# Patient Record
Sex: Female | Born: 1950 | Race: Black or African American | Hispanic: No | Smoking: Former smoker
Health system: Southern US, Community
[De-identification: ages and names within clinical notes are randomized; demographics above are authoritative.]

## PROBLEM LIST (undated history)

## (undated) DIAGNOSIS — M47816 Spondylosis without myelopathy or radiculopathy, lumbar region: Secondary | ICD-10-CM

## (undated) DIAGNOSIS — I4819 Other persistent atrial fibrillation: Secondary | ICD-10-CM

## (undated) DIAGNOSIS — K259 Gastric ulcer, unspecified as acute or chronic, without hemorrhage or perforation: Secondary | ICD-10-CM

## (undated) DIAGNOSIS — K635 Polyp of colon: Secondary | ICD-10-CM

## (undated) DIAGNOSIS — E785 Hyperlipidemia, unspecified: Secondary | ICD-10-CM

## (undated) DIAGNOSIS — K5790 Diverticulosis of intestine, part unspecified, without perforation or abscess without bleeding: Secondary | ICD-10-CM

## (undated) DIAGNOSIS — K529 Noninfective gastroenteritis and colitis, unspecified: Secondary | ICD-10-CM

## (undated) DIAGNOSIS — N281 Cyst of kidney, acquired: Secondary | ICD-10-CM

## (undated) DIAGNOSIS — E669 Obesity, unspecified: Secondary | ICD-10-CM

## (undated) DIAGNOSIS — I2510 Atherosclerotic heart disease of native coronary artery without angina pectoris: Secondary | ICD-10-CM

## (undated) DIAGNOSIS — D509 Iron deficiency anemia, unspecified: Secondary | ICD-10-CM

## (undated) DIAGNOSIS — I10 Essential (primary) hypertension: Secondary | ICD-10-CM

## (undated) DIAGNOSIS — M1990 Unspecified osteoarthritis, unspecified site: Secondary | ICD-10-CM

## (undated) DIAGNOSIS — K439 Ventral hernia without obstruction or gangrene: Secondary | ICD-10-CM

## (undated) DIAGNOSIS — I499 Cardiac arrhythmia, unspecified: Secondary | ICD-10-CM

## (undated) HISTORY — DX: Cyst of kidney, acquired: N28.1

## (undated) HISTORY — DX: Ventral hernia without obstruction or gangrene: K43.9

## (undated) HISTORY — DX: Hyperlipidemia, unspecified: E78.5

## (undated) HISTORY — PX: OPERATIVE HYSTEROSCOPY: SUR664

## (undated) HISTORY — DX: Atherosclerotic heart disease of native coronary artery without angina pectoris: I25.10

## (undated) HISTORY — DX: Gastric ulcer, unspecified as acute or chronic, without hemorrhage or perforation: K25.9

## (undated) HISTORY — DX: Polyp of colon: K63.5

## (undated) HISTORY — DX: Diverticulosis of intestine, part unspecified, without perforation or abscess without bleeding: K57.90

## (undated) HISTORY — DX: Noninfective gastroenteritis and colitis, unspecified: K52.9

## (undated) HISTORY — DX: Essential (primary) hypertension: I10

## (undated) HISTORY — DX: Iron deficiency anemia, unspecified: D50.9

## (undated) HISTORY — DX: Unspecified osteoarthritis, unspecified site: M19.90

## (undated) HISTORY — DX: Spondylosis without myelopathy or radiculopathy, lumbar region: M47.816

## (undated) HISTORY — PX: UTERINE FIBROID SURGERY: SHX826

## (undated) HISTORY — PX: TUBAL LIGATION: SHX77

## (undated) HISTORY — DX: Other persistent atrial fibrillation: I48.19

## (undated) HISTORY — PX: ABDOMINAL HYSTERECTOMY: SHX81

---

## 1998-03-21 ENCOUNTER — Encounter: Admission: RE | Admit: 1998-03-21 | Discharge: 1998-03-21 | Payer: Self-pay | Admitting: Family Medicine

## 1998-04-19 ENCOUNTER — Encounter: Admission: RE | Admit: 1998-04-19 | Discharge: 1998-04-19 | Payer: Self-pay | Admitting: Family Medicine

## 1998-04-26 ENCOUNTER — Encounter: Admission: RE | Admit: 1998-04-26 | Discharge: 1998-04-26 | Payer: Self-pay | Admitting: Family Medicine

## 1998-05-17 ENCOUNTER — Encounter: Admission: RE | Admit: 1998-05-17 | Discharge: 1998-05-17 | Payer: Self-pay | Admitting: Family Medicine

## 1998-06-29 ENCOUNTER — Encounter: Admission: RE | Admit: 1998-06-29 | Discharge: 1998-06-29 | Payer: Self-pay | Admitting: Family Medicine

## 1998-06-29 ENCOUNTER — Other Ambulatory Visit: Admission: RE | Admit: 1998-06-29 | Discharge: 1998-06-29 | Payer: Self-pay | Admitting: *Deleted

## 1998-06-30 ENCOUNTER — Encounter: Admission: RE | Admit: 1998-06-30 | Discharge: 1998-06-30 | Payer: Self-pay | Admitting: Sports Medicine

## 1998-07-03 ENCOUNTER — Encounter: Admission: RE | Admit: 1998-07-03 | Discharge: 1998-07-03 | Payer: Self-pay | Admitting: Family Medicine

## 1998-08-21 ENCOUNTER — Encounter: Admission: RE | Admit: 1998-08-21 | Discharge: 1998-08-21 | Payer: Self-pay | Admitting: Family Medicine

## 1999-03-03 ENCOUNTER — Emergency Department (HOSPITAL_COMMUNITY): Admission: EM | Admit: 1999-03-03 | Discharge: 1999-03-04 | Payer: Self-pay | Admitting: Emergency Medicine

## 1999-03-16 ENCOUNTER — Encounter: Admission: RE | Admit: 1999-03-16 | Discharge: 1999-03-16 | Payer: Self-pay | Admitting: Family Medicine

## 1999-04-06 ENCOUNTER — Encounter: Admission: RE | Admit: 1999-04-06 | Discharge: 1999-04-06 | Payer: Self-pay | Admitting: Family Medicine

## 1999-04-27 ENCOUNTER — Encounter: Admission: RE | Admit: 1999-04-27 | Discharge: 1999-04-27 | Payer: Self-pay | Admitting: Family Medicine

## 1999-05-23 ENCOUNTER — Encounter: Admission: RE | Admit: 1999-05-23 | Discharge: 1999-05-23 | Payer: Self-pay | Admitting: Family Medicine

## 1999-07-18 ENCOUNTER — Encounter: Admission: RE | Admit: 1999-07-18 | Discharge: 1999-07-18 | Payer: Self-pay | Admitting: Family Medicine

## 1999-08-17 ENCOUNTER — Encounter: Admission: RE | Admit: 1999-08-17 | Discharge: 1999-08-17 | Payer: Self-pay | Admitting: Family Medicine

## 1999-09-07 ENCOUNTER — Encounter: Admission: RE | Admit: 1999-09-07 | Discharge: 1999-09-07 | Payer: Self-pay | Admitting: Family Medicine

## 1999-11-16 ENCOUNTER — Encounter: Admission: RE | Admit: 1999-11-16 | Discharge: 1999-11-16 | Payer: Self-pay | Admitting: Family Medicine

## 1999-12-07 ENCOUNTER — Encounter: Admission: RE | Admit: 1999-12-07 | Discharge: 1999-12-07 | Payer: Self-pay | Admitting: Family Medicine

## 1999-12-07 ENCOUNTER — Ambulatory Visit (HOSPITAL_COMMUNITY): Admission: RE | Admit: 1999-12-07 | Discharge: 1999-12-07 | Payer: Self-pay | Admitting: Family Medicine

## 1999-12-21 ENCOUNTER — Encounter: Admission: RE | Admit: 1999-12-21 | Discharge: 1999-12-21 | Payer: Self-pay | Admitting: Family Medicine

## 2000-04-03 ENCOUNTER — Encounter: Admission: RE | Admit: 2000-04-03 | Discharge: 2000-04-03 | Payer: Self-pay | Admitting: Sports Medicine

## 2000-04-03 ENCOUNTER — Encounter: Admission: RE | Admit: 2000-04-03 | Discharge: 2000-04-03 | Payer: Self-pay | Admitting: Family Medicine

## 2000-04-06 ENCOUNTER — Encounter: Payer: Self-pay | Admitting: Family Medicine

## 2000-04-06 ENCOUNTER — Encounter: Payer: Self-pay | Admitting: Emergency Medicine

## 2000-04-06 ENCOUNTER — Inpatient Hospital Stay (HOSPITAL_COMMUNITY): Admission: EM | Admit: 2000-04-06 | Discharge: 2000-04-09 | Payer: Self-pay | Admitting: Emergency Medicine

## 2000-04-08 DIAGNOSIS — K259 Gastric ulcer, unspecified as acute or chronic, without hemorrhage or perforation: Secondary | ICD-10-CM

## 2000-04-08 HISTORY — DX: Gastric ulcer, unspecified as acute or chronic, without hemorrhage or perforation: K25.9

## 2000-04-11 ENCOUNTER — Encounter: Admission: RE | Admit: 2000-04-11 | Discharge: 2000-07-10 | Payer: Self-pay | Admitting: Emergency Medicine

## 2000-04-17 ENCOUNTER — Encounter: Admission: RE | Admit: 2000-04-17 | Discharge: 2000-04-17 | Payer: Self-pay | Admitting: Family Medicine

## 2000-04-18 ENCOUNTER — Encounter: Admission: RE | Admit: 2000-04-18 | Discharge: 2000-04-18 | Payer: Self-pay | Admitting: Family Medicine

## 2000-04-21 ENCOUNTER — Encounter: Admission: RE | Admit: 2000-04-21 | Discharge: 2000-04-21 | Payer: Self-pay | Admitting: Family Medicine

## 2000-04-21 ENCOUNTER — Emergency Department (HOSPITAL_COMMUNITY): Admission: EM | Admit: 2000-04-21 | Discharge: 2000-04-21 | Payer: Self-pay | Admitting: Emergency Medicine

## 2000-04-22 ENCOUNTER — Encounter: Admission: RE | Admit: 2000-04-22 | Discharge: 2000-04-22 | Payer: Self-pay | Admitting: Family Medicine

## 2000-04-23 ENCOUNTER — Encounter: Admission: RE | Admit: 2000-04-23 | Discharge: 2000-04-23 | Payer: Self-pay | Admitting: Family Medicine

## 2000-04-23 ENCOUNTER — Emergency Department (HOSPITAL_COMMUNITY): Admission: EM | Admit: 2000-04-23 | Discharge: 2000-04-23 | Payer: Self-pay | Admitting: Emergency Medicine

## 2000-04-28 ENCOUNTER — Encounter: Payer: Self-pay | Admitting: Emergency Medicine

## 2000-04-28 ENCOUNTER — Inpatient Hospital Stay (HOSPITAL_COMMUNITY): Admission: EM | Admit: 2000-04-28 | Discharge: 2000-04-29 | Payer: Self-pay | Admitting: Emergency Medicine

## 2000-04-28 ENCOUNTER — Encounter (INDEPENDENT_AMBULATORY_CARE_PROVIDER_SITE_OTHER): Payer: Self-pay | Admitting: *Deleted

## 2000-04-29 ENCOUNTER — Encounter: Payer: Self-pay | Admitting: Internal Medicine

## 2000-05-01 ENCOUNTER — Encounter: Admission: RE | Admit: 2000-05-01 | Discharge: 2000-05-01 | Payer: Self-pay | Admitting: Sports Medicine

## 2000-05-01 ENCOUNTER — Encounter: Payer: Self-pay | Admitting: Sports Medicine

## 2000-05-07 ENCOUNTER — Encounter: Admission: RE | Admit: 2000-05-07 | Discharge: 2000-05-07 | Payer: Self-pay | Admitting: Family Medicine

## 2000-05-13 ENCOUNTER — Encounter: Admission: RE | Admit: 2000-05-13 | Discharge: 2000-05-13 | Payer: Self-pay | Admitting: Family Medicine

## 2000-05-20 ENCOUNTER — Encounter: Admission: RE | Admit: 2000-05-20 | Discharge: 2000-05-20 | Payer: Self-pay | Admitting: Family Medicine

## 2000-05-29 ENCOUNTER — Encounter: Admission: RE | Admit: 2000-05-29 | Discharge: 2000-05-29 | Payer: Self-pay | Admitting: Family Medicine

## 2000-06-12 ENCOUNTER — Encounter: Admission: RE | Admit: 2000-06-12 | Discharge: 2000-06-12 | Payer: Self-pay | Admitting: Family Medicine

## 2000-07-09 ENCOUNTER — Encounter (INDEPENDENT_AMBULATORY_CARE_PROVIDER_SITE_OTHER): Payer: Self-pay | Admitting: *Deleted

## 2000-07-15 ENCOUNTER — Encounter: Admission: RE | Admit: 2000-07-15 | Discharge: 2000-07-15 | Payer: Self-pay | Admitting: Family Medicine

## 2000-09-21 ENCOUNTER — Encounter: Payer: Self-pay | Admitting: Emergency Medicine

## 2000-09-21 ENCOUNTER — Inpatient Hospital Stay (HOSPITAL_COMMUNITY): Admission: EM | Admit: 2000-09-21 | Discharge: 2000-09-22 | Payer: Self-pay | Admitting: Emergency Medicine

## 2000-11-19 ENCOUNTER — Encounter: Admission: RE | Admit: 2000-11-19 | Discharge: 2000-11-19 | Payer: Self-pay | Admitting: Family Medicine

## 2001-01-27 ENCOUNTER — Encounter: Admission: RE | Admit: 2001-01-27 | Discharge: 2001-01-27 | Payer: Self-pay | Admitting: Family Medicine

## 2001-04-14 ENCOUNTER — Encounter: Admission: RE | Admit: 2001-04-14 | Discharge: 2001-04-14 | Payer: Self-pay | Admitting: Family Medicine

## 2001-05-13 ENCOUNTER — Encounter: Admission: RE | Admit: 2001-05-13 | Discharge: 2001-05-13 | Payer: Self-pay | Admitting: Family Medicine

## 2001-05-14 ENCOUNTER — Encounter: Payer: Self-pay | Admitting: Sports Medicine

## 2001-05-14 ENCOUNTER — Encounter: Admission: RE | Admit: 2001-05-14 | Discharge: 2001-05-14 | Payer: Self-pay | Admitting: Sports Medicine

## 2001-05-19 ENCOUNTER — Encounter: Admission: RE | Admit: 2001-05-19 | Discharge: 2001-05-19 | Payer: Self-pay | Admitting: Sports Medicine

## 2001-05-19 ENCOUNTER — Encounter: Payer: Self-pay | Admitting: Sports Medicine

## 2001-05-22 ENCOUNTER — Encounter: Admission: RE | Admit: 2001-05-22 | Discharge: 2001-05-22 | Payer: Self-pay | Admitting: Family Medicine

## 2001-05-27 ENCOUNTER — Encounter: Payer: Self-pay | Admitting: Family Medicine

## 2001-05-27 ENCOUNTER — Encounter: Admission: RE | Admit: 2001-05-27 | Discharge: 2001-05-27 | Payer: Self-pay | Admitting: Family Medicine

## 2001-06-16 ENCOUNTER — Encounter: Admission: RE | Admit: 2001-06-16 | Discharge: 2001-06-16 | Payer: Self-pay | Admitting: Sports Medicine

## 2001-07-08 ENCOUNTER — Other Ambulatory Visit: Admission: RE | Admit: 2001-07-08 | Discharge: 2001-07-08 | Payer: Self-pay | Admitting: *Deleted

## 2001-07-19 ENCOUNTER — Encounter (INDEPENDENT_AMBULATORY_CARE_PROVIDER_SITE_OTHER): Payer: Self-pay | Admitting: *Deleted

## 2001-07-19 ENCOUNTER — Emergency Department (HOSPITAL_COMMUNITY): Admission: EM | Admit: 2001-07-19 | Discharge: 2001-07-20 | Payer: Self-pay | Admitting: Emergency Medicine

## 2001-07-31 ENCOUNTER — Encounter: Admission: RE | Admit: 2001-07-31 | Discharge: 2001-07-31 | Payer: Self-pay | Admitting: Family Medicine

## 2001-08-02 ENCOUNTER — Emergency Department (HOSPITAL_COMMUNITY): Admission: EM | Admit: 2001-08-02 | Discharge: 2001-08-02 | Payer: Self-pay | Admitting: Emergency Medicine

## 2001-08-05 ENCOUNTER — Encounter (INDEPENDENT_AMBULATORY_CARE_PROVIDER_SITE_OTHER): Payer: Self-pay | Admitting: *Deleted

## 2001-08-05 ENCOUNTER — Encounter: Admission: RE | Admit: 2001-08-05 | Discharge: 2001-08-05 | Payer: Self-pay | Admitting: Family Medicine

## 2001-08-05 ENCOUNTER — Encounter: Payer: Self-pay | Admitting: Family Medicine

## 2001-09-14 ENCOUNTER — Emergency Department (HOSPITAL_COMMUNITY): Admission: EM | Admit: 2001-09-14 | Discharge: 2001-09-15 | Payer: Self-pay | Admitting: *Deleted

## 2001-09-14 ENCOUNTER — Encounter: Payer: Self-pay | Admitting: Emergency Medicine

## 2001-09-14 ENCOUNTER — Encounter (INDEPENDENT_AMBULATORY_CARE_PROVIDER_SITE_OTHER): Payer: Self-pay | Admitting: *Deleted

## 2001-10-16 ENCOUNTER — Encounter: Admission: RE | Admit: 2001-10-16 | Discharge: 2001-10-16 | Payer: Self-pay | Admitting: Family Medicine

## 2001-11-03 ENCOUNTER — Encounter: Admission: RE | Admit: 2001-11-03 | Discharge: 2001-11-03 | Payer: Self-pay | Admitting: Pediatrics

## 2001-11-10 ENCOUNTER — Encounter: Admission: RE | Admit: 2001-11-10 | Discharge: 2001-11-10 | Payer: Self-pay | Admitting: Family Medicine

## 2001-12-07 ENCOUNTER — Inpatient Hospital Stay (HOSPITAL_COMMUNITY): Admission: AD | Admit: 2001-12-07 | Discharge: 2001-12-07 | Payer: Self-pay | Admitting: Obstetrics & Gynecology

## 2001-12-07 ENCOUNTER — Encounter: Admission: RE | Admit: 2001-12-07 | Discharge: 2001-12-07 | Payer: Self-pay | Admitting: Family Medicine

## 2001-12-10 ENCOUNTER — Encounter: Admission: RE | Admit: 2001-12-10 | Discharge: 2001-12-10 | Payer: Self-pay | Admitting: Family Medicine

## 2001-12-24 ENCOUNTER — Encounter: Admission: RE | Admit: 2001-12-24 | Discharge: 2001-12-24 | Payer: Self-pay | Admitting: Family Medicine

## 2002-01-25 ENCOUNTER — Encounter: Admission: RE | Admit: 2002-01-25 | Discharge: 2002-01-25 | Payer: Self-pay | Admitting: Family Medicine

## 2002-02-25 ENCOUNTER — Encounter: Admission: RE | Admit: 2002-02-25 | Discharge: 2002-02-25 | Payer: Self-pay | Admitting: Family Medicine

## 2002-03-04 ENCOUNTER — Encounter: Admission: RE | Admit: 2002-03-04 | Discharge: 2002-03-04 | Payer: Self-pay | Admitting: Family Medicine

## 2002-05-13 ENCOUNTER — Encounter: Admission: RE | Admit: 2002-05-13 | Discharge: 2002-05-13 | Payer: Self-pay | Admitting: Family Medicine

## 2002-05-28 ENCOUNTER — Encounter: Payer: Self-pay | Admitting: Sports Medicine

## 2002-05-28 ENCOUNTER — Encounter: Admission: RE | Admit: 2002-05-28 | Discharge: 2002-05-28 | Payer: Self-pay | Admitting: Sports Medicine

## 2002-06-16 ENCOUNTER — Encounter (INDEPENDENT_AMBULATORY_CARE_PROVIDER_SITE_OTHER): Payer: Self-pay | Admitting: *Deleted

## 2002-06-16 ENCOUNTER — Inpatient Hospital Stay (HOSPITAL_COMMUNITY): Admission: EM | Admit: 2002-06-16 | Discharge: 2002-06-19 | Payer: Self-pay | Admitting: Emergency Medicine

## 2002-06-16 ENCOUNTER — Encounter: Payer: Self-pay | Admitting: Emergency Medicine

## 2002-06-18 ENCOUNTER — Encounter: Payer: Self-pay | Admitting: Family Medicine

## 2002-07-05 ENCOUNTER — Encounter: Admission: RE | Admit: 2002-07-05 | Discharge: 2002-07-05 | Payer: Self-pay | Admitting: Family Medicine

## 2002-07-09 ENCOUNTER — Encounter: Admission: RE | Admit: 2002-07-09 | Discharge: 2002-07-09 | Payer: Self-pay | Admitting: Sports Medicine

## 2002-07-09 ENCOUNTER — Encounter: Payer: Self-pay | Admitting: Sports Medicine

## 2002-09-28 ENCOUNTER — Encounter: Admission: RE | Admit: 2002-09-28 | Discharge: 2002-09-28 | Payer: Self-pay | Admitting: Sports Medicine

## 2002-10-27 ENCOUNTER — Encounter: Admission: RE | Admit: 2002-10-27 | Discharge: 2002-10-27 | Payer: Self-pay | Admitting: Family Medicine

## 2002-10-29 ENCOUNTER — Emergency Department (HOSPITAL_COMMUNITY): Admission: EM | Admit: 2002-10-29 | Discharge: 2002-10-29 | Payer: Self-pay | Admitting: Emergency Medicine

## 2002-10-31 ENCOUNTER — Emergency Department (HOSPITAL_COMMUNITY): Admission: EM | Admit: 2002-10-31 | Discharge: 2002-10-31 | Payer: Self-pay | Admitting: Emergency Medicine

## 2002-10-31 ENCOUNTER — Encounter: Payer: Self-pay | Admitting: Emergency Medicine

## 2002-11-09 ENCOUNTER — Encounter: Admission: RE | Admit: 2002-11-09 | Discharge: 2002-11-09 | Payer: Self-pay | Admitting: Sports Medicine

## 2002-11-16 ENCOUNTER — Encounter: Admission: RE | Admit: 2002-11-16 | Discharge: 2002-11-16 | Payer: Self-pay | Admitting: Sports Medicine

## 2002-11-23 ENCOUNTER — Encounter: Admission: RE | Admit: 2002-11-23 | Discharge: 2002-11-23 | Payer: Self-pay | Admitting: Family Medicine

## 2002-11-26 ENCOUNTER — Encounter: Payer: Self-pay | Admitting: Emergency Medicine

## 2002-11-26 ENCOUNTER — Emergency Department (HOSPITAL_COMMUNITY): Admission: EM | Admit: 2002-11-26 | Discharge: 2002-11-26 | Payer: Self-pay | Admitting: Emergency Medicine

## 2002-12-10 ENCOUNTER — Encounter: Admission: RE | Admit: 2002-12-10 | Discharge: 2002-12-10 | Payer: Self-pay | Admitting: Sports Medicine

## 2002-12-23 ENCOUNTER — Encounter: Admission: RE | Admit: 2002-12-23 | Discharge: 2002-12-23 | Payer: Self-pay | Admitting: Family Medicine

## 2002-12-30 ENCOUNTER — Encounter: Admission: RE | Admit: 2002-12-30 | Discharge: 2002-12-30 | Payer: Self-pay | Admitting: Family Medicine

## 2003-01-30 ENCOUNTER — Emergency Department (HOSPITAL_COMMUNITY): Admission: EM | Admit: 2003-01-30 | Discharge: 2003-01-30 | Payer: Self-pay | Admitting: Emergency Medicine

## 2003-03-11 ENCOUNTER — Encounter: Admission: RE | Admit: 2003-03-11 | Discharge: 2003-03-11 | Payer: Self-pay | Admitting: Family Medicine

## 2003-04-08 ENCOUNTER — Encounter: Admission: RE | Admit: 2003-04-08 | Discharge: 2003-04-08 | Payer: Self-pay | Admitting: Family Medicine

## 2003-04-22 ENCOUNTER — Encounter: Admission: RE | Admit: 2003-04-22 | Discharge: 2003-04-22 | Payer: Self-pay | Admitting: Family Medicine

## 2003-05-06 ENCOUNTER — Encounter: Admission: RE | Admit: 2003-05-06 | Discharge: 2003-05-06 | Payer: Self-pay | Admitting: Family Medicine

## 2003-05-16 ENCOUNTER — Encounter: Admission: RE | Admit: 2003-05-16 | Discharge: 2003-05-16 | Payer: Self-pay | Admitting: Sports Medicine

## 2003-06-03 ENCOUNTER — Encounter: Payer: Self-pay | Admitting: Sports Medicine

## 2003-06-03 ENCOUNTER — Encounter: Admission: RE | Admit: 2003-06-03 | Discharge: 2003-06-03 | Payer: Self-pay | Admitting: Sports Medicine

## 2003-10-15 ENCOUNTER — Emergency Department (HOSPITAL_COMMUNITY): Admission: EM | Admit: 2003-10-15 | Discharge: 2003-10-15 | Payer: Self-pay | Admitting: Emergency Medicine

## 2003-11-12 ENCOUNTER — Emergency Department (HOSPITAL_COMMUNITY): Admission: AD | Admit: 2003-11-12 | Discharge: 2003-11-12 | Payer: Self-pay | Admitting: Family Medicine

## 2003-11-25 ENCOUNTER — Encounter: Admission: RE | Admit: 2003-11-25 | Discharge: 2003-11-25 | Payer: Self-pay | Admitting: Sports Medicine

## 2003-12-16 ENCOUNTER — Encounter: Admission: RE | Admit: 2003-12-16 | Discharge: 2003-12-16 | Payer: Self-pay | Admitting: Family Medicine

## 2004-03-08 ENCOUNTER — Encounter: Admission: RE | Admit: 2004-03-08 | Discharge: 2004-03-08 | Payer: Self-pay | Admitting: Family Medicine

## 2004-05-12 ENCOUNTER — Emergency Department (HOSPITAL_COMMUNITY): Admission: EM | Admit: 2004-05-12 | Discharge: 2004-05-12 | Payer: Self-pay | Admitting: Emergency Medicine

## 2004-07-16 ENCOUNTER — Encounter: Admission: RE | Admit: 2004-07-16 | Discharge: 2004-07-16 | Payer: Self-pay | Admitting: Family Medicine

## 2004-07-25 ENCOUNTER — Encounter: Admission: RE | Admit: 2004-07-25 | Discharge: 2004-07-25 | Payer: Self-pay | Admitting: Sports Medicine

## 2004-08-08 ENCOUNTER — Encounter: Admission: RE | Admit: 2004-08-08 | Discharge: 2004-08-08 | Payer: Self-pay | Admitting: Family Medicine

## 2004-08-20 ENCOUNTER — Encounter: Admission: RE | Admit: 2004-08-20 | Discharge: 2004-08-20 | Payer: Self-pay | Admitting: Sports Medicine

## 2004-09-05 ENCOUNTER — Ambulatory Visit: Payer: Self-pay | Admitting: Sports Medicine

## 2004-09-12 ENCOUNTER — Inpatient Hospital Stay (HOSPITAL_COMMUNITY): Admission: EM | Admit: 2004-09-12 | Discharge: 2004-09-14 | Payer: Self-pay | Admitting: Emergency Medicine

## 2004-09-26 ENCOUNTER — Emergency Department (HOSPITAL_COMMUNITY): Admission: EM | Admit: 2004-09-26 | Discharge: 2004-09-27 | Payer: Self-pay | Admitting: Emergency Medicine

## 2004-10-04 ENCOUNTER — Ambulatory Visit: Payer: Self-pay | Admitting: Family Medicine

## 2004-10-18 ENCOUNTER — Ambulatory Visit: Payer: Self-pay | Admitting: Family Medicine

## 2004-10-30 ENCOUNTER — Ambulatory Visit: Payer: Self-pay | Admitting: Family Medicine

## 2004-11-16 ENCOUNTER — Ambulatory Visit: Payer: Self-pay | Admitting: Sports Medicine

## 2004-12-20 ENCOUNTER — Ambulatory Visit: Payer: Self-pay | Admitting: Family Medicine

## 2005-02-04 ENCOUNTER — Ambulatory Visit: Payer: Self-pay | Admitting: Sports Medicine

## 2005-03-09 ENCOUNTER — Emergency Department (HOSPITAL_COMMUNITY): Admission: EM | Admit: 2005-03-09 | Discharge: 2005-03-09 | Payer: Self-pay | Admitting: Emergency Medicine

## 2005-04-12 ENCOUNTER — Ambulatory Visit: Payer: Self-pay | Admitting: Family Medicine

## 2005-05-15 ENCOUNTER — Ambulatory Visit: Payer: Self-pay | Admitting: Family Medicine

## 2005-06-25 ENCOUNTER — Ambulatory Visit: Payer: Self-pay | Admitting: Family Medicine

## 2005-07-11 ENCOUNTER — Emergency Department (HOSPITAL_COMMUNITY): Admission: EM | Admit: 2005-07-11 | Discharge: 2005-07-11 | Payer: Self-pay | Admitting: Emergency Medicine

## 2005-10-18 ENCOUNTER — Ambulatory Visit: Payer: Self-pay | Admitting: Family Medicine

## 2005-11-04 ENCOUNTER — Encounter: Admission: RE | Admit: 2005-11-04 | Discharge: 2005-11-04 | Payer: Self-pay | Admitting: Sports Medicine

## 2006-03-10 ENCOUNTER — Ambulatory Visit: Payer: Self-pay | Admitting: Sports Medicine

## 2006-04-11 ENCOUNTER — Ambulatory Visit: Payer: Self-pay | Admitting: Family Medicine

## 2006-04-16 ENCOUNTER — Ambulatory Visit: Payer: Self-pay | Admitting: Sports Medicine

## 2006-05-22 ENCOUNTER — Encounter: Admission: RE | Admit: 2006-05-22 | Discharge: 2006-05-22 | Payer: Self-pay | Admitting: Family Medicine

## 2006-08-24 ENCOUNTER — Emergency Department (HOSPITAL_COMMUNITY): Admission: EM | Admit: 2006-08-24 | Discharge: 2006-08-24 | Payer: Self-pay | Admitting: Emergency Medicine

## 2006-11-07 ENCOUNTER — Encounter: Admission: RE | Admit: 2006-11-07 | Discharge: 2006-11-07 | Payer: Self-pay | Admitting: Sports Medicine

## 2007-01-09 ENCOUNTER — Ambulatory Visit: Payer: Self-pay | Admitting: Family Medicine

## 2007-01-30 ENCOUNTER — Ambulatory Visit: Payer: Self-pay | Admitting: Family Medicine

## 2007-02-05 DIAGNOSIS — K219 Gastro-esophageal reflux disease without esophagitis: Secondary | ICD-10-CM

## 2007-02-05 DIAGNOSIS — I10 Essential (primary) hypertension: Secondary | ICD-10-CM | POA: Insufficient documentation

## 2007-02-05 DIAGNOSIS — E669 Obesity, unspecified: Secondary | ICD-10-CM

## 2007-02-05 DIAGNOSIS — E7800 Pure hypercholesterolemia, unspecified: Secondary | ICD-10-CM

## 2007-02-05 DIAGNOSIS — D509 Iron deficiency anemia, unspecified: Secondary | ICD-10-CM

## 2007-02-05 DIAGNOSIS — M1990 Unspecified osteoarthritis, unspecified site: Secondary | ICD-10-CM

## 2007-02-05 DIAGNOSIS — F339 Major depressive disorder, recurrent, unspecified: Secondary | ICD-10-CM | POA: Insufficient documentation

## 2007-02-05 DIAGNOSIS — E119 Type 2 diabetes mellitus without complications: Secondary | ICD-10-CM | POA: Insufficient documentation

## 2007-02-05 HISTORY — DX: Iron deficiency anemia, unspecified: D50.9

## 2007-02-06 ENCOUNTER — Encounter (INDEPENDENT_AMBULATORY_CARE_PROVIDER_SITE_OTHER): Payer: Self-pay | Admitting: *Deleted

## 2007-03-13 ENCOUNTER — Ambulatory Visit: Payer: Self-pay | Admitting: Family Medicine

## 2007-04-24 ENCOUNTER — Ambulatory Visit: Payer: Self-pay | Admitting: Family Medicine

## 2007-05-21 ENCOUNTER — Encounter: Payer: Self-pay | Admitting: Family Medicine

## 2007-05-23 ENCOUNTER — Emergency Department (HOSPITAL_COMMUNITY): Admission: EM | Admit: 2007-05-23 | Discharge: 2007-05-23 | Payer: Self-pay | Admitting: Emergency Medicine

## 2007-05-25 ENCOUNTER — Encounter: Payer: Self-pay | Admitting: *Deleted

## 2007-09-11 ENCOUNTER — Telehealth: Payer: Self-pay | Admitting: *Deleted

## 2007-12-11 ENCOUNTER — Ambulatory Visit: Payer: Self-pay | Admitting: Family Medicine

## 2007-12-11 ENCOUNTER — Encounter: Payer: Self-pay | Admitting: Family Medicine

## 2007-12-11 LAB — CONVERTED CEMR LAB
ALT: 10 units/L (ref 0–35)
AST: 10 units/L (ref 0–37)
Alkaline Phosphatase: 57 units/L (ref 39–117)
Bilirubin Urine: NEGATIVE
CO2: 28 meq/L (ref 19–32)
Calcium: 9.8 mg/dL (ref 8.4–10.5)
Glucose, Bld: 258 mg/dL — ABNORMAL HIGH (ref 70–99)
HDL: 55 mg/dL (ref >39)
Ketones, urine, test strip: NEGATIVE
Potassium: 3.9 meq/L (ref 3.5–5.3)
Protein, U semiquant: 30
Specific Gravity, Urine: 1.02
Total Bilirubin: 0.5 mg/dL (ref 0.3–1.2)
Total Protein: 7.3 g/dL (ref 6.0–8.3)
Triglycerides: 106 mg/dL (ref <150)
Urinalysis: ABNORMAL

## 2007-12-14 ENCOUNTER — Telehealth: Payer: Self-pay | Admitting: Family Medicine

## 2007-12-22 ENCOUNTER — Telehealth (INDEPENDENT_AMBULATORY_CARE_PROVIDER_SITE_OTHER): Payer: Self-pay | Admitting: *Deleted

## 2007-12-22 ENCOUNTER — Emergency Department (HOSPITAL_COMMUNITY): Admission: EM | Admit: 2007-12-22 | Discharge: 2007-12-23 | Payer: Self-pay | Admitting: Emergency Medicine

## 2007-12-25 ENCOUNTER — Ambulatory Visit: Payer: Self-pay | Admitting: Family Medicine

## 2008-01-01 ENCOUNTER — Ambulatory Visit: Payer: Self-pay | Admitting: Family Medicine

## 2008-01-01 LAB — CONVERTED CEMR LAB

## 2008-01-07 ENCOUNTER — Telehealth: Payer: Self-pay | Admitting: *Deleted

## 2008-01-12 ENCOUNTER — Telehealth: Payer: Self-pay | Admitting: *Deleted

## 2008-01-18 ENCOUNTER — Encounter (INDEPENDENT_AMBULATORY_CARE_PROVIDER_SITE_OTHER): Payer: Self-pay | Admitting: Family Medicine

## 2008-01-18 ENCOUNTER — Ambulatory Visit: Payer: Self-pay | Admitting: Sports Medicine

## 2008-01-28 ENCOUNTER — Emergency Department (HOSPITAL_COMMUNITY): Admission: EM | Admit: 2008-01-28 | Discharge: 2008-01-29 | Payer: Self-pay | Admitting: Emergency Medicine

## 2008-01-28 ENCOUNTER — Encounter (INDEPENDENT_AMBULATORY_CARE_PROVIDER_SITE_OTHER): Payer: Self-pay | Admitting: *Deleted

## 2008-01-29 ENCOUNTER — Encounter (INDEPENDENT_AMBULATORY_CARE_PROVIDER_SITE_OTHER): Payer: Self-pay | Admitting: *Deleted

## 2008-02-02 ENCOUNTER — Ambulatory Visit: Payer: Self-pay | Admitting: Family Medicine

## 2008-02-02 ENCOUNTER — Observation Stay (HOSPITAL_COMMUNITY): Admission: EM | Admit: 2008-02-02 | Discharge: 2008-02-02 | Payer: Self-pay | Admitting: Emergency Medicine

## 2008-02-09 ENCOUNTER — Ambulatory Visit: Payer: Self-pay | Admitting: Family Medicine

## 2008-02-12 ENCOUNTER — Encounter: Payer: Self-pay | Admitting: *Deleted

## 2008-02-15 ENCOUNTER — Telehealth: Payer: Self-pay | Admitting: *Deleted

## 2008-02-18 ENCOUNTER — Telehealth: Payer: Self-pay | Admitting: *Deleted

## 2008-02-22 ENCOUNTER — Encounter: Payer: Self-pay | Admitting: Family Medicine

## 2008-02-24 ENCOUNTER — Telehealth: Payer: Self-pay | Admitting: *Deleted

## 2008-02-26 ENCOUNTER — Ambulatory Visit (HOSPITAL_COMMUNITY): Admission: RE | Admit: 2008-02-26 | Discharge: 2008-02-26 | Payer: Self-pay | Admitting: Family Medicine

## 2008-02-26 ENCOUNTER — Encounter (INDEPENDENT_AMBULATORY_CARE_PROVIDER_SITE_OTHER): Payer: Self-pay | Admitting: *Deleted

## 2008-02-29 ENCOUNTER — Telehealth: Payer: Self-pay | Admitting: *Deleted

## 2008-03-01 ENCOUNTER — Telehealth: Payer: Self-pay | Admitting: *Deleted

## 2008-03-11 ENCOUNTER — Ambulatory Visit: Payer: Self-pay | Admitting: Family Medicine

## 2008-03-11 LAB — CONVERTED CEMR LAB: Blood Glucose, Fingerstick: 222

## 2008-03-25 ENCOUNTER — Encounter: Admission: RE | Admit: 2008-03-25 | Discharge: 2008-03-25 | Payer: Self-pay | Admitting: Family Medicine

## 2008-04-29 ENCOUNTER — Ambulatory Visit: Payer: Self-pay | Admitting: Family Medicine

## 2008-04-29 LAB — CONVERTED CEMR LAB: Hgb A1c MFr Bld: 6.8 %

## 2008-05-18 ENCOUNTER — Telehealth: Payer: Self-pay | Admitting: Family Medicine

## 2008-07-08 ENCOUNTER — Encounter: Payer: Self-pay | Admitting: Family Medicine

## 2008-07-08 ENCOUNTER — Ambulatory Visit: Payer: Self-pay | Admitting: Family Medicine

## 2008-07-25 ENCOUNTER — Telehealth: Payer: Self-pay | Admitting: *Deleted

## 2008-09-18 ENCOUNTER — Emergency Department (HOSPITAL_COMMUNITY): Admission: EM | Admit: 2008-09-18 | Discharge: 2008-09-18 | Payer: Self-pay | Admitting: Emergency Medicine

## 2008-10-09 ENCOUNTER — Emergency Department (HOSPITAL_COMMUNITY): Admission: EM | Admit: 2008-10-09 | Discharge: 2008-10-09 | Payer: Self-pay | Admitting: Emergency Medicine

## 2008-10-09 ENCOUNTER — Encounter (INDEPENDENT_AMBULATORY_CARE_PROVIDER_SITE_OTHER): Payer: Self-pay | Admitting: *Deleted

## 2009-01-25 ENCOUNTER — Ambulatory Visit: Payer: Self-pay | Admitting: Family Medicine

## 2009-01-25 ENCOUNTER — Encounter: Admission: RE | Admit: 2009-01-25 | Discharge: 2009-01-25 | Payer: Self-pay | Admitting: Family Medicine

## 2009-01-25 ENCOUNTER — Encounter: Payer: Self-pay | Admitting: Family Medicine

## 2009-01-25 LAB — CONVERTED CEMR LAB
ALT: 10 units/L (ref 0–35)
Albumin: 4.1 g/dL (ref 3.5–5.2)
BUN: 26 mg/dL — ABNORMAL HIGH (ref 6–23)
CO2: 27 meq/L (ref 19–32)
Chloride: 104 meq/L (ref 96–112)
Creatinine, Ser: 0.65 mg/dL (ref 0.40–1.20)
Glucose, Bld: 117 mg/dL — ABNORMAL HIGH (ref 70–99)
Hgb A1c MFr Bld: 7.3 %
Total Bilirubin: 0.3 mg/dL (ref 0.3–1.2)
Total CHOL/HDL Ratio: 4.1
Total Protein: 7.4 g/dL (ref 6.0–8.3)
Triglycerides: 79 mg/dL (ref <150)

## 2009-02-07 ENCOUNTER — Telehealth: Payer: Self-pay | Admitting: Family Medicine

## 2009-02-24 ENCOUNTER — Encounter: Payer: Self-pay | Admitting: Family Medicine

## 2009-04-25 ENCOUNTER — Ambulatory Visit: Payer: Self-pay | Admitting: Family Medicine

## 2009-04-25 ENCOUNTER — Emergency Department (HOSPITAL_COMMUNITY): Admission: EM | Admit: 2009-04-25 | Discharge: 2009-04-26 | Payer: Self-pay | Admitting: Emergency Medicine

## 2009-04-25 ENCOUNTER — Encounter: Payer: Self-pay | Admitting: Family Medicine

## 2009-04-25 ENCOUNTER — Telehealth (INDEPENDENT_AMBULATORY_CARE_PROVIDER_SITE_OTHER): Payer: Self-pay | Admitting: Family Medicine

## 2009-04-25 LAB — CONVERTED CEMR LAB
CO2: 27 meq/L (ref 19–32)
Chloride: 102 meq/L (ref 96–112)
Direct LDL: 166 mg/dL — ABNORMAL HIGH
Glucose, Bld: 110 mg/dL — ABNORMAL HIGH (ref 70–99)
Hgb A1c MFr Bld: 7.1 %
Potassium: 4 meq/L (ref 3.5–5.3)
Sodium: 141 meq/L (ref 135–145)
Total Bilirubin: 0.4 mg/dL (ref 0.3–1.2)

## 2009-04-27 ENCOUNTER — Emergency Department (HOSPITAL_COMMUNITY): Admission: EM | Admit: 2009-04-27 | Discharge: 2009-04-27 | Payer: Self-pay | Admitting: Emergency Medicine

## 2009-04-27 ENCOUNTER — Encounter (INDEPENDENT_AMBULATORY_CARE_PROVIDER_SITE_OTHER): Payer: Self-pay | Admitting: *Deleted

## 2009-04-27 ENCOUNTER — Encounter: Payer: Self-pay | Admitting: Family Medicine

## 2009-04-28 ENCOUNTER — Telehealth: Payer: Self-pay | Admitting: Family Medicine

## 2009-05-16 ENCOUNTER — Encounter: Admission: RE | Admit: 2009-05-16 | Discharge: 2009-05-16 | Payer: Self-pay | Admitting: Family Medicine

## 2009-06-02 ENCOUNTER — Telehealth: Payer: Self-pay | Admitting: *Deleted

## 2009-07-07 ENCOUNTER — Ambulatory Visit: Payer: Self-pay | Admitting: Family Medicine

## 2009-07-13 ENCOUNTER — Emergency Department (HOSPITAL_COMMUNITY): Admission: EM | Admit: 2009-07-13 | Discharge: 2009-07-13 | Payer: Self-pay | Admitting: Emergency Medicine

## 2009-09-06 ENCOUNTER — Ambulatory Visit: Payer: Self-pay | Admitting: Family Medicine

## 2009-09-07 ENCOUNTER — Telehealth: Payer: Self-pay | Admitting: Family Medicine

## 2009-10-06 ENCOUNTER — Emergency Department (HOSPITAL_COMMUNITY): Admission: EM | Admit: 2009-10-06 | Discharge: 2009-10-06 | Payer: Self-pay | Admitting: Emergency Medicine

## 2009-10-06 ENCOUNTER — Encounter (INDEPENDENT_AMBULATORY_CARE_PROVIDER_SITE_OTHER): Payer: Self-pay | Admitting: *Deleted

## 2009-10-11 ENCOUNTER — Telehealth: Payer: Self-pay | Admitting: Family Medicine

## 2009-12-28 ENCOUNTER — Emergency Department (HOSPITAL_COMMUNITY): Admission: EM | Admit: 2009-12-28 | Discharge: 2009-12-29 | Payer: Self-pay | Admitting: Emergency Medicine

## 2010-02-12 ENCOUNTER — Encounter: Payer: Self-pay | Admitting: Family Medicine

## 2010-02-20 ENCOUNTER — Encounter: Payer: Self-pay | Admitting: Family Medicine

## 2010-02-20 ENCOUNTER — Ambulatory Visit: Payer: Self-pay | Admitting: Family Medicine

## 2010-02-21 LAB — CONVERTED CEMR LAB
CO2: 27 meq/L (ref 19–32)
Cholesterol: 226 mg/dL — ABNORMAL HIGH (ref 0–200)
Creatinine, Ser: 0.71 mg/dL (ref 0.40–1.20)
Glucose, Bld: 119 mg/dL — ABNORMAL HIGH (ref 70–99)
Sodium: 140 meq/L (ref 135–145)
Total Bilirubin: 0.5 mg/dL (ref 0.3–1.2)
Total Protein: 6.8 g/dL (ref 6.0–8.3)
Triglycerides: 81 mg/dL (ref <150)
VLDL: 16 mg/dL (ref 0–40)

## 2010-02-22 ENCOUNTER — Telehealth: Payer: Self-pay | Admitting: *Deleted

## 2010-03-05 ENCOUNTER — Emergency Department (HOSPITAL_COMMUNITY): Admission: EM | Admit: 2010-03-05 | Discharge: 2010-03-05 | Payer: Self-pay | Admitting: Emergency Medicine

## 2010-03-14 ENCOUNTER — Encounter: Payer: Self-pay | Admitting: Family Medicine

## 2010-03-22 ENCOUNTER — Ambulatory Visit: Payer: Self-pay | Admitting: Family Medicine

## 2010-04-03 ENCOUNTER — Encounter: Payer: Self-pay | Admitting: Family Medicine

## 2010-04-12 ENCOUNTER — Emergency Department (HOSPITAL_COMMUNITY): Admission: EM | Admit: 2010-04-12 | Discharge: 2010-04-12 | Payer: Self-pay | Admitting: Emergency Medicine

## 2010-04-12 ENCOUNTER — Encounter (INDEPENDENT_AMBULATORY_CARE_PROVIDER_SITE_OTHER): Payer: Self-pay | Admitting: *Deleted

## 2010-04-12 ENCOUNTER — Encounter: Payer: Self-pay | Admitting: Family Medicine

## 2010-04-12 ENCOUNTER — Telehealth: Payer: Self-pay | Admitting: Internal Medicine

## 2010-04-13 ENCOUNTER — Encounter (INDEPENDENT_AMBULATORY_CARE_PROVIDER_SITE_OTHER): Payer: Self-pay | Admitting: *Deleted

## 2010-04-18 DIAGNOSIS — M51379 Other intervertebral disc degeneration, lumbosacral region without mention of lumbar back pain or lower extremity pain: Secondary | ICD-10-CM | POA: Insufficient documentation

## 2010-04-18 DIAGNOSIS — K828 Other specified diseases of gallbladder: Secondary | ICD-10-CM | POA: Insufficient documentation

## 2010-04-18 DIAGNOSIS — K5289 Other specified noninfective gastroenteritis and colitis: Secondary | ICD-10-CM

## 2010-04-18 DIAGNOSIS — K649 Unspecified hemorrhoids: Secondary | ICD-10-CM | POA: Insufficient documentation

## 2010-04-18 DIAGNOSIS — K439 Ventral hernia without obstruction or gangrene: Secondary | ICD-10-CM | POA: Insufficient documentation

## 2010-04-18 DIAGNOSIS — K5730 Diverticulosis of large intestine without perforation or abscess without bleeding: Secondary | ICD-10-CM | POA: Insufficient documentation

## 2010-04-18 DIAGNOSIS — M5137 Other intervertebral disc degeneration, lumbosacral region: Secondary | ICD-10-CM

## 2010-04-19 ENCOUNTER — Ambulatory Visit: Payer: Self-pay | Admitting: Internal Medicine

## 2010-04-19 DIAGNOSIS — R935 Abnormal findings on diagnostic imaging of other abdominal regions, including retroperitoneum: Secondary | ICD-10-CM | POA: Insufficient documentation

## 2010-04-23 ENCOUNTER — Ambulatory Visit (HOSPITAL_COMMUNITY): Admission: RE | Admit: 2010-04-23 | Discharge: 2010-04-23 | Payer: Self-pay | Admitting: Internal Medicine

## 2010-05-02 ENCOUNTER — Encounter: Payer: Self-pay | Admitting: Family Medicine

## 2010-05-02 ENCOUNTER — Ambulatory Visit: Payer: Self-pay | Admitting: Family Medicine

## 2010-05-10 ENCOUNTER — Encounter (INDEPENDENT_AMBULATORY_CARE_PROVIDER_SITE_OTHER): Payer: Self-pay | Admitting: *Deleted

## 2010-05-10 ENCOUNTER — Telehealth: Payer: Self-pay | Admitting: Family Medicine

## 2010-05-14 ENCOUNTER — Ambulatory Visit: Payer: Self-pay | Admitting: Internal Medicine

## 2010-05-15 ENCOUNTER — Telehealth (INDEPENDENT_AMBULATORY_CARE_PROVIDER_SITE_OTHER): Payer: Self-pay | Admitting: *Deleted

## 2010-05-17 ENCOUNTER — Encounter: Admission: RE | Admit: 2010-05-17 | Discharge: 2010-05-17 | Payer: Self-pay | Admitting: Family Medicine

## 2010-05-17 ENCOUNTER — Telehealth (INDEPENDENT_AMBULATORY_CARE_PROVIDER_SITE_OTHER): Payer: Self-pay | Admitting: Family Medicine

## 2010-05-18 ENCOUNTER — Telehealth: Payer: Self-pay | Admitting: Internal Medicine

## 2010-05-21 ENCOUNTER — Telehealth (INDEPENDENT_AMBULATORY_CARE_PROVIDER_SITE_OTHER): Payer: Self-pay | Admitting: *Deleted

## 2010-05-22 ENCOUNTER — Ambulatory Visit: Payer: Self-pay | Admitting: Internal Medicine

## 2010-05-24 ENCOUNTER — Encounter: Payer: Self-pay | Admitting: Internal Medicine

## 2010-06-05 ENCOUNTER — Ambulatory Visit: Payer: Self-pay | Admitting: Family Medicine

## 2010-06-05 ENCOUNTER — Telehealth: Payer: Self-pay | Admitting: Family Medicine

## 2010-06-05 DIAGNOSIS — M25569 Pain in unspecified knee: Secondary | ICD-10-CM

## 2010-08-13 ENCOUNTER — Emergency Department (HOSPITAL_COMMUNITY): Admission: EM | Admit: 2010-08-13 | Discharge: 2010-08-14 | Payer: Self-pay | Admitting: Emergency Medicine

## 2010-08-21 ENCOUNTER — Telehealth: Payer: Self-pay | Admitting: *Deleted

## 2010-08-28 ENCOUNTER — Telehealth: Payer: Self-pay | Admitting: Family Medicine

## 2010-09-07 ENCOUNTER — Encounter: Payer: Self-pay | Admitting: Family Medicine

## 2010-09-07 DIAGNOSIS — J45909 Unspecified asthma, uncomplicated: Secondary | ICD-10-CM

## 2010-09-07 HISTORY — DX: Unspecified asthma, uncomplicated: J45.909

## 2010-09-13 ENCOUNTER — Encounter: Payer: Self-pay | Admitting: Sports Medicine

## 2010-09-13 ENCOUNTER — Ambulatory Visit: Payer: Self-pay | Admitting: Family Medicine

## 2010-09-13 ENCOUNTER — Encounter: Payer: Self-pay | Admitting: Family Medicine

## 2010-09-13 DIAGNOSIS — B351 Tinea unguium: Secondary | ICD-10-CM

## 2010-09-13 LAB — CONVERTED CEMR LAB
ALT: 12 U/L (ref 0–35)
AST: 14 U/L (ref 0–37)
Albumin: 4.2 g/dL (ref 3.5–5.2)
Alkaline Phosphatase: 50 U/L (ref 39–117)
BUN: 25 mg/dL — ABNORMAL HIGH (ref 6–23)
CO2: 29 meq/L (ref 19–32)
Calcium: 10 mg/dL (ref 8.4–10.5)
Chloride: 103 meq/L (ref 96–112)
Creatinine, Ser: 0.81 mg/dL (ref 0.40–1.20)
Glucose, Bld: 115 mg/dL — ABNORMAL HIGH (ref 70–99)
Hgb A1c MFr Bld: 6.9 %
Potassium: 3.8 meq/L (ref 3.5–5.3)
Sodium: 142 meq/L (ref 135–145)
Total Bilirubin: 0.3 mg/dL (ref 0.3–1.2)
Total Protein: 7.3 g/dL (ref 6.0–8.3)

## 2010-09-28 ENCOUNTER — Encounter (INDEPENDENT_AMBULATORY_CARE_PROVIDER_SITE_OTHER): Payer: Self-pay | Admitting: Pharmacist

## 2010-10-19 ENCOUNTER — Encounter (INDEPENDENT_AMBULATORY_CARE_PROVIDER_SITE_OTHER): Payer: Self-pay | Admitting: *Deleted

## 2010-10-24 ENCOUNTER — Ambulatory Visit: Payer: Self-pay | Admitting: Family Medicine

## 2010-11-15 ENCOUNTER — Emergency Department (HOSPITAL_COMMUNITY): Admission: EM | Admit: 2010-11-15 | Discharge: 2010-10-19 | Payer: Self-pay | Admitting: Emergency Medicine

## 2010-12-11 ENCOUNTER — Ambulatory Visit
Admission: RE | Admit: 2010-12-11 | Discharge: 2010-12-11 | Payer: Self-pay | Source: Home / Self Care | Attending: Internal Medicine | Admitting: Internal Medicine

## 2011-01-08 ENCOUNTER — Emergency Department (HOSPITAL_COMMUNITY)
Admission: EM | Admit: 2011-01-08 | Discharge: 2011-01-08 | Disposition: A | Discharge status: Home / Self Care | Payer: BC Managed Care – PPO | Attending: Emergency Medicine | Admitting: Emergency Medicine

## 2011-01-08 DIAGNOSIS — M25569 Pain in unspecified knee: Secondary | ICD-10-CM | POA: Insufficient documentation

## 2011-01-08 NOTE — Discharge Summary (Signed)
Summary: Chest Pain....=? GI bleed ou Coumadin                    [place]. [facility]  Patient:    Meredith [name], Meredith [name] Visit Number: [id number] MRN: [id number]          Service Type: MED Location: 2000 2006 01 Attending Physician:  McDiarmid, Leighton Roach. Dictated by:   Rodolph Bong, M.D. Admit Date:  06/16/2002 Discharge Date: 06/19/2002   CC:         Michell Heinrich, M.D., Family Practice   Discharge Summary  DISCHARGE DIAGNOSES: 1. Chest pain. 2. Diabetes mellitus. 3. Hyperlipidemia. 4. Hypertension. 5. Obesity. 6. Anemia. 7. Paroxysmal atrial fibrillation. 8. Gastroesophageal reflux disease. 9. Questionable gastrointestinal bleed on Coumadin.  DISCHARGE MEDICATIONS: 1. Aspirin 81 mg p.o. q.d. 2. Lotensin 40 mg p.o. q.d. 3. Diltiazem 60 mg p.o. q.d. 4. Zocor 40 mg p.o. q.d. 5. Nitroglycerin 0.4 mg sublingual tablets one tablet p.o. q.5 minutes    for up to a total of three.  PROCEDURES: 1. A two-day Cardiolite which was negative for induced ischemia, revealed    a small area of decreased inferior apical activity, question of scarring    versus thinning. She had an LVEF of 61%. 2. A chest x-ray with mild cardiomegaly.  CONSULTS: 1. Cardiology, Colleen Can. Deborah Chalk, M.D. 2. Financial assistance with Rudell Cobb.  HISTORY OF PRESENT ILLNESS:  This is a 60 year old [name] with multiple risk factors who presented with one-day of substernal chest tightness.  She denied any radiation but did acknowledge some diaphoresis with the episode yesterday. She denied any nausea or vomiting. She reports some palpitations which were unusual for her.  In addition, while at work. She had some shortness of breath with exertion that was atypical.  HOSPITAL COURSE:  #1 - CHEST TIGHTNESS:  The patient presented with symptoms listed in the history and physical section.  She received nitroglycerin in the emergency department which relieved her chest  tightness.  In addition, the patient received aspirin, Lotensin and Diltiazem while in the hospital. The beta-blocker was not started during her hospital admission secondary to history of asthma per patient and pulse rate in the 60s.  She had an EKG done upon admission which revealed a questionable Q-wave in the aVL.  The EKG was also significant for multiple PVCs and poor R-wave progression versus a prior EKG on May 2001.  There was no obvious acute ST abnormalities.  However, considering her multiple risk factors and subtle EKG changes, she was started on heparin while in the emergency department and remained on heparin for two days of hospitalization.  While in the hospital, she had negative cardiac enzymes x3 and no changes noted by telemetry.  She underwent a two-day Cardiolite by [employer]. Deborah Chalk, M.D., which revealed no evidence for induced ischemia and a small area of decreased inferior apical activity that was consistent with scarring or thinning. She had an LVEF of 61%.  I discussed these findings with Dr. Deborah Chalk and he indicated discharge was appropriate at this time with current medical regimen and follow-up as needed.  #2 - DIABETES MELLITUS:  The patients diabetes is controlled at home on glucophage.  However, due to the possibility of intervention, this was held upon admission. She was maintained in fairly good control on sliding scale insulin while in the hospital.  She should resume her glucophage upon discharge.  #3 - HYPERLIPIDEMIA:  Her lipid panel while in the hospital  revealed total cholesterol 275, triglycerides 153, HDL 62, and LDL 182.  Therefore, she was sent out on Zocor 40 mg p.o. q.d. with a one month sample from the [facility]. She acknowledged that inability to afford the medication had resulted in her not taking it for quite a while.  #4 - HYPERTENSION:  While in the hospital, the patients blood pressure was somewhat labile but fairly well  controlled.  She is currently taking Lotensin 40 mg q.d. and Cardizem CD 360 mg q.d.  She does report history of asthma and maintained a pulse rate in the 60s while in hospitalization.  If necessary, a change in these medications or addition of another medication can be pursued as an outpatient.  #5 - OBESITY:  The patient was maintained on a fat modified ADA diet while in the hospital.  #6 - ANEMIA:  Stable while in hospital.  #7 - PAROXYSMAL ATRIAL FIBRILLATION:  There were no episodes while in hospital.  #8 - GASTROESOPHAGEAL REFLUX DISEASE: Stable while in hospital.  Maintained on Protonix.  DISCHARGE DIET:  American Diabetic Association diet.  SPECIAL INSTRUCTIONS:  The patient was advised to follow up with Rudell Cobb from [employer] regarding assistance with obtaining her medication. Ms. Meredith [name] assisted by providing several resources for the patient to pursue as an outpatient.  She was also advised to take her nitroglycerin tablets if resumption of chest pain.  If no relief after three tablets, call your doctor or return to the emergency department.  FOLLOW-UP: 1. Michell Heinrich, M.D., [facility], on Monday,    June 05, 2002, at 1:30 p.m. 2. Cardiology, Colleen Can. Deborah Chalk, M.D., as needed. Dictated by:   Rodolph Bong, M.D. Attending Physician:  McDiarmid, Tawanna Cooler D. DD:  06/19/02 TD:  06/22/02 Job: [id number]

## 2011-01-08 NOTE — Assessment & Plan Note (Signed)
Summary: Constipation & chronic issues   Vital Signs:  Patient profile:   60 year old female Height:      63.25 inches Weight:      244.1 pounds BMI:     43.05 Temp:     97.9 degrees F oral Pulse rate:   62 / minute BP sitting:   108 / 73  (left arm)  Vitals Entered By: Theresia Lo RN (February 20, 2010 9:22 AM) CC: low abdominal pain, pressure when has BM or voids Is Patient Diabetic? Yes Pain Assessment Patient in pain? yes     Location: low abd  Intensity: 6   Primary Care Provider:  Marisue Ivan  MD  CC:  low abdominal pain and pressure when has BM or voids.  History of Present Illness: 60yo F c/o diffuse abd pain  Diffuse abd pain: States that this is a chronic issue w/ hx of constipation.  She is not currently on any regimen.  Reports occasional hard stools.  No blood in the stool.  No N/V or fevers.    HLD: Has not started the pravastatin.    DM:  Currently on Metformin.  No adverse effects.  No hypoglycemic events. Mild paresthesia or peripheral nerve pain.  CBGs checked 1 times a day and typically run b/w 90-130s.   HTN: No adverse effects from medication.  Not checking it regularly.  Was well controlled at last visit.  No dizziness, HA, CP, palpitations, or swelling.     Habits & Providers  Alcohol-Tobacco-Diet     Tobacco Status: quit  Current Medications (verified): 1)  Metformin Hcl 1000 Mg Tabs (Metformin Hcl) .... Take 1 Tablet By Mouth Two Times A Day 2)  Lisinopril-Hydrochlorothiazide 20-25 Mg  Tabs (Lisinopril-Hydrochlorothiazide) .Marland Kitchen.. 1 Tab By Mouth Daily. 3)  Bayer Childrens Aspirin 81 Mg Chew (Aspirin) .... Take 1 Tablet By Mouth Once A Day 4)  Miralax   Powd (Polyethylene Glycol 3350) .Marland Kitchen.. 1 Packet in A Glass of Water Daily For Constipation 5)  Pravastatin Sodium 40 Mg Tabs (Pravastatin Sodium) .... One Tablet By Mouth Daily 6)  Tylenol Extra Strength 500 Mg Tabs (Acetaminophen) .... 2 Tablets By Mouth Three Times A Day For Arthritis 7)   Calcium-Vitamin D 500-200 Mg-Unit Tabs (Calcium Carbonate-Vitamin D) .... One Tablet By Mouth Two Times A Day  Allergies (verified): 1)  ! * Skelaxin  Social History: Smoking Status:  quit  Review of Systems       No dizziness, HA, CP, palpitations, or swelling. No blood in the stool.  No N/V or fevers.    Physical Exam  General:  VS Reviewed. Obese, well appearing, NAD.  Neck:  supple, full ROM, no goiter or mass  Lungs:  Normal respiratory effort, chest expands symmetrically. Lungs are clear to auscultation, no crackles or wheezes. Heart:  Normal rate and regular rhythm. S1 and S2 normal without gallop, murmur, click, rub or other extra sounds. Abdomen:  obese, soft, NT, ND, no HSM, active BS  Extremities:  no edema Neurologic:  no focal deficits   Impression & Recommendations:  Problem # 1:  CONSTIPATION (ICD-564.00) Assessment Deteriorated I think the abd discomfort is due to her constipation. No signs of acute abdomen. Will place her on a constipation regimen and f/u in 1 month.   Her updated medication list for this problem includes:    Miralax Powd (Polyethylene glycol 3350) .Marland Kitchen... 1 packet in a glass of water daily for constipation  Orders: [facility]- Est  Level 4 (56213)  Problem # 2:  HYPERTENSION, BENIGN SYSTEMIC (ICD-401.1) Assessment: Unchanged At goal (<140/90). No changes to regimen.  Her updated medication list for this problem includes:    Lisinopril-hydrochlorothiazide 20-25 Mg Tabs (Lisinopril-hydrochlorothiazide) .Marland Kitchen... 1 tab by mouth daily.  Orders: [facility]- Est  Level 4 (16109)  Problem # 3:  DIABETES MELLITUS II, UNCOMPLICATED (ICD-250.00) Assessment: Unchanged Close to goal..Marland KitchenA1c 7.2%. No changes to regimen at this time.  I don't think the metformin is contributing to her symptoms.   Discussed healthy diet and wt loss to help with DM.  Her updated medication list for this problem includes:    Metformin Hcl 1000 Mg Tabs (Metformin hcl) .Marland Kitchen...  Take 1 tablet by mouth two times a day    Lisinopril-hydrochlorothiazide 20-25 Mg Tabs (Lisinopril-hydrochlorothiazide) .Marland Kitchen... 1 tab by mouth daily.    Bayer Childrens Aspirin 81 Mg Chew (Aspirin) .Marland Kitchen... Take 1 tablet by mouth once a day  Orders: A1C-[facility] (60454) [facility]- Est  Level 4 (09811)  Problem # 4:  HYPERCHOLESTEROLEMIA (ICD-272.0) Assessment: Unchanged Pt has not started the pravastatin. She is to pick it up and start it today.  Her updated medication list for this problem includes:    Pravastatin Sodium 40 Mg Tabs (Pravastatin sodium) ..... One tablet by mouth daily  Orders: T-Lipid Profile (91478-29562) [facility]- Est  Level 4 (13086)  Problem # 5:  OBESITY, NOS (ICD-278.00) Assessment: Deteriorated Gained 14lbs since last visit. Discussed importance of wt loss and healthy food choices and daily activity.  Complete Medication List: 1)  Metformin Hcl 1000 Mg Tabs (Metformin hcl) .... Take 1 tablet by mouth two times a day 2)  Lisinopril-hydrochlorothiazide 20-25 Mg Tabs (Lisinopril-hydrochlorothiazide) .Marland Kitchen.. 1 tab by mouth daily. 3)  Bayer Childrens Aspirin 81 Mg Chew (Aspirin) .... Take 1 tablet by mouth once a day 4)  Miralax Powd (Polyethylene glycol 3350) .Marland Kitchen.. 1 packet in a glass of water daily for constipation 5)  Pravastatin Sodium 40 Mg Tabs (Pravastatin sodium) .... One tablet by mouth daily 6)  Tylenol Extra Strength 500 Mg Tabs (Acetaminophen) .... 2 tablets by mouth three times a day for arthritis 7)  Calcium-vitamin D 500-200 Mg-unit Tabs (Calcium carbonate-vitamin d) .... One tablet by mouth two times a day  Other Orders: T-Comprehensive Metabolic Panel [phone])  Patient Instructions: 1)  For constipation- Continue to drink plenty of water, increase the amount of fiber (or use Benafiber), use miralax 17g in 8oz of water daily, and Colace (stool softner) 100mg  two times a day. 2)  Please schedule a follow-up appointment in 1 month to reassess your constipation and  your tolerance of pravastatin. Prescriptions: PRAVASTATIN SODIUM 40 MG TABS (PRAVASTATIN SODIUM) one tablet by mouth daily  #30 x 2   Entered and Authorized by:   Marisue Ivan  MD   Signed by:   Marisue Ivan  MD on 02/20/2010   Method used:   Electronically to        [facility]  [address] [phone]* (retail)       [address]       [place], [place]  [address]       Ph: [phone]       Fax: [phone]   RxID:   [id number]    Prevention & Chronic Care Immunizations   Influenza vaccine: declined  (01/01/2008)   Influenza vaccine deferral: Refused  (02/20/2010)   Influenza vaccine due: Refused  (01/25/2009)    Tetanus booster: 12/11/2007: Tdap   Tetanus  booster due: 12/10/2017    Pneumococcal vaccine: declined  (01/01/2008)   Pneumococcal vaccine due: None  Colorectal Screening   Hemoccult: Done.  (08/13/2004)   Hemoccult due: Not Indicated    Colonoscopy: Diverticulosis  (04/29/2000)   Colonoscopy due: 05/2010  Other Screening   Pap smear: discussed - s/p hysterectomy  (01/01/2008)   Pap smear due: Not Indicated    Mammogram: Normal  (05/16/2009)   Mammogram due: 05/2010   Smoking status: quit  (02/20/2010)  Diabetes Mellitus   HgbA1C: 7.2  (02/20/2010)   HgbA1C action/deferral: Ordered  (02/20/2010)   Hemoglobin A1C due: 03/31/2008    Eye exam: Not documented   Diabetic eye exam action/deferral: Deferred  (09/06/2009)    Foot exam: yes  (12/11/2007)   High risk foot: Not documented   Foot care education: Not documented   Foot exam due: 12/10/2008    Urine microalbumin/creatinine ratio: Not documented    Diabetes flowsheet reviewed?: Yes   Progress toward A1C goal: At goal  Lipids   Total Cholesterol: 261  (01/25/2009)   Lipid panel action/deferral: Lipid Panel ordered   LDL: 181  (01/25/2009)   LDL Direct: 166  (04/25/2009)   HDL: 64  (01/25/2009)   Triglycerides: 79  (01/25/2009)    SGOT (AST): 11   (04/25/2009)   BMP action: Ordered   SGPT (ALT): 10  (04/25/2009) CMP ordered    Alkaline phosphatase: 53  (04/25/2009)   Total bilirubin: 0.4  (04/25/2009)    Lipid flowsheet reviewed?: Yes   Progress toward LDL goal: Unchanged  Hypertension   Last Blood Pressure: 108 / 73  (02/20/2010)   Serum creatinine: 0.73  (04/25/2009)   BMP action: Ordered   Serum potassium 4.0  (04/25/2009) CMP ordered     Hypertension flowsheet reviewed?: Yes   Progress toward BP goal: At goal  Self-Management Support :   Personal Goals (by the next clinic visit) :     Personal A1C goal: 7  (09/06/2009)     Personal blood pressure goal: 130/80  (09/06/2009)     Personal LDL goal: 100  (09/06/2009)    Patient will work on the following items until the next clinic visit to reach self-care goals:     Medications and monitoring: take my medicines every day, check my blood sugar, check my blood pressure  (02/20/2010)     Eating: drink diet soda or water instead of juice or soda, eat more vegetables, use fresh or frozen vegetables, eat foods that are low in salt, eat baked foods instead of fried foods, eat fruit for snacks and desserts, limit or avoid alcohol  (02/20/2010)     Activity: take a 30 minute walk every day  (09/06/2009)    Diabetes self-management support: CBG self-monitoring log, Written self-care plan, Education handout  (02/20/2010)   Diabetes care plan printed   Diabetes education handout printed    Hypertension self-management support: CBG self-monitoring log, Written self-care plan  (02/20/2010)   Hypertension self-care plan printed.    Lipid self-management support: Written self-care plan  (02/20/2010)   Lipid self-care plan printed.   Nursing Instructions: HgbA1C today (see order)   Laboratory Results   Blood Tests   Date/Time Received: February 20, 2010 9:56 AM  Date/Time Reported: February 20, 2010 10:20 AM   HGBA1C: 7.2%   (Normal Range: Non-Diabetic - 3-6%   Control  Diabetic - 6-8%)  Comments: ...............test performed by......Marland KitchenBonnie A. [place], MLS (ASCP)cm

## 2011-01-08 NOTE — Assessment & Plan Note (Signed)
Summary: knee pain/[place]/ritch   Vital Signs:  Patient profile:   60 year old female Height:      63.5 inches Weight:      243 pounds BMI:     42.52 Temp:     98.7 degrees F oral Pulse rate:   70 / minute BP sitting:   104 / 71  (right arm) Cuff size:   large  Vitals Entered By: Tessie Fass CMA (June 05, 2010 11:01 AM) CC: right knee pain Is Patient Diabetic? Yes Pain Assessment Patient in pain? yes     Location: right knee Intensity: 7   Primary Care Provider:  Marisue Ivan  MD  CC:  right knee pain.  History of Present Illness: Ms. Kudo comes in today for right knee pain.  Has chronic knee pain that worsened yesterday at work when it "gave out" on her and she feel.  Has chronic arthritis.  Has seen an orthopedist. He told her she would have to have a knee replacement someday but she says she won't even consider it.  They offered her injections but she also turned that down.  She has been using alever and it is helping some.  Hot water "loosened' it up.  Has percocet she takes "hardly ever" for stomach pain.  Rarely takes it because it causes constipation.  Hasn't tried it since her knee began hurting.  Last xrays done in 2007 showed extensive joint disease and possible loose body (reviewed in E-Chart)  Habits & Providers  Alcohol-Tobacco-Diet     Tobacco Status: quit  Allergies: 1)  ! * Skelaxin  Physical Exam  General:  morbidly obese, alert, NAD vitals reviewed Msk:  R Knee: full extension  flexion limited by pain to approx 150 degrees No pain with palpation of joint line McMurray's negative Negative ant drawer/lachman's Stable to varus and valgus stress.      Impression & Recommendations:  Problem # 1:  KNEE PAIN, RIGHT (ICD-719.46) Offered injection again, patient declined.  Offered ortho referral, patient declined.  Told her she could use the oxycodone for a few days to calm it down, she declined (doens't want the constipation).  Will try mobic  and ice.  Will get Xray again to see if it has progressed and help with longterm management/plan.  If no improvement could try PT if patient will agree.  Her updated medication list for this problem includes:    Bayer Childrens Aspirin 81 Mg Chew (Aspirin) .Marland Kitchen... Take 1 tablet by mouth once a day    Oxycodone-acetaminophen 5-325 Mg Tabs (Oxycodone-acetaminophen) .Marland Kitchen... Take one-two by mouth as needed abdominal pain    Meloxicam 15 Mg Tabs (Meloxicam) .Marland Kitchen... 1 tab by mouth daily with food for arthritis  Orders: Radiology other (Radiology Other) [facility]- Est Level  3 (78295)  Complete Medication List: 1)  Metformin Hcl 1000 Mg Tabs (Metformin hcl) .... Take 1 tablet by mouth two times a day 2)  Lisinopril-hydrochlorothiazide 20-25 Mg Tabs (Lisinopril-hydrochlorothiazide) .Marland Kitchen.. 1 tab by mouth daily. 3)  Bayer Childrens Aspirin 81 Mg Chew (Aspirin) .... Take 1 tablet by mouth once a day 4)  Miralax Powd (Polyethylene glycol 3350) .Marland Kitchen.. 1 packet in a glass of water daily for constipation as needed 5)  Pravastatin Sodium 80 Mg Tabs (Pravastatin sodium) .Marland Kitchen.. 1 tab by mouth daily 6)  Calcium-vitamin D 500-200 Mg-unit Tabs (Calcium carbonate-vitamin d) .... One tablet by mouth two times a day 7)  Oxycodone-acetaminophen 5-325 Mg Tabs (Oxycodone-acetaminophen) .... Take one-two by mouth as needed  abdominal pain 8)  Meloxicam 15 Mg Tabs (Meloxicam) .Marland Kitchen.. 1 tab by mouth daily with food for arthritis  Patient Instructions: 1)  Use the meloxicam (mobic) once a day everyday to help your knee pain. 2)  IF you decide you would like a shot in your knee, we do them here. 3)  Please get the xray of your knee at your earliest convenience.  Prescriptions: MELOXICAM 15 MG TABS (MELOXICAM) 1 tab by mouth daily with food for arthritis  #30 x 3   Entered and Authorized by:   Ardeen Garland  MD   Signed by:   Ardeen Garland  MD on 06/05/2010   Method used:   Print then Give to Patient   RxID:   [id number]

## 2011-01-08 NOTE — Assessment & Plan Note (Signed)
Summary: f/u constipation- no change (did not try miralax)   Vital Signs:  Patient profile:   60 year old female Height:      63.25 inches Weight:      247.8 pounds BMI:     43.71 Temp:     97.9 degrees F oral Pulse rate:   65 / minute BP sitting:   142 / 87  (left arm) Cuff size:   large  Vitals Entered By: Gladstone Pih (March 22, 2010 8:58 AM) CC: F/U Is Patient Diabetic? Yes Did you bring your meter with you today? No Pain Assessment Patient in pain? no        Primary Care Provider:  Marisue Ivan  MD  CC:  F/U.  History of Present Illness: 60yo F here for f/u of constipation  Constipation: States that the course is the same but that she has intermittent abd pain.  Also states that she never used the Miralax as prescribed but remembers the Miralax being very effective.  Instead, she has been using benafiber.  States that she has lots of flatulence.  No diarrhea, N/V, or bloody stools or fevers.  Last BM was this morning and soft.  BMs are irregular intervals.  Habits & Providers  Alcohol-Tobacco-Diet     Tobacco Status: never  Current Medications (verified): 1)  Metformin Hcl 1000 Mg Tabs (Metformin Hcl) .... Take 1 Tablet By Mouth Two Times A Day 2)  Lisinopril-Hydrochlorothiazide 20-25 Mg  Tabs (Lisinopril-Hydrochlorothiazide) .Marland Kitchen.. 1 Tab By Mouth Daily. 3)  Bayer Childrens Aspirin 81 Mg Chew (Aspirin) .... Take 1 Tablet By Mouth Once A Day 4)  Miralax   Powd (Polyethylene Glycol 3350) .Marland Kitchen.. 1 Packet in A Glass of Water Daily For Constipation 5)  Pravastatin Sodium 80 Mg Tabs (Pravastatin Sodium) .Marland Kitchen.. 1 Tab By Mouth Daily 6)  Tylenol Extra Strength 500 Mg Tabs (Acetaminophen) .... 2 Tablets By Mouth Three Times A Day For Arthritis 7)  Calcium-Vitamin D 500-200 Mg-Unit Tabs (Calcium Carbonate-Vitamin D) .... One Tablet By Mouth Two Times A Day  Allergies (verified): 1)  ! * Skelaxin  Social History: Smoking Status:  never  Review of Systems      See  HPI  Physical Exam  General:  VS Reviewed. Obese, well appearing, NAD.  Abdomen:  Soft, obese, NT, ND, no HSM, active BS    Impression & Recommendations:  Problem # 1:  CONSTIPATION (ICD-564.00) Assessment Unchanged  Course unchanged b/c she did not follow the instructions to use the miralax. She is going to try the miralax daily and f/u if no improvement.  Her updated medication list for this problem includes:    Miralax Powd (Polyethylene glycol 3350) .Marland Kitchen... 1 packet in a glass of water daily for constipation  Orders: [facility]- Est Level  3 (62952)  Complete Medication List: 1)  Metformin Hcl 1000 Mg Tabs (Metformin hcl) .... Take 1 tablet by mouth two times a day 2)  Lisinopril-hydrochlorothiazide 20-25 Mg Tabs (Lisinopril-hydrochlorothiazide) .Marland Kitchen.. 1 tab by mouth daily. 3)  Bayer Childrens Aspirin 81 Mg Chew (Aspirin) .... Take 1 tablet by mouth once a day 4)  Miralax Powd (Polyethylene glycol 3350) .Marland Kitchen.. 1 packet in a glass of water daily for constipation 5)  Pravastatin Sodium 80 Mg Tabs (Pravastatin sodium) .Marland Kitchen.. 1 tab by mouth daily 6)  Tylenol Extra Strength 500 Mg Tabs (Acetaminophen) .... 2 tablets by mouth three times a day for arthritis 7)  Calcium-vitamin D 500-200 Mg-unit Tabs (Calcium carbonate-vitamin d) .... One tablet by  mouth two times a day  Patient Instructions: 1)  Please schedule a follow-up appointment in 1 month to reassess the constipation.  If you are all better, cancel the appt. 2)  Use the miralax everyday.

## 2011-01-08 NOTE — Assessment & Plan Note (Signed)
Summary: ed f/u,df   Vital Signs:  Patient profile:   60 year old female Weight:      249 pounds Temp:     97.9 degrees F oral Pulse rate:   63 / minute Pulse rhythm:   regular BP sitting:   143 / 89  (left arm) Cuff size:   large  Vitals Entered By: Loralee Pacas CMA (October 24, 2010 8:54 AM) CC: follow-up visit Is Patient Diabetic? Yes   Primary Care Provider:  Majel Homer MD  CC:  follow-up visit.  History of Present Illness: 60 yo female with abd pain.  Seen in ED recently, CBC with mild leukocytosis, CT abd with enteritis suggestive of IBD.  She has no hx IBD however she gets disabling abd pain, diffuse approx 3x a year that req hospitalization.  Symptoms are overall better and this is her ER f/u appt.  She was given percocet for pain which helps a little and augmentin.  She has been seen by Dr. Juanda Chance at [facility] in the recent past.  She has never had serologies for IBD.  She is also not taking any anti-inflammatory medications for pain.  No diarrhea, no N/V/C, no fevers/chills.  Tolerating by mouth diet well.  Knee pain:  Injected R knee in the past and this helped a lot, she is amenable to discuss this at a later visit.  Onychomycosis:  She has not yet started her Lamisil, she is amenable to discuss this at another visit as well.  Habits & Providers  Alcohol-Tobacco-Diet     Tobacco Status: quit     Tobacco Counseling: to remain off tobacco products  Exercise-Depression-Behavior     Have you felt down or hopeless? no     Have you felt little pleasure in things? no     Depression Counseling: not indicated; screening negative for depression     Seat Belt Use: always  Current Medications (verified): 1)  Metformin Hcl 1000 Mg Tabs (Metformin Hcl) .... Take 1 Tablet By Mouth Two Times A Day 2)  Lisinopril-Hydrochlorothiazide 20-25 Mg  Tabs (Lisinopril-Hydrochlorothiazide) .Marland Kitchen.. 1 Tab By Mouth Daily. 3)  Bayer Childrens Aspirin 81 Mg Chew (Aspirin) .... Take 1 Tablet  By Mouth Once A Day 4)  Miralax   Powd (Polyethylene Glycol 3350) .Marland Kitchen.. 1 Packet in A Glass of Water Daily For Constipation As Needed 5)  Pravastatin Sodium 80 Mg Tabs (Pravastatin Sodium) .Marland Kitchen.. 1 Tab By Mouth Daily 6)  Calcium-Vitamin D 500-200 Mg-Unit Tabs (Calcium Carbonate-Vitamin D) .... One Tablet By Mouth Two Times A Day 7)  Oxycodone-Acetaminophen 5-325 Mg Tabs (Oxycodone-Acetaminophen) .... Take One-Two By Mouth As Needed Abdominal Pain 8)  Meloxicam 15 Mg Tabs (Meloxicam) .Marland Kitchen.. 1 Tab By Mouth Daily With Food For Arthritis 9)  Lamisil 250 Mg Tabs (Terbinafine Hcl) .... One Tab By Mouth Daily X 12 Weeks  Allergies (verified): 1)  ! * Skelaxin  Social History: Risk analyst Use:  always  Review of Systems       See HPI  Physical Exam  General:  Well-developed,well-nourished,in no acute distress; alert,appropriate and cooperative throughout examination Lungs:  Normal respiratory effort, chest expands symmetrically. Lungs are clear to auscultation, no crackles or wheezes. Heart:  Normal rate and regular rhythm. S1 and S2 normal without gallop, murmur, click, rub or other extra sounds. Abdomen:  Bowel sounds positive,abdomen soft and non-tender without masses, organomegaly or hernias noted.   Impression & Recommendations:  Problem # 1:  ENTERITIS (ICD-558.9) Assessment Deteriorated With three episodes  thusfar. Will refer back to [facility] for help in diagnosis and managing this. IBD serologies may be indicated. Mobic for anti-inflammatory properties.  Orders: [facility]- Est  Level 4 (43329) Gastroenterology Referral (GI)  Problem # 2:  OSTEOARTHRITIS, MULTI SITES (ICD-715.98) Assessment: Unchanged Will reasses at next visit.  She would likely benefit from another knee cortisone injection as it worked on the right side very well.  Her updated medication list for this problem includes:    Bayer Childrens Aspirin 81 Mg Chew (Aspirin) .Marland Kitchen... Take 1 tablet by mouth once a day     Oxycodone-acetaminophen 5-325 Mg Tabs (Oxycodone-acetaminophen) .Marland Kitchen... Take one-two by mouth as needed abdominal pain    Meloxicam 15 Mg Tabs (Meloxicam) .Marland Kitchen... 1 tab by mouth daily with food for arthritis  Problem # 3:  ONYCHOMYCOSIS, TOENAILS (ICD-110.1) Assessment: Unchanged Pt would like to defer treatment until after her abdominal issues are addressed fully. I agree and we will defer this until the next visit.  Her updated medication list for this problem includes:    Lamisil 250 Mg Tabs (Terbinafine hcl) ..... One tab by mouth daily x 12 weeks  Orders: [facility]- Est  Level 4 (51884)  Complete Medication List: 1)  Metformin Hcl 1000 Mg Tabs (Metformin hcl) .... Take 1 tablet by mouth two times a day 2)  Lisinopril-hydrochlorothiazide 20-25 Mg Tabs (Lisinopril-hydrochlorothiazide) .Marland Kitchen.. 1 tab by mouth daily. 3)  Bayer Childrens Aspirin 81 Mg Chew (Aspirin) .... Take 1 tablet by mouth once a day 4)  Miralax Powd (Polyethylene glycol 3350) .Marland Kitchen.. 1 packet in a glass of water daily for constipation as needed 5)  Pravastatin Sodium 80 Mg Tabs (Pravastatin sodium) .Marland Kitchen.. 1 tab by mouth daily 6)  Calcium-vitamin D 500-200 Mg-unit Tabs (Calcium carbonate-vitamin d) .... One tablet by mouth two times a day 7)  Oxycodone-acetaminophen 5-325 Mg Tabs (Oxycodone-acetaminophen) .... Take one-two by mouth as needed abdominal pain 8)  Meloxicam 15 Mg Tabs (Meloxicam) .Marland Kitchen.. 1 tab by mouth daily with food for arthritis 9)  Lamisil 250 Mg Tabs (Terbinafine hcl) .... One tab by mouth daily x 12 weeks  Patient Instructions: 1)  Refilled your anti-inflammatory, try this for your abd pain. 2)  See Dr. Juanda Chance at [facility]. 3)  Come back to see me to discuss knee pain and toenails. 4)  -Dr. Karie Schwalbe. Prescriptions: MELOXICAM 15 MG TABS (MELOXICAM) 1 tab by mouth daily with food for arthritis  #30 x 3   Entered and Authorized by:   Rodney Langton MD   Signed by:   Rodney Langton MD on 10/24/2010   Method used:    Electronically to        [facility]  [address] [phone]* (retail)       [address]       [place], [place]  [address]       Ph: [phone]       Fax: [phone]   RxID:   [id number]    Orders Added: 1)  [facility]- Est  Level 4 [73710] 2)  Gastroenterology Referral [GI]

## 2011-01-08 NOTE — Op Note (Signed)
Summary: ENDO/COLON                    Eligha Bridegroom. [facility]  Patient:    Meredith [name], Meredith [name]                      MRN: [id number] Proc. Date: 04/29/00 Adm. Date:  [id number] Disc. Date: [id number] Attending:  McDiarmid, Leighton Roach CC:         Huey Bienenstock McDiarmid, M.D.                           Procedure Report  PROCEDURE:  Upper endoscopy and colonoscopy.  ENDOSCOPIST:  Hedwig Morton. Juanda Chance, M.D.  INDICATIONS:  This 60 year old black female diabetic was admitted with severe iron deficiency anemia, despite taking iron supplements on a daily basis for the past four weeks.  She has had dark stools as well as bright red blood per rectum. She was started on Coumadin several weeks ago, due to paroxysmal atrial fibrillation. On discharge from the hospital several weeks ago, her hemoglobin was 9.3, and it has come up only to 10.0 g.  She has been Hemoccult-positive.  She is undergoing a colonoscopy as well as an upper endoscopy for further evaluation of her GI blood loss.  She also has been having heavy periods, lasting for three days of heavy flow.  She has complained of epigastric band-like pain and is being evaluated to rule out cardiac causes.  ENDOSCOPE:  Fujinon single-channel videoscope.  SEDATION:  Versed 5 mg IV, Demerol 50 mg IV.  FINDINGS/DESCRIPTION OF PROCEDURE:  The Fujinon single-channel videoscope was passed under direct vision through the posterior pharynx into the esophagus. The patient was monitored by pulse oximeter.  Her oxygen saturations were normal. he proximal and distal esophageal mucosa was unremarkable.  There was no hiatal hernia, no esophagitis, no esophageal varices.  STOMACH:  The stomach was insufflated with air and showed a small amount of coffee-ground specks in the greater curvature.  There were multiple pinpoint erosions in the gastric antrum which were also coated with coffee-ground material. The gastric outlet was normal.  Retroflexion of the  endoscope revealed a normal  fundus and cardia.  The CLO test was taken from the gastric antrum, to rule out H. pylori.  DUODENUM:  The duodenal bulb was mildly inflamed with some pinpoint erosions in the descending duodenum, but no evidence of bleeding.  The endoscope was then brought back into the stomach.  It was noted that the pylorus was somewhat spastic, but  there was no significant obstruction.  The patient tolerated the procedure well.  IMPRESSION: 1. Antral gastritis, status post CLO test. 2. A small amount of coffee-ground material in the stomach, indicating a    low-grade gastrointestinal blood loss. 3. Mild duodenitis.  COLONOSCOPY ENDOSCOPE:  Fujinon single-channel videoscope.  SEDATION:  Additional Versed 7.5 mg IV, Demerol additional 25 mg IV.  FINDINGS/DESCRIPTION OF PROCEDURE:  The Fujinon single-channel videoscope was passed under direct vision from the rectum to the sigmoid colon.  The patient was again monitored by pulse oximeter.  Oxygen saturation fluctuated between 90%-98% on 4 L of nasal O2.  The anal canal showed a superficial anal fissure.  There were  first-grade hemorrhoids which were confirmed by retroflexing the endoscope in the rectal ampulla.  A bluish discoloration of the hemorrhoids indicated some vascular congestion.  The fissure had a bright red discoloration but did not show any  active bleeding.  There were a few scattered diverticulosis in the sigmoid colon, but essentially the mucosa was normal, and there were no polyps throughout the colon. the colonoscope passed easily through the descending colon, the splenic flexure, the transverse colon, hepatic flexure to the ascending colon, to the cecum. The cecal pouch and ileocecal valve were visualized fully, and showed normal appendiceal opening, and a normal ileocecal valve.  The colonoscope was then retracted and the colon decompressed.  The patient tolerated the procedure  well.  IMPRESSION: 1. Anal fissure and first-grade hemorrhoids. 2. Minimal diverticulosis of the left colon.  PLAN:  The patients GI blood loss may be related to antral gastritis, as well as to the presence of an anal fissure and first-grade hemorrhoids.  Her blood loss  also could be compounded by heavy periods.  The patient has been taken off of the Coumadin and may not return on Coumadin.  We will resume her iron supplements at this time and follow her H&H.  If the iron deficiency anemia continues, despite no evidence of GI blood loss, then a gynecologic evaluation may be justified, for he consideration of a hysterectomy. DD:  04/29/00 TD:  05/03/00 Job: [id number]  This report was created from the original endoscopy report, which was reviewed and signed by the above listed endoscopist.

## 2011-01-08 NOTE — Letter (Signed)
Summary: New Patient letter  [facility]  [address] [place], [place] [address]   Phone: [phone]  Fax: [phone]       04/13/2010 MRN: [id number]  [facility] [address] [place], [place]  [address]  Dear Ms. Carder,  Welcome to the Gastroenterology Division at [facility].    You are scheduled to see Dr. [name] on 04-19-10 at 11:30am, on the 3rd floor at [facility], [address]  We ask that you try to arrive at our office 15 minutes prior to your appointment time to allow for check-in.  We would like you to complete the enclosed self-administered evaluation form prior to your visit and bring it with you on the day of your appointment.  We will review it with you.  Also, please bring a complete list of all your medications or, if you prefer, bring the medication bottles and we will list them.  Please bring your insurance card so that we may make a copy of it.  If your insurance requires a referral to see a specialist, please bring your referral form from your primary care physician.  Co-payments are due at the time of your visit and may be paid by cash, check or credit card.     Your office visit will consist of a consult with your physician (includes a physical exam), any laboratory testing he/she may order, scheduling of any necessary diagnostic testing (e.g. x-ray, ultrasound, CT-scan), and scheduling of a procedure (e.g. Endoscopy, Colonoscopy) if required.  Please allow enough time on your schedule to allow for any/all of these possibilities.    If you cannot keep your appointment, please call [phone] to cancel or reschedule prior to your appointment date.  This allows [place] the opportunity to schedule an appointment for another patient in need of care.  If you do not cancel or reschedule by 5 p.m. the business day prior to your appointment date, you will be charged a $50.00 late cancellation/no-show fee.    Thank you for  choosing [facility] for your medical needs.  We appreciate the opportunity to care for you.  Please visit [place] at our website  to learn more about our practice.                     Sincerely,                                                             The Gastroenterology Division   Appended Document: New Patient letter Letter mailed to patient.

## 2011-01-08 NOTE — Assessment & Plan Note (Signed)
Summary: f/u chronic issues   Vital Signs:  Patient profile:   60 year old female Height:      63.5 inches Weight:      249 pounds BMI:     43.57 Temp:     98.0 degrees F oral Pulse rate:   70 / minute BP sitting:   192 / 96  (right arm)  Vitals Entered By: Tessie Fass CMA (May 02, 2010 8:44 AM)  Serial Vital Signs/Assessments:  Time      Position  BP       Pulse  Resp  Temp     By 8:57 AM             160/98                         Marisue Ivan  MD  Comments: 8:57 AM left arm, sitting, manual By: Marisue Ivan  MD   CC: f/u of chronic issues Is Patient Diabetic? Yes Pain Assessment Patient in pain? yes     Location: right knee Intensity: 8   Primary Care Provider:  Marisue Ivan  MD  CC:  f/u of chronic issues.  History of Present Illness: 60yo F here for f/u chronic issues  HTN: No adverse effects from medication.  Not checking it regularly.  Was well controlled at last visit.  No dizziness, HA, CP, palpitations, or swelling.  Thinks it may be elevated today b/c of recent stressors.  HLD: Tolerating medication.  Started the pravastatin on 02/20/2010.   No adverse effects.  No muscle pain or abd pain.  DM:  Currently on Metformin.  No adverse effects.  No hypoglycemic events.  No paresthesia or peripheral nerve pain.  CBGs checked 1 time a day and typically run b/w 90s-150s.  Hearing deficit on right ear: x several weeks.  No recent illness.  States that sounds are muffled and irritated in the ear.    GI issues: Followed by Dr. Juanda Chance s/p emergent visit for abd pain.  Recent imaging conveyed no indication of SBO.    Preventative: She is scheduled for colonoscopy on 05/22/10 per Dr. Juanda Chance.   Habits & Providers  Alcohol-Tobacco-Diet     Tobacco Status: quit  Current Medications (verified): 1)  Metformin Hcl 1000 Mg Tabs (Metformin Hcl) .... Take 1 Tablet By Mouth Two Times A Day 2)  Lisinopril-Hydrochlorothiazide 20-25 Mg  Tabs  (Lisinopril-Hydrochlorothiazide) .Marland Kitchen.. 1 Tab By Mouth Daily. 3)  Bayer Childrens Aspirin 81 Mg Chew (Aspirin) .... Take 1 Tablet By Mouth Once A Day 4)  Miralax   Powd (Polyethylene Glycol 3350) .Marland Kitchen.. 1 Packet in A Glass of Water Daily For Constipation As Needed 5)  Pravastatin Sodium 80 Mg Tabs (Pravastatin Sodium) .Marland Kitchen.. 1 Tab By Mouth Daily 6)  Calcium-Vitamin D 500-200 Mg-Unit Tabs (Calcium Carbonate-Vitamin D) .... One Tablet By Mouth Two Times A Day 7)  Oxycodone-Acetaminophen 5-325 Mg Tabs (Oxycodone-Acetaminophen) .... Take One-Two By Mouth As Needed Abdominal Pain  Allergies (verified): 1)  ! * Skelaxin  Past History:  Past Medical History: h/o isolated PAF GI bleed on Coumadin h/o Pica HTN DM T2 Depression hld  Past Surgical History: Total Abdominal Hysterectomy Tubal Ligation  Social History: Smoking Status:  quit  Review of Systems      See HPI  Physical Exam  General:  VS Reviewed and Rechecked. Well appearing, NAD.  Ears:  Right ear w/ cerumen and foreign debris Neck:  supple, full ROM, no goiter  or mass  Lungs:  Normal respiratory effort, chest expands symmetrically. Lungs are clear to auscultation, no crackles or wheezes. Heart:  Normal rate and regular rhythm. S1 and S2 normal without gallop, murmur, click, rub or other extra sounds. Abdomen:  Soft, NT, ND, no HSM, active BS  Extremities:  no edema Neurologic:  no focal deficits   Impression & Recommendations:  Problem # 1:  HYPERTENSION, BENIGN SYSTEMIC (ICD-401.1) Assessment Deteriorated  Worsened.  Not at goal. Possible that it could be due to situational stressors.  Will have her track her BP for the next week before making any further changes.  Her updated medication list for this problem includes:    Lisinopril-hydrochlorothiazide 20-25 Mg Tabs (Lisinopril-hydrochlorothiazide) .Marland Kitchen... 1 tab by mouth daily.  Orders: [facility]- Est  Level 4 (99214)  Problem # 2:  HYPERCHOLESTEROLEMIA  (ICD-272.0) Assessment: Unchanged Will check LDL-direct today now that she has been on the pravastatin.  Her updated medication list for this problem includes:    Pravastatin Sodium 80 Mg Tabs (Pravastatin sodium) .Marland Kitchen... 1 tab by mouth daily  Orders: T-Lipoprotein (LDL cholesterol)  (29562-13086) [facility]- Est  Level 4 (57846)  Problem # 3:  DIABETES MELLITUS II, UNCOMPLICATED (ICD-250.00) Assessment: Unchanged  Last A1c 7.2% (02/2010). No changes to regimen at this time.  Her updated medication list for this problem includes:    Metformin Hcl 1000 Mg Tabs (Metformin hcl) .Marland Kitchen... Take 1 tablet by mouth two times a day    Lisinopril-hydrochlorothiazide 20-25 Mg Tabs (Lisinopril-hydrochlorothiazide) .Marland Kitchen... 1 tab by mouth daily.    Bayer Childrens Aspirin 81 Mg Chew (Aspirin) .Marland Kitchen... Take 1 tablet by mouth once a day  Orders: [facility]- Est  Level 4 (96295)  Problem # 4:  Preventive Health Care (ICD-V70.0) Assessment: Comment Only Colonoscopy set up for 05/22/10  Complete Medication List: 1)  Metformin Hcl 1000 Mg Tabs (Metformin hcl) .... Take 1 tablet by mouth two times a day 2)  Lisinopril-hydrochlorothiazide 20-25 Mg Tabs (Lisinopril-hydrochlorothiazide) .Marland Kitchen.. 1 tab by mouth daily. 3)  Bayer Childrens Aspirin 81 Mg Chew (Aspirin) .... Take 1 tablet by mouth once a day 4)  Miralax Powd (Polyethylene glycol 3350) .Marland Kitchen.. 1 packet in a glass of water daily for constipation as needed 5)  Pravastatin Sodium 80 Mg Tabs (Pravastatin sodium) .Marland Kitchen.. 1 tab by mouth daily 6)  Calcium-vitamin D 500-200 Mg-unit Tabs (Calcium carbonate-vitamin d) .... One tablet by mouth two times a day 7)  Oxycodone-acetaminophen 5-325 Mg Tabs (Oxycodone-acetaminophen) .... Take one-two by mouth as needed abdominal pain  Patient Instructions: 1)  Call me in 1 week to discuss your blood pressure.  If it is still higher than 140/90, we may have to make changes to your medication. 2)  We will check your cholesterol today after you  have started the pravastatin. 3)  Keep your appt for the colonoscopy.  Pre-scheduled appt 6/611 at 9am and procedure date is 05/22/10 at 9am   Prevention & Chronic Care Immunizations   Influenza vaccine: declined  (01/01/2008)   Influenza vaccine deferral: Refused  (02/20/2010)   Influenza vaccine due: Refused  (01/25/2009)    Tetanus booster: 12/11/2007: Tdap   Tetanus booster due: 12/10/2017    Pneumococcal vaccine: declined  (01/01/2008)   Pneumococcal vaccine due: None    H. zoster vaccine: Not documented  Colorectal Screening   Hemoccult: Done.  (08/13/2004)   Hemoccult due: Not Indicated    Colonoscopy: Diverticulosis  (04/29/2000)   Colonoscopy due: 05/2010  Other Screening   Pap smear: discussed -  s/p hysterectomy  (01/01/2008)   Pap smear due: Not Indicated    Mammogram: Normal  (05/16/2009)   Mammogram due: 05/2010    DXA bone density scan: Not documented   Smoking status: quit  (05/02/2010)  Diabetes Mellitus   HgbA1C: 7.2  (02/20/2010)   HgbA1C action/deferral: Ordered  (02/20/2010)   Hemoglobin A1C due: 03/31/2008    Eye exam: Not documented   Diabetic eye exam action/deferral: Deferred  (09/06/2009)    Foot exam: yes  (12/11/2007)   High risk foot: Not documented   Foot care education: Not documented   Foot exam due: 12/10/2008    Urine microalbumin/creatinine ratio: Not documented    Diabetes flowsheet reviewed?: Yes   Progress toward A1C goal: Unchanged  Lipids   Total Cholesterol: 226  (02/20/2010)   Lipid panel action/deferral: LDL Direct Ordered   LDL: 045  (02/20/2010)   LDL Direct: 166  (04/25/2009)   HDL: 54  (02/20/2010)   Triglycerides: 81  (02/20/2010)    SGOT (AST): 14  (02/20/2010)   BMP action: Ordered   SGPT (ALT): 12  (02/20/2010)   Alkaline phosphatase: 46  (02/20/2010)   Total bilirubin: 0.5  (02/20/2010)    Lipid flowsheet reviewed?: Yes   Progress toward LDL goal: Unchanged  Hypertension   Last Blood Pressure:  192 / 96  (05/02/2010)   Serum creatinine: 0.71  (02/20/2010)   BMP action: Ordered   Serum potassium 3.8  (02/20/2010)    Hypertension flowsheet reviewed?: Yes   Progress toward BP goal: Deteriorated  Self-Management Support :   Personal Goals (by the next clinic visit) :     Personal A1C goal: 7  (09/06/2009)     Personal blood pressure goal: 130/80  (09/06/2009)     Personal LDL goal: 100  (09/06/2009)    Patient will work on the following items until the next clinic visit to reach self-care goals:     Medications and monitoring: take my medicines every day, check my blood sugar, check my blood pressure, bring all of my medications to every visit  (05/02/2010)     Eating: drink diet soda or water instead of juice or soda, eat more vegetables, use fresh or frozen vegetables, eat foods that are low in salt, eat baked foods instead of fried foods, eat fruit for snacks and desserts, limit or avoid alcohol  (05/02/2010)     Activity: take a 30 minute walk every day  (09/06/2009)    Diabetes self-management support: CBG self-monitoring log, Written self-care plan, Education handout  (05/02/2010)   Diabetes care plan printed   Diabetes education handout printed    Hypertension self-management support: Written self-care plan  (05/02/2010)   Hypertension self-care plan printed.    Lipid self-management support: Written self-care plan  (05/02/2010)   Lipid self-care plan printed.  Appended Document: f/u chronic issues Hearing deficit improved s/p cerumen disimpaction.

## 2011-01-08 NOTE — Miscellaneous (Signed)
Summary: lower abd pain  Clinical Lists Changes states she has terrible abd pain & she is vomiting & blurry vision, sweating. she is a diabetic. 141 this am & has not been able to eat or drink anything. she just throws it back up. told her to call 911 now. she agreed to do this & go to hospital. .[place] RN  Apr 12, 2010 2:54 PM

## 2011-01-08 NOTE — Progress Notes (Signed)
Summary: prep ?'s  Phone Note Call from Patient Call back at [phone]   Caller: Patient Call For: Dr. Juanda Chance Reason for Call: Talk to Nurse Summary of Call: prep ?'s Initial call taken by: Vallarie Mare,  May 18, 2010 8:24 AM  Follow-up for Phone Call        Reviewed clear liquid diet and food elimination diet with pt. Follow-up by: Wyona Almas RN,  May 18, 2010 8:38 AM

## 2011-01-08 NOTE — Progress Notes (Signed)
Summary: triage  Phone Note Call from Patient Call back at Home Phone [phone]   Caller: Patient Summary of Call: pt is still in pain in her stomach - wants to know what she can do Initial call taken by: De Nurse,  May 10, 2010 4:33 PM  Follow-up for Phone Call        states the pain pills are not really helping & she is constipated. advised otc stool softner. states she cannot afford the miralax wants something else for the pain. has appt with GI monday Follow-up by: Golden Circle RN,  May 10, 2010 4:54 PM  Additional Follow-up for Phone Call Additional follow up Details #1::        will f/u as needed but recommend f/u with GI as scheduled Additional Follow-up by: Marisue Ivan  MD,  May 12, 2010 10:39 PM

## 2011-01-08 NOTE — Procedures (Signed)
Summary: Colonoscopy  Patient: [name] Note: All result statuses are Final unless otherwise noted.  Tests: (1) Colonoscopy (COL)   COL Colonoscopy           DONE     [facility]     [address]     [place], [place]  [address]           COLONOSCOPY PROCEDURE REPORT           PATIENT:  Meredith [name], Meredith [name]  MR#:  [id number]     BIRTHDATE:  [birth year], 59 yrs. old  GENDER:  female     ENDOSCOPIST:  Hedwig Morton. Juanda Chance, MD     REF. BY:     PROCEDURE DATE:  05/22/2010     PROCEDURE:  Colonoscopy 30865     ASA CLASS:  Class I     INDICATIONS:  abdominal pain, abnormal CT of abdomen ER eval of     intermittent abd. pain, segment of small bowl abnormal on CT scan,     she has a ventral hernia     colon 2001 normal, hx of pelvic surgery     MEDICATIONS:   Versed 9 mg, Fentanyl 75 mcg           DESCRIPTION OF PROCEDURE:   After the risks benefits and     alternatives of the procedure were thoroughly explained, informed     consent was obtained.  Digital rectal exam was performed and     revealed no rectal masses.   The LB CF-[id number] endoscope     was introduced through the anus and advanced to the cecum, which     was identified by both the appendix and ileocecal valve, without     limitations.  The quality of the prep was good, using MiraLax.     The instrument was then slowly withdrawn as the colon was fully     examined.     <<PROCEDUREIMAGES>>           FINDINGS:  Four polyps were found throughout the colon. 4     diminutiv epolyps, two at 20 cm, 2 arounf 70 cm, each 2-3 mm The     polyps were removed using cold biopsy forceps (see image2,     image10, image11, and image9).  Mild diverticulosis was found (see     image8, image3, and image4).  This was otherwise a normal     examination of the colon (see image6, image7, and image12).     Retroflexed views in the rectum revealed no abnormalities.    The     scope was then withdrawn from the patient and the procedure     completed.     COMPLICATIONS:  None     ENDOSCOPIC IMPRESSION:     1) Four polyps throughout the colon     2) Mild diverticulosis     3) Otherwise normal examination     nothin to account for abdominal pain     RECOMMENDATIONS:     1) Await pathology results     2) High fiber diet.     REPEAT EXAM:  In 7 - 10 year(s) for.           ______________________________     Hedwig Morton. Juanda Chance, MD           CC:           n.     eSIGNED:   Hedwig Morton. Sherion Dooly at 05/22/2010 09:35  AM           [place], Heal, [id number]  Note: An exclamation mark (!) indicates a result that was not dispersed into the flowsheet. Document Creation Date: 05/22/2010 9:36 AM _______________________________________________________________________  (1) Order result status: Final Collection or observation date-time: 05/22/2010 09:25 Requested date-time:  Receipt date-time:  Reported date-time:  Referring Physician:   Ordering Physician: [name] [id number]) Specimen Source:  Source: Meredith [name] Order Number: [id number] Lab site:   Appended Document: Colonoscopy     Procedures Next Due Date:    Colonoscopy: 05/2020

## 2011-01-08 NOTE — Assessment & Plan Note (Signed)
Summary: toe & toenail turning dark/diabetic,tcb   Vital Signs:  Patient profile:   60 year old [name] Weight:      253.3 pounds Temp:     98.5 degrees F oral Pulse rate:   64 / minute Pulse rhythm:   regular BP sitting:   164 / 98  (right arm) Cuff size:   large  Vitals Entered By: Loralee Pacas CMA (September 13, 2010 10:34 AM) Is Patient Diabetic? Yes   Primary Care Provider:  Majel Homer MD   History of Present Illness: Meredith [name] here with c/o toe discoloration and knee pain.  Toes:  L middle toenail dark, yellowish, thickened.  Non-tender.  No drainage, not red, no breaks in skin.  Other toenails have similar problems.  Has never been treated for onychomycosis.  R knee pain:  XR showed severe DJD, has considered TKA in the past.  Has never had joint injection as she has been somewhat hesitant to do this.  Amenable to trying injection today.  Habits & Providers  Alcohol-Tobacco-Diet     Tobacco Status: quit     Tobacco Counseling: to remain off tobacco products  Current Medications (verified): 1)  Metformin Hcl 1000 Mg Tabs (Metformin Hcl) .... Take 1 Tablet By Mouth Two Times A Day 2)  Lisinopril-Hydrochlorothiazide 20-25 Mg  Tabs (Lisinopril-Hydrochlorothiazide) .Marland Kitchen.. 1 Tab By Mouth Daily. 3)  Bayer Childrens Aspirin 81 Mg Chew (Aspirin) .... Take 1 Tablet By Mouth Once A Day 4)  Miralax   Powd (Polyethylene Glycol 3350) .Marland Kitchen.. 1 Packet in A Glass of Water Daily For Constipation As Needed 5)  Pravastatin Sodium 80 Mg Tabs (Pravastatin Sodium) .Marland Kitchen.. 1 Tab By Mouth Daily 6)  Calcium-Vitamin D 500-200 Mg-Unit Tabs (Calcium Carbonate-Vitamin D) .... One Tablet By Mouth Two Times A Day 7)  Oxycodone-Acetaminophen 5-325 Mg Tabs (Oxycodone-Acetaminophen) .... Take One-Two By Mouth As Needed Abdominal Pain 8)  Meloxicam 15 Mg Tabs (Meloxicam) .Marland Kitchen.. 1 Tab By Mouth Daily With Food For Arthritis 9)  Lamisil 250 Mg Tabs (Terbinafine Hcl) .... One Tab By Mouth Daily X 12  Weeks  Allergies (verified): 1)  ! * Skelaxin  Review of Systems       See HPI  Physical Exam  General:  Well-developed,well-nourished,in no acute distress; alert,appropriate and cooperative throughout examination Lungs:  Normal respiratory effort, chest expands symmetrically. Lungs are clear to auscultation, no crackles or wheezes. Heart:  Normal rate and regular rhythm. S1 and S2 normal without gallop, murmur, click, rub or other extra sounds. Msk:  R Knee: Knees enlargedl to inspection with no erythema or effusion or obvious bony abnormalities. Palpation normal with no warmth or joint line tenderness or patellar tenderness or condyle tenderness. ROM normal in flexion and extension and lower leg rotation. Ligaments with solid consistent endpoints including ACL, PCL, LCL, MCL. Negative Mcmurray's and provocative meniscal tests. Non painful patellar compression but she does have patellar grind. Patellar and quadriceps tendons unremarkable. Hamstring and quadriceps strength is normal.   Extremities:  Toenails thickened, yellowish, non-tender.  No drainage, no erythema.  No skin ulcers. Additional Exam:  Consent obtained and verified. Sterile betadine prep. Furthur cleansed with alcohol. Topical analgesic spray: Ethyl chloride. Joint:R knee Approached in typical fashion with: 25g needle Completed without difficulty Meds:1cc kenalog 40, 3cc marcaine, 4cc lidocaine 1% Needle:25g Aftercare instructions and Red flags advised.     Impression & Recommendations:  Problem # 1:  ONYCHOMYCOSIS, TOENAILS (ICD-110.1) Assessment New With highly dystrophic nails without visible onycholysis. Checking  LFTs, lamisil daily x 12 weeks. RTC to recheck in 3 months.  Her updated medication list for this problem includes:    Lamisil 250 Mg Tabs (Terbinafine hcl) ..... One tab by mouth daily x 12 weeks  Orders: [facility]- Est  Level 4 (99214) Comp Met-[facility] (04540-98119)  Problem # 2:  KNEE PAIN,  RIGHT (ICD-719.46) Assessment: Improved Injected knee, pt with immediate improvement in pain, ambulatory without pain. Can re-inject q3 months prn.  Her updated medication list for this problem includes:    Bayer Childrens Aspirin 81 Mg Chew (Aspirin) .Marland Kitchen... Take 1 tablet by mouth once a day    Oxycodone-acetaminophen 5-325 Mg Tabs (Oxycodone-acetaminophen) .Marland Kitchen... Take one-two by mouth as needed abdominal pain    Meloxicam 15 Mg Tabs (Meloxicam) .Marland Kitchen... 1 tab by mouth daily with food for arthritis  Orders: Injection, large joint- [facility] (14782)  Problem # 3:  OSTEOARTHRITIS, MULTI SITES (ICD-715.98) Assessment: Improved See #2.  Her updated medication list for this problem includes:    Bayer Childrens Aspirin 81 Mg Chew (Aspirin) .Marland Kitchen... Take 1 tablet by mouth once a day    Oxycodone-acetaminophen 5-325 Mg Tabs (Oxycodone-acetaminophen) .Marland Kitchen... Take one-two by mouth as needed abdominal pain    Meloxicam 15 Mg Tabs (Meloxicam) .Marland Kitchen... 1 tab by mouth daily with food for arthritis  Orders: [facility]- Est  Level 4 (95621) Injection, large joint- [facility] (30865)  Complete Medication List: 1)  Metformin Hcl 1000 Mg Tabs (Metformin hcl) .... Take 1 tablet by mouth two times a day 2)  Lisinopril-hydrochlorothiazide 20-25 Mg Tabs (Lisinopril-hydrochlorothiazide) .Marland Kitchen.. 1 tab by mouth daily. 3)  Bayer Childrens Aspirin 81 Mg Chew (Aspirin) .... Take 1 tablet by mouth once a day 4)  Miralax Powd (Polyethylene glycol 3350) .Marland Kitchen.. 1 packet in a glass of water daily for constipation as needed 5)  Pravastatin Sodium 80 Mg Tabs (Pravastatin sodium) .Marland Kitchen.. 1 tab by mouth daily 6)  Calcium-vitamin D 500-200 Mg-unit Tabs (Calcium carbonate-vitamin d) .... One tablet by mouth two times a day 7)  Oxycodone-acetaminophen 5-325 Mg Tabs (Oxycodone-acetaminophen) .... Take one-two by mouth as needed abdominal pain 8)  Meloxicam 15 Mg Tabs (Meloxicam) .Marland Kitchen.. 1 tab by mouth daily with food for arthritis 9)  Lamisil 250 Mg Tabs  (Terbinafine hcl) .... One tab by mouth daily x 12 weeks  Other Orders: A1C-[facility] (78469)  Patient Instructions: 1)  12 weeks of terbinafine for your toenails. 2)  Checking liver tests. 3)  Come back to see me in 3 months to see how you are doing. 4)  Injected your knee. 5)  -Dr. Karie Schwalbe. Prescriptions: LAMISIL 250 MG TABS (TERBINAFINE HCL) One tab by mouth daily x 12 weeks  #84 x 0   Entered and Authorized by:   Rodney Langton MD   Signed by:   Rodney Langton MD on 09/13/2010   Method used:   Print then Give to Patient   RxID:   [id number]   Laboratory Results   Blood Tests   Date/Time Received: September 13, 2010 10:16 AM  Date/Time Reported: September 13, 2010 10:29 AM   HGBA1C: 6.9%   (Normal Range: Non-Diabetic - 3-6%   Control Diabetic - 6-8%)  Comments: ...............test performed by......Marland KitchenBonnie A. Swaz[place], MLS (ASCP)cm

## 2011-01-08 NOTE — Assessment & Plan Note (Signed)
Summary: POST ER FOLLOW-UP               Meredith [name]   History of Present Illness Visit Type: Initial Visit Primary GI MD: [name] MD Primary Provider: Marisue Ivan  MD Chief Complaint: Patient was in the ER for abdominal pain that sarted on the left side she states that the pain has resolved now. She  complains of chronic constipation and she takes Miralax as needed.  History of Present Illness:   This is a 60 year old [name] who was seen in the emergency room recently with severe crampy abdominal pain. A CT Scan showed a segment of the mid small bowel to be abnormal in appearance, consistent with segmental enteritis. It raised the question of Crohn's disease versus infection. Patient is doing well and remains asymptomatic. She has been having similar episodes every 3 months. An upper endoscopy and colonoscopy in 2001 showed gastritis and diverticulosis. Her pain is usually precipitated by eating over a count at work where she works standing. She has a history of a large pelvic mass and is status post exploratory laparoscopy and removal of a benign pelvic tumor in 2001. She has a known ventral hernia.   GI Review of Systems      Denies abdominal pain, acid reflux, belching, bloating, chest pain, dysphagia with liquids, dysphagia with solids, heartburn, loss of appetite, nausea, vomiting, vomiting blood, weight loss, and  weight gain.      Reports constipation.     Denies anal fissure, black tarry stools, change in bowel habit, diarrhea, diverticulosis, fecal incontinence, heme positive stool, hemorrhoids, irritable bowel syndrome, jaundice, light color stool, liver problems, rectal bleeding, and  rectal pain. Preventive Screening-Counseling & Management      Drug Use:  no.      Current Medications (verified): 1)  Metformin Hcl 1000 Mg Tabs (Metformin Hcl) .... Take 1 Tablet By Mouth Two Times A Day 2)  Lisinopril-Hydrochlorothiazide 20-25 Mg  Tabs  (Lisinopril-Hydrochlorothiazide) .Marland Kitchen.. 1 Tab By Mouth Daily. 3)  Bayer Childrens Aspirin 81 Mg Chew (Aspirin) .... Take 1 Tablet By Mouth Once A Day 4)  Miralax   Powd (Polyethylene Glycol 3350) .Marland Kitchen.. 1 Packet in A Glass of Water Daily For Constipation As Needed 5)  Pravastatin Sodium 80 Mg Tabs (Pravastatin Sodium) .Marland Kitchen.. 1 Tab By Mouth Daily 6)  Tylenol Extra Strength 500 Mg Tabs (Acetaminophen) .... 2 Tablets By Mouth As Needed 7)  Calcium-Vitamin D 500-200 Mg-Unit Tabs (Calcium Carbonate-Vitamin D) .... One Tablet By Mouth Two Times A Day 8)  Oxycodone-Acetaminophen 5-325 Mg Tabs (Oxycodone-Acetaminophen) .... Take One-Two By Mouth As Needed Abdominal Pain  Allergies (verified): 1)  ! * Skelaxin  Past History:  Past Medical History: h/o isolated PAF GI bleed on Coumadin h/o Pica L knee pain 719.46-DJD(medial) on plain films 6.07 HTN DM T2 Depression  Past Surgical History: Reviewed history from 04/18/2010 and no changes required. cardiolite - Neg, EF 61% - 06/24/2002, CT Abd - cystic pelvic mass - 07/09/2001, HIV negative - 12/09/1994, RUQ U/S - neg - 07/09/2001, TAH, BSO (fibroids) - 01/09/2002 Gastric emptying study negative 02/2008  Total Abdominal Hysterectomy Tubal Ligation  Family History: DM, ESRD- mom lung ca-father Family History of Stomach Cancer: Sister No FH of Colon Cancer: Family History of Clotting disorder: neice  Social History: works at [employer];  Denies tobacco/etoh/drug use;  Lives alone, has grown children; 1 son dead, 1 son in jail. Daily Caffeine Use 2 per day Illicit Drug Use - no  Drug Use:  no  Review of Systems       The patient complains of allergy/sinus, arthritis/joint pain, back pain, change in vision, confusion, depression-new, fatigue, headaches-new, hearing problems, itching, muscle pains/cramps, night sweats, shortness of breath, swelling of feet/legs, and thirst - excessive.         Pertinent positive and negative review of systems were  noted in the above HPI. All other ROS was otherwise negative.   Vital Signs:  Patient profile:   60 year old female Height:      63.5 inches Weight:      246.4 pounds BMI:     43.12 Pulse rate:   64 / minute Pulse rhythm:   regular BP sitting:   142 / 88  (right arm) Cuff size:   regular  Vitals Entered By: Harlow Mares CMA Duncan Dull) (Apr 19, 2010 10:20 AM)  Physical Exam  General:  alert, oriented and in no distress. Obese. Eyes:  PERRLA, no icterus. Mouth:  No deformity or lesions, dentition normal. Neck:  Supple; no masses or thyromegaly. Lungs:  Clear throughout to auscultation. Heart:  Regular rate and rhythm; no murmurs, rubs,  or bruits. Abdomen:  obese abdomen with well healed surgical scar and  small ventral hernia in the upper part of the vertical incision. Bowel sounds are hyperactive in upper abdomen. There is mild tenderness along the hernia. Rectal:  soft Hemoccult negative stool. Extremities:  No clubbing, cyanosis, edema or deformities noted. Skin:  Intact without significant lesions or rashes. Psych:  Alert and cooperative. Normal mood and affect.   Impression & Recommendations:  Problem # 1:  HERNIA, VENTRAL (ICD-553.20) Patient has been having episodes of crampy abdominal pain requiring emergent evaluation. We need to rule out an intermittent small bowel obstruction due to adhesions. Patient has a history of extensive pelvic surgery. She is thought to have incarceration of the ventral hernia which appears to be rather small on my exam. We will proceed with a small bowel follow-through to assess the jejunal segment of which was abnormal on a CT scan. Crohn's disease is very unlikely.  Problem # 2:  Hx of HEMORRHOIDS (ICD-455.6) Patient is status post remote hemorrhoidectomy.  Problem # 3:  ENTERITIS (ICD-558.9) Patient has symptomatically resolved. I suspect Crohn's disease  or inflammatory process in the jejunum. We need to proceed with a small bowel  follow-through.  Other Orders: Small Bowel Follow Through (Sm Bowel FT)  Patient Instructions: 1)  small bowel follow-through to look for a partial small bowel obstruction. 2)  Continue MiraLax. 3)  Consider a colonoscopy, last exam 2001. 4)  Copy sent to : Dr Elpidio Eric 5)  The medication list was reviewed and reconciled.  All changed / newly prescribed medications were explained.  A complete medication list was provided to the patient / caregiver.

## 2011-01-08 NOTE — Miscellaneous (Signed)
Summary: Orders Update  Clinical Lists Changes  Problems: Added new problem of ENCOUNTER FOR LONG-TERM USE OF OTHER MEDICATIONS (ICD-V58.69) Orders: Added new Test order of CBC-[facility] (09811) - Signed Added new Test order of B12-[facility] [phone]) - Signed Added new Test order of CBC-[facility] (13086) - Signed Added new Test order of B12-[facility] ([id number]) - Signed  Ok per Dr. Louanne Belton

## 2011-01-08 NOTE — Progress Notes (Signed)
Summary: Post ER Follow-up  Phone Note From Other Clinic   Caller: ER MD Summary of Call: She is at [facility] ER.  CT scan showed segment of SB thickening, c/w enteritis.  ER MD said she wants to go home (normal CBC, normal BMEt) and he feels that is safe.  She told him she's been seen by [facility] but she isn't sure who it was.    she needs rov with whomever she's seen before, in next 2-3 weeks or with PA Initial call taken by: Rachael Fee MD,  Apr 12, 2010 8:36 PM  Follow-up for Phone Call        [facility] checked [facility] and DR.Sonjia Wilcoxson did EGD/COLON on pt. at hosp.on 04/29/2000.Was never seen in office. Follow-up by: Teryl Lucy RN,  Apr 13, 2010 9:50 AM  Additional Follow-up for Phone Call Additional follow up Details #1::        Pt. is scheduled to see Dr.Cesily Cuoco on 04-19-10 at 11:30am, pt. agrees. Pt. instructed to call back as needed.  Additional Follow-up by: Laureen Ochs LPN,  Apr 13, 5408 10:08 AM

## 2011-01-08 NOTE — Discharge Summary (Signed)
Summary: Bleeding Gastric Ulcer                    [place]  Patient:    Meredith [name], Meredith [name]                      MRN: [id number] Adm. Date:  [id number] Disc. Date: [id number] Attending:  McDiarmid, Leighton Roach. Dictator:   Creed Copper. Cornelius Moras CC:         Huey Bienenstock McDiarmid, M.D.                           Discharge Summary  DISCHARGE DIAGNOSES:  1. Bleeding gastric ulcer.  2. Chest pain.  3. Hypercholesterolemia.  4. Diabetes type 2.  5. Obesity.  6. Iron deficiency anemia.  7. Hypertension.  8. Paroxysmal supraventricular tachycardia.  9. Atrial fibrillation. 10. Chronic osteoarthritis. 11. Rotator cuff strain.  DISCHARGE MEDICATIONS:  1. Baby aspirin one p.o. q.d.  2. Prevacid 30 mg b.i.d.  3. Albuterol two puffs p.r.n. shortness of breath.  4. Colace 100 mg b.i.d.  5. Iron 325 mg t.i.d.  6. Glucophage 500 mg q.d.  7. Lotensin 10 mg q.d.  8. Tiazac 120 mg q.d.  9. Zocor 20 mg q.d. 10. Anusol suppository b.i.d. p.r.n.  SPECIAL INSTRUCTIONS:  The patient is instructed to stop taking her Coumadin and Pepcid.  FOLLOWUP:  Follow up with Dr. Lorenz Coaster on Monday, June 4, at 2:25 p.m.  Follow up with [facility] for Pers[facility] Cardiolite on May 30, at 1:15 p.m.  HISTORY OF PRESENT ILLNESS:  The patient is a 60 year old African-American female recently discharged with a new diagnosis of atrial fibrillation with a prescription for Coumadin.  PT was checked and was high at the outpatient clinic visit the week prior to admission and the dose was decreased.  On Friday, the family noted bruising and called the doctor on call who instructed to take Coumadin on Saturday or Sunday.  On Saturday in the evening, the patient noted tarry stool and at the end of the evening noted bright red blood per rectum.  The patient also complains of tingling in chest and on and off with radiation of tingling to bilateral arms with issues that she has occasional  nausea, diaphoresis and shortness of breath.  The patient states that she feels her chest tingling with heartburn.  REVIEW OF SYSTEMS:  Positive for mild nausea and shortness of breath. Positive tingling in her chest.  Occasional shortness of breath.  Positive melena, bright red blood per rectum, over use of Milk of Magnesia and heartburn.  Skin is positive for bruising in the left deltoid area and her right pannus.  Blood sugars have been running around 160.  Positive for use of Coumadin.  PAST MEDICAL HISTORY:  See above.  The patient has been diagnosed with PICA.  SOCIAL HISTORY:  The patient works at the [employer].  She has moderate exercise.  Denies alcohol, tobacco or drug use.  She lives alone and has four grown children.  FAMILY HISTORY:  She has a father with lung cancer.  Sister died of cancer of the stomach.  Mother had diabetes mellitus and ESRD.  ALLERGIES:  SKELAXIN.  ACE INHIBITOR gives her a cough.  PHYSICAL EXAMINATION:  VITAL SIGNS:  Temperature afebrile, blood pressure 113/60, heart rate 72, respiratory rate 20, saturations 96% on room air.  Physical exam was completely within  normal limits except for a 10 cm irregular bruise on the left arm.  Rectal positive per ER MD and there were no masses noted.  LABORATORY DATA AND X-RAY FINDINGS:  WBC 8.0, hemoglobin 10.0, hematocrit 31.6, platelets 270.  PT 30.1, INR 5.0, PTT 58.  Sodium 136, potassium 3.9, chloride 105, bicarb 28, BUN 12, creatinine 0.6, glucose 169, calcium 9.1. KUB showed no acute disease, question early ileus.  Chest x-ray showed no apparent disease and no cardiomegaly.  EKG showed no acute changes.  HOSPITAL COURSE:  #1 - GI BLEED:  The patient was admitted with serial CBCs which remained stable throughout hospitalization.  She was examined by Dr. Juanda Chance from GI who performed an EGD.  This showed a normal esophagus with multiple erosions in the stomach and some erosions in the duodenum.   CLOtest was pending at discharge.  Dr. Juanda Chance also performed a colonoscopy on this 59 year old female and found first-degree hemorrhoids, anal fissure and minimal diverticulitis with no masses or polyps.  The patient was prescribed Prevacid and Anusol for hemorrhoids.  She is to follow up with her primary MD for further evaluation of this.  #2 - CARDIAC:  The patient had this complaint of tingling which is probable attributable to her GI issues.  Although, in this patient with multiple other medical problems, risk strategy is probably indicated.  Dr. Elsie Lincoln saw the patient and agreed to schedule a Cardiolite as an outpatient.  The patient is discharged with instructions for this.  #3 - DIABETES MELLITUS:  The patient has poor control currently although the Glucophage is a new edition to her medication list.  This should be titrated up as an outpatient as indicated.  #4 - HYPERTENSION:  The patient was stable throughout hospitalization.  #5 - ANEMIA:  This was felt to be secondary to GI bleed.  CBC remained stable throughout hospitalization.  She was discharged with iron. DD:  05/20/00 TD:  05/22/00 Job: [id number]

## 2011-01-08 NOTE — Miscellaneous (Signed)
Summary: needs appt  Clinical Lists Changes LM for her to call. we need to get her an appt with pcp for DM supply company. the forms are in pcp chart box.Golden Circle RN  April 03, 2010 3:30 PM  Appended Document: needs appt pt is not interested in changing DM supply companies- she had told them that - pls discard any info this company is sending.

## 2011-01-08 NOTE — Progress Notes (Signed)
Summary: triage  Phone Note Call from Patient Call back at Home Phone [phone]   Caller: Patient Summary of Call: pain in stomach/pain in knee (fell on job) Initial call taken by: De Nurse,  June 05, 2010 8:50 AM  Follow-up for Phone Call        fell yesterday at work. Knee went out. has happened twice now stomach has nausea & pain at times. states this started after colonoscopy. has pain pills she takes. offered workin but told her we can only address one issue today as this is a workin. she is to decide what she wants to talk about. then states she has no money until Monday & wants to wait until then. told her she really needed to be seen. states she does not even have $5 & owes for a previous visit. told her we can give up to $10 towards her copay she will see Dr. Georgiana Shore at 11 Follow-up by: Golden Circle RN,  June 05, 2010 8:53 AM  Additional Follow-up for Phone Call Additional follow up Details #1::        THank you. Additional Follow-up by: Lamar Laundry, MD June 28th, 2011

## 2011-01-08 NOTE — Miscellaneous (Signed)
Summary: Procedure Consent  Procedure Consent   Imported By: Clydell Hakim 09/19/2010 11:22:02  _____________________________________________________________________  External Attachment:    Type:   Image     Comment:   External Document

## 2011-01-08 NOTE — Progress Notes (Signed)
Summary: Rx Ques  Phone Note Call from Patient Call back at [phone]   Caller: Patient Summary of Call: Needs to ask questions about taking 2 different kinds of pain meds. Initial call taken by: Clydell Hakim,  August 21, 2010 10:41 AM  Follow-up for Phone Call        patient states she  went to ER yesterday because of knee pain and was put in an immobilizer and was given vicodin. she wants to know if she can take the Meloxicam that she is taking for arthritis  and vicodin. will ask Dr. Jennette Kettle, preceptor today. Follow-up by: Theresia Lo RN,  August 21, 2010 11:30 AM  Additional Follow-up for Phone Call Additional follow up Details #1::        yes she should be able to take those together  Denny Levy MD  August 21, 2010 11:53 AM     Additional Follow-up for Phone Call Additional follow up Details #2::    told her ok to take both via her answering machine Follow-up by: Golden Circle RN,  August 21, 2010 12:37 PM

## 2011-01-08 NOTE — Letter (Signed)
Summary: Handout Printed  Printed Handout:  - Knee Injection

## 2011-01-08 NOTE — Consult Note (Signed)
Summary: Meredith [name]  [facility]   Imported By: De Nurse 02/14/2010 09:04:45  _____________________________________________________________________  External Attachment:    Type:   Image     Comment:   External Document  Appended Document: Meredith [name] Care Reviewed.  No diabetic retinopathy.

## 2011-01-08 NOTE — Progress Notes (Signed)
Summary: Question re: meds  Phone Note Call from Patient Call back at Home Phone [phone]   Reason for Call: Talk to Nurse Complaint: Urinary/GYN Problems Summary of Call: wants to speak with RN re: lipitor Initial call taken by: Knox Royalty,  February 22, 2010 9:23 AM     said that she cannot take lipitor makes her legs swell. pt stated that you perscribed prevastatin and wanted to know if thats what you want her to take since you said it was stronger than the lipitor. I told her to take that until she is told otherwise by dr.[email or web address]  Appended Document: Switch back to pravastatin 80mg  daily Discussed the situation with the patient.  Plan to switch back to pravastatin and max out on the dose to 80mg  daily.  will reassess LDL in 8 weeks.   Clinical Lists Changes  Medications: Changed medication from LIPITOR 20 MG TABS (ATORVASTATIN CALCIUM) 1 tab by mouth at bedtime to PRAVASTATIN SODIUM 40 MG TABS (PRAVASTATIN SODIUM) 2 tab by mouth daily

## 2011-01-08 NOTE — Progress Notes (Signed)
  Phone Note Call from Patient   Caller: Patient Call For: [phone] Summary of Call: Patient went to ED recently from fall she had at work.  Is concerned about why she's having problems with recent falls.  Had Xrays at hospital and would like to know the results.  Pt can't come this afternoon.  Would like to be worked in this am. Initial call taken by: Britta Mccreedy mcgregor  Follow-up for Phone Call        LM asking her to call back Follow-up by: Golden Circle RN,  August 28, 2010 9:46 AM  Additional Follow-up for Phone Call Additional follow up Details #1::        I checked in E chart & told her arthritis. she has a brace which helps but she does not want to wear it. very concerned & wants to know why the leg will suddenly "give out" told her it was unclear but to wear the brace as it prevents this. discussed meds & weight loss. told her I will send this to pcp & call her with md response.  she did not want an appt Additional Follow-up by: Golden Circle RN,  August 28, 2010 10:05 AM    Additional Follow-up for Phone Call Additional follow up Details #2::    I have reviewed Ms. Styron's chart and have noted the following.  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  An orthopedist has recommended a joint replacement, which she has declined, and we have offered her knee injections, which she has declined.  Her knee giving out is likely related to this condition but if she would like she can come in to the office for further evaluation.  It is likely, however, that we will not be able to add anything to this assessment.  We might consider getting her to work with PT, which might help this.  Otherwise she should wear her brace to avoid falls. Follow-up by: Majel Homer MD,  August 29, 2010 7:25 AM

## 2011-01-08 NOTE — Progress Notes (Signed)
Summary: phn msg  Phone Note Call from Patient Call back at [facility] Phone [phone]   Caller: Patient Summary of Call: Pt having colonscopy on Tues and needs to know how she should prep for this. Initial call taken by: Clydell Hakim,  May 17, 2010 4:57 PM  Follow-up for Phone Call        Explained to pt to call GI doctors office to get them to repeat her instructions.  Voiced understanding Follow-up by: Gladstone Pih,  May 17, 2010 5:34 PM

## 2011-01-08 NOTE — Miscellaneous (Signed)
   Clinical Lists Changes  Problems: Changed problem from ASTHMA, UNSPECIFIED (ICD-493.90) to ASTHMA, INTERMITTENT (ICD-493.90) 

## 2011-01-08 NOTE — Letter (Signed)
Summary: [facility] Instructions  [facility]  [address] [place], [place] [address]   Phone: [phone]  Fax: [phone]       JUSTYNA TIMONEY    61-Jan-1952    MRN: [id number]       Procedure Day Dorna Bloom:  Jake Shark  05/22/10     Arrival Time:  8:00AM     Procedure Time:  9:00AM     Location of Procedure:                    Juliann Pares  [facility] (4th Floor)    PREPARATION FOR COLONOSCOPY WITH MIRALAX  Starting 5 days prior to your procedure 05/17/10 do not eat nuts, seeds, popcorn, corn, beans, peas,  salads, or any raw vegetables.  Do not take any fiber supplements (e.g. Metamucil, Citrucel, and Benefiber). ____________________________________________________________________________________________________   THE DAY BEFORE YOUR PROCEDURE         DATE: 05/21/10  DAY: MONDAY  1   Drink clear liquids the entire day-NO SOLID FOOD  2   Do not drink anything colored red or purple.  Avoid juices with pulp.  No orange juice.  3   Drink at least 64 oz. (8 glasses) of fluid/clear liquids during the day to prevent dehydration and help the prep work efficiently.  CLEAR LIQUIDS INCLUDE: Water Jello Ice Popsicles Tea (sugar ok, no milk/cream) Powdered fruit flavored drinks Coffee (sugar ok, no milk/cream) Gatorade Juice: apple, white grape, white cranberry  Lemonade Clear bullion, consomm, broth Carbonated beverages (any kind) Strained chicken noodle soup Hard Candy  4   Mix the entire bottle of Miralax with 64 oz. of Gatorade/Powerade in the morning and put in the refrigerator to chill.  5   At 3:00 pm take 2 Dulcolax/Bisacodyl tablets.  6   At 4:30 pm take one Reglan/Metoclopramide tablet.  7  Starting at 5:00 pm drink one 8 oz glass of the Miralax mixture every 15-20 minutes until you have finished drinking the entire 64 oz.  You should finish drinking prep around 7:30 or 8:00 pm.  8   If you are nauseated, you may take the 2nd Reglan/Metoclopramide  tablet at 6:30 pm.        9    At 8:00 pm take 2 more DULCOLAX/Bisacodyl tablets.     THE DAY OF YOUR PROCEDURE      DATE:  05/22/10  DAY: Jake Shark  You may drink clear liquids until 7:00AM  (2 HOURS BEFORE PROCEDURE).   MEDICATION INSTRUCTIONS  Unless otherwise instructed, you should take regular prescription medications with a small sip of water as early as possible the morning of your procedure.  Diabetic patients - see separate instructions.   Additional medication instructions: _  Hold your lisinopril am of procedure_         OTHER INSTRUCTIONS  You will need a responsible adult at least 61 years of age to accompany you and drive you home.   This person must remain in the waiting room during your procedure.  Wear loose fitting clothing that is easily removed.  Leave jewelry and other valuables at home.  However, you may wish to bring a book to read or an iPod/MP3 player to listen to music as you wait for your procedure to start.  Remove all body piercing jewelry and leave at home.  Total time from sign-in until discharge is approximately 2-3 hours.  You should go home directly after your procedure and rest.  You can  resume normal activities the day after your procedure.  The day of your procedure you should not:   Drive   Make legal decisions   Operate machinery   Drink alcohol   Return to work  You will receive specific instructions about eating, activities and medications before you leave.   The above instructions have been reviewed and explained to me by   Clide Cliff, RN_______________________    I fully understand and can verbalize these instructions _____________________________ Date _______

## 2011-01-08 NOTE — Progress Notes (Signed)
Summary: prep ?'s again  Phone Note Call from Patient Call back at [phone]   Caller: Patient Call For: Dr. Juanda Chance Reason for Call: Talk to Nurse Summary of Call: prep ?'s again... procedure tomorrow Initial call taken by: Vallarie Mare,  May 21, 2010 9:09 AM  Follow-up for Phone Call        Answered pt's questions about prep. Follow-up by: Ezra Sites RN,  May 21, 2010 9:37 AM

## 2011-01-08 NOTE — Progress Notes (Signed)
Summary: prep ?'s/meds  Phone Note Call from Patient Call back at Home Phone [phone] Call back at [phone]   Caller: Patient Call For: Dr. Juanda Chance Reason for Call: Talk to Nurse Summary of Call: prep ?'s/meds Initial call taken by: Vallarie Mare,  May 15, 2010 10:04 AM  Follow-up for Phone Call        pt's questions were answered about times to take prep and meds day of procedure. Follow-up by: Ezra Sites RN,  May 15, 2010 10:37 AM

## 2011-01-08 NOTE — Miscellaneous (Signed)
Summary: colon [facility] 05/22/10 9:00/ v76.51/sheri  Clinical Lists Changes  Medications: Added new medication of MIRALAX   POWD (POLYETHYLENE GLYCOL 3350) As directed - Signed Added new medication of REGLAN 10 MG  TABS (METOCLOPRAMIDE HCL) As directed - Signed Added new medication of DULCOLAX 5 MG  TBEC (BISACODYL) As directed - Signed Rx of MIRALAX   POWD (POLYETHYLENE GLYCOL 3350) As directed;  #255 gms x 0;  Signed;  Entered by: Clide Cliff RN;  Authorized by: Hart Carwin MD;  Method used: [id number] [address] [place], [place], [place]  [address], Ph: [phone], Fax: [phone] Rx of REGLAN 10 MG  TABS (METOCLOPRAMIDE HCL) As directed;  #2 x 0;  Signed;  Entered by: Clide Cliff RN;  Authorized by: Hart Carwin MD;  Method used: [id number] [address], [place], [place], [place]  [address], Ph: [phone], Fax: [phone] Rx of DULCOLAX 5 MG  TBEC (BISACODYL) As directed;  #4 x 0;  Signed;  Entered by: Clide Cliff RN;  Authorized by: Hart Carwin MD;  Method used: [facility] [phone]*, [address], [place], [place], [place]  [address], Ph: [phone], Fax: [phone]    Prescriptions: DULCOLAX 5 MG  TBEC (BISACODYL) As directed  #4 x 0   Entered by:   Clide Cliff RN   Authorized by:   Hart Carwin MD   Signed by:   Clide Cliff RN on 05/14/2010   Method used:   [facility]        [facility] [address] [phone]* (retail)       [address]       [place], [place]  [address]       Ph: [phone]       Fax: [phone]   RxID:   [id number] REGLAN 10 MG  TABS (METOCLOPRAMIDE HCL) As directed  #2 x 0   Entered by:   Clide Cliff RN   Authorized by:   Hart Carwin MD   Signed by:   Clide Cliff RN on 05/14/2010   Method used:   [facility]        [facility] [address] [phone]* (retail)       [address]       [place], [place]  [address]       Ph: [phone]       Fax: [phone]   RxID:   [id number] MIRALAX   POWD (POLYETHYLENE GLYCOL 3350) As directed  #255 gms x 0   Entered by:   Clide Cliff RN   Authorized by:   Hart Carwin MD   Signed by:   Clide Cliff RN on 05/14/2010   Method used:   [facility]        [facility] [address] [phone]* (retail)       [address]       [place], [place]  [address]       Ph: [phone]       Fax: [phone]   RxID:   [id number]

## 2011-01-08 NOTE — Miscellaneous (Signed)
Summary: Pravastatin 80mg  qd #90 x1  Clinical Lists Changes  Medications: Changed medication from PRAVASTATIN SODIUM 40 MG TABS (PRAVASTATIN SODIUM) 2 tab by mouth daily to PRAVASTATIN SODIUM 80 MG TABS (PRAVASTATIN SODIUM) 1 tab by mouth daily - Signed Rx of PRAVASTATIN SODIUM 80 MG TABS (PRAVASTATIN SODIUM) 1 tab by mouth daily;  #90 x 1;  Signed;  Entered by: Marisue Ivan  MD;  Authorized by: Marisue Ivan  MD;  Method used: [facility] [phone]*, [address] [place], [place], [place]  [address], Ph: [phone], Fax: [phone]    Prescriptions: PRAVASTATIN SODIUM 80 MG TABS (PRAVASTATIN SODIUM) 1 tab by mouth daily  #90 x 1   Entered and Authorized by:   Marisue Ivan  MD   Signed by:   Marisue Ivan  MD on 03/14/2010   Method used:   [facility]ron[facility] to        [facility] [address] [phone]* (retail)       [address]       [place], [place]  [address]       Ph: [phone]       Fax: [phone]   RxID:   [id number]

## 2011-01-08 NOTE — Letter (Signed)
Summary: Patient Notice- Polyp Results  [facility]  [address] [place], [place] [address]   Phone: [phone]  Fax: [phone]        May 24, 2010 MRN: [id number]    [facility] [address] [place], [place]  [address]    Dear Ms. Yorks,  I am pleased to inform you that the colon polyp(s) removed during your recent colonoscopy was (were) found to be benign (no cancer detected) upon pathologic examination.The polyps consisted  of a normal  tissue ( no precancerous tissue)  I recommend you have a repeat colonoscopy examination in 10_ years to look for recurrent polyps, as having colon polyps increases your risk for having recurrent polyps or even colon cancer in the future.  Should you develop new or worsening symptoms of abdominal pain, bowel habit changes or bleeding from the rectum or bowels, please schedule an evaluation with either your primary care physician or with me.  Additional information/recommendations:  _x_ No further action with gastroenterology is needed at this time. Please      follow-up with your primary care physician for your other healthcare      needs.  __ Please call [phone] to schedule a return visit to review your      situation.  __ Please keep your follow-up visit as already scheduled.  __ Continue treatment plan as outlined the day of your exam.  Please call us if you are having persistent problems or have questions about your condition that have not been fully answered at this time.  Sincerely,  Hart Carwin MD  This letter has been electronically signed by your physician.  Appended Document: Patient Notice- Polyp Results letter mailed.

## 2011-01-08 NOTE — Miscellaneous (Signed)
  Clinical Lists Changes  Medications: Rx of PRAVASTATIN SODIUM 80 MG TABS (PRAVASTATIN SODIUM) 1 tab by mouth daily;  #90 x 1;  Signed;  Entered by: Majel Homer MD;  Authorized by: Majel Homer MD;  Method used: [facility] [phone] [address] [place], [place], [place]  [address], Ph: [phone], Fax: [phone]    Prescriptions: PRAVASTATIN SODIUM 80 MG TABS (PRAVASTATIN SODIUM) 1 tab by mouth daily  #90 x 1   Entered and Authorized by:   Majel Homer MD   Signed by:   Majel Homer MD on 09/13/2010   Method used:   [facility]        [facility] [address] [phone][facility] (retail)       [address]       [place], [place]  [address]       Ph: [phone]       Fax: [phone]   RxID:   [id number]

## 2011-01-10 NOTE — Assessment & Plan Note (Signed)
Summary: enteritis--ch.   History of Present Illness Visit Type: Follow-up Visit Primary GI MD: [name] MD Primary Provider: Rodney Langton, MD Requesting Provider: Rodney Langton, MD Chief Complaint: Enteritis, patient not having any pain now; has IBD in the past, small amount of blood on tissue, hemorrhoids in past History of Present Illness:   This is a 60 year old [name] who is here for a followup after an acute enteritis which was evaluated in the emergency room in November 2011. A CT scan of the abdomen in May 2011 showed enteritis and a fat-containing ventral hernia. A repeat CT scan of the abdomen in November 2011 showed loops of the thickened ileum and fat suggestive of inflammatory bowel disease. She is currently asymptomatic. She denies abdominal pain, diarrhea or food intolerance. There is a history of a bleeding gastric ulcer in May 2001. She was on Coumadin at that time. Her last colonoscopy in June 2011 showed 4 diminutive polyps, all of which showed polypoid tissue.   GI Review of Systems    Reports abdominal pain.     Location of  Abdominal pain: generalized.    Denies acid reflux, belching, bloating, chest pain, dysphagia with liquids, dysphagia with solids, heartburn, loss of appetite, nausea, vomiting, vomiting blood, weight loss, and  weight gain.        Denies anal fissure, black tarry stools, change in bowel habit, constipation, diarrhea, diverticulosis, fecal incontinence, heme positive stool, hemorrhoids, irritable bowel syndrome, jaundice, light color stool, liver problems, rectal bleeding, and  rectal pain.    Current Medications (verified): 1)  Metformin Hcl 1000 Mg Tabs (Metformin Hcl) .... Take 1 Tablet By Mouth Two Times A Day 2)  Lisinopril-Hydrochlorothiazide 20-25 Mg  Tabs (Lisinopril-Hydrochlorothiazide) .Marland Kitchen.. 1 Tab By Mouth Daily. 3)  Bayer Childrens Aspirin 81 Mg Chew (Aspirin) .... Take 1 Tablet By Mouth Once A Day 4)   Miralax   Powd (Polyethylene Glycol 3350) .Marland Kitchen.. 1 Packet in A Glass of Water Daily For Constipation As Needed 5)  Oxycodone-Acetaminophen 5-325 Mg Tabs (Oxycodone-Acetaminophen) .... Take One-Two By Mouth As Needed Abdominal Pain 6)  Meloxicam 15 Mg Tabs (Meloxicam) .Marland Kitchen.. 1 Tab By Mouth Daily With Food For Arthritis  Allergies (verified): 1)  ! * Skelaxin  Family History: Reviewed history from 04/19/2010 and no changes required. DM, ESRD- mom lung ca-father Family History of Stomach Cancer: Sister No FH of Colon Cancer: Family History of Clotting disorder: neice  Social History: Reviewed history from 04/19/2010 and no changes required. works at [employer];  Denies tobacco/etoh/drug use;  Lives alone, has grown children; 1 son dead, 1 son in jail. Daily Caffeine Use 2 per day Illicit Drug Use - no  Review of Systems       The patient complains of arthritis/joint pain and urination - excessive.  The patient denies allergy/sinus, anemia, anxiety-new, back pain, blood in urine, breast changes/lumps, change in vision, confusion, cough, coughing up blood, depression-new, fainting, fatigue, fever, headaches-new, hearing problems, heart murmur, heart rhythm changes, itching, menstrual pain, muscle pains/cramps, night sweats, nosebleeds, pregnancy symptoms, shortness of breath, skin rash, sleeping problems, sore throat, swelling of feet/legs, swollen lymph glands, thirst - excessive , urination - excessive , urination changes/pain, urine leakage, vision changes, and voice change.         Pertinent positive and negative review of systems were noted in the above HPI. All other ROS was otherwise negative.   Vital Signs:  Patient profile:   60 year old female Height:  63.5 inches Weight:      256.13 pounds BMI:     44.82 Pulse rate:   68 / minute Pulse rhythm:   regular BP sitting:   148 / 80  (left arm) Cuff size:   large  Vitals Entered By: June McMurray CMA Duncan Dull) (December 11, 2010  8:37 AM)  Physical Exam  General:  on oriented overweight Eyes:  nonicteric. Neck:  Supple; no masses or thyromegaly. Lungs:  Clear throughout to auscultation. Heart:  Regular rate and rhythm; no murmurs, rubs,  or bruits. Abdomen:  obese soft abdomen with normal active bowel sounds. No focal tenderness. No distention. Liver edge at costal margin. Extremities:  no edema. Skin:  Intact without significant lesions or rashes. Psych:  Alert and cooperative. Normal mood and affect.   Impression & Recommendations:  Problem # 1:  NONSPEC ABN FINDNG RAD & OTH EXAM ABDOMINAL AREA (ICD-793.6) Patient has had complete resolution of her acute enteritis. Patient remains asymptomatic. There is no reason to continue GI evaluation unless she develops specific symptoms. Inflammatory bowel disease is very unlikely in the absence of symptoms.  Problem # 2:  DIVERTICULOSIS, COLON (ICD-562.10) Patient is status post colonoscopy in June 2011. She had small diminutive polyps which showed polypoid mucosa only. A recall will be due in 10 years.  Problem # 3:  Hx of DUODENITIS (ICD-535.60) Patient has history of a bleeding gastric ulcer in 2001. She is currently asymptomatic.   Patient Instructions: 1)  no further GI evaluation is needed at this time. Patient will return on p.r.n. basis 2)  Copy sent to : Dr Benjamin Stain 3)  The medication list was reviewed and reconciled.  All changed / newly prescribed medications were explained.  A complete medication list was provided to the patient / caregiver.

## 2011-02-05 ENCOUNTER — Emergency Department (HOSPITAL_COMMUNITY)
Admission: EM | Admit: 2011-02-05 | Discharge: 2011-02-06 | Disposition: A | Discharge status: Home / Self Care | Payer: BC Managed Care – PPO | Attending: Emergency Medicine | Admitting: Emergency Medicine

## 2011-02-05 DIAGNOSIS — E119 Type 2 diabetes mellitus without complications: Secondary | ICD-10-CM | POA: Insufficient documentation

## 2011-02-05 DIAGNOSIS — J45909 Unspecified asthma, uncomplicated: Secondary | ICD-10-CM | POA: Insufficient documentation

## 2011-02-05 DIAGNOSIS — K219 Gastro-esophageal reflux disease without esophagitis: Secondary | ICD-10-CM | POA: Insufficient documentation

## 2011-02-05 DIAGNOSIS — Z79899 Other long term (current) drug therapy: Secondary | ICD-10-CM | POA: Insufficient documentation

## 2011-02-05 DIAGNOSIS — M129 Arthropathy, unspecified: Secondary | ICD-10-CM | POA: Insufficient documentation

## 2011-02-05 DIAGNOSIS — M25569 Pain in unspecified knee: Secondary | ICD-10-CM | POA: Insufficient documentation

## 2011-02-05 DIAGNOSIS — I10 Essential (primary) hypertension: Secondary | ICD-10-CM | POA: Insufficient documentation

## 2011-02-07 ENCOUNTER — Encounter: Payer: Self-pay | Admitting: Family Medicine

## 2011-02-07 ENCOUNTER — Ambulatory Visit (INDEPENDENT_AMBULATORY_CARE_PROVIDER_SITE_OTHER): Payer: BC Managed Care – PPO | Admitting: Family Medicine

## 2011-02-07 VITALS — BP 143/94 | HR 67 | Temp 98.3°F | Ht 63.0 in | Wt 250.8 lb

## 2011-02-07 DIAGNOSIS — M069 Rheumatoid arthritis, unspecified: Secondary | ICD-10-CM

## 2011-02-07 DIAGNOSIS — E669 Obesity, unspecified: Secondary | ICD-10-CM

## 2011-02-07 NOTE — Patient Instructions (Addendum)
Continue taking meloxicam for pain.  Use the vicodin you were given in the ER for severe pain.   It is very important for you to follow-up with your reheumatologist- Dr. Dareen Piano to prevent the worsening of your rheuamtoid arthritis Weight loss will help with your knee pain.   You decided that stopping sodas and juices would help with weight loss and I think only water is a great way to start! Make follow-up appt with Dr. Louanne Belton to discuss diabetes.

## 2011-02-07 NOTE — Progress Notes (Signed)
  Subjective:    Patient ID: Meredith [name], female    DOB: 02-20-1951, 60 y.o.   MRN: [id number]  HPI  Knee pain:  Known inflammatory arthritis (likely RA) lost to rheumatology follow-up here for worsening knee pain.  Hand pain is currently not an issue but continues to have stiffness.   Went to ER last night for worsening left knee pain.  Has known severe degenerative arthritis in right knee.  Was given prescription for vicodin for which she has not filled yet.  Taking meloxicam with some relief.  Described pain as severe, sometimes with giving way.  Stiffness.  Pain located at knee joint, points to entire knee as area of pain and states she rubs it to feel better.   No trauma, fever, chills  Obesity: Patient states she has lost a little weight but does no exercise and has not been watching her diet.  She is able to set goals for herself of which she talks about limiting sodas. Wt Readings from Last 3 Encounters:  02/07/11 250 lb 12.8 oz (113.762 kg)  12/11/10 256 lb 2.1 oz (116.18 kg)  10/24/10 249 lb (112.946 kg)     Review of Systems See HPI    Objective:   Physical Exam  Constitutional:       Obese, NAD  Musculoskeletal: She exhibits no edema.       Right knee: She exhibits decreased range of motion. She exhibits no swelling, no effusion and no bony tenderness.       Left knee: She exhibits decreased range of motion. She exhibits no swelling, no effusion and no bony tenderness.       Cervical back: She exhibits no tenderness.       Thoracic back: She exhibits no tenderness.       Lumbar back: She exhibits no tenderness.       Negative straight leg raise  Neurological: She displays normal reflexes.          Assessment & Plan:

## 2011-02-07 NOTE — Assessment & Plan Note (Signed)
Discussed with patient that inflammatory arthritis is different from "wear and tear" arthritis.  Will refer to her previous rheumatologist for continued discussion about DMARD.  Advised may continue to take meloxicam and vicodin as needed for pain.  Knee pain appears to be due to arthritis, not referred from hip or back. Has not had xrays of left knee, right knee with significant arthritis.

## 2011-02-07 NOTE — Assessment & Plan Note (Signed)
Discussed this in relation to her arthritis.  She plans on limiting beverages to only water, as one step toward weight loss.

## 2011-02-18 ENCOUNTER — Ambulatory Visit (INDEPENDENT_AMBULATORY_CARE_PROVIDER_SITE_OTHER): Payer: BC Managed Care – PPO | Admitting: Family Medicine

## 2011-02-18 ENCOUNTER — Encounter: Payer: Self-pay | Admitting: Family Medicine

## 2011-02-18 DIAGNOSIS — E119 Type 2 diabetes mellitus without complications: Secondary | ICD-10-CM

## 2011-02-18 DIAGNOSIS — E7800 Pure hypercholesterolemia, unspecified: Secondary | ICD-10-CM

## 2011-02-18 DIAGNOSIS — I10 Essential (primary) hypertension: Secondary | ICD-10-CM

## 2011-02-18 LAB — CONVERTED CEMR LAB
BUN: 17 mg/dL (ref 6–23)
CO2: 27 meq/L (ref 19–32)
Calcium: 9.7 mg/dL (ref 8.4–10.5)
Cholesterol: 223 mg/dL — ABNORMAL HIGH (ref 0–200)
Creatinine, Ser: 0.66 mg/dL (ref 0.40–1.20)
Glucose, Bld: 129 mg/dL — ABNORMAL HIGH (ref 70–99)
HDL: 55 mg/dL (ref >39)
Total Bilirubin: 0.3 mg/dL (ref 0.3–1.2)
Total CHOL/HDL Ratio: 4.1
Triglycerides: 79 mg/dL (ref <150)
VLDL: 16 mg/dL (ref 0–40)

## 2011-02-18 LAB — LIPID PANEL
Cholesterol: 223 mg/dL — ABNORMAL HIGH (ref 0–200)
LDL Cholesterol: 152 mg/dL — ABNORMAL HIGH (ref 0–99)
VLDL: 16 mg/dL (ref 0–40)

## 2011-02-18 LAB — COMPREHENSIVE METABOLIC PANEL
ALT: 12 U/L (ref 0–35)
BUN: 17 mg/dL (ref 6–23)
CO2: 27 mEq/L (ref 19–32)
Creat: 0.66 mg/dL (ref 0.40–1.20)
Total Bilirubin: 0.3 mg/dL (ref 0.3–1.2)

## 2011-02-18 MED ORDER — LISINOPRIL-HYDROCHLOROTHIAZIDE 20-25 MG PO TABS: 1.0000 | ORAL_TABLET | Freq: Every day | ORAL | Status: DC

## 2011-02-18 NOTE — Patient Instructions (Addendum)
It was great to see you today! I am sending in a refill on your blood pressure medication.  Please do your best to take this daily. I want you to record your blood pressure three times per week and record these.  Bring this in to your next visit. I am filling out your prescription for a blood sugar monitor.  Check your sugar 2-3 days per week in the morning. Come back some time next week for fasting blood work.

## 2011-02-20 LAB — COMPREHENSIVE METABOLIC PANEL
ALT: 14 U/L (ref 0–35)
AST: 19 U/L (ref 0–37)
Alkaline Phosphatase: 58 U/L (ref 39–117)
CO2: 29 mEq/L (ref 19–32)
Chloride: 102 mEq/L (ref 96–112)
GFR calc Af Amer: >60 mL/min (ref >60)
GFR calc non Af Amer: >60 mL/min (ref >60)
Potassium: 3.2 mEq/L — ABNORMAL LOW (ref 3.5–5.1)
Sodium: 138 mEq/L (ref 135–145)
Total Bilirubin: 0.3 mg/dL (ref 0.3–1.2)

## 2011-02-20 LAB — URINALYSIS, ROUTINE W REFLEX MICROSCOPIC
Bilirubin Urine: NEGATIVE
Hgb urine dipstick: NEGATIVE
Ketones, ur: NEGATIVE mg/dL
Nitrite: NEGATIVE
Urobilinogen, UA: 0.2 mg/dL (ref 0.0–1.0)
pH: 5.5 (ref 5.0–8.0)

## 2011-02-20 LAB — CBC
Hemoglobin: 14.3 g/dL (ref 12.0–15.0)
MCH: 26.4 pg (ref 26.0–34.0)
RBC: 5.41 MIL/uL — ABNORMAL HIGH (ref 3.87–5.11)
WBC: 12.7 10*3/uL — ABNORMAL HIGH (ref 4.0–10.5)

## 2011-02-20 LAB — DIFFERENTIAL
Basophils Absolute: 0 10*3/uL (ref 0.0–0.1)
Eosinophils Absolute: 0.2 10*3/uL (ref 0.0–0.7)
Eosinophils Relative: 1 % (ref 0–5)
Monocytes Absolute: 0.6 10*3/uL (ref 0.1–1.0)

## 2011-02-20 LAB — LIPASE, BLOOD: Lipase: 39 U/L (ref 11–59)

## 2011-02-20 NOTE — Assessment & Plan Note (Signed)
Pt has history of HLD but is not currently on any medications.  Will obtain FLP and treat as appropriate.

## 2011-02-20 NOTE — Assessment & Plan Note (Signed)
Has been previously moderately well controlled, goal would be 130/70.  Admits to poor compliance.  Discussed the importance of taking medication for long-term health and strategies for remembering to take medication.  Will recheck bp at next visit once she has been taking her medications and decide on therapy that is necessary.

## 2011-02-20 NOTE — Assessment & Plan Note (Signed)
Will provide with meter and test strips.  Check blood sugar 2-3 times per week.  Last A1c well controlled.  No evidence of hypoglycemia.  Will obtain a1c at next visit.

## 2011-02-20 NOTE — Progress Notes (Signed)
Subjective: Pt has no complaints today.  Requests a new blood sugar monitor as she is not currently able to check her blood sugar.  Has recently been having problems with increase in pain in her knees.  Is seeing her rheumatologist later this week for further help with this problem.  HYPERTENSION  Disease Monitoring: Blood pressure range-Not checking Chest pain- No      Dyspnea- No  Medications: Compliance- Poor Lightheadedness- No   Edema- No   DIABETES  Disease Monitoring: Blood Sugar ranges-Not monitoring Polyuria- No New Visual problems- No  Medications: Compliance- Yes Hypoglycemic symptoms- No    HYPERLIPIDEMIA  Disease Monitoring: See symptoms for Hypertension  Medications: Compliance- Not on anything currently RUQ pain- No  Muscle aches- No    ROS See HPI above   PMH Smoking Status noted        Objective:  Filed Vitals:   02/18/11 0844  BP: 190/97  Pulse: 63  Temp: 98.3 F (36.8 C)   Gen: NAD, alert, cooperative with exam Eyes: No evidence of papiledema, no a/v nicking, no hemorrhages CV: RRR, no MRG Resp: CTABL, no wheezes noted Ext: No edema noted, full ROM.  Slight pain on palpation of knees but no erythema/warmth or obvious effusion. Neuro: Alert and oriented, CN II-XII grossly intact

## 2011-02-21 ENCOUNTER — Telehealth: Payer: Self-pay | Admitting: Family Medicine

## 2011-02-22 MED ORDER — PRAVASTATIN SODIUM 20 MG PO TABS: 20.0000 mg | ORAL_TABLET | Freq: Every evening | ORAL | Status: DC

## 2011-02-22 NOTE — Telephone Encounter (Signed)
Talked with patient about risks/benefits of statin therapy.  Has been on pravastatin in the past with no problems, but was not able afford it in the past.  Is now able to afford it so will send in prescription for this.  Reports having been on either 40 or 80 daily.  Will start at 20mg  daily and, at next visit, will likely go to 40mg .

## 2011-02-25 LAB — COMPREHENSIVE METABOLIC PANEL
ALT: 15 U/L (ref 0–35)
AST: 19 U/L (ref 0–37)
Albumin: 3.8 g/dL (ref 3.5–5.2)
CO2: 29 mEq/L (ref 19–32)
Calcium: 9.7 mg/dL (ref 8.4–10.5)
Chloride: 99 mEq/L (ref 96–112)
Creatinine, Ser: 0.64 mg/dL (ref 0.4–1.2)
GFR calc Af Amer: >60 mL/min (ref >60)
GFR calc non Af Amer: >60 mL/min (ref >60)
Sodium: 136 mEq/L (ref 135–145)
Total Bilirubin: 0.6 mg/dL (ref 0.3–1.2)

## 2011-02-25 LAB — CBC
Platelets: 192 10*3/uL (ref 150–400)
RBC: 4.92 MIL/uL (ref 3.87–5.11)
WBC: 9.1 10*3/uL (ref 4.0–10.5)

## 2011-02-25 LAB — URINALYSIS, ROUTINE W REFLEX MICROSCOPIC
Bilirubin Urine: NEGATIVE
Glucose, UA: NEGATIVE mg/dL
Hgb urine dipstick: NEGATIVE
Specific Gravity, Urine: 1.021 (ref 1.005–1.030)
pH: 7 (ref 5.0–8.0)

## 2011-02-25 LAB — DIFFERENTIAL
Basophils Absolute: 0 10*3/uL (ref 0.0–0.1)
Basophils Relative: 0 % (ref 0–1)
Eosinophils Absolute: 0.1 10*3/uL (ref 0.0–0.7)
Eosinophils Relative: 1 % (ref 0–5)
Monocytes Absolute: 0.3 10*3/uL (ref 0.1–1.0)
Neutro Abs: 7.6 10*3/uL (ref 1.7–7.7)

## 2011-02-25 LAB — URINE MICROSCOPIC-ADD ON

## 2011-02-25 LAB — GLUCOSE, CAPILLARY: Glucose-Capillary: 161 mg/dL — ABNORMAL HIGH (ref 70–99)

## 2011-02-26 ENCOUNTER — Telehealth: Payer: Self-pay | Admitting: Family Medicine

## 2011-02-26 LAB — COMPREHENSIVE METABOLIC PANEL
AST: 17 U/L (ref 0–37)
BUN: 14 mg/dL (ref 6–23)
CO2: 31 mEq/L (ref 19–32)
Calcium: 9.8 mg/dL (ref 8.4–10.5)
Chloride: 106 mEq/L (ref 96–112)
Creatinine, Ser: 0.72 mg/dL (ref 0.4–1.2)
GFR calc Af Amer: >60 mL/min (ref >60)
GFR calc non Af Amer: >60 mL/min (ref >60)
Total Bilirubin: 0.6 mg/dL (ref 0.3–1.2)

## 2011-02-26 LAB — CBC
HCT: 40 % (ref 36.0–46.0)
MCHC: 34.6 g/dL (ref 30.0–36.0)
MCV: 80.1 fL (ref 78.0–100.0)
Platelets: 191 10*3/uL (ref 150–400)

## 2011-02-26 LAB — URINALYSIS, ROUTINE W REFLEX MICROSCOPIC
Glucose, UA: NEGATIVE mg/dL
Hgb urine dipstick: NEGATIVE
Protein, ur: NEGATIVE mg/dL
Urobilinogen, UA: 0.2 mg/dL (ref 0.0–1.0)

## 2011-02-26 LAB — LIPASE, BLOOD: Lipase: 27 U/L (ref 11–59)

## 2011-02-26 LAB — DIFFERENTIAL
Basophils Absolute: 0 10*3/uL (ref 0.0–0.1)
Eosinophils Relative: 1 % (ref 0–5)
Lymphocytes Relative: 14 % (ref 12–46)
Lymphs Abs: 1.3 10*3/uL (ref 0.7–4.0)
Neutro Abs: 7.7 10*3/uL (ref 1.7–7.7)

## 2011-02-26 NOTE — Telephone Encounter (Signed)
Spoke with patient.

## 2011-02-26 NOTE — Telephone Encounter (Signed)
Dr. Isidoro Donning office phone number is [phone]. Appointment is 5/29 @ 10:10am. Sports medicine. [address]

## 2011-02-26 NOTE — Telephone Encounter (Signed)
Pt was referred to another MD, needs # to where we referred her to.

## 2011-03-09 ENCOUNTER — Other Ambulatory Visit: Payer: Self-pay | Admitting: Sports Medicine

## 2011-03-09 DIAGNOSIS — M1990 Unspecified osteoarthritis, unspecified site: Secondary | ICD-10-CM

## 2011-03-14 LAB — COMPREHENSIVE METABOLIC PANEL
ALT: 16 U/L (ref 0–35)
AST: 22 U/L (ref 0–37)
Alkaline Phosphatase: 53 U/L (ref 39–117)
CO2: 28 mEq/L (ref 19–32)
Calcium: 10 mg/dL (ref 8.4–10.5)
Chloride: 104 mEq/L (ref 96–112)
GFR calc Af Amer: >60 mL/min (ref >60)
GFR calc non Af Amer: >60 mL/min (ref >60)
Glucose, Bld: 201 mg/dL — ABNORMAL HIGH (ref 70–99)
Potassium: 3.4 mEq/L — ABNORMAL LOW (ref 3.5–5.1)
Sodium: 139 mEq/L (ref 135–145)
Total Bilirubin: 0.5 mg/dL (ref 0.3–1.2)

## 2011-03-14 LAB — DIFFERENTIAL
Basophils Relative: 0 % (ref 0–1)
Eosinophils Absolute: 0.1 10*3/uL (ref 0.0–0.7)
Eosinophils Relative: 1 % (ref 0–5)
Lymphs Abs: 1.6 10*3/uL (ref 0.7–4.0)
Neutrophils Relative %: 82 % — ABNORMAL HIGH (ref 43–77)

## 2011-03-14 LAB — URINALYSIS, ROUTINE W REFLEX MICROSCOPIC
Ketones, ur: NEGATIVE mg/dL
Nitrite: NEGATIVE
Protein, ur: NEGATIVE mg/dL
pH: 5.5 (ref 5.0–8.0)

## 2011-03-14 LAB — CBC
Hemoglobin: 14 g/dL (ref 12.0–15.0)
MCHC: 34.1 g/dL (ref 30.0–36.0)
RBC: 5.11 MIL/uL (ref 3.87–5.11)
WBC: 11.2 10*3/uL — ABNORMAL HIGH (ref 4.0–10.5)

## 2011-03-14 LAB — GLUCOSE, CAPILLARY: Glucose-Capillary: 138 mg/dL — ABNORMAL HIGH (ref 70–99)

## 2011-03-14 LAB — LIPASE, BLOOD: Lipase: 34 U/L (ref 11–59)

## 2011-03-17 LAB — URINALYSIS, ROUTINE W REFLEX MICROSCOPIC
Glucose, UA: NEGATIVE mg/dL
Protein, ur: NEGATIVE mg/dL
Specific Gravity, Urine: 1.025 (ref 1.005–1.030)
pH: 6.5 (ref 5.0–8.0)

## 2011-03-17 LAB — COMPREHENSIVE METABOLIC PANEL
Albumin: 3.9 g/dL (ref 3.5–5.2)
BUN: 20 mg/dL (ref 6–23)
Calcium: 9.9 mg/dL (ref 8.4–10.5)
Glucose, Bld: 191 mg/dL — ABNORMAL HIGH (ref 70–99)
Sodium: 137 mEq/L (ref 135–145)
Total Protein: 7.6 g/dL (ref 6.0–8.3)

## 2011-03-17 LAB — CBC
HCT: 42.9 % (ref 36.0–46.0)
Hemoglobin: 14.5 g/dL (ref 12.0–15.0)
MCHC: 33.8 g/dL (ref 30.0–36.0)
Platelets: 202 10*3/uL (ref 150–400)
RDW: 13.4 % (ref 11.5–15.5)

## 2011-03-17 LAB — DIFFERENTIAL
Lymphocytes Relative: 22 % (ref 12–46)
Lymphs Abs: 2.3 10*3/uL (ref 0.7–4.0)
Monocytes Relative: 4 % (ref 3–12)
Neutro Abs: 7.4 10*3/uL (ref 1.7–7.7)
Neutrophils Relative %: 73 % (ref 43–77)

## 2011-03-19 LAB — COMPREHENSIVE METABOLIC PANEL
AST: 17 U/L (ref 0–37)
Albumin: 3.7 g/dL (ref 3.5–5.2)
Chloride: 103 mEq/L (ref 96–112)
Creatinine, Ser: 0.74 mg/dL (ref 0.4–1.2)
GFR calc Af Amer: >60 mL/min (ref >60)
Total Bilirubin: 0.5 mg/dL (ref 0.3–1.2)

## 2011-03-19 LAB — URINALYSIS, ROUTINE W REFLEX MICROSCOPIC
Hgb urine dipstick: NEGATIVE
Ketones, ur: NEGATIVE mg/dL
Nitrite: NEGATIVE
Protein, ur: NEGATIVE mg/dL
Specific Gravity, Urine: 1.028 (ref 1.005–1.030)
Urobilinogen, UA: 1 mg/dL (ref 0.0–1.0)
Urobilinogen, UA: 1 mg/dL (ref 0.0–1.0)
pH: 5.5 (ref 5.0–8.0)
pH: 5.5 (ref 5.0–8.0)

## 2011-03-19 LAB — DIFFERENTIAL
Basophils Absolute: 0 10*3/uL (ref 0.0–0.1)
Eosinophils Relative: 2 % (ref 0–5)
Lymphocytes Relative: 26 % (ref 12–46)
Lymphs Abs: 2.1 10*3/uL (ref 0.7–4.0)
Monocytes Absolute: 0.4 10*3/uL (ref 0.1–1.0)

## 2011-03-19 LAB — URINE MICROSCOPIC-ADD ON

## 2011-03-19 LAB — CBC
MCV: 79.8 fL (ref 78.0–100.0)
Platelets: 206 10*3/uL (ref 150–400)
WBC: 8.3 10*3/uL (ref 4.0–10.5)

## 2011-03-19 LAB — GLUCOSE, CAPILLARY: Glucose-Capillary: 159 mg/dL — ABNORMAL HIGH (ref 70–99)

## 2011-03-19 LAB — POCT I-STAT, CHEM 8
Creatinine, Ser: 0.9 mg/dL (ref 0.4–1.2)
Glucose, Bld: 135 mg/dL — ABNORMAL HIGH (ref 70–99)
Hemoglobin: 14.6 g/dL (ref 12.0–15.0)
Sodium: 142 mEq/L (ref 135–145)
TCO2: 28 mmol/L (ref 0–100)

## 2011-04-08 ENCOUNTER — Other Ambulatory Visit: Payer: Self-pay | Admitting: Family Medicine

## 2011-04-08 DIAGNOSIS — Z1231 Encounter for screening mammogram for malignant neoplasm of breast: Secondary | ICD-10-CM

## 2011-04-23 NOTE — Discharge Summary (Signed)
NAMEREYONNA, HAACK NO.:  [id number]   MEDICAL RECORD NO.:  [id number]          PATIENT TYPE:  OBV   LOCATION:  5120                         FACILITY:  [facility]   PHYSICIAN:  Leighton Roach McDiarmid, M.D.DATE OF BIRTH:  14-Apr-1951   DATE OF ADMISSION:  02/01/2008  DATE OF DISCHARGE:  02/02/2008                               DISCHARGE SUMMARY   PRIMARY CARE PHYSICIAN:  Dr. Norton Blizzard at [facility]   DISCHARGE DIAGNOSIS:  1. Hypoglycemia episode.  2. Diabetes type 2.  3. Hypertension.  4. Coronary artery disease.   DISCHARGE MEDICATIONS:  1. Metformin 1000 mg p.o. b.i.d.  2. Lisinopril/hydrochlorothiazide 20/25 mg p.o. daily.  3. Aspirin 81 mg p.o. daily.  4. Norvasc 5 mg p.o. daily.   CONSULTS:  None.   PROCEDURES:  None.   LABS:  On day of admission and day of discharge, the patient's white  blood cell count was 9.1, hemoglobin 11.  Platelets 192.  Sodium 138,  potassium 3.1,  creatinine 0.91.  Urinalysis was negative except for  small leukocytes and discharge glucose level was 132.   BRIEF HOSPITAL COURSE:  This is a 60 year old [name]  that was admitted for hypoglycemic event.   PROBLEMS:  1. Diabetes type 2.  When the patient arrived, it was told that she      was admitted for low blood sugar of 26 per EMS.  The EMS stated      that they gave her an amp of D-50 and it rose to 67.  When she      arrived in the ED.  Glucose was 122.  On 12 hours later the patient      had a glucose of 137.  The patient was also found to be      hyperkalemic on exam with a potassium of 3.1.  She was given fluids      and potassium equivalent supplementation in her fluids.  Potassium      ended up being 3.3.  While she stayed in the hospital her metformin      and Glipizide were held.  There was a count of her pills in her      bottle.  She had an appropriate number of pills.  It was suspected      that her hypoglycemic even was likely  secondary to medication      overdose.  It was noted in the previous [facility] chart that she      has been noncompliant with medications in the past and it could be      reasonable that she took more medication that needed this time      after not taking the medications on a regular basis.  It is      something that would be left to Dr. Andree Coss to elicit, but the      patient denies any noncompliance.  Plan for her is to continue her      on the metformin upon discharge, but hold the Glipizide until she      is seen  by Dr. Andree Coss.  2. Hypertension.  It was stable throughout hospital course.   DISPOSITION:  The patient is going to be discharged in stable condition.   DISCHARGE INSTRUCTIONS:  She needs to be on carb modified diet and no  restrictions to her activity and please hold the Glipizide until seen by  Dr. Andree Coss.   FOLLOWUP:  Follow up appointments with Dr. Andree Coss in [facility] on February 09, 2008 at 3 p.m.  The patient is discharged in stable  condition.      Marisue Ivan, MD  Electronically Signed      Leighton Roach McDiarmid, M.D.  Electronically Signed    KL/MEDQ  D:  02/02/2008  T:  02/02/2008  Job:  [id number]   cc:   Norton Blizzard, M.D.

## 2011-04-23 NOTE — H&P (Signed)
Meredith [name], RENDER NO.:  [id number]   MEDICAL RECORD NO.:  [id number]          PATIENT TYPE:  INP   LOCATION:  5120                         FACILITY:  [facility]   PHYSICIAN:  Pearlean Brownie, M.D.DATE OF BIRTH:  14-May-1951   DATE OF ADMISSION:  02/01/2008  DATE OF DISCHARGE:                              HISTORY & PHYSICAL   PRIMARY CARE PHYSICIAN:  Norton Blizzard, MD, [facility]   CHIEF COMPLAINT:  Low blood sugar.   HISTORY OF PRESENT ILLNESS:  Meredith [name] is a 60 year old female with  type 2 diabetes who presents to the emergency department with a  complaint of low blood sugar.  She states her medicines were recently  changed, and she has not been the same since.  Her glipizide was  changed to the extended-release formula at the end of January, 2009.  She states that for the last week, she has had several low sugars at  different times during the day to the 30s and 40s.  Today after she got  home from work, she was not feeling well.  She checked her sugar, and it  was low, so she called EMS, ate some icing and drank 2 glasses of orange  juice.  When EMS arrived, her CBG was 26.  They gave her an amp of D50,  and it rose to 67.  She states she has been getting shaky, sweaty with  blurry vision, difficulty speaking with her hypoglycemic events.   REVIEW OF SYSTEMS:  She has had a normal appetite recently, no decrease  in p.o. intake, no recent illness, positive for chronic knee pain.   FAMILY HISTORY:  Noncontributory.   SOCIAL HISTORY:  She works at [employer].  She lives alone.  She is  accompanied by her son.  No tobacco, alcohol or drugs.   PAST MEDICAL HISTORY:  1. Diabetes.  2. Hypertension.  3. Coronary artery disease.   MEDICATIONS:  1. Metformin 1000 mg b.i.d.  2. Glipizide XL 10 mg daily.  3. Lisinopril/HCTZ 20/25 daily.  4. Aspirin 81 mg daily.  5. Norvasc 5 mg daily.  6. Arthrotec 50/200 b.i.d.   PHYSICAL  EXAMINATION:  VITAL SIGNS:  Temperature 98.4, pulse 70,  respiratory rate 18, blood pressure 153/86, saturating 98% on room air.  GENERAL:  She is awake, alert, in no acute distress.  HEENT:  Pupils are equal, round and reactive to light.  Extraocular  movements are intact.  Normocephalic, atraumatic.  Mucous membranes are  moist, oropharynx clear.  NECK:  Showed full range of motion and no lymphadenopathy.  CV:  Regular rate and rhythm without murmur, rub, or gallop.  LUNGS:  Clear to auscultation bilaterally.  ABDOMEN:  Soft, nontender, nondistended, positive bowel sounds.  EXTREMITIES:  She was tender to palpation over her left knee, and there  was no asymmetry, no edema, 2+ radial and DP pulses.   LABORATORIES:  Showed white count of 9.1, hemoglobin 11, platelets 192.  She was hypokalemic with a potassium of 3.1, creatinine 0.9, glucose  122.  A UA was negative except for small  LE.  She did have a hemoglobin  A1c checked in the office on January 01, 2008, and it was 10.5%.   ASSESSMENT/PLAN:  This is a 60 year old female with type 2 diabetes  poorly controlled here with hypoglycemia.  1. Hypoglycemia:  We will hold her metformin and her glipizide.  We      will give diabetic diet.  We will check CBGs every 2 hours x3 and      then every 4 hours.  The patient does not like the extended-release      formula of the glipizide.  We recommend changing back to the      standard glipizide at discharge if she is continued on glipizide at      all.  She states she takes it in the morning.  She has an      appropriate number of pills left in the bottle.  However, we are      unsure if she has been taking both the regular and extended-release      formulas.  We are not sure why her glucose has dropped so low other      than she is taking too many medications.  2. Hypertension:  Per office note, she has been on clonidine in the      past, but she is no longer taking it.  We will continue her       Norvasc, lisinopril and HCTZ.   DISPOSITION:  We will monitor her CBGs through the day off the  medicines.  We will make sure that she can maintain her blood glucose  with her p.o. intake, and patient will likely go home later this  afternoon.      Ardeen Garland, MD  Electronically Signed      Pearlean Brownie, M.D.  Electronically Signed    LM/MEDQ  D:  02/02/2008  T:  02/02/2008  Job:  [id number]

## 2011-04-26 NOTE — Discharge Summary (Signed)
[place]. [facility]  Patient:    Meredith [name], Meredith [name]                      MRN: [id number] Adm. Date:  [id number] Disc. Date: [id number] Attending:  McDiarmid, Leighton Roach. Dictator:   Creed Copper. Cornelius Moras CC:         Huey Bienenstock McDiarmid, M.D.                           Discharge Summary  DISCHARGE DIAGNOSES:  1. Bleeding gastric ulcer.  2. Chest pain.  3. Hypercholesterolemia.  4. Diabetes type 2.  5. Obesity.  6. Iron deficiency anemia.  7. Hypertension.  8. Paroxysmal supraventricular tachycardia.  9. Atrial fibrillation. 10. Chronic osteoarthritis. 11. Rotator cuff strain.  DISCHARGE MEDICATIONS:  1. Baby aspirin one p.o. q.d.  2. Prevacid 30 mg b.i.d.  3. Albuterol two puffs p.r.n. shortness of breath.  4. Colace 100 mg b.i.d.  5. Iron 325 mg t.i.d.  6. Glucophage 500 mg q.d.  7. Lotensin 10 mg q.d.  8. Tiazac 120 mg q.d.  9. Zocor 20 mg q.d. 10. Anusol suppository b.i.d. p.r.n.  SPECIAL INSTRUCTIONS:  The patient is instructed to stop taking her Coumadin and Pepcid.  FOLLOWUP:  Follow up with Dr. Lorenz Coaster on Monday, June 4, at 2:25 p.m.  Follow up with [facility] for Persantine Cardiolite on May 30, at 1:15 p.m.  HISTORY OF PRESENT ILLNESS:  The patient is a 60 year old African-American female recently discharged with a new diagnosis of atrial fibrillation with a prescription for Coumadin.  PT was checked and was high at the outpatient clinic visit the week prior to admission and the dose was decreased.  On Friday, the family noted bruising and called the doctor on call who instructed to take Coumadin on Saturday or Sunday.  On Saturday in the evening, the patient noted tarry stool and at the end of the evening noted bright red blood per rectum.  The patient also complains of tingling in chest and on and off with radiation of tingling to bilateral arms with issues that she has occasional nausea, diaphoresis and shortness of  breath.  The patient states that she feels her chest tingling with heartburn.  REVIEW OF SYSTEMS:  Positive for mild nausea and shortness of breath. Positive tingling in her chest.  Occasional shortness of breath.  Positive melena, bright red blood per rectum, over use of Milk of Magnesia and heartburn.  Skin is positive for bruising in the left deltoid area and her right pannus.  Blood sugars have been running around 160.  Positive for use of Coumadin.  PAST MEDICAL HISTORY:  See above.  The patient has been diagnosed with PICA.  SOCIAL HISTORY:  The patient works at the [employer].  She has moderate exercise.  Denies alcohol, tobacco or drug use.  She lives alone and has four grown children.  FAMILY HISTORY:  She has a father with lung cancer.  Sister died of cancer of the stomach.  Mother had diabetes mellitus and ESRD.  ALLERGIES:  SKELAXIN.  ACE INHIBITOR gives her a cough.  PHYSICAL EXAMINATION:  VITAL SIGNS:  Temperature afebrile, blood pressure 113/60, heart rate 72, respiratory rate 20, saturations 96% on room air.  Physical exam was completely within normal limits except for a 10 cm irregular bruise on the left arm.  Rectal positive per ER MD and there were no  masses noted.  LABORATORY DATA AND X-RAY FINDINGS:  WBC 8.0, hemoglobin 10.0, hematocrit 31.6, platelets 270.  PT 30.1, INR 5.0, PTT 58.  Sodium 136, potassium 3.9, chloride 105, bicarb 28, BUN 12, creatinine 0.6, glucose 169, calcium 9.1. KUB showed no acute disease, question early ileus.  Chest x-ray showed no apparent disease and no cardiomegaly.  EKG showed no acute changes.  HOSPITAL COURSE:  #1 - GI BLEED:  The patient was admitted with serial CBCs which remained stable throughout hospitalization.  She was examined by Dr. Juanda Chance from GI who performed an EGD.  This showed a normal esophagus with multiple erosions in the stomach and some erosions in the duodenum.  CLOtest was pending at discharge.  Dr.  Juanda Chance also performed a colonoscopy on this 60 year old female and found first-degree hemorrhoids, anal fissure and minimal diverticulitis with no masses or polyps.  The patient was prescribed Prevacid and Anusol for hemorrhoids.  She is to follow up with her primary MD for further evaluation of this.  #2 - CARDIAC:  The patient had this complaint of tingling which is probable attributable to her GI issues.  Although, in this patient with multiple other medical problems, risk strategy is probably indicated.  Dr. Elsie Lincoln saw the patient and agreed to schedule a Cardiolite as an outpatient.  The patient is discharged with instructions for this.  #3 - DIABETES MELLITUS:  The patient has poor control currently although the Glucophage is a new edition to her medication list.  This should be titrated up as an outpatient as indicated.  #4 - HYPERTENSION:  The patient was stable throughout hospitalization.  #5 - ANEMIA:  This was felt to be secondary to GI bleed.  CBC remained stable throughout hospitalization.  She was discharged with iron. DD:  05/20/00 TD:  05/22/00 Job: [id number]

## 2011-04-26 NOTE — H&P (Signed)
[place]. [facility]  Patient:    Meredith [name], Meredith [name]                        MRN: [id number] Adm. Date:  04/06/00 Dictator:   Wilfrid Lund, M.D.                         History and Physical  PRIMARY CARE PHYSICIAN: Matthew Folks, M.D. [facility]  PROBLEM LIST:  1. Atrial fibrillation.  2. Episode of chest tightness with associated shortness of breath,     diaphoresis, and nausea; rule out myocardial infarction.  3. Hypercholesterolemia.  4. Obesity.  5. Hypertension.  HISTORY OF PRESENT ILLNESS: This 60 year old black female had an episode of dizziness earlier on April 05, 2000 which resolved on its own prior to going to bed.  The patient then awoke at 10 p.m. when chest palpitations, short of breath, and "jittery", feeling that she would pass out.  She had a burning chest pain located in the substernal and epigastric region.  She felt nauseous but did not vomit.  She was diaphoretic with this episode.  The patient was brought by EMS to [facility] Emergency [facility], where she was found to be in atrial fibrillation with rapid ventricular response.  REVIEW OF SYSTEMS: The patient has been craving ice.  She complains of nausea and chronic constipation, and depression.  PAST MEDICAL HISTORY: Please see problem list.  MEDICATIONS:  1. Adalat 60 mg q.d.  2. Aspirin 325 mg q.d.  ALLERGIES:  1. SKELAXIN.  2. ACE INHIBITOR.  SOCIAL HISTORY: Denies tobacco, alcohol, or drug use.  Lives alone.  Has four grown children.  FAMILY HISTORY: Father had colon cancer.  Sister had stomach cancer and diabetes.  Her mother had end-stage renal disease secondary to diabetes.  PHYSICAL EXAMINATION:  VITAL SIGNS: Temperature 97.3 degrees, blood pressure ranges from 80-110/40-60, pulse 100-140, respiratory rate 20.  Oxygen saturation 96%.  GENERAL: In general she is in no apparent distress and no respiratory distress.  She is alert and  oriented x 3.  HEENT: Head normocephalic, atraumatic.  TMs within normal limits.  EOMI. PERRL.  Oropharynx moist.  Poor dentition.  No erythema or exudate.  NECK: Supple.  No lymphadenopathy, no JVD.  LUNGS: Clear to auscultation bilaterally.  HEART: Tachycardic with irregularly irregular rhythm.  No murmur appreciated.  ABDOMEN: Obese, nontender, nondistended.  No hepatosplenomegaly appreciated.  GU: Deferred.  RECTAL: Normal tone, guaiac negative, control appropriate.  EXTREMITIES: No edema, good distal pulses.  LABORATORY DATA: Chest x-ray shows no acute disease process.  EKG shows atrial fibrillation with RVR, with a possible subendocardial injury.  An i-STAT shows a sodium of 136, potassium 4.0, chloride 101, BUN 12, creatinine 0.8, glucose 368.  CMP, CBC, and troponin I pending.  ASSESSMENT/PLAN:  1. Atrial fibrillation.  The patient has a history of paroxysmal atrial     fibrillation and with slightly lower blood pressures concerned about     cardiovascular stability.  Will attempt to use a very low dose of     intravenous diltiazem to control rate.  My feeling is with a more     controlled rate the patient will have a more efficient cardiac output     and her blood pressure should paradoxically respond.  Will admit to     telemetry and consider cardiac consultation in the morning.  Will start     anticoagulation  process with heparin and Coumadin per pharmacy protocol.  2. Episode of chest pain.  Will rule out myocardial infarction with CK-MBs     x 3 every eight hours and recheck an electrocardiogram in the morning.     The patient is already anticoagulated and given aspirin.  Will start     nasal cannula oxygen.  Currently beta-blocker will not be used as she     is already hypotensive and we are starting a calcium channel blocker     to attempt rate control.  Should the calcium channel blocker be     ineffective consider use of beta-blocker for rate control.  3.  Hyperglycemia.  The patient is classic type for diabetes type 2 but     this has not been diagnosed in her in the past.  Will carefully monitor     her blood sugars and consider work-up for diabetes as an outpatient.DD: 04/06/00 TD:  04/06/00 Job: [id number]

## 2011-04-26 NOTE — H&P (Signed)
[name]. [facility]  Patient:    Meredith [name], Meredith [name]                      MRN: [id number] Adm. Date:  [id number] Attending:  McDiarmid, Leighton Roach. Dictator:   Creed Copper. Cornelius Moras                         History and Physical  CHIEF COMPLAINT:  GI bleed.  HISTORY OF PRESENT ILLNESS:  The patient was recently discharged with a prescription for Coumadin.  PT was checked and dose was decreased last week. On Friday, family noticed bruising and called M.D. on call.  They were told to hold Coumadin on Saturday and Sunday and return to the clinic on Monday morning.  On Saturday p.m. they noted a tarry stool and this p.m. noted bright red blood per rectum.  The patient also complains of tingling in her chest on and off, with radiation of tingling to her arms.  With this she has occasional nausea, diaphoresis, and shortness of breath, although she also states she has these symptoms occasionally without the chest tingling.  REVIEW OF SYSTEMS:  Mild nausea and shortness of breath; tingling in chest; melena; bright red blood; over use of milk of magnesia for constipation and heartburn.  Skin:  The patient complains of bruising on her left arm and her right side.  The patient states her blood sugars run around 160s.  PAST MEDICAL HISTORY: Significant for diabetes mellitus, hypercholesterolemia, obesity, iron-deficiency anemia, hypertension.  Paroxysmal supraventricular tachycardia, atrial fibrillation, uterine fibroids.  MEDICATIONS:  1. Albuterol 2 puffs p.r.n.  2. Aspirin EC 325 mg p.o. q.d.  3. Colace 100 mg with breakfast and dinner.  4. Coumadin currently holding this medication.  5. Iron 325 mg t.i.d.  6. Glucophage 500 mg q.d.  7. Lotensin 10 mg in a.m.  8. Pepcid complete b.i.d.  9. Tiazac 100 mg in a.m. 10. Zocor 20 mg q.h.s.  FAMILY HISTORY:  Significant for lung cancer in her father, stomach cancer in a sister.  Diabetes and ESRD in mother.  SOCIAL HISTORY:   She works at [employer].  Get moderate exercise.  Denies alcohol, tobacco, or drugs.  Lives alone.  Has four grandchildren.  ALLERGIES:  SKELAXIN and ACE INHIBITORS.  PAST SURGICAL HISTORY:  Significant for BTL.  PHYSICAL EXAMINATION:  VITAL SIGNS:  Temperature 98.5, blood pressure 113/60, heart rate 72, respirations 20.  Saturating 98% on room air.  GENERAL:  The patient is alert and oriented in no apparent distress.  HEENT:  Eyes:  Conjunctivae are without injection.  Nose:  No scleral icterus. Pupils are equally round and reactive.  Extraocular motions are intact.  ENT exam not done.  NECK:  Supple.  No lymphadenopathy.  No JVD.  LUNGS:  Respirations clear to auscultation bilaterally.  The patient does have a mildly productive cough which is leading to a little bit of gagging.  Cough is productive of thick yellowish mucous.  CARDIOVASCULAR:  Regular rate and rhythm.  No murmurs, gallops, or rubs.  No pedal edema.  GASTROINTESTINAL:  Positive bowel sounds.  Nontender, nondistended.  No masses.  No organomegaly apparent although exam was limited secondary to obesity.  SKIN:  There is a 10 cm irregularly shaped bruise on the arm over the deltoid anteriorly on the left.  There is a mild bruise on the pannus on the right side.  NEUROLOGIC:  Cranial nerves are  grossly intact.  RECTAL:  Heme positive per ER M.D.  Also report no masses.  LABORATORY:  WBC 8, hemoglobin 10, hematocrit 30.6, platelets 270.  Sodium 136, potassium 3.9, chloride 105, bicarb 28, BUN 12, creatinine 0.6, glucose 169, calcium 9.1.  PT 30, INR 5.0, PTT 58.  KUB no apparent disease questionable.  A few air-gas levels.  No significant sign of SBO.  Chest x-ray:  No apparent disease.  No cardiomegaly.  A 12-lead EKG:  Normal sinus rhythm.  No ST segment elevations or depressions.  No T wave inversion. Normal EKG.  ASSESSMENT AND PLAN:  1. Gastrointestinal bleed.  The patient is over coagulated at  this time.  We     will hold Coumadin.  Will check chart for indication and reeducate family     about this as there is some concern whether this was needed.  Once acute     bleed is over, the patient may benefit from colonoscopy,     esophagogastroduodenoscopy given gastrointestinal bleed and questionable     history of gastroesophageal reflux disease.  We will check serial CBCs and     will give Pepcid IV.  2. Cardiac:  Given complaint of tingling and other signs and symptoms, we     will admit to telemetry bed and rule out with serial electrocardiograms     and enzymes.  3. Diabetes mellitus.  Poor control as outpatient.  We will check chart.  It     may be time to recheck hemoglobin A1C and increase the Glucophage dose at     discharge.  4. Hypertension is stable on her current medications.  5. Anemia.  We will check old chart for baseline and follow serial CBCs.     Will restart all her medications once the patient is no longer NPO. DD:  04/28/00 TD:  04/28/00 Job: [id number]

## 2011-04-26 NOTE — Discharge Summary (Signed)
[place]. [facility]  Patient:    Meredith [name], Meredith [name]                      MRN: [id number] Adm. Date:  [id number] Attending:  Tobin Chad CC:         Marinus Maw, M.D.   Discharge Summary  ADDENDUM:  DATE OF BIRTH:  1951/10/30.  LABORATORY DATA:  Labs pending at discharge are fasting lipid panel.  Please send the results of this lipid panel to Dr. Janit Pagan office along with dictated discharge summary. DD:  09/22/00 TD:  09/22/00 Job: [id number]

## 2011-04-26 NOTE — Discharge Summary (Signed)
[place]. [facility]  Patient:    Meredith [name], Meredith [name]                      MRN: [id number] Adm. Date:  [id number] Disc. Date: 04/09/00 Attending:  Doug Sou Dictator:   Raynelle Jan, M.D. CC:         Matthew Folks, M.D.             Estill Batten. Deirdre Priest, M.D.             Raynelle Jan, M.D.                           Discharge Summary  DISCHARGE DIAGNOSES: 1. Paroxysmal atrial fibrillation. 2. Diabetes mellitus type 2. 3. Hypercholesterolemia. 4. Hypertension. 5. Anemia. 6. Osteoarthritis.  DISCHARGE MEDICATIONS: 1. Coumadin 7.5 mg p.o. q.d. 2. Glucophage 1000 mg in the a.m. and 500 mg p.o. in the p.m. 3. Zocor 20 mg p.o. q.d. 4. Cardizem 120 mg p.o. q.d. 5. Ferrous sulfate 325 mg p.o. t.i.d. 6. Enteric coated aspirin 325 mg p.o. q.d. 7. Colace 100 mg p.o. b.i.d. 8. Lotensin 10 mg p.o. q.d.  DIET:  She is to remain on a diabetic, low fat diet as instructed.  SPECIAL INSTRUCTIONS:  Check blood sugars before breakfast and supper and record the readings for her appointment to see Dr. Lorenz Coaster in the family practice center.  FOLLOWUP:  She is to follow up with Dr. Lorenz Coaster on Wednesday, Apr 16, 2000, at 8:40 a.m.  She is to follow up with her diabetes education counselor and classes, which were scheduled prior to discharge.  The patients primary physician will need to follow up on the results of her echocardiogram as well as her hemoglobin electrophoresis results.  HISTORY OF PRESENT ILLNESS:  This is a 60 year old lady who presents to the ED with palpitations, shortness of breath and a presyncopal episode that occurred at approximately 10 p.m. on the night of admission.  This was associated with some chest pain that was located in the substernal and epigastric region as well as nausea, but no vomiting.  She activated the EMS and was brought to the ED where she was found to be in atrial fibrillation with a rapid ventricular response.  REVIEW  OF SYSTEMS:  Significant for a ice PICA, constipation, nausea, depression and blurred vision.  Otherwise, her review of systems was normal.  PAST MEDICAL HISTORY:  Significant for hypercholesterolemia, hypertension, anemia, obesity.  She had a history of atrial fibrillation and osteoarthritis. She had stopped most of her medications and was only taking aspirin and Adalat.  Please see the rest of the dictated H&P for further history.  PHYSICAL EXAMINATION:  VITAL SIGNS:  Afebrile with a blood pressure of 80s-110s systolic/40s-60s diastolic, pulse was anywhere from the 100s-140s, respiratory rate regular and saturating 96%.  HEENT:  Poor dentition.  CARDIAC:  Irregularly irregular tachycardic heart rate with no murmur.  She had no JVD or pedal edema.  She was heme negative.  LABORATORY DATA AND X-RAY FINDINGS:  Blood glucose of 368 which was significant as the patient had no prior history of diabetes.  Her chest x-ray was within normal limits.  Her EKG showed atrial fibrillation with a rapid ventricular response.  HOSPITAL COURSE:  She was admitted to [facility] for atrial fibrillation with a rapid ventricular response as well as new-onset diabetes.  #1 - ATRIAL FIBRILLATION:  She was started  on heparin as well as Coumadin with an initial plan of discharging her once her Coumadin became therapeutic.  An echocardiogram was performed, which at the time of this dictation, results are unknown as it was inadvertently not entered into the chart.  She was rate controlled on Cardizem CD and at the time of discharge, her pulse remained in the 70s-80s consistently.  During her hospitalization, she converted back into sinus rhythm.  However, on the day of discharge, it was discovered that the patient had not been receiving her Coumadin which had been ordered by the physician staff.  As she was felt to be a low risk for stroke given her echocardiogram results and her history of  paroxysmal atrial fibrillation, it was felt that the patient could be discharged home with a subtherapeutic INR with close monitoring by home health who would be calling in the results to her primary physician.  Both her primary physician and the patient was comfortable with this plan.  #2 - DIABETES:  As this was a new diagnosis, the patient received extensive diabetic education while in the hospital.  She was placed on Glucophage and this was titrated during her hospitalization and will need to be further titrated as an outpatient with the addition of other hypoglycemic agents as needed.  #3 - HYPERCHOLESTEROLEMIA:  A fasting lipid panel was obtained during her hospitalization showing her total cholesterol to be 245, triglycerides of 98, HDL 48 and LDL of 177.  She was started back on her Zocor and will need to be followed as an outpatient.  #4 - ANEMIA.  Iron studies were performed during the hospital which showed her serum iron to be 72, TIBC 310 and her ferritin low at 7.  However, the patient persistently had an MCV in the 59 range.  A hemoglobin electrophoresis was obtained to search for a possible thalassemia and this will need to be followed up on as an outpatient.  Therefore, her anemia was thought to be secondary to a combination of iron deficiency as well as uterine fibroids and thalassemia. DD:  04/18/00 TD:  04/21/00 Job: [id number]

## 2011-04-26 NOTE — Discharge Summary (Signed)
[place]. [facility]  Patient:    Meredith [name], Meredith [name]                      MRN: [id number] Adm. Date:  [id number] Disc. Date: 09/22/00 Attending:  Tobin Chad Dictator:   Maryelizabeth Rowan, M.D. CC:         Michell Heinrich, M.D., [facility]   Discharge Summary  DATE OF BIRTH:  1951-07-23  HISTORY OF PRESENT ILLNESS:  This 60 year old African-American female with past medical history of obesity with newly diagnosed diabetes mellitus, hypercholesterolemia, and GERD, presented complaining of chest pressure.  The chest pressure apparently awoke her from sleep.  The patient had eaten approximately 45 minutes prior to lying down to sleep.  The patient does state that she has this pressure on and off, and it is usually alleviated with an upright position.  The patient presented to the ER with this pressure where she received 3 nitroglycerin that did not relieve the pain; however, a GI cocktail gave her significant relief.  CK and CK-MB were slightly increased in the ER at 200 and 6.0; therefore, the patient was admitted for 23-hour observation.  EKGs obtained showed no significant change from previously x 2, and the patient has a recent history of a Cardiolite stress test May 07, 2000.  The patient also had a recent admission in May 2001 for GI bleed while on Coumadin.  During this hospitalization, Dr. Juanda Chance did an EGD as well as colonoscopy which were unremarkable.  HOSPITAL COURSE:  Course was uneventful.  The patient had no recurrence of her abdominal pain, and she had decrease in her CK and MB as well as no change in her EKGs.  The patient had no episode of chest pain while in hospital, and the patient did remain upright after meals received here.  CONSULTATIONS:  None.  PROCEDURES:  None.  DISCHARGE INSTRUCTIONS:  Followup appointment with Dr. Milinda Cave on October 02, 2000, at 2:45 p.m.  MEDICATIONS: 1. Albuterol MDI 2 puffs  p.r.n. 2. Aspirin 81 mg q.d. 3. Colace q.d. 4. Glucophage 50 mg b.i.d. 5. Lotensin 10 mg q.a.m. 6. Niferex 150 mg q.d. 7. Prevacid 30 mg b.i.d. until finished prescription, then use Protonix    40 mg q.d. as this medication is somewhat cheaper. 8. Zocor 20 mg before breakfast. 9. Tiazac 120 mg once a day.  ACTIVITY:  Instructions include to walk daily for 20 to 30 minutes.  DIET:  Diabetic diet and follow up with diabetic nutrition classes.  SPECIAL INSTRUCTIONS:  Remain upright after eating for at least one hour. DD:  09/22/00 TD:  09/22/00 Job: [id number]

## 2011-04-26 NOTE — Procedures (Signed)
[place]. [facility]  Patient:    Meredith [name], Meredith [name]                      MRN: [id number] Proc. Date: 04/29/00 Adm. Date:  [id number] Disc. Date: [id number] Attending:  McDiarmid, Leighton Roach CC:         Huey Bienenstock McDiarmid, M.D.                           Procedure Report  PROCEDURE:  Upper endoscopy and colonoscopy.  ENDOSCOPIST:  Hedwig Morton. Juanda Chance, M.D.  INDICATIONS:  This 60 year old black female diabetic was admitted with severe iron deficiency anemia, despite taking iron supplements on a daily basis for the past four weeks.  She has had dark stools as well as bright red blood per rectum. She was started on Coumadin several weeks ago, due to paroxysmal atrial fibrillation. On discharge from the hospital several weeks ago, her hemoglobin was 9.3, and it has come up only to 10.0 g.  She has been Hemoccult-positive.  She is undergoing a colonoscopy as well as an upper endoscopy for further evaluation of her GI blood loss.  She also has been having heavy periods, lasting for three days of heavy flow.  She has complained of epigastric band-like pain and is being evaluated to rule out cardiac causes.  ENDOSCOPE:  Fujinon single-channel videoscope.  SEDATION:  Versed 5 mg IV, Demerol 50 mg IV.  FINDINGS/DESCRIPTION OF PROCEDURE:  The Fujinon single-channel videoscope was passed under direct vision through the posterior pharynx into the esophagus. The patient was monitored by pulse oximeter.  Her oxygen saturations were normal. he proximal and distal esophageal mucosa was unremarkable.  There was no hiatal hernia, no esophagitis, no esophageal varices.  STOMACH:  The stomach was insufflated with air and showed a small amount of coffee-ground specks in the greater curvature.  There were multiple pinpoint erosions in the gastric antrum which were also coated with coffee-ground material. The gastric outlet was normal.  Retroflexion of the endoscope revealed a  normal  fundus and cardia.  The CLO test was taken from the gastric antrum, to rule out H. pylori.  DUODENUM:  The duodenal bulb was mildly inflamed with some pinpoint erosions in the descending duodenum, but no evidence of bleeding.  The endoscope was then brought back into the stomach.  It was noted that the pylorus was somewhat spastic, but  there was no significant obstruction.  The patient tolerated the procedure well.  IMPRESSION: 1. Antral gastritis, status post CLO test. 2. A small amount of coffee-ground material in the stomach, indicating a    low-grade gastrointestinal blood loss. 3. Mild duodenitis.  COLONOSCOPY ENDOSCOPE:  Fujinon single-channel videoscope.  SEDATION:  Additional Versed 7.5 mg IV, Demerol additional 25 mg IV.  FINDINGS/DESCRIPTION OF PROCEDURE:  The Fujinon single-channel videoscope was passed under direct vision from the rectum to the sigmoid colon.  The patient was again monitored by pulse oximeter.  Oxygen saturation fluctuated between 90%-98% on 4 L of nasal O2.  The anal canal showed a superficial anal fissure.  There were  first-grade hemorrhoids which were confirmed by retroflexing the endoscope in the rectal ampulla.  A bluish discoloration of the hemorrhoids indicated some vascular congestion.  The fissure had a bright red discoloration but did not show any active bleeding.  There were a few scattered diverticulosis in the sigmoid colon, but essentially the mucosa was normal, and there  were no polyps throughout the colon. the colonoscope passed easily through the descending colon, the splenic flexure, the transverse colon, hepatic flexure to the ascending colon, to the cecum. The cecal pouch and ileocecal valve were visualized fully, and showed normal appendiceal opening, and a normal ileocecal valve.  The colonoscope was then retracted and the colon decompressed.  The patient tolerated the procedure well.  IMPRESSION: 1. Anal  fissure and first-grade hemorrhoids. 2. Minimal diverticulosis of the left colon.  PLAN:  The patients GI blood loss may be related to antral gastritis, as well as to the presence of an anal fissure and first-grade hemorrhoids.  Her blood loss  also could be compounded by heavy periods.  The patient has been taken off of the Coumadin and may not return on Coumadin.  We will resume her iron supplements at this time and follow her H&H.  If the iron deficiency anemia continues, despite no evidence of GI blood loss, then a gynecologic evaluation may be justified, for he consideration of a hysterectomy. DD:  04/29/00 TD:  05/03/00 Job: [id number]

## 2011-04-26 NOTE — H&P (Signed)
. [facility]  Patient:    Meredith [name], Meredith [name] Visit Number: [id number] MRN: [id number]          Service Type: MED Location: 2000 2006 01 Attending Physician:  McDiarmid, Leighton Roach. Dictated by:   Vear Clock, M.D. Admit Date:  06/16/2002   CC:         Michell Heinrich, M.D.   History and Physical  PRIMARY PHYSICIAN: Michell Heinrich, M.D.  CHIEF COMPLAINT: Chest tightness.  HISTORY OF PRESENT ILLNESS: The patient is a 60 year old [name], who complains of a substernal chest tightness that started yesterday. This discomfort has not changed in quality during the time it has been present. She denies radiation of the pain, denies nausea and vomiting, positive for diaphoresis for a short period of time yesterday and today, positive shortness of breath with exertion "got a little tired and that is unusual for me." The patient also complained of being unable to relax. The patient checked her pulse today as Dr. Milinda Cave has taught her how to do and noticed that her heart "was no beating right." She felt that she had palpitations both yesterday and today, and felt that it was probably appropriate for her to come in. Of note, she was able to work today. The patient took a baby aspirin at home and three here in the ER with subsequent relief of chest pain. At its worst, chest pain was 7/10 and is now approximately 4/10.  PAST MEDICAL HISTORY: Significant for diabetes mellitus type 2, hypercholesterolemia, obesity, iron deficiency anemia, hypertension, GERD, medical noncompliance, paroxysmal atrial fibrillation, colonic diverticulosis, a GI bleed on Coumadin.  PAST SURGICAL HISTORY: BTL. TAH/BSO in March 2003. Cardiolite in May 2001 showing no ischemia with an EF of 55%.  MEDICATIONS: 1. Aspirin 81 mg p.o. q.d. 2. Glucophage 1000 mg p.o. q.a.m., 500 mg p.o. q.p.m. 3. Lotensin 40 mg p.o. q.a.m. 4. Protonix 40 mg p.o. q.d.  which patient has not been taking. 5. Diltiazem 360 mg p.o. q.d. 6. Zocor 20 mg p.o. q.h.s. which patient has not been taking.  REVIEW OF SYSTEMS: Positive for fatigue, palpitations, chest tightness, shortness of breath with exertion, chronic constipation, and hot flashes. Negative for cough, nausea, vomiting, diarrhea, skin rashes, or lesions, numbness, weakness, tingling, depression, vision changes, dysuria, or bloody stools.  ALLERGIES: SKELAXIN which causes hives and questionable cough with ACE INHIBITOR.  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 Remote history of a 5 to 10-year tobacco use. Remote history of social alcohol. Remote history of marijuana use. No sex "in God knows when." The patient lives alone and has three grandchildren. One son was murdered in [id number] at 27 years of age.  FAMILY HISTORY: Father deceased at age 31 secondary to COPD and lung cancer. Sister deceased at age 86 secondary to stomach cancer. Mother deceased in her 32s in 55 secondary to diabetes and end-stage renal disease. No family history of early MIs or CVAs.  PHYSICAL EXAMINATION:  VITAL SIGNS: Temperature 97.5, blood pressure 147/91, pulse 74, respirations 22, sating 99% on 2 L of O2.  GENERAL: Obese, fairly distraught patient secondary to her presumed diagnosis, but in no acute distress.  HEENT: PERRLA. EOMI. Oropharynx is clear.  NECK: Supple. No lymphadenopathy.  CARDIOVASCULAR: Regular rate and rhythm. No murmur appreciated.  RESPIRATORY: Lungs clear to auscultation bilaterally with no increased work of breathing.  ABDOMEN: Morbidly obese with no obvious masses or tenderness,  or hepatosplenomegaly or splenomegaly.  NEUROLOGICAL: Cranial nerves grossly intact. Good strength, tone and range of motion. No sensory deficits.  RECTAL: Heme-negative. Positive stool in vault, but soft and brown.  LABORATORY AND TESTS: WBC  7.6, hemoglobin 13.2, platelets 200. CK 173, MB 7.4, index 4.3, troponin 0.01. Sodium 139, potassium 3.7, chloride 107, CO2 25, BUN 21, creatinine 0.7, glucose 140. I-STAT ABG: pH 7.38, pCO2 41.3, bicarb 25. The patients last cholesterol based on the clinic record was in March of this year. His total cholesterol 237, LDL 165, and HDL 54.  Chest x-ray today shows mild cardiomegaly with no acute disease. ECG shows questionable Q wave in aVL. No obvious P waves in V1 secondary to atrial fibrillation. Positive PVCs. Poor R wave progression versus 501 and no obvious acute ST abnormality.  ASSESSMENT AND PLAN: A 60 year old [name] with chest pain or chest tightness admitted for rule out myocardial infarction. 1. Cardiovascular: At minimum, the patient is a huge setup for an myocardial    infarction secondary to diabetes, obesity, age, postmenopausal state,    hypertension, and hypercholesterolemia. Although her chest pain is atypical    in that it is not relieved with rest or brought on by exertion, the    patient very likely has current and a history of ischemia. Did discuss    this patient with Dr. Deborah Chalk from cardiology and we agree that the patient    does not need catheterization at this time. He will plan to see her in the    morning for consideration of catheterization at that time, which would    likely be very difficult secondary to her body habitus of 278.1 pounds.    In the meantime we will check cardiac enzymes x3, repeat and ECG in the    morning, check fasting lipids in the morning, check a hemoglobin A1c, a    TSH and review her CBC and BMET in the morning. 2. Constipation: Will start patient on Senokot-S 2 tabs p.o. q.h.s. p.r.n.    constipation. The patient likely needs a bowel regimen at home as well. 3. Noncompliance: Hopefully, this event will be sufficient to convince patient    of the vital importance of complying with medical recommendations as she  is     very likely to have ongoing disease secondary to her comorbidities. Dictated by:   Vear Clock, M.D. Attending Physician:  McDiarmid, Tawanna Cooler D. DD:  06/17/02 TD:  06/20/02 Job: [id number]

## 2011-04-26 NOTE — Discharge Summary (Signed)
[place]  Patient:    Meredith [name], Meredith [name] Visit Number: [id number] MRN: [id number]          Service Type: MED Location: 2000 2006 01 Attending Physician:  McDiarmid, Leighton Roach. Dictated by:   Rodolph Bong, M.D. Admit Date:  06/16/2002 Discharge Date: 06/19/2002   CC:         Michell Heinrich, M.D., Family Practice   Discharge Summary  DISCHARGE DIAGNOSES: 1. Chest pain. 2. Diabetes mellitus. 3. Hyperlipidemia. 4. Hypertension. 5. Obesity. 6. Anemia. 7. Paroxysmal atrial fibrillation. 8. Gastroesophageal reflux disease. 9. Questionable gastrointestinal bleed on Coumadin.  DISCHARGE MEDICATIONS: 1. Aspirin 81 mg p.o. q.d. 2. Lotensin 40 mg p.o. q.d. 3. Diltiazem 60 mg p.o. q.d. 4. Zocor 40 mg p.o. q.d. 5. Nitroglycerin 0.4 mg sublingual tablets one tablet p.o. q.5 minutes    for up to a total of three.  PROCEDURES: 1. A two-day Cardiolite which was negative for induced ischemia, revealed    a small area of decreased inferior apical activity, question of scarring    versus thinning. She had an LVEF of 61%. 2. A chest x-ray with mild cardiomegaly.  CONSULTS: 1. Cardiology, Colleen Can. Deborah Chalk, M.D. 2. Financial assistance with Rudell Cobb.  HISTORY OF PRESENT ILLNESS:  This is a 60 year old [name] with multiple risk factors who presented with one-day of substernal chest tightness.  She denied any radiation but did acknowledge some diaphoresis with the episode yesterday. She denied any nausea or vomiting. She reports some palpitations which were unusual for her.  In addition, while at work. She had some shortness of breath with exertion that was atypical.  HOSPITAL COURSE:  #1 - CHEST TIGHTNESS:  The patient presented with symptoms listed in the history and physical section.  She received nitroglycerin in the emergency department which relieved her chest tightness.  In addition, the patient received aspirin,  Lotensin and Diltiazem while in the hospital. The beta-blocker was not started during her hospital admission secondary to history of asthma per patient and pulse rate in the 60s.  She had an EKG done upon admission which revealed a questionable Q-wave in the aVL.  The EKG was also significant for multiple PVCs and poor R-wave progression versus a prior EKG on May 2001.  There was no obvious acute ST abnormalities.  However, considering her multiple risk factors and subtle EKG changes, she was started on heparin while in the emergency department and remained on heparin for two days of hospitalization.  While in the hospital, she had negative cardiac enzymes x3 and no changes noted by telemetry.  She underwent a two-day Cardiolite by [employer]. Deborah Chalk, M.D., which revealed no evidence for induced ischemia and a small area of decreased inferior apical activity that was consistent with scarring or thinning. She had an LVEF of 61%.  I discussed these findings with Dr. Deborah Chalk and he indicated discharge was appropriate at this time with current medical regimen and follow-up as needed.  #2 - DIABETES MELLITUS:  The patients diabetes is controlled at home on glucophage.  However, due to the possibility of intervention, this was held upon admission. She was maintained in fairly good control on sliding scale insulin while in the hospital.  She should resume her glucophage upon discharge.  #3 - HYPERLIPIDEMIA:  Her lipid panel while in the hospital revealed total cholesterol 275, triglycerides 153, HDL 62, and LDL 182.  Therefore, she was sent out on Zocor 40 mg p.o. q.d. with a one  month sample from the [facility]. She acknowledged that inability to afford the medication had resulted in her not taking it for quite a while.  #4 - HYPERTENSION:  While in the hospital, the patients blood pressure was somewhat labile but fairly well controlled.  She is currently taking Lotensin 40 mg  q.d. and Cardizem CD 360 mg q.d.  She does report history of asthma and maintained a pulse rate in the 60s while in hospitalization.  If necessary, a change in these medications or addition of another medication can be pursued as an outpatient.  #5 - OBESITY:  The patient was maintained on a fat modified ADA diet while in the hospital.  #6 - ANEMIA:  Stable while in hospital.  #7 - PAROXYSMAL ATRIAL FIBRILLATION:  There were no episodes while in hospital.  #8 - GASTROESOPHAGEAL REFLUX DISEASE: Stable while in hospital.  Maintained on Protonix.  DISCHARGE DIET:  American Diabetic Association diet.  SPECIAL INSTRUCTIONS:  The patient was advised to follow up with Rudell Cobb from [employer] regarding assistance with obtaining her medication. [name] assisted by providing several resources for the patient to pursue as an outpatient.  She was also advised to take her nitroglycerin tablets if resumption of chest pain.  If no relief after three tablets, call your doctor or return to the emergency department.  FOLLOW-UP: 1. Michell Heinrich, M.D., [facility], on Monday,    June 05, 2002, at 1:30 p.m. 2. Cardiology, Colleen Can. Deborah Chalk, M.D., as needed. Dictated by:   Rodolph Bong, M.D. Attending Physician:  McDiarmid, Tawanna Cooler D. DD:  06/19/02 TD:  06/22/02 Job: [id number]

## 2011-04-26 NOTE — H&P (Signed)
Meredith [name], IANNELLO NO.:  [id number]   MEDICAL RECORD NO.:  [id number]          PATIENT TYPE:  EMS   LOCATION:  MAJO                         FACILITY:  [facility]   PHYSICIAN:  Cassell Clement, M.D. DATE OF BIRTH:  [birth year]   DATE OF ADMISSION:  09/12/2004  DATE OF DISCHARGE:                                HISTORY & PHYSICAL   CHIEF COMPLAINT:  Chest pain.   HISTORY:  This is a 60 year old divorced African-American female admitted  with chest pain and an abnormal electrocardiogram and slightly positive  enzymes.  No prior history of any known coronary artery disease.  Old  hospital chart is not presently available.  Apparently she was hospitalized  here several years ago for chest pain but she is not aware of the details of  that workup.  She did not have cardiac cath.  She does have a history of  exertional dyspnea felt to be secondary to her exogenous obesity.  She has  never had any prior history of angina pectoris.  This morning at work she  developed substernal chest pressure without radiation and without  diaphoresis, nausea or increased dyspnea.  She went home and then called the  nurse at the [facility] who advised that she come to be  checked at the emergency room because of her symptoms of chest pressure.  By  that time she was feeling comfortable but did come to the ER where her  electrocardiogram showed a change from that taken two years ago and now has  a QS pattern in V1 through V3.  Her troponin is normal but CK MB is elevated  at 8.4.  She is not presently having any chest discomfort.   PAST MEDICAL HISTORY:  Reveals that she has had hypertension and diabetes,  both for about 7 years.  She has exogenous obesity and she estimates her  weight at 302 pounds.   PRESENT MEDICATIONS:  1.  Clonidine 0.1 mg daily.  2.  Hydrochlorothiazide 25 mg daily.  3.  Glipizide 10 mg daily.  4.  Diltiazem 120 mg t.i.d.  5.  Metformin 1000 mg  b.i.d.   She is not presently on an ACE inhibitor or an ARB and she is not aware as  to whether she has ever been on them or not.   ALLERGIES:  SHE IS ALLERGIC TO NO MEDICINE EXCEPT FOR A MUSCLE RELAXANT OF  UNKNOWN NAME.   PREVIOUS SURGERY:  Includes:  1.  Hemorrhoidectomy.  2.  Hysterectomy.   REVIEW OF SYSTEMS:  No history of any peptic ulcer disease or GI bleeding.  Bowel movements have been normal.  She denies any dysuria.  She has a remote  history of asthma but no recent symptoms.  Remainder of review of systems is  negative in detail.   FAMILY HISTORY:  Revealed that her father died of cancer of the lung in his  19s, mother died of chronic renal failure and diabetes in her 77s.  She has  a sister who died of breast cancer.  SOCIAL HISTORY:  Reveals that she is divorced.  She has four children, three  in good health and one son was killed.  She has been a nonsmoker for 20  years.  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  She does not use alcohol  or tobacco.   PHYSICAL EXAMINATION:  Blood pressure is 126/74, pulse is 96 and regular,  respirations are normal.  GENERAL APPEARANCE:  Reveals an obese  black female in no acute distress.  PUPILS:  Are equal.  CONJUNCTIVA:  Clear.  MOUTH AND PHARYNX:  Normal.  JUGULAR VENOUS PRESSURE:  Normal.  CAROTIDS:  Normal.  LUNGS:  Clear to percussion and auscultation.  HEART:  Reveals no murmur, gallop, rub or click.  ABDOMEN:  Reveals no tenderness or organomegaly.  EXTREMITIES:  Show no phlebitis.  She has good peripheral pulses and no  edema.   Her electrocardiogram shows a QS pattern in V1-V3 that is new since June 17, 2002, and there is slight ST elevation in V1-V3.  Cannot rule out recent  anteroseptal MI.  Her chest x-ray shows clear lungs and borderline  cardiomegaly but no overt CHF.  Her troponin is less than 0.05 but her CK MB  is elevated at 8.4, sodium 139, potassium 3.6, BUN is 27, creatinine 0.9,  blood sugar  150.   IMPRESSION:  1.  Chest pain with abnormal electrocardiogram and borderline elevation of      enzymes, rule out myocardial infarction.  2.  Hypertensive cardiovascular disease.  3.  Diabetes.  4.  Exogenous obesity.   DISPOSITION:  1.  She is being admitted to telemetry.  2.  Will get serial EKGs and enzymes.  3.  Will treat her with IV heparin, IV nitroglycerin, beta-blockers and      aspirin.  4.  Will replete her potassium which is borderline low at 3.6.  5.  We will hold her Glucophage.  6.  Anticipate probable cardiac catheterization in a.m. by Dr. Lady Deutscher      or associate in [facility].       TB/MEDQ  D:  09/12/2004  T:  09/12/2004  Job:  [id number]   cc:   Sibyl Parr. Darrick Penna, M.D.  Fax: [phone]   Elmore Guise., M.D.  [address]  [place], [place] [address]  Fax: [phone]

## 2011-04-26 NOTE — Cardiovascular Report (Signed)
Meredith [name], Meredith [name] NO.:  [id number]   MEDICAL RECORD NO.:  [id number]          PATIENT TYPE:  INP   LOCATION:  2005                         FACILITY:  [facility]   PHYSICIAN:  Elmore Guise., M.D.DATE OF BIRTH:  01-09-1951   DATE OF PROCEDURE:  09/13/2004  DATE OF DISCHARGE:                              CARDIAC CATHETERIZATION   PROCEDURES PERFORMED:  1.  Left heart catheterization.  2.  Coronary angiogram.  3.  Left ventricular angiogram.  4.  Limited right femoral angiogram with successful Angio-Seal deployment.   CARDIOLOGIST:  Rosine Abe, M.D.   DESCRIPTION OF PROCEDURE:  The patient was brought into the cardiac cath lab  with appropriate informed consent.  She was prepped and draped in a sterile  fashion.  Approximately 10 mL of lidocaine was used for local anesthesia. A  6 French sheath was placed in the right femoral artery without difficulty.  Routine coronary angiography, LV angiogram and right femoral angiogram were  then performed without difficulty.   The patient tolerated the procedure well.   FINDINGS:  Left Main:  Left main is normal.   LAD:  Twenty percent proximal stenosis.  Mid and distal mild luminal  irregularities.   Left Circumflex:  Nondominant, large vessel, normal.   RCA:  Large vessel, dominant.  Normal.   LV:  EF 55-60% and no wall motion abnormalities.  LVEDP was 18 mmHg.   Right common femoral artery was normal with appropriate arteriotomy site or  Angio-Seal deployment.  Angio-Seal was deployed successfully.   IMPRESSION:  1.  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  2.  Preserved left ventricular systolic function, ejection fraction 55-60%,      no wall motion abnormalities.   PLAN:  Risk factor modification.       TWK/MEDQ  D:  09/13/2004  T:  09/14/2004  Job:  [id number]

## 2011-05-04 ENCOUNTER — Emergency Department (HOSPITAL_COMMUNITY)
Admission: EM | Admit: 2011-05-04 | Discharge: 2011-05-04 | Disposition: A | Discharge status: Home / Self Care | Payer: BC Managed Care – PPO | Attending: Emergency Medicine | Admitting: Emergency Medicine

## 2011-05-04 ENCOUNTER — Emergency Department (HOSPITAL_COMMUNITY): Payer: BC Managed Care – PPO

## 2011-05-04 DIAGNOSIS — I10 Essential (primary) hypertension: Secondary | ICD-10-CM | POA: Insufficient documentation

## 2011-05-04 DIAGNOSIS — Z79899 Other long term (current) drug therapy: Secondary | ICD-10-CM | POA: Insufficient documentation

## 2011-05-04 DIAGNOSIS — J45909 Unspecified asthma, uncomplicated: Secondary | ICD-10-CM | POA: Insufficient documentation

## 2011-05-04 DIAGNOSIS — E119 Type 2 diabetes mellitus without complications: Secondary | ICD-10-CM | POA: Insufficient documentation

## 2011-05-04 DIAGNOSIS — E669 Obesity, unspecified: Secondary | ICD-10-CM | POA: Insufficient documentation

## 2011-05-04 DIAGNOSIS — M5430 Sciatica, unspecified side: Secondary | ICD-10-CM | POA: Insufficient documentation

## 2011-05-04 DIAGNOSIS — M79609 Pain in unspecified limb: Secondary | ICD-10-CM | POA: Insufficient documentation

## 2011-05-04 DIAGNOSIS — K219 Gastro-esophageal reflux disease without esophagitis: Secondary | ICD-10-CM | POA: Insufficient documentation

## 2011-05-04 DIAGNOSIS — M25559 Pain in unspecified hip: Secondary | ICD-10-CM | POA: Insufficient documentation

## 2011-05-04 DIAGNOSIS — Z7982 Long term (current) use of aspirin: Secondary | ICD-10-CM | POA: Insufficient documentation

## 2011-05-14 ENCOUNTER — Other Ambulatory Visit: Payer: Self-pay | Admitting: Family Medicine

## 2011-05-14 MED ORDER — METFORMIN HCL ER (MOD) 1000 MG PO TB24: 1000.0000 mg | ORAL_TABLET | Freq: Two times a day (BID) | ORAL | Status: DC

## 2011-05-20 ENCOUNTER — Ambulatory Visit: Payer: BC Managed Care – PPO

## 2011-05-21 ENCOUNTER — Telehealth: Payer: Self-pay | Admitting: Family Medicine

## 2011-05-21 ENCOUNTER — Other Ambulatory Visit (HOSPITAL_COMMUNITY): Payer: Self-pay | Admitting: Rheumatology

## 2011-05-21 DIAGNOSIS — R52 Pain, unspecified: Secondary | ICD-10-CM

## 2011-05-21 NOTE — Telephone Encounter (Signed)
States that her Bone doctor is telling her that Mobic is too strong and bad for her and she gave rx for a cream to put on her knee.  She will call back to let us know what she is using.

## 2011-05-23 ENCOUNTER — Ambulatory Visit: Payer: BC Managed Care – PPO

## 2011-05-23 ENCOUNTER — Ambulatory Visit
Admission: RE | Admit: 2011-05-23 | Discharge: 2011-05-23 | Disposition: A | Discharge status: Home / Self Care | Payer: BC Managed Care – PPO | Source: Ambulatory Visit | Attending: *Deleted | Admitting: *Deleted

## 2011-05-23 DIAGNOSIS — Z1231 Encounter for screening mammogram for malignant neoplasm of breast: Secondary | ICD-10-CM

## 2011-05-28 ENCOUNTER — Ambulatory Visit (HOSPITAL_COMMUNITY)
Admission: RE | Admit: 2011-05-28 | Discharge: 2011-05-28 | Disposition: A | Discharge status: Home / Self Care | Payer: BC Managed Care – PPO | Source: Ambulatory Visit | Attending: Rheumatology | Admitting: Rheumatology

## 2011-05-28 DIAGNOSIS — M25549 Pain in joints of unspecified hand: Secondary | ICD-10-CM | POA: Insufficient documentation

## 2011-05-28 DIAGNOSIS — R609 Edema, unspecified: Secondary | ICD-10-CM | POA: Insufficient documentation

## 2011-05-28 DIAGNOSIS — M19049 Primary osteoarthritis, unspecified hand: Secondary | ICD-10-CM | POA: Insufficient documentation

## 2011-05-28 DIAGNOSIS — R52 Pain, unspecified: Secondary | ICD-10-CM

## 2011-06-13 ENCOUNTER — Ambulatory Visit (INDEPENDENT_AMBULATORY_CARE_PROVIDER_SITE_OTHER): Payer: BC Managed Care – PPO | Admitting: Family Medicine

## 2011-06-13 ENCOUNTER — Encounter: Payer: Self-pay | Admitting: Family Medicine

## 2011-06-13 VITALS — BP 161/95 | HR 78 | Temp 98.1°F | Ht 63.0 in | Wt 258.6 lb

## 2011-06-13 DIAGNOSIS — E119 Type 2 diabetes mellitus without complications: Secondary | ICD-10-CM

## 2011-06-13 DIAGNOSIS — E7800 Pure hypercholesterolemia, unspecified: Secondary | ICD-10-CM

## 2011-06-13 DIAGNOSIS — M25569 Pain in unspecified knee: Secondary | ICD-10-CM

## 2011-06-13 DIAGNOSIS — M129 Arthropathy, unspecified: Secondary | ICD-10-CM

## 2011-06-13 DIAGNOSIS — M1990 Unspecified osteoarthritis, unspecified site: Secondary | ICD-10-CM

## 2011-06-13 MED ORDER — PRAVASTATIN SODIUM 40 MG PO TABS: 40.0000 mg | ORAL_TABLET | Freq: Every evening | ORAL | Status: DC

## 2011-06-13 MED ORDER — MELOXICAM 15 MG PO TABS: 15.0000 mg | ORAL_TABLET | Freq: Every day | ORAL | Status: DC

## 2011-06-13 NOTE — Assessment & Plan Note (Signed)
Patient is tolerating her [place] well at this point in time. If the patient's LDL is still above goal will increase her to 40 mg of Pravachol daily and recheck fasting lipid panel.

## 2011-06-13 NOTE — Patient Instructions (Signed)
It was good to see you again today. I am refilling both your meloxicam and your pravachol.  We are increasing the dose of your pravachol. I want you to call the doctors you have been seeing and get them to send copies of their records to Dr. Louanne Belton at [facility]. Come back to see me in two weeks.

## 2011-06-13 NOTE — Assessment & Plan Note (Signed)
Since seeing the patient this morning, I have obtained a copy of the [facility]. It would appear that they are uncertain whether the patient has rheumatoid arthritis, osteoarthritis, or crystal arthropathy. Per the patient's report, and ultrasound of her hand was normal. I do not have a report of this from her rheumatologist. I have requested that the patient have this sent for my review. It would appear the patient has been offered physical therapy and has lined. The only significant abnormalities on the patient's lab results are a mildly elevated uric acid a mildly elevated BUN was slightly low hemoglobin.  It would also appear that the physicians at the sports medicine and orthopedic Center were concerned that long-term use of meloxicam would increase her risk for either heart attack or stroke. The patient was quite upset from that discussion and she felt that she was being prescribed a medicine that would cause the aforementioned. Discussed with the patient that, while this medicine may increase her risk somewhat, is likely to be more helpful than opiate analgesics. Also discussed with the patient that knee replacement surgery is likely going to be the only true fix for her problem.  I have requested the patient obtain copies of her rheumatologists notes for my review and we'll see the patient back in two weeks to further discuss treatment options.

## 2011-06-13 NOTE — Progress Notes (Signed)
Subjective: the patient presents today to discuss results of her lab work as well as her knee pain.  Lab work: review the patient's lab work shows that her LDL is above goal. It would appear that since her last labs were drawn, that no changes have been made to her medication. The patient reports that she is tolerating her Pravachol well with no problems with muscle aches, visual changes, or problems with urination. The patient's A1c is also noted to be 7.6, which is slightly higher than previously.  Knee pain: the patient is somewhat confused by what several of her doctors have been telling her. She feels that the individual she is seeing at the [facility] have only been concerned with her hands, not her knees and hips. She reports continued severe pain in both of her knees and in her left hip. She also reports having been to the emergency department where she was diagnosed with a pinched nerve in her left hip. The patient is also somewhat distressed by the fact she was told she had an irregular heart beat at the [facility].  Objective: vital signs reviewed. Blood pressure is mildly elevated with the systolic of 161.  General: no acute distress, obese. Cardiovascular: after careful auscultation I detect no irregularity in the patient's heart rate, and no murmurs. Respiratory: lungs are cleared auscultation bilaterally with good air movement abdomen: abdomen this option and nontender with no organomegaly detected. Musculoskeletal: patient has mildly enlarged DIP and PIP joints bilaterally. She has significant pain with movement of both knees. Exam is essentially unchanged from her previous appointment. Skin: no rashes, or other suspicious lesions identified.

## 2011-06-13 NOTE — Assessment & Plan Note (Signed)
The patient's A1 C has climbed slightly today. At this point in time I do not intend to start any additional medication, although I may consider doing so at a future visit. Will refer the patient for medical nutritional therapy but for her diabetes and for her hyperlipidemia.

## 2011-06-20 ENCOUNTER — Telehealth: Payer: Self-pay | Admitting: Family Medicine

## 2011-06-20 NOTE — Telephone Encounter (Signed)
Is experiencing a lot of pain in her hip and needs medication for it.  She is scheduled to come back next Friday and would like something to last her until then.

## 2011-06-21 MED ORDER — TRAMADOL HCL 50 MG PO TABS: 50.0000 mg | ORAL_TABLET | Freq: Four times a day (QID) | ORAL | Status: DC | PRN

## 2011-06-21 NOTE — Telephone Encounter (Signed)
Yes, she should take it in addition.

## 2011-06-21 NOTE — Telephone Encounter (Signed)
Spoke with patient and informed of below 

## 2011-06-21 NOTE — Telephone Encounter (Signed)
Will send in 30 tablets of tramadol.  If she requires more than this, she will require an appointment.  Will not refill this medication without an appointment to discuss pain management.

## 2011-06-21 NOTE — Telephone Encounter (Signed)
Meredith [name], I spoke with patient and she is wanting to know if she is to take this along with other medications, espically  Mobic. ---[place] Dec

## 2011-06-25 ENCOUNTER — Telehealth: Payer: Self-pay | Admitting: Family Medicine

## 2011-06-25 NOTE — Telephone Encounter (Signed)
Called pt to schedule for medical nutr therapy (MNT), but she wants to wait to schedule until her hip is less painful.  Will call to schedule.

## 2011-06-27 ENCOUNTER — Other Ambulatory Visit: Payer: Self-pay | Admitting: Family Medicine

## 2011-06-27 NOTE — Telephone Encounter (Signed)
Refill request

## 2011-06-28 ENCOUNTER — Ambulatory Visit (INDEPENDENT_AMBULATORY_CARE_PROVIDER_SITE_OTHER): Payer: BC Managed Care – PPO | Admitting: Family Medicine

## 2011-06-28 ENCOUNTER — Encounter: Payer: Self-pay | Admitting: Family Medicine

## 2011-06-28 DIAGNOSIS — M069 Rheumatoid arthritis, unspecified: Secondary | ICD-10-CM

## 2011-06-28 DIAGNOSIS — M25559 Pain in unspecified hip: Secondary | ICD-10-CM

## 2011-06-28 DIAGNOSIS — M25552 Pain in left hip: Secondary | ICD-10-CM

## 2011-06-28 NOTE — Assessment & Plan Note (Signed)
The patient's pain is relatively unchanged from her previous visit. She is very resistant to the idea of taking any medications for her pain. She has had x-rays of her hip that show no acute process, and has had no problems with loss of sensation in her leg, no loss of bowel or bladder function. Discussed with the patient that this is most likely related to her osteoarthritis accompanied by her obesity. Treatment options discussed included chronic pain medication, physical therapy, orthopedic referral, and visiting a different sports medicine clinic. The patient decided that she would visit the [place] sports medicine clinic and would return to see me after having been seen by them. Has the patient's pain has been causing significant problems with her sleep, I am willing to continue prescribing tramadol, on a for her to have to pills daily. The patient was instructed to take one before bed, and one when she awakened several hours later with increased pain.

## 2011-06-28 NOTE — Progress Notes (Signed)
Subjective: the patient reports continued problems with pain in her left hip. The pain appears to originate slightly below her left buttock and radiates down the back of her leg. She does not describe it as numbness, and does not describe any shooting pains. Her pain is relatively sharp, and is made significantly worse by movement. She continues to take Mobic and has been taking one tramadol nightly. She is having problems with awakening approximately 5 hours after taking this with renewed pain.  Objective: vitals reviewed. General: alert and oriented, slightly tearful. Extremities: there is no point tenderness anywhere in the left hip. There is significant pain with both flexion and extension.

## 2011-06-28 NOTE — Patient Instructions (Signed)
It was great to see you today! Continue to take your Mobic and tramadol. Take one of the tramadol prior to going to bed, and a second when you wake up sometime later with pain. Make an appointment to be seen in the sports medicine clinic and come back to see me after you have been seen by them.

## 2011-07-12 ENCOUNTER — Ambulatory Visit (INDEPENDENT_AMBULATORY_CARE_PROVIDER_SITE_OTHER): Payer: BC Managed Care – PPO | Admitting: Family Medicine

## 2011-07-12 VITALS — BP 166/99

## 2011-07-12 DIAGNOSIS — M79609 Pain in unspecified limb: Secondary | ICD-10-CM

## 2011-07-12 DIAGNOSIS — M79605 Pain in left leg: Secondary | ICD-10-CM

## 2011-07-12 NOTE — Patient Instructions (Signed)
Take by tablet by mouth Day 1-3:   take one at night  Day 4-6:  Take 2 at night Day 7-10:  take 3 at night Day 11-14:  3 at night, 1 in am Day 15-18:   3 at night, 2 in am Day 19-21: 3 at night, 3 in am Day 22-25: 3 at night, 3 in am, 2 at lunch Day 26 forward : 3 at night, 3 in am, 3 at lunch   

## 2011-07-14 NOTE — Progress Notes (Signed)
  Subjective:    Patient ID: Meredith [name], female    DOB: 1951/11/23, 60 y.o.   MRN: [id number]  HPI  Left hip pain for 4 months. No specific injury--gradual onset until now it is 7/10 most of the time. Limits her ability to lie in bed or roll over comfortable. Not as painful when walking. Feels crampy pain in mid upper posterior left thigh up to hip. No groin pain. No incontinence. No back pain. No weakness or giving way of leg. This is the issue she wants addressed most.  2. Right knee arthritis--long term. Somewhat improved on mobic. No locking or giving way, feels stiff but no catching.   PERTINENT  PMH / PSH: hS Hs x ray of hips and knee Review of Systems    Pertinent review of systems: negative for fever or unusual weight change.  Objective:   Physical Exam   GEN WD WN obese NAD when seated or walking.  HIPS: full IR/ER that is painless. Pain with left lower leg extension. In posterior thigh but neg SLR for radicular pain. LEFT UPPER LEG: ttp lateral hamstring area. No defect. No mass. Strength is full but resisted hamstring testing reproduces pain with significant muscle cramping. Normal sensation and vascular status B lower extremity.  BACK nontender to palpation and percussion. Limited hyperextension at hips secondary to habitus. Flexion at hips is full within range of her habitus--some increase inposterior leg / thigh pain with this maneuver.    Assessment & Plan:  1. posterior left thigh pain--very unclear what this is--seems most consistent with hamstring strain, etiology unknown. Reviewed her hip and knee films--mild OA only in hip. Mild mod in knee. lomg discussion--agreed to try neurontin taper up and rtc 4 w

## 2011-07-15 ENCOUNTER — Telehealth: Payer: Self-pay | Admitting: Family Medicine

## 2011-07-15 MED ORDER — GABAPENTIN 100 MG PO CAPS: ORAL_CAPSULE | ORAL | Status: AC

## 2011-07-15 NOTE — Telephone Encounter (Signed)
Amy Ok there now  Let her know Denny Levy

## 2011-07-15 NOTE — Telephone Encounter (Signed)
Pt notified rx is at pharmacy.

## 2011-07-23 ENCOUNTER — Telehealth: Payer: Self-pay | Admitting: Family Medicine

## 2011-07-23 NOTE — Telephone Encounter (Signed)
Pt states that meds are not working and still having pain in legs.  SM told her to call here to see what we can do.

## 2011-07-28 NOTE — Telephone Encounter (Signed)
Pt needs to continue the taper of gabapentin as prescribed by the sports med clinic.  It may take some time to take full effect.  Any stronger medicine that what she already has requires an appointment.

## 2011-07-29 NOTE — Telephone Encounter (Signed)
Pt stated that the pain is really bad at night and its hard for to sleep. I told her to continue taking the Gabapentin and she can take tylenol in between the doses of gabapentin. Should she feel that this is still not working for her to follow up with [place]. She agreed and will keep the appt that she already has.Meredith [name] [place]

## 2011-08-23 ENCOUNTER — Ambulatory Visit (INDEPENDENT_AMBULATORY_CARE_PROVIDER_SITE_OTHER): Payer: BC Managed Care – PPO | Admitting: Family Medicine

## 2011-08-23 DIAGNOSIS — M25559 Pain in unspecified hip: Secondary | ICD-10-CM

## 2011-08-23 DIAGNOSIS — M25552 Pain in left hip: Secondary | ICD-10-CM

## 2011-08-23 NOTE — Patient Instructions (Signed)
MRI APPT IS ON MON SEPT 24TH AT 12 NOON ARRIVE AT 11:45 FOR REGISTRATION AT [facility] ADMITTING. [phone].

## 2011-08-23 NOTE — Progress Notes (Signed)
  Subjective:    Patient ID: Meredith [name], female    DOB: November 18, 1951, 60 y.o.   MRN: [id number]  HPI  Continued pain in the left posterior thigh. We had tried her on gabapentin and it made absolutely no difference. She's had some occasional weakness of that leg and has fallen twice. When she puts the leg she has some pain in the leg just will not hold her up. She's not having any numbness or tingling in the leg. She's having no incontinence. Pain is worse with certain motions such as rolling over in bed or getting up from a chair. Pain is centered in the left proximal thigh and goes down to about mid thigh. There is some radiation up into the left buttock but not up into the back.  Review of Systems Denies unusual weight change. No fever, no sweats, no chills. Has had no rash.    Objective:   Physical Exam  GENERAL: Well-developed overweight female. Mild distress when she tries to get up on the table or off the table. Also pain with walking. HIP left. Internal and external rotation are painless and full. She is tender to palpation at the proximal portion of the hamstring in this area reproduces her pain when palpated. The SI joint is nontender to palpation. FABER  painful. Straight leg raising seated and supine position is negative. Her lower extremity strength at the hip flexors is 5 out of 5. Strength testing of the hamstring seems intact but does cause instantaneous cramping in the hamstring.      Assessment & Plan:  Posterior thigh pain. Unclear exactly whether this is a hamstring injury where she had some type of entrapment of the sciatic nerve. We will do MRI of the hip, and I'll call for results. Since the gabapentin was not helpful we will stop that.

## 2011-08-27 ENCOUNTER — Other Ambulatory Visit: Payer: Self-pay | Admitting: Family Medicine

## 2011-08-27 NOTE — Telephone Encounter (Signed)
Refill request

## 2011-08-29 LAB — CBC
MCHC: 33.3
MCV: 79.4
Platelets: 204
WBC: 9

## 2011-08-29 LAB — I-STAT 8, (EC8 V) (CONVERTED LAB)
Bicarbonate: 30.2 — ABNORMAL HIGH
HCT: 39
Hemoglobin: 13.3
Operator id: 196461
Sodium: 137
TCO2: 32

## 2011-08-29 LAB — POCT CARDIAC MARKERS
CKMB, poc: 3.9
Operator id: 196461
Operator id: 196461
Troponin i, poc: <0.05
Troponin i, poc: <0.05

## 2011-08-29 LAB — DIFFERENTIAL
Basophils Absolute: 0
Basophils Relative: 0
Eosinophils Absolute: 0.1
Neutrophils Relative %: 68

## 2011-09-02 ENCOUNTER — Ambulatory Visit (HOSPITAL_COMMUNITY)
Admission: RE | Admit: 2011-09-02 | Discharge: 2011-09-02 | Disposition: A | Discharge status: Home / Self Care | Payer: BC Managed Care – PPO | Source: Ambulatory Visit | Attending: Family Medicine | Admitting: Family Medicine

## 2011-09-02 DIAGNOSIS — M169 Osteoarthritis of hip, unspecified: Secondary | ICD-10-CM | POA: Insufficient documentation

## 2011-09-02 DIAGNOSIS — M79609 Pain in unspecified limb: Secondary | ICD-10-CM | POA: Insufficient documentation

## 2011-09-02 DIAGNOSIS — M1610 Unilateral primary osteoarthritis, unspecified hip: Secondary | ICD-10-CM | POA: Insufficient documentation

## 2011-09-02 DIAGNOSIS — M47817 Spondylosis without myelopathy or radiculopathy, lumbosacral region: Secondary | ICD-10-CM | POA: Insufficient documentation

## 2011-09-02 DIAGNOSIS — M25552 Pain in left hip: Secondary | ICD-10-CM

## 2011-09-02 DIAGNOSIS — M25559 Pain in unspecified hip: Secondary | ICD-10-CM | POA: Insufficient documentation

## 2011-09-02 LAB — URINALYSIS, ROUTINE W REFLEX MICROSCOPIC
Glucose, UA: NEGATIVE
Glucose, UA: 100 — AB
Hgb urine dipstick: NEGATIVE
Ketones, ur: NEGATIVE
Nitrite: NEGATIVE
Protein, ur: NEGATIVE
Specific Gravity, Urine: 1.033 — ABNORMAL HIGH
pH: 5

## 2011-09-02 LAB — URINE MICROSCOPIC-ADD ON

## 2011-09-02 LAB — BASIC METABOLIC PANEL
BUN: 8
BUN: 9
CO2: 30
Calcium: 9.9
Chloride: 100
Creatinine, Ser: 0.86
Glucose, Bld: 147 — ABNORMAL HIGH
Potassium: 3.1 — ABNORMAL LOW

## 2011-09-02 LAB — CBC
HCT: 34.3 — ABNORMAL LOW
HCT: 32.8 — ABNORMAL LOW
Hemoglobin: 11 — ABNORMAL LOW
MCV: 80
Platelets: 197
Platelets: 192
RBC: 4.11
RDW: 14.2
WBC: 9.1

## 2011-09-02 LAB — DIFFERENTIAL
Basophils Relative: 0
Eosinophils Absolute: 0.9 — ABNORMAL HIGH
Eosinophils Relative: 10 — ABNORMAL HIGH
Lymphocytes Relative: 2 — ABNORMAL LOW
Lymphs Abs: 0.1 — ABNORMAL LOW
Lymphs Abs: 2.4
Monocytes Absolute: 0.1
Monocytes Absolute: 0.7
Monocytes Relative: 2 — ABNORMAL LOW
Monocytes Relative: 8
Neutro Abs: 7.6

## 2011-09-02 LAB — COMPREHENSIVE METABOLIC PANEL
Albumin: 3.9
Alkaline Phosphatase: 40
BUN: 25 — ABNORMAL HIGH
Calcium: 9.9
Potassium: 3.2 — ABNORMAL LOW
Total Protein: 7.7

## 2011-09-03 ENCOUNTER — Encounter: Payer: Self-pay | Admitting: Family Medicine

## 2011-09-03 ENCOUNTER — Telehealth: Payer: Self-pay | Admitting: Family Medicine

## 2011-09-03 DIAGNOSIS — M1610 Unilateral primary osteoarthritis, unspecified hip: Secondary | ICD-10-CM | POA: Insufficient documentation

## 2011-09-03 NOTE — Telephone Encounter (Signed)
  Spoke w her re her MRI hip--acetabular roof arthritis--she agrees to see orthopedist for options.I will set her up

## 2011-09-04 NOTE — Telephone Encounter (Signed)
Pt scheduled for appt with Dr. Luiz Blare 09/12/11 @ 8:30 am.  Pt notified of appt via phone, referral info faxed to Dr. Luiz Blare' s office.

## 2011-09-11 LAB — COMPREHENSIVE METABOLIC PANEL
AST: 16
Alkaline Phosphatase: 52
CO2: 31
Chloride: 104
Creatinine, Ser: 0.68
GFR calc Af Amer: >60
GFR calc non Af Amer: >60
Potassium: 3.4 — ABNORMAL LOW
Total Bilirubin: 0.7

## 2011-09-11 LAB — URINALYSIS, ROUTINE W REFLEX MICROSCOPIC
Glucose, UA: NEGATIVE
Hgb urine dipstick: NEGATIVE
Specific Gravity, Urine: 1.021
Urobilinogen, UA: 1

## 2011-09-11 LAB — DIFFERENTIAL
Basophils Absolute: 0
Basophils Relative: 0
Eosinophils Absolute: 0.1
Eosinophils Relative: 1
Lymphocytes Relative: 15
Monocytes Absolute: 0.3

## 2011-09-11 LAB — CBC
HCT: 41.3
MCV: 81.1
RBC: 5.09
WBC: 10.2

## 2011-09-11 LAB — LIPASE, BLOOD: Lipase: 27

## 2011-09-27 ENCOUNTER — Encounter: Payer: Self-pay | Admitting: Family Medicine

## 2011-09-27 ENCOUNTER — Ambulatory Visit (INDEPENDENT_AMBULATORY_CARE_PROVIDER_SITE_OTHER): Payer: BC Managed Care – PPO | Admitting: Family Medicine

## 2011-09-27 VITALS — BP 165/86 | HR 67 | Temp 98.0°F | Ht 63.0 in | Wt 262.0 lb

## 2011-09-27 DIAGNOSIS — E119 Type 2 diabetes mellitus without complications: Secondary | ICD-10-CM

## 2011-09-27 DIAGNOSIS — I10 Essential (primary) hypertension: Secondary | ICD-10-CM

## 2011-09-27 DIAGNOSIS — M25569 Pain in unspecified knee: Secondary | ICD-10-CM

## 2011-09-27 DIAGNOSIS — M5137 Other intervertebral disc degeneration, lumbosacral region: Secondary | ICD-10-CM

## 2011-09-27 NOTE — Patient Instructions (Signed)
It was good to see you today! We will check some blood work today and decide on your blood pressure at your next appointment. Make sure to get your MRI and see your orthopedist about what should be done about your leg pain. If you start having problems with fevers/chills or right knee swelling let me know.

## 2011-09-28 ENCOUNTER — Encounter: Payer: Self-pay | Admitting: Family Medicine

## 2011-09-28 LAB — CBC
MCH: 26 pg (ref 26.0–34.0)
MCHC: 33.7 g/dL (ref 30.0–36.0)
MCV: 77.1 fL — ABNORMAL LOW (ref 78.0–100.0)
Platelets: 199 10*3/uL (ref 150–400)
RBC: 4.89 MIL/uL (ref 3.87–5.11)
RDW: 14.4 % (ref 11.5–15.5)

## 2011-09-28 LAB — BASIC METABOLIC PANEL
Calcium: 10.4 mg/dL (ref 8.4–10.5)
Creat: 0.7 mg/dL (ref 0.50–1.10)
Sodium: 141 mEq/L (ref 135–145)

## 2011-09-29 ENCOUNTER — Other Ambulatory Visit: Payer: Self-pay | Admitting: Family Medicine

## 2011-09-30 NOTE — Telephone Encounter (Signed)
Refill request

## 2011-10-06 NOTE — Progress Notes (Signed)
Subjective: The patient presents today with several complaints. 1) left leg and hip pain: the patient is been seen by her orthopedist who is recommended an MRI of the lower back. This has not yet been scheduled, and the patient has yet to make contact the person in the orthopedist office who would schedule this. Her pain is staple, with no saddle anesthesia, and no problems with bowel or bladder incontinence. 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 She has had steroid injections in the past which she says that helps somewhat. Her last injection was greater than six months ago. She requested one be done today. She has no swelling, erythema, or other acute changes. 3) the patient's A1 C is due to be checks today. She continues to take only metformin for her diabetes.  Objective:  Filed Vitals:   09/27/11 1029  BP: 165/86  Pulse: 67  Temp: 98 F (36.7 C)   Gen: No acute distress, morbidly obese CV: Regular rate and rhythm Resp: Clear to auscultation bilaterally Extremities: strength is preserved in both lower extremities. The right knee demonstrate significant crepitus with movement. There is no swelling, erythema, or evidence of infection.  A steroid injection was performed at right knee, using a lateral approach, with 1% plain Lidocaine and 10 mg of Kenalog. This was well tolerated.  Dr. Deirdre Priest was present for injection  Assessment/Plan: Steroid injection in the right knee today. The patient's blood pressure is mildly elevated today. I will not make any changes until we have a more recent creatinine level. The patient is urged to follow her orthopedist treatment plan for her left leg and hip pain.  Please also see individual problems in problem list for problem-specific plans.

## 2011-10-06 NOTE — Assessment & Plan Note (Signed)
Steroid Injection today.

## 2011-10-06 NOTE — Assessment & Plan Note (Signed)
The patient's blood pressure is mildly elevated today. I will not make any changes until we have a more recent creatinine level.

## 2011-10-06 NOTE — Assessment & Plan Note (Signed)
Per ortho.  Urged to get MRI.

## 2011-10-08 ENCOUNTER — Other Ambulatory Visit: Payer: Self-pay | Admitting: Family Medicine

## 2011-10-08 NOTE — Telephone Encounter (Signed)
Refill request

## 2011-10-11 ENCOUNTER — Ambulatory Visit (INDEPENDENT_AMBULATORY_CARE_PROVIDER_SITE_OTHER): Payer: BC Managed Care – PPO | Admitting: Family Medicine

## 2011-10-11 ENCOUNTER — Encounter: Payer: Self-pay | Admitting: Family Medicine

## 2011-10-11 VITALS — BP 109/77 | HR 90 | Temp 98.8°F | Ht 63.0 in | Wt 255.0 lb

## 2011-10-11 DIAGNOSIS — J040 Acute laryngitis: Secondary | ICD-10-CM | POA: Insufficient documentation

## 2011-10-11 MED ORDER — GUAIFENESIN ER 600 MG PO TB12: 600.0000 mg | ORAL_TABLET | Freq: Two times a day (BID) | ORAL | Status: DC

## 2011-10-11 MED ORDER — FLUTICASONE PROPIONATE 50 MCG/ACT NA SUSP: 1.0000 | Freq: Every day | NASAL | Status: DC

## 2011-10-11 MED ORDER — CETIRIZINE HCL 10 MG PO CAPS: 10.0000 mg | ORAL_CAPSULE | Freq: Every day | ORAL | Status: DC

## 2011-10-11 NOTE — Progress Notes (Signed)
  Subjective:    Patient ID: Meredith [name], female    DOB: 11-07-1951, 60 y.o.   MRN: [id number]  HPI The patient comes in for acute visit to discuss nasal congestion and hoarseness. She describes 5 days of hoarseness as well as stuffy nose, and she has frontal headaches with cough. She reports that she does not feel ill. She has hay fever symptoms every spring. She has been using Vicks inhalers as well as Halls cough drops to help restore her voice. These have not helped.  She denies any sick contacts.  She lives alone. She used to be a smoker more than 40 years ago.    Review of Systems See hpi    Objective:   Physical Exam  Well-appearing in good spirits in no acute distress.  HEENT: Tympanic membranes are clear. Conjunctivae are mildly injected. Pupils are equally round and reactive. Oropharynx with copious watery discharge. She has no frontal or maxillary facial tenderness. Neck is some supple withoutsome mild cobblestoning. Nasal mucosae are bluish in hue and with copious watery discharge. Cervical adenopathy is absent.  Lungs: Clear to auscultation bilaterally without wheezes or rales.  Heart: Regular S1-S2 without extra sounds.      Assessment & Plan:

## 2011-10-11 NOTE — Patient Instructions (Addendum)
It was a pleasure to meet you today.  I believe your hoarseness is due to laryngitis, possibly allergic.   I recommend a three-part approach:   1. Nasal spray.  Start using an over the counter saline nasal spray one or two times daily, to rinse secretions from your nasal passages.  Then, in the morning, use Flonase nasal spray 2 sprays/nostril, one time daily.  Rinse mouth and spit after using the FLonase.  2. Anti-allergy medicine. Zyrtec 10mg  one time daily.  3. Mucus-loosening medicine.  Mucinex 600mg , one tablet twice daily.

## 2011-10-11 NOTE — Assessment & Plan Note (Signed)
She describes seasonal allergies each fall, accompanied by laryngitis and hoarseness. She does not feel sick. She is not an active smoker and has not been for decades. She declines a flu shot after careful counseling today.  For her laryngitis, I'm prescribing her in antihistamine as well as nasal steroid spray. I discussed at length the use of a steroid spray as well as pretreatment with nasal saline. Also mucolytic treatment with guaifenesin. See instruction sheet for further details. Followup as needed.

## 2011-10-13 ENCOUNTER — Emergency Department (HOSPITAL_COMMUNITY)
Admission: EM | Admit: 2011-10-13 | Discharge: 2011-10-13 | Disposition: A | Discharge status: Home / Self Care | Payer: BC Managed Care – PPO | Attending: Emergency Medicine | Admitting: Emergency Medicine

## 2011-10-13 ENCOUNTER — Emergency Department (HOSPITAL_COMMUNITY): Payer: BC Managed Care – PPO

## 2011-10-13 DIAGNOSIS — K219 Gastro-esophageal reflux disease without esophagitis: Secondary | ICD-10-CM | POA: Insufficient documentation

## 2011-10-13 DIAGNOSIS — E119 Type 2 diabetes mellitus without complications: Secondary | ICD-10-CM | POA: Insufficient documentation

## 2011-10-13 DIAGNOSIS — K5900 Constipation, unspecified: Secondary | ICD-10-CM | POA: Insufficient documentation

## 2011-10-13 DIAGNOSIS — I10 Essential (primary) hypertension: Secondary | ICD-10-CM | POA: Insufficient documentation

## 2011-10-13 DIAGNOSIS — R112 Nausea with vomiting, unspecified: Secondary | ICD-10-CM | POA: Insufficient documentation

## 2011-10-13 DIAGNOSIS — J45909 Unspecified asthma, uncomplicated: Secondary | ICD-10-CM | POA: Insufficient documentation

## 2011-10-13 DIAGNOSIS — R109 Unspecified abdominal pain: Secondary | ICD-10-CM | POA: Insufficient documentation

## 2011-10-13 DIAGNOSIS — M129 Arthropathy, unspecified: Secondary | ICD-10-CM | POA: Insufficient documentation

## 2011-10-13 DIAGNOSIS — E669 Obesity, unspecified: Secondary | ICD-10-CM | POA: Insufficient documentation

## 2011-10-13 DIAGNOSIS — Z79899 Other long term (current) drug therapy: Secondary | ICD-10-CM | POA: Insufficient documentation

## 2011-10-13 LAB — DIFFERENTIAL
Basophils Absolute: 0 10*3/uL (ref 0.0–0.1)
Eosinophils Relative: 2 % (ref 0–5)
Lymphocytes Relative: 22 % (ref 12–46)
Lymphs Abs: 2.2 10*3/uL (ref 0.7–4.0)
Neutro Abs: 6.8 10*3/uL (ref 1.7–7.7)
Neutrophils Relative %: 71 % (ref 43–77)

## 2011-10-13 LAB — COMPREHENSIVE METABOLIC PANEL
AST: 9 U/L (ref 0–37)
Albumin: 3.4 g/dL — ABNORMAL LOW (ref 3.5–5.2)
Alkaline Phosphatase: 66 U/L (ref 39–117)
BUN: 25 mg/dL — ABNORMAL HIGH (ref 6–23)
CO2: 31 mEq/L (ref 19–32)
Chloride: 100 mEq/L (ref 96–112)
Creatinine, Ser: 0.66 mg/dL (ref 0.50–1.10)
GFR calc non Af Amer: >90 mL/min (ref >90)
Potassium: 3.3 mEq/L — ABNORMAL LOW (ref 3.5–5.1)
Total Bilirubin: 0.3 mg/dL (ref 0.3–1.2)

## 2011-10-13 LAB — URINALYSIS, ROUTINE W REFLEX MICROSCOPIC
Glucose, UA: NEGATIVE mg/dL
Hgb urine dipstick: NEGATIVE
Ketones, ur: NEGATIVE mg/dL
Leukocytes, UA: NEGATIVE
Protein, ur: NEGATIVE mg/dL
pH: 5 (ref 5.0–8.0)

## 2011-10-13 LAB — CBC
HCT: 36.2 % (ref 36.0–46.0)
MCV: 75.3 fL — ABNORMAL LOW (ref 78.0–100.0)
RBC: 4.81 MIL/uL (ref 3.87–5.11)
RDW: 13.6 % (ref 11.5–15.5)
WBC: 9.6 10*3/uL (ref 4.0–10.5)

## 2011-10-13 MED ORDER — GI COCKTAIL ~~LOC~~
30.0000 mL | Freq: Once | ORAL | Status: AC
  Administered 2011-10-13: 30 mL via ORAL

## 2011-10-13 MED ORDER — DICYCLOMINE HCL 20 MG PO TABS
20.0000 mg | ORAL_TABLET | Freq: Once | ORAL | Status: AC
  Administered 2011-10-13: 20 mg via ORAL
  Filled 2011-10-13: qty 1

## 2011-10-31 MED FILL — Morphine Sulfate Inj 4 MG/ML: INTRAMUSCULAR | Qty: 1 | Status: AC

## 2011-11-05 ENCOUNTER — Other Ambulatory Visit: Payer: Self-pay | Admitting: Family Medicine

## 2011-11-05 NOTE — Telephone Encounter (Signed)
Refill request

## 2011-12-08 ENCOUNTER — Emergency Department (HOSPITAL_COMMUNITY)
Admission: EM | Admit: 2011-12-08 | Discharge: 2011-12-09 | Disposition: A | Discharge status: Home / Self Care | Payer: BC Managed Care – PPO | Attending: Emergency Medicine | Admitting: Emergency Medicine

## 2011-12-08 ENCOUNTER — Encounter (HOSPITAL_COMMUNITY): Payer: Self-pay | Admitting: Emergency Medicine

## 2011-12-08 DIAGNOSIS — I4891 Unspecified atrial fibrillation: Secondary | ICD-10-CM

## 2011-12-08 DIAGNOSIS — I10 Essential (primary) hypertension: Secondary | ICD-10-CM | POA: Insufficient documentation

## 2011-12-08 DIAGNOSIS — R5381 Other malaise: Secondary | ICD-10-CM | POA: Insufficient documentation

## 2011-12-08 DIAGNOSIS — R5383 Other fatigue: Secondary | ICD-10-CM | POA: Insufficient documentation

## 2011-12-08 DIAGNOSIS — E785 Hyperlipidemia, unspecified: Secondary | ICD-10-CM | POA: Insufficient documentation

## 2011-12-08 DIAGNOSIS — Z79899 Other long term (current) drug therapy: Secondary | ICD-10-CM | POA: Insufficient documentation

## 2011-12-08 DIAGNOSIS — R0602 Shortness of breath: Secondary | ICD-10-CM | POA: Insufficient documentation

## 2011-12-08 DIAGNOSIS — R000 Tachycardia, unspecified: Secondary | ICD-10-CM | POA: Insufficient documentation

## 2011-12-08 DIAGNOSIS — E119 Type 2 diabetes mellitus without complications: Secondary | ICD-10-CM | POA: Insufficient documentation

## 2011-12-08 DIAGNOSIS — M1990 Unspecified osteoarthritis, unspecified site: Secondary | ICD-10-CM | POA: Insufficient documentation

## 2011-12-09 ENCOUNTER — Emergency Department (HOSPITAL_COMMUNITY): Payer: BC Managed Care – PPO

## 2011-12-09 ENCOUNTER — Other Ambulatory Visit: Payer: Self-pay

## 2011-12-09 LAB — CBC
HCT: 35.1 % — ABNORMAL LOW (ref 36.0–46.0)
Hemoglobin: 12.3 g/dL (ref 12.0–15.0)
MCHC: 35 g/dL (ref 30.0–36.0)
MCV: 76.1 fL — ABNORMAL LOW (ref 78.0–100.0)
RDW: 13.9 % (ref 11.5–15.5)
WBC: 8 10*3/uL (ref 4.0–10.5)

## 2011-12-09 LAB — TSH: TSH: 3.438 u[IU]/mL (ref 0.350–4.500)

## 2011-12-09 LAB — POCT I-STAT, CHEM 8
BUN: 19 mg/dL (ref 6–23)
Chloride: 102 mEq/L (ref 96–112)
HCT: 38 % (ref 36.0–46.0)
Potassium: 3.7 mEq/L (ref 3.5–5.1)

## 2011-12-09 LAB — PROTIME-INR: INR: 1.02 (ref 0.00–1.49)

## 2011-12-09 LAB — POCT I-STAT TROPONIN I: Troponin i, poc: 0 ng/mL (ref 0.00–0.08)

## 2011-12-09 LAB — DIFFERENTIAL
Basophils Absolute: 0 10*3/uL (ref 0.0–0.1)
Eosinophils Relative: 3 % (ref 0–5)
Lymphocytes Relative: 31 % (ref 12–46)
Monocytes Absolute: 0.3 10*3/uL (ref 0.1–1.0)
Monocytes Relative: 4 % (ref 3–12)
Neutro Abs: 5 10*3/uL (ref 1.7–7.7)

## 2011-12-09 MED ORDER — DILTIAZEM HCL 100 MG IV SOLR
5.0000 mg/h | Freq: Once | INTRAVENOUS | Status: DC
  Filled 2011-12-09: qty 100

## 2011-12-09 MED ORDER — SODIUM BICARBONATE 8.4 % IV SOLN
INTRAVENOUS | Status: AC
  Filled 2011-12-09: qty 50

## 2011-12-09 MED ORDER — ASPIRIN 81 MG PO CHEW
324.0000 mg | CHEWABLE_TABLET | Freq: Once | ORAL | Status: AC
  Administered 2011-12-09: 324 mg via ORAL
  Filled 2011-12-09: qty 4

## 2011-12-09 MED ORDER — DILTIAZEM HCL 25 MG/5ML IV SOLN: 10.0000 mg | Freq: Once | INTRAVENOUS | Status: DC

## 2011-12-09 MED ORDER — SODIUM CHLORIDE 0.9 % IV SOLN
Freq: Once | INTRAVENOUS | Status: AC
  Administered 2011-12-09: 02:00:00 via INTRAVENOUS

## 2011-12-09 NOTE — ED Provider Notes (Addendum)
History     CSN: [id number]  Arrival date & time 12/08/11  2337   First MD Initiated Contact with Patient 12/08/11 2338      Chief Complaint  Patient presents with  . Tachycardia    pt called ems for rapid HR.     (Consider location/radiation/quality/duration/timing/severity/associated sxs/prior treatment) HPI Comments: Pt with hx of DM and Htn, states that she got home from work tonight and after eating dinner, developed a "weird feeling" which she describes as palpitatiosn in her chest - asscoiated with fatigue, onset, moderate, nothing makes better or worse. She denies associated headache, chest pain, sore throat, fevers chills cough or abdominal pain. She denies starting any new medications but does not using Mucinex intermittently over the last month. It has been several weeks since her last use. She denies weight loss, weight gain and has had no dysuria, diarrhea or rashes. She states that she may have had similar symptoms many years ago when she was younger but is unsure what it was or how it was treated and it did go away.  The history is provided by the patient and medical records.    Past Medical History  Diagnosis Date  . Diabetes mellitus   . Hypertension   . Hyperlipidemia   . Osteoarthritis     History reviewed. No pertinent past surgical history.  History reviewed. No pertinent family history.  History  Substance Use Topics  . Smoking status: Former Games developer  . Smokeless tobacco: Not on file  . Alcohol Use: No    OB History    Grav Para Term Preterm Abortions TAB SAB Ect Mult Living                  Review of Systems  All other systems reviewed and are negative.    Allergies  Flexeril; Lipitor; and Metaxalone  Home Medications   Current Outpatient Rx  Name Route Sig Dispense Refill  . CETIRIZINE HCL 10 MG PO CAPS Oral Take 10 mg by mouth daily as needed. For allergies     . GUAIFENESIN ER 600 MG PO TB12 Oral Take 600 mg by mouth 2 (two) times  daily. For congestion     . HYDROCODONE-ACETAMINOPHEN 5-325 MG PO TABS Oral Take 1 tablet by mouth every 6 (six) hours as needed. For pain     . LISINOPRIL-HYDROCHLOROTHIAZIDE 20-25 MG PO TABS  TAKE 1 TABLET BY MOUTH DAILY. 30 tablet 3  . METFORMIN HCL ER (OSM) 1000 MG PO TB24  TAKE 1 TABLET BY MOUTH TWICE A DAY 60 tablet 2  . NAPROXEN SODIUM 220 MG PO TABS Oral Take 220 mg by mouth 2 (two) times daily with a meal. For knee pain     . POLYETHYLENE GLYCOL 3350 PO POWD Oral Take 17 g by mouth as needed. Add 1 packet in a glass of water daily for constipation     . PRAVASTATIN SODIUM 40 MG PO TABS Oral Take 1 tablet (40 mg total) by mouth every evening. 30 tablet 2    BP 133/72  Pulse 78  Temp(Src) 97.7 F (36.5 C) (Oral)  Resp 17  SpO2 98%  Physical Exam  Nursing note and vitals reviewed. Constitutional: She appears well-developed and well-nourished. No distress.  HENT:  Head: Normocephalic and atraumatic.  Mouth/Throat: Oropharynx is clear and moist. No oropharyngeal exudate.  Eyes: Conjunctivae and EOM are normal. Pupils are equal, round, and reactive to light. Right eye exhibits no discharge. Left eye exhibits no discharge. No  scleral icterus.  Neck: Normal range of motion. Neck supple. No JVD present. No thyromegaly present.  Cardiovascular: Normal heart sounds and intact distal pulses.  Exam reveals no gallop and no friction rub.   No murmur heard.      Normal capillary refill less than 2 seconds, irregularly irregular, tachycardic rhythm.  Pulmonary/Chest: Effort normal and breath sounds normal. No respiratory distress. She has no wheezes. She has no rales.  Abdominal: Soft. Bowel sounds are normal. She exhibits no distension and no mass. There is no tenderness.  Musculoskeletal: Normal range of motion. She exhibits no edema and no tenderness.  Lymphadenopathy:    She has no cervical adenopathy.  Neurological: She is alert. Coordination normal.  Skin: Skin is warm and dry. No  rash noted. No erythema.  Psychiatric: She has a normal mood and affect. Her behavior is normal.    ED Course  Procedures (including critical care time)  Labs Reviewed  CBC - Abnormal; Notable for the following:    HCT 35.1 (*)    MCV 76.1 (*)    All other components within normal limits  POCT I-STAT, CHEM 8 - Abnormal; Notable for the following:    Glucose, Bld 234 (*)    All other components within normal limits  DIFFERENTIAL  APTT  PROTIME-INR  POCT I-STAT TROPONIN I  I-STAT TROPONIN I  I-STAT, CHEM 8  TSH   Dg Chest Port 1 View  12/09/2011  *RADIOLOGY REPORT*  Clinical Data: Shortness of breath.  Atrial fibrillation.  PORTABLE CHEST - 1 VIEW  Comparison: 02/02/2008  Findings: Shallow inspiration.  The heart size and pulmonary vascularity appears to be prominent with hazy perihilar opacities suggesting early edema.  No focal airspace consolidation.  No blunting of costophrenic angles.  No pneumothorax.  Tortuous aorta.  IMPRESSION: Findings suggestive of congestive failure with mild perihilar edema.  Original Report Authenticated By: Marlon Pel, M.D.     1. Atrial fibrillation       MDM  Patient appears well, has tachycardia but normal blood pressure. Rhythm strip and EKG confirm atrial fibrillation. There is associated poor R-wave progression but no overt ischemia. Will rule out other sources, rate control including Cardizem with drip.  ED ECG REPORT   Date: 12/09/2011   Rate: 137  Rhythm: atrial fibrillation with rvr  QRS Axis: normal  Intervals: afib  ST/T Wave abnormalities: nonspecific T wave changes  Conduction Disutrbances:none  Narrative Interpretation:   Old EKG Reviewed: Since last tracing on 10/19/2010, poor R wave progression persists, atrial fibrillation with rapid ventricular response has developed in place at normal sinus rhythm the   Patient converted to normal sinus rhythm prior to getting medications  ED ECG REPORT   Date: 12/09/2011  0128 AM  Rate: 78  Rhythm: normal sinus rhythm  QRS Axis: normal  Intervals: normal  ST/T Wave abnormalities: normal  Conduction Disutrbances:none  Narrative Interpretation: Q waves anteriorly  Old EKG Reviewed: changes noted and Atrial fibrillation converted to normal sinus rhythm  Discussed care with family practice residents, chest x-ray shows mild congestive heart failure, to be admitted for further evaluation   Vida Roller, MD 12/09/11 4098  Vida Roller, MD 12/09/11 [phone]

## 2011-12-09 NOTE — ED Notes (Signed)
Iv removed and instructions given pt getting dressed in no pain and vss. Cath tip intact and site l wrist clear.

## 2011-12-09 NOTE — Consult Note (Signed)
Family Medicine Teaching Service Consult Note  Patient name: Meredith [name] Medical record number: [id number] Date of birth: Jun 28, 1951 Age: 60 y.o. Gender: female  Primary Care Provider: Majel Homer, MD, MD  Chief Complaint: Feeling wierd  History of Present Illness: Meredith [name] is a 60 y.o. year old female presenting with ~2hr episode of "feeling funny/weird." Pt reports coming home from work around 21:30 and getting dinner. At approximately 22:30 pt reported "feeling funny". She could feel her heart beating quickly in her chest. Denies CP, SOB, HA, Syncope, lightheadedness, N/V/D w/ episode. Pt reports one other similar episode approximately 30 years ago. Pt called EMS and was transferred to [facility]. Pt found to be in A.fib upon arrival. Pt spontaneously converted back to normal sinus rhythm shortly after arriving in the ED. Pt denies any recent illnesses or change in medication, drug use, or h/o thyroid disease.  Review Of Systems: Per HPI  Patient Active Problem List  Diagnoses  . ONYCHOMYCOSIS, TOENAILS  . DIABETES MELLITUS II, UNCOMPLICATED  . HYPERCHOLESTEROLEMIA  . OBESITY, NOS  . ANEMIA, IRON DEFICIENCY, UNSPEC.  Marland Kitchen DEPRESSION, MAJOR, RECURRENT  . HYPERTENSION, BENIGN SYSTEMIC  . HEMORRHOIDS  . ASTHMA, INTERMITTENT  . GASTROESOPHAGEAL REFLUX, NO ESOPHAGITIS  . HERNIA, VENTRAL  . DIVERTICULOSIS, COLON  . POLYP, GALLBLADDER  . OSTEOARTHRITIS, MULTI SITES  . KNEE PAIN, RIGHT  . DEGENERATIVE DISC DISEASE, LUMBAR SPINE  . Hip arthritis  . Laryngitis acute   Past Medical History: Past Medical History  Diagnosis Date  . Diabetes mellitus   . Hypertension   . Hyperlipidemia   . Osteoarthritis     Past Surgical History: Hemorrhoidectomy Unknown benign tumor removal in 2003  Social History: History   Social History  . Marital Status: Divorced    Spouse Name: N/A    Number of Children: N/A  . Years of Education: N/A   Social History Main Topics  . Smoking  status: Former Games developer  . Smokeless tobacco: None  . Alcohol Use: No  . Drug Use: No  . Sexually Active: Not Currently     Family History: Father died of lung cancer and had a heart condition Mother w/ DM and had bilat LE amputations.   Allergies: Allergies  Allergen Reactions  . Flexeril (Cyclobenzaprine Hcl)     unknown  . Lipitor (Atorvastatin Calcium)     Myalgias  . Metaxalone     Current Facility-Administered Medications  Medication Dose Route Frequency Provider Last Rate Last Dose  . 0.9 %  sodium chloride infusion   Intravenous Once Vida Roller, MD 75 mL/hr at 12/09/11 0130    . aspirin chewable tablet 324 mg  324 mg Oral Once Vida Roller, MD   324 mg at 12/09/11 0135  . diltiazem (CARDIZEM) 100 mg in dextrose 5 % 100 mL infusion  5 mg/hr Intravenous Once Vida Roller, MD      . diltiazem (CARDIZEM) injection 10 mg  10 mg Intravenous Once Vida Roller, MD       Current Outpatient Prescriptions  Medication Sig Dispense Refill  . Cetirizine HCl 10 MG CAPS Take 10 mg by mouth daily as needed. For allergies       . guaiFENesin (MUCINEX) 600 MG 12 hr tablet Take 600 mg by mouth 2 (two) times daily. For congestion       . HYDROcodone-acetaminophen (NORCO) 5-325 MG per tablet Take 1 tablet by mouth every 6 (six) hours as needed. For pain       .  lisinopril-hydrochlorothiazide (PRINZIDE,ZESTORETIC) 20-25 MG per tablet TAKE 1 TABLET BY MOUTH DAILY.  30 tablet  3  . metformin (FORTAMET) 1000 MG (OSM) 24 hr tablet TAKE 1 TABLET BY MOUTH TWICE A DAY  60 tablet  2  . naproxen sodium (ANAPROX) 220 MG tablet Take 220 mg by mouth 2 (two) times daily with a meal. For knee pain       . polyethylene glycol (MIRALAX) powder Take 17 g by mouth as needed. Add 1 packet in a glass of water daily for constipation       . pravastatin (PRAVACHOL) 40 MG tablet Take 1 tablet (40 mg total) by mouth every evening.  30 tablet  2     Physical Exam: Filed Vitals:   12/09/11 0245  BP:  137/76  Pulse: 65  Temp: 97.7  Resp: 16   General: alert, cooperative and appears stated age HEENT: PERRLA, extra ocular movement intact, sclera clear, anicteric, oropharynx clear, no lesions, neck supple with midline trachea, thyroid without masses, trachea midline and Dentures Heart: S1, S2 normal, no murmur, rub or gallop, regular rate and rhythm Lungs: clear to auscultation, no wheezes or rales and unlabored breathing Abdomen: abdomen is soft without significant tenderness, masses, organomegaly or guarding Extremities: extremities normal, atraumatic, no cyanosis or edema Musculoskeletal: no joint tenderness, deformity or swelling Skin:no rashes, no ecchymoses, no petechiae, no nodules Neurology: normal without focal findings, mental status, speech normal, alert and oriented x3 and PERLA  Labs and Imaging: Lab Results  Component Value Date/Time   NA 143 12/09/2011 12:36 AM   K 3.7 12/09/2011 12:36 AM   CL 102 12/09/2011 12:36 AM   CO2 31 10/13/2011  1:25 AM   BUN 19 12/09/2011 12:36 AM   CREATININE 0.80 12/09/2011 12:36 AM   CREATININE 0.70 09/27/2011 11:57 AM   GLUCOSE 234* 12/09/2011 12:36 AM   Lab Results  Component Value Date   WBC 8.0 12/09/2011   HGB 12.9 12/09/2011   HCT 38.0 12/09/2011   MCV 76.1* 12/09/2011   PLT 187 12/09/2011   CXR: Findings suggestive of congestive failure with mild perihilar edema.  AXR: WNL  POC Troponi: Neg  INR: 1.02  EKG: A.Fib w/ RVR Repeat EKG: NSR. No ST changes    Assessment and Plan: Meredith [name] is a 60 y.o. year old female presenting with new onset and spontaneous resolution of A.Fib  1. A.Fib.: New onset. No immediate discernable source. BP and rate stable now. Asymptomatic. PT to follow-up w/ outpt PCP this week. Recommend repeat EKG if warranted at time of f/u appt.   2. Constipation: Pt endorsed h/o severe constipation requiring self disinpaction. On Miralax, but recommended pt discuss w/ PCP.   Signed: Shelly Flatten, M.D. Family Medicine Resident PGY-1 12/09/2011 4:05 AM

## 2011-12-13 ENCOUNTER — Other Ambulatory Visit: Payer: Self-pay

## 2011-12-13 ENCOUNTER — Ambulatory Visit (HOSPITAL_COMMUNITY)
Admission: RE | Admit: 2011-12-13 | Discharge: 2011-12-13 | Disposition: A | Discharge status: Home / Self Care | Payer: BC Managed Care – PPO | Source: Ambulatory Visit | Attending: Cardiovascular Disease | Admitting: Cardiovascular Disease

## 2011-12-13 ENCOUNTER — Ambulatory Visit
Admission: RE | Admit: 2011-12-13 | Discharge: 2011-12-13 | Disposition: A | Discharge status: Home / Self Care | Payer: BC Managed Care – PPO | Source: Ambulatory Visit | Attending: Family Medicine | Admitting: Family Medicine

## 2011-12-13 ENCOUNTER — Encounter: Payer: Self-pay | Admitting: Family Medicine

## 2011-12-13 ENCOUNTER — Ambulatory Visit (INDEPENDENT_AMBULATORY_CARE_PROVIDER_SITE_OTHER): Payer: BC Managed Care – PPO | Admitting: Family Medicine

## 2011-12-13 VITALS — BP 141/87 | HR 72 | Temp 98.6°F | Ht 63.0 in | Wt 257.1 lb

## 2011-12-13 DIAGNOSIS — E119 Type 2 diabetes mellitus without complications: Secondary | ICD-10-CM

## 2011-12-13 DIAGNOSIS — IMO0002 Reserved for concepts with insufficient information to code with codable children: Secondary | ICD-10-CM

## 2011-12-13 DIAGNOSIS — I4891 Unspecified atrial fibrillation: Secondary | ICD-10-CM | POA: Insufficient documentation

## 2011-12-13 DIAGNOSIS — M1990 Unspecified osteoarthritis, unspecified site: Secondary | ICD-10-CM

## 2011-12-13 LAB — POCT GLYCOSYLATED HEMOGLOBIN (HGB A1C): Hemoglobin A1C: 8.5

## 2011-12-13 MED ORDER — CLOPIDOGREL BISULFATE 75 MG PO TABS: 75.0000 mg | ORAL_TABLET | Freq: Every day | ORAL | Status: DC

## 2011-12-13 MED ORDER — METOPROLOL SUCCINATE ER 25 MG PO TB24: 25.0000 mg | ORAL_TABLET | Freq: Every day | ORAL | Status: DC

## 2011-12-13 MED ORDER — ASCRIPTIN 325 MG PO TABS: 325.0000 mg | ORAL_TABLET | Freq: Every day | ORAL | Status: DC

## 2011-12-13 NOTE — Assessment & Plan Note (Addendum)
EKG performed today demonstrates the patient is in sinus rhythm. I have a long discussion with the patient about risk of stroke and anticoagulation. She is very resolute and not being interested in Coumadin. She is open to trying the combination of aspirin plus Plavix. I do not have a good reason why she went into atrial fibrillation. I will obtain a TSH and free T4 as these were not obtained in the emergency department. I will also check a chest x-ray to assure that the infiltrates noted on chest x-ray in the emergency department were directly related to the atrial fibrillation. Finally, I will obtain an echo to check for any structural heart disease. At this point in time I do not feel that a referral to cardiology is required, although depending on what her workup reveals this may end up occurring.  I will also start patient on low dose metoprolol to help with rate control if she goes into afib abain.

## 2011-12-13 NOTE — Patient Instructions (Signed)
It was good to see you today! We need to get some blood work today to check out your thyroid. I want you to get a chest x-ray over at [facility]. We need to set up an echo of your heart.  Come back to see me 4-5 days after this is done. I want you to start a low dose of the medicine Metoprolol to try to prevent the rapid heart beat if you go back into the fibrillation. I would recommend trying some glucosamine and chondroitin for your knees.  These are things you can get from the drug store.

## 2011-12-13 NOTE — Progress Notes (Signed)
Subjective: Patient presents today for hospital followup. She was recently seen in the emergency department for new onset atrial fibrillation. The patient converted spontaneously while in the ER, and was not interested in admission at the time. She has not had any additional episodes of palpitations. She denies any chest pain, shortness of breath, visual changes, headaches, or presyncopal episodes. She reports that, at some point in the distant past, she had another episode of this and was placed on aspirin for it. She was initially tried on Coumadin but had significant bruising and would never consider going on this medication again.  The patient also continues to report bilateral knee pain. She has been seen by sports medicine for this. They have tried her on some hydrocodone which she reports only made her drowsy and make her pain any better. She's been treated in the past with me in injections, last one in November of 2012. She has been offered knee replacement in the past but is not interested in this.  Objective:  Filed Vitals:   12/13/11 1026  BP: 141/87  Pulse: 72  Temp: 98.6 F (37 C)   Gen: No acute distress CV: Regular rate and rhythm Resp: Clear to auscultation bilaterally Extremities: 2+ pulses, no edema  Assessment/Plan:  Please also see individual problems in problem list for problem-specific plans.  Greater than spent in visit with patient.  >50% spent in coordination of care and counciling.

## 2011-12-13 NOTE — Assessment & Plan Note (Signed)
Addressed, again, the options with the patient has. I have suggested trying glucosamine to see if this would help any with the patient's pain. The patient is not interested in joint replacement. I am not interested in prescribing narcotic pain medications for her at this time. She has been tried on these prescribed by another physician, and she has not seen much improvement.

## 2011-12-14 LAB — T4, FREE: Free T4: 1.17 ng/dL (ref 0.80–1.80)

## 2011-12-14 LAB — TSH: TSH: 1.762 u[IU]/mL (ref 0.350–4.500)

## 2011-12-16 ENCOUNTER — Encounter: Payer: Self-pay | Admitting: Family Medicine

## 2011-12-18 ENCOUNTER — Ambulatory Visit (HOSPITAL_COMMUNITY): Payer: BC Managed Care – PPO | Admitting: Radiology

## 2011-12-19 ENCOUNTER — Other Ambulatory Visit (HOSPITAL_COMMUNITY): Payer: Self-pay | Admitting: Family Medicine

## 2011-12-19 ENCOUNTER — Ambulatory Visit (HOSPITAL_COMMUNITY): Payer: BC Managed Care – PPO | Attending: Cardiology

## 2011-12-19 DIAGNOSIS — E119 Type 2 diabetes mellitus without complications: Secondary | ICD-10-CM | POA: Insufficient documentation

## 2011-12-19 DIAGNOSIS — IMO0002 Reserved for concepts with insufficient information to code with codable children: Secondary | ICD-10-CM

## 2011-12-19 DIAGNOSIS — I4891 Unspecified atrial fibrillation: Secondary | ICD-10-CM | POA: Insufficient documentation

## 2011-12-20 ENCOUNTER — Encounter: Payer: Self-pay | Admitting: Family Medicine

## 2012-01-06 ENCOUNTER — Encounter: Payer: Self-pay | Admitting: Family Medicine

## 2012-01-06 ENCOUNTER — Ambulatory Visit (INDEPENDENT_AMBULATORY_CARE_PROVIDER_SITE_OTHER): Payer: BC Managed Care – PPO | Admitting: Family Medicine

## 2012-01-06 DIAGNOSIS — I4891 Unspecified atrial fibrillation: Secondary | ICD-10-CM

## 2012-01-06 DIAGNOSIS — R109 Unspecified abdominal pain: Secondary | ICD-10-CM

## 2012-01-06 LAB — CBC WITH DIFFERENTIAL/PLATELET
Eosinophils Absolute: 0.1 10*3/uL (ref 0.0–0.7)
Eosinophils Relative: 2 % (ref 0–5)
HCT: 37 % (ref 36.0–46.0)
Lymphocytes Relative: 22 % (ref 12–46)
Lymphs Abs: 1.7 10*3/uL (ref 0.7–4.0)
MCH: 26.3 pg (ref 26.0–34.0)
MCV: 77.7 fL — ABNORMAL LOW (ref 78.0–100.0)
Monocytes Absolute: 0.5 10*3/uL (ref 0.1–1.0)
RBC: 4.76 MIL/uL (ref 3.87–5.11)
WBC: 7.7 10*3/uL (ref 4.0–10.5)

## 2012-01-06 LAB — LIPASE: Lipase: 33 U/L (ref 0–75)

## 2012-01-06 LAB — COMPREHENSIVE METABOLIC PANEL
BUN: 19 mg/dL (ref 6–23)
CO2: 30 mEq/L (ref 19–32)
Calcium: 9.9 mg/dL (ref 8.4–10.5)
Chloride: 103 mEq/L (ref 96–112)
Creat: 0.63 mg/dL (ref 0.50–1.10)
Glucose, Bld: 127 mg/dL — ABNORMAL HIGH (ref 70–99)

## 2012-01-06 MED ORDER — HYDROCODONE-ACETAMINOPHEN 5-325 MG PO TABS: 1.0000 | ORAL_TABLET | Freq: Four times a day (QID) | ORAL | Status: DC | PRN

## 2012-01-06 MED ORDER — ONDANSETRON HCL 8 MG PO TABS: 8.0000 mg | ORAL_TABLET | Freq: Three times a day (TID) | ORAL | Status: AC | PRN

## 2012-01-06 NOTE — Progress Notes (Signed)
Addended by: Macy Mis on: 01/06/2012 10:11 AM   Modules accepted: Level of Service

## 2012-01-06 NOTE — Patient Instructions (Signed)
Will get some labwork today Will use hydrocodone and ondansetron for nausea Seek medical care if you notice worsening pain, fever, blood in emesis or stools.

## 2012-01-06 NOTE — Assessment & Plan Note (Addendum)
Most likely self limited gastroenteritis, mid abdominal pain due to emesis.  Less likely, peptic ulcer due to using ASA 325 mg 2-3 times daily.  She has self- discontinued plavix.  Advised to decrease ASA to once daily.  Not likely related to ventral hernia or diverticulitis at this time.  Will draw CBC, CMET, Lipase.  Will rx vicodin and zofran.  Given red flags to return if worsens, other symptoms, or does not self resolve.

## 2012-01-06 NOTE — Assessment & Plan Note (Signed)
Patient not on coumadin or plavix due to self preference.  Has discussed this with PCP.  Advised she not take more than 325 mg of asa daily and have further discussion with PCP

## 2012-01-06 NOTE — Progress Notes (Addendum)
  Subjective:    Patient ID: Meredith [name], female    DOB: 1951/03/26, 61 y.o.   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  Intermittent, 8-10/10.  Described as throbbing in the entire lower section.  Emesis multiple times but states "nothign came up" until the 5th time.  No diarrhea or fever.   Feels fatigued.  Not sure if food makes a difference.  Has not had anything to drink.  Normal BM this morning.    Last year had similar pain, went to ED, she feels the cause was a hernia.  Self resolved in one day per patient.  I have reviewed patient's  PMH, FH, and Social history and Medications as related to this visit. No history of cholecystectomy.  Had hysterectomy.  Records review:  Had a colonoscopy last 05/2010- mild diverticula, few polyps.  CT abd 05-2010: enteritis, small ventral hernia with only fat 04/2010: neg small bowel follow through Review of Systems     Objective:   Physical Exam GEN: Alert & Oriented, No acute distress CV:  Regular Rate & Rhythm, no murmur Respiratory:  Normal work of breathing, CTAB Abd:  + BS, soft, mild to moderate tenderness in middle upper abdomen below epigastric area.  No rebound or guarding.        Assessment & Plan:

## 2012-02-03 ENCOUNTER — Other Ambulatory Visit: Payer: Self-pay | Admitting: Family Medicine

## 2012-02-03 NOTE — Telephone Encounter (Signed)
Refill request

## 2012-02-04 ENCOUNTER — Telehealth: Payer: Self-pay | Admitting: *Deleted

## 2012-02-04 NOTE — Telephone Encounter (Signed)
Consulted with Dr. Deirdre Priest He advises not to take hydrocodone for the pain . If pain gets that bad, needs to go to ED. Patient has not taken diabetic meds today. MD advised .

## 2012-02-04 NOTE — Telephone Encounter (Signed)
Patient calls stating last night she had abdominal pain  generalized over entire abdomen. She took a hydrocodone . Ate beef tips last night and later vomited  Vomited several times last night. Last time vomiting was 9:30 this AM. She has been able to tolerate water and has eaten broth and chicken noodle soup. Had a good BM today at 12:00 . She is at work now,  nausea is improved. Her main complaint now is bloating . Passing gas. She has taken a Zantac and that has helped some. Advised to push clear sugar free liquids. Appointment schedule tomorrow AM.

## 2012-02-05 ENCOUNTER — Encounter: Payer: Self-pay | Admitting: Family Medicine

## 2012-02-05 ENCOUNTER — Ambulatory Visit (INDEPENDENT_AMBULATORY_CARE_PROVIDER_SITE_OTHER): Payer: BC Managed Care – PPO | Admitting: Family Medicine

## 2012-02-05 DIAGNOSIS — R109 Unspecified abdominal pain: Secondary | ICD-10-CM

## 2012-02-05 NOTE — Assessment & Plan Note (Signed)
Abdominal pain x2 days if not resolved. She feels like she can eat today. She has been drinking well and is hydrated. Advised her to restart her medications and followup in a relatively short period with her PCP to make sure things are better. Patient did discuss a feeling of fullness but this has just been 2 or 3 days. If this continues, could consider further evaluation with GI.

## 2012-02-05 NOTE — Progress Notes (Signed)
  Subjective:    Patient ID: Meredith [name], female    DOB: 07/16/1951, 61 y.o.   MRN: [id number]  HPI  Patient here for evaluation of abdominal pain. 2 days ago she had generalized abdominal pain and vomited several times. The next day she had a burning sensation that was completely relieved by Zantac. Now she is having some bloating sensation as well as gas. She has absolutely no pain and is feeling much better. She had nausea for last 2 days but feels she is ready to eat today. She stopped all her medications while she was sick. She feels like she could take them again today. she denies diarrhea. She denies blood in her stool. She denies blood in her vomit. She felt well enough to go to her work yesterday. She has been drinking plenty of fluid. She has no changes in her urine or bowel movements.  Review of Systems See above    Objective:   Physical Exam  Vital signs reviewed General appearance - alert, well appearing, and in no distress and oriented to person, place, and time Abdomen - soft, nontender, nondistended, no masses or organomegaly Extremities - peripheral pulses normal, no pedal edema, no clubbing or cyanosis       Assessment & Plan:

## 2012-03-10 ENCOUNTER — Other Ambulatory Visit: Payer: Self-pay | Admitting: Family Medicine

## 2012-03-10 MED ORDER — LISINOPRIL-HYDROCHLOROTHIAZIDE 20-25 MG PO TABS: 1.0000 | ORAL_TABLET | Freq: Every day | ORAL | Status: DC

## 2012-03-17 ENCOUNTER — Other Ambulatory Visit: Payer: Self-pay | Admitting: Family Medicine

## 2012-03-17 MED ORDER — LISINOPRIL-HYDROCHLOROTHIAZIDE 20-25 MG PO TABS: 1.0000 | ORAL_TABLET | Freq: Every day | ORAL | Status: DC

## 2012-03-20 ENCOUNTER — Other Ambulatory Visit: Payer: Self-pay | Admitting: Family Medicine

## 2012-03-20 MED ORDER — PRAVASTATIN SODIUM 40 MG PO TABS: 40.0000 mg | ORAL_TABLET | Freq: Every evening | ORAL | Status: DC

## 2012-05-11 ENCOUNTER — Other Ambulatory Visit: Payer: Self-pay | Admitting: *Deleted

## 2012-05-11 MED ORDER — METFORMIN HCL ER (OSM) 1000 MG PO TB24: 2000.0000 mg | ORAL_TABLET | Freq: Every day | ORAL | Status: DC

## 2012-05-13 ENCOUNTER — Encounter: Payer: Self-pay | Admitting: Family Medicine

## 2012-05-13 ENCOUNTER — Ambulatory Visit (INDEPENDENT_AMBULATORY_CARE_PROVIDER_SITE_OTHER): Payer: BC Managed Care – PPO | Admitting: Family Medicine

## 2012-05-13 VITALS — BP 138/92 | HR 78 | Temp 97.7°F | Ht 63.0 in | Wt 248.0 lb

## 2012-05-13 DIAGNOSIS — M1990 Unspecified osteoarthritis, unspecified site: Secondary | ICD-10-CM

## 2012-05-13 DIAGNOSIS — I10 Essential (primary) hypertension: Secondary | ICD-10-CM

## 2012-05-13 DIAGNOSIS — E7800 Pure hypercholesterolemia, unspecified: Secondary | ICD-10-CM

## 2012-05-13 DIAGNOSIS — M25569 Pain in unspecified knee: Secondary | ICD-10-CM

## 2012-05-13 DIAGNOSIS — E119 Type 2 diabetes mellitus without complications: Secondary | ICD-10-CM

## 2012-05-13 LAB — LDL CHOLESTEROL, DIRECT: Direct LDL: 130 mg/dL — ABNORMAL HIGH

## 2012-05-13 LAB — BASIC METABOLIC PANEL
CO2: 30 mEq/L (ref 19–32)
Calcium: 10 mg/dL (ref 8.4–10.5)
Potassium: 3.7 mEq/L (ref 3.5–5.3)
Sodium: 143 mEq/L (ref 135–145)

## 2012-05-13 MED ORDER — CLOPIDOGREL BISULFATE 75 MG PO TABS: 75.0000 mg | ORAL_TABLET | Freq: Every day | ORAL | Status: DC

## 2012-05-13 MED ORDER — HYDROCODONE-ACETAMINOPHEN 5-325 MG PO TABS: 1.0000 | ORAL_TABLET | Freq: Four times a day (QID) | ORAL | Status: AC | PRN

## 2012-05-13 MED ORDER — METOPROLOL TARTRATE 25 MG PO TABS: 25.0000 mg | ORAL_TABLET | Freq: Two times a day (BID) | ORAL | Status: DC

## 2012-05-13 MED ORDER — LISINOPRIL-HYDROCHLOROTHIAZIDE 20-25 MG PO TABS: 1.0000 | ORAL_TABLET | Freq: Every day | ORAL | Status: DC

## 2012-05-13 NOTE — Assessment & Plan Note (Signed)
Patient has been using the same norco prescription since January.  Is now out.  Discussed the role of narcotics in pain for OA.  Agreed to provide refill for use when patient is particularly active and has a lot of problems with pain.  Patient does have intermittent knee injections which are helpful.  Not interested in any today.

## 2012-05-13 NOTE — Progress Notes (Signed)
Patient ID: Meredith [name], female   DOB: May 11, 1951, 61 y.o.   MRN: [id number] Subjective: The patient is a 61 y.o. year old female who presents today for followup.  1. Diabetes: She has been working very hard on dietary modification and is pleased to see that her A1c has decreased. She continues to take her metformin but no other medications. Her weight has not changed significantly. She has not had any problems with hypoglycemia.  2. Hypertension: Patient is not currently taking any beta blocker despite being prescribed. She reports it was too expensive. Denies headaches, chest pain, shortness of breath, lower tremor any edema, or visual changes.  3. Hyperlipidemia: Patient continues to take pravastatin and is tolerating it okay. Last cholesterol check was over a year ago. She has had problems with multiple statin medications. Denies myalgias or weakness.  Patient's past medical, social, and family history were reviewed and updated as appropriate. History  Substance Use Topics  . Smoking status: Former Games developer  . Smokeless tobacco: Not on file  . Alcohol Use: No   Objective:  Filed Vitals:   05/13/12 0846  BP: 138/92  Pulse: 78  Temp: 97.7 F (36.5 C)   Gen: No acute distress, morbidly obese CV: Regular rate and rhythm Resp: Clear bilaterally Ext: Diabetic foot exam performed. No ulcers, good sensation  Assessment/Plan:  Please also see individual problems in problem list for problem-specific plans.

## 2012-05-13 NOTE — Assessment & Plan Note (Signed)
The patient's A1c is significantly improved today through dietary modification. I would ideally like to start her on some glipizide, however she is resistant to the idea of starting new medications and I feel that her blood pressure control is probably more important than the slightly lower blood sugar.

## 2012-05-13 NOTE — Assessment & Plan Note (Signed)
Patient not taking metoprolol.  Will restart and recheck bp in 2-3 weeks.

## 2012-05-13 NOTE — Assessment & Plan Note (Signed)
Has had a lot of problems with statin intolerance so we may not have a lot of great options.  Has not been checked in a year, however, so will recheck today.

## 2012-05-13 NOTE — Patient Instructions (Signed)
It was great to see you today! I want you to start taking metoprolol for your blood pressure.  You will be taking one pill in the morning and one pill at night with your lisinopril. We are checking your cholesterol.  Depending on the results I may be calling you to change medication. Come back in 3 weeks for [place] to check your blood pressure

## 2012-05-17 ENCOUNTER — Emergency Department (HOSPITAL_COMMUNITY): Payer: BC Managed Care – PPO

## 2012-05-17 ENCOUNTER — Emergency Department (HOSPITAL_COMMUNITY)
Admission: EM | Admit: 2012-05-17 | Discharge: 2012-05-17 | Disposition: A | Discharge status: Home / Self Care | Payer: BC Managed Care – PPO | Attending: Emergency Medicine | Admitting: Emergency Medicine

## 2012-05-17 ENCOUNTER — Encounter (HOSPITAL_COMMUNITY): Payer: Self-pay | Admitting: *Deleted

## 2012-05-17 DIAGNOSIS — R112 Nausea with vomiting, unspecified: Secondary | ICD-10-CM

## 2012-05-17 DIAGNOSIS — Z7982 Long term (current) use of aspirin: Secondary | ICD-10-CM | POA: Insufficient documentation

## 2012-05-17 DIAGNOSIS — K5790 Diverticulosis of intestine, part unspecified, without perforation or abscess without bleeding: Secondary | ICD-10-CM

## 2012-05-17 DIAGNOSIS — E119 Type 2 diabetes mellitus without complications: Secondary | ICD-10-CM | POA: Insufficient documentation

## 2012-05-17 DIAGNOSIS — K529 Noninfective gastroenteritis and colitis, unspecified: Secondary | ICD-10-CM

## 2012-05-17 DIAGNOSIS — E669 Obesity, unspecified: Secondary | ICD-10-CM | POA: Insufficient documentation

## 2012-05-17 DIAGNOSIS — N281 Cyst of kidney, acquired: Secondary | ICD-10-CM

## 2012-05-17 DIAGNOSIS — I10 Essential (primary) hypertension: Secondary | ICD-10-CM | POA: Insufficient documentation

## 2012-05-17 DIAGNOSIS — E785 Hyperlipidemia, unspecified: Secondary | ICD-10-CM | POA: Insufficient documentation

## 2012-05-17 DIAGNOSIS — R109 Unspecified abdominal pain: Secondary | ICD-10-CM

## 2012-05-17 DIAGNOSIS — K5289 Other specified noninfective gastroenteritis and colitis: Secondary | ICD-10-CM | POA: Insufficient documentation

## 2012-05-17 HISTORY — DX: Diverticulosis of intestine, part unspecified, without perforation or abscess without bleeding: K57.90

## 2012-05-17 HISTORY — DX: Cyst of kidney, acquired: N28.1

## 2012-05-17 LAB — DIFFERENTIAL
Eosinophils Relative: 1 % (ref 0–5)
Lymphocytes Relative: 15 % (ref 12–46)
Lymphs Abs: 1.4 10*3/uL (ref 0.7–4.0)
Monocytes Absolute: 0.4 10*3/uL (ref 0.1–1.0)
Monocytes Relative: 4 % (ref 3–12)

## 2012-05-17 LAB — COMPREHENSIVE METABOLIC PANEL
ALT: 11 U/L (ref 0–35)
BUN: 15 mg/dL (ref 6–23)
CO2: 30 mEq/L (ref 19–32)
Calcium: 10.1 mg/dL (ref 8.4–10.5)
Creatinine, Ser: 0.61 mg/dL (ref 0.50–1.10)
GFR calc Af Amer: >90 mL/min (ref >90)
GFR calc non Af Amer: >90 mL/min (ref >90)
Glucose, Bld: 186 mg/dL — ABNORMAL HIGH (ref 70–99)

## 2012-05-17 LAB — CBC
HCT: 38.6 % (ref 36.0–46.0)
MCH: 26.2 pg (ref 26.0–34.0)
MCV: 75.5 fL — ABNORMAL LOW (ref 78.0–100.0)
RBC: 5.11 MIL/uL (ref 3.87–5.11)
WBC: 9.2 10*3/uL (ref 4.0–10.5)

## 2012-05-17 LAB — URINALYSIS, ROUTINE W REFLEX MICROSCOPIC
Hgb urine dipstick: NEGATIVE
Protein, ur: NEGATIVE mg/dL
Urobilinogen, UA: 1 mg/dL (ref 0.0–1.0)

## 2012-05-17 LAB — URINE MICROSCOPIC-ADD ON

## 2012-05-17 LAB — LIPASE, BLOOD: Lipase: 29 U/L (ref 11–59)

## 2012-05-17 MED ORDER — OXYCODONE-ACETAMINOPHEN 5-325 MG PO TABS: 2.0000 | ORAL_TABLET | ORAL | Status: AC | PRN

## 2012-05-17 MED ORDER — METRONIDAZOLE 500 MG PO TABS: 500.0000 mg | ORAL_TABLET | Freq: Two times a day (BID) | ORAL | Status: AC

## 2012-05-17 MED ORDER — METOCLOPRAMIDE HCL 10 MG PO TABS: 10.0000 mg | ORAL_TABLET | Freq: Four times a day (QID) | ORAL | Status: DC | PRN

## 2012-05-17 MED ORDER — CIPROFLOXACIN HCL 500 MG PO TABS: 500.0000 mg | ORAL_TABLET | Freq: Two times a day (BID) | ORAL | Status: AC

## 2012-05-17 MED ORDER — SODIUM CHLORIDE 0.9 % IV BOLUS (SEPSIS)
1000.0000 mL | Freq: Once | INTRAVENOUS | Status: AC
  Administered 2012-05-17: 1000 mL via INTRAVENOUS

## 2012-05-17 MED ORDER — IOHEXOL 300 MG/ML  SOLN
100.0000 mL | Freq: Once | INTRAMUSCULAR | Status: AC | PRN
  Administered 2012-05-17: 100 mL via INTRAVENOUS

## 2012-05-17 MED ORDER — ONDANSETRON 8 MG PO TBDP: ORAL_TABLET | ORAL | Status: AC

## 2012-05-17 MED ORDER — IOHEXOL 300 MG/ML  SOLN
20.0000 mL | INTRAMUSCULAR | Status: AC
  Administered 2012-05-17: 20 mL via ORAL

## 2012-05-17 MED ORDER — ONDANSETRON HCL 4 MG/2ML IJ SOLN
4.0000 mg | Freq: Once | INTRAMUSCULAR | Status: AC
  Administered 2012-05-17: 4 mg via INTRAVENOUS
  Filled 2012-05-17: qty 2

## 2012-05-17 MED ORDER — MORPHINE SULFATE 4 MG/ML IJ SOLN: 4.0000 mg | Freq: Once | INTRAMUSCULAR | Status: DC

## 2012-05-17 MED ORDER — MORPHINE SULFATE 4 MG/ML IJ SOLN
4.0000 mg | Freq: Once | INTRAMUSCULAR | Status: AC
  Administered 2012-05-17: 4 mg via INTRAVENOUS
  Filled 2012-05-17: qty 1

## 2012-05-17 MED ORDER — SODIUM CHLORIDE 0.9 % IV SOLN
1000.0000 mL | Freq: Once | INTRAVENOUS | Status: AC
  Administered 2012-05-17: 1000 mL via INTRAVENOUS

## 2012-05-17 NOTE — Discharge Instructions (Signed)

## 2012-05-17 NOTE — ED Notes (Signed)
Asked pt for urine sample. Pt states she tried to urinate but can't at this present moment.

## 2012-05-17 NOTE — ED Notes (Signed)
Pt states that she ate 2 hotdogs a day ago and started having nausea and vomiting. Pt states that her abdomen is hurting. Pt states that she has been unable to eat or drink for the past 24 hours.

## 2012-05-17 NOTE — ED Notes (Signed)
Pt reports "feeling better" at this time.

## 2012-05-17 NOTE — ED Provider Notes (Signed)
CT scan results today reviewed with patient and similar to prior November 2011 CT, also reviewed clinic results from both the patient's primary care as well as GI Dr. Patient had a CT scan today with results and episode of discomfort similar to November 2011. She has nonspecific enteritis and now is minimal pain with no tenderness on repeat examination suddenly she is stable for discharge home with outpatient followup the patient understands and agrees with this assessment and plan as well.[id number]  Hurman Horn, MD 05/17/12 [phone]

## 2012-05-17 NOTE — ED Provider Notes (Signed)
History     CSN: [id number]  Arrival date & time 05/17/12  0127   First MD Initiated Contact with Patient 05/17/12 0400      Chief Complaint  Patient presents with  . Nausea  . Emesis    (Consider location/radiation/quality/duration/timing/severity/associated sxs/prior treatment) HPI 61 year old female presents to emergency room complaining of diffuse abdominal pain. Patient reports she had been feeling unwell for the last 2 days, then had some hot dogs one day ago and began to have nausea and vomiting. Patient has been able to eat anything and keep it down. She's been having regular bowel movements. She denies any urinary symptoms. She has had no fevers. No prior history of similar symptoms. No sick contacts. Patient did not think the hot dogs were spoiled. Patient with history of total hysterectomy, no prior history of bowel obstruction. Pain is sharp constant occasionally crampy. She's had no blood in her stool or emesis.  Past Medical History  Diagnosis Date  . Diabetes mellitus   . Hypertension   . Hyperlipidemia   . Osteoarthritis   . ANEMIA, IRON DEFICIENCY, UNSPEC. 02/05/2007    Qualifier: Diagnosis of  By: Para March MD, Cheree Ditto      Past Surgical History  Procedure Date  . Tubal ligation   . Uterine fibroid surgery   . Operative hysteroscopy     History reviewed. No pertinent family history.  History  Substance Use Topics  . Smoking status: Former Games developer  . Smokeless tobacco: Not on file  . Alcohol Use: No    OB History    Grav Para Term Preterm Abortions TAB SAB Ect Mult Living                  Review of Systems  All other systems reviewed and are negative.    Allergies  Flexeril; Lipitor; and Metaxalone  Home Medications   Current Outpatient Rx  Name Route Sig Dispense Refill  . ASPIRIN 81 MG PO CHEW Oral Chew 81 mg by mouth daily.    Marland Kitchen CLOPIDOGREL BISULFATE 75 MG PO TABS Oral Take 75 mg by mouth daily.    Marland Kitchen HYDROCODONE-ACETAMINOPHEN 5-325 MG PO  TABS Oral Take 1 tablet by mouth every 6 (six) hours as needed for pain. 30 tablet 0  . LISINOPRIL-HYDROCHLOROTHIAZIDE 20-25 MG PO TABS Oral Take 1 tablet by mouth daily. 30 tablet 3  . METFORMIN HCL ER (OSM) 1000 MG PO TB24 Oral Take 1,000 mg by mouth 2 (two) times daily with a meal.    . POLYETHYLENE GLYCOL 3350 PO POWD Oral Take 17 g by mouth as needed. Add 1 packet in a glass of water daily for constipation     . PRAVASTATIN SODIUM 40 MG PO TABS Oral Take 40 mg by mouth every evening.    Marland Kitchen METOPROLOL TARTRATE 25 MG PO TABS Oral Take 1 tablet (25 mg total) by mouth 2 (two) times daily. 180 tablet 3    BP 130/63  Pulse 74  Temp(Src) 98.2 F (36.8 C) (Oral)  Resp 18  SpO2 98%  Physical Exam  Nursing note and vitals reviewed. Constitutional: She appears well-developed and well-nourished. No distress.       Obese female no acute distress  HENT:  Head: Normocephalic and atraumatic.  Eyes: Conjunctivae and EOM are normal. Pupils are equal, round, and reactive to light.  Neck: Normal range of motion. Neck supple. No JVD present. No tracheal deviation present. No thyromegaly present.  Cardiovascular: Normal rate, regular rhythm, normal heart sounds  and intact distal pulses.  Exam reveals no gallop and no friction rub.   No murmur heard. Pulmonary/Chest: Effort normal and breath sounds normal. No stridor. No respiratory distress. She has no wheezes. She has no rales. She exhibits no tenderness.  Abdominal: Soft. She exhibits no distension and no mass. There is tenderness. There is no rebound and no guarding.       Decreased overall bowel sounds. Patient with diffuse tenderness slightly worse in lower quadrants without rebound or guarding  Musculoskeletal: Normal range of motion. She exhibits edema. She exhibits no tenderness.  Lymphadenopathy:    She has no cervical adenopathy.  Skin: Skin is warm and dry. No rash noted. No erythema. No pallor.    ED Course  Procedures (including  critical care time)  Labs Reviewed  CBC - Abnormal; Notable for the following:    MCV 75.5 (*)    All other components within normal limits  DIFFERENTIAL - Abnormal; Notable for the following:    Neutrophils Relative 80 (*)    All other components within normal limits  COMPREHENSIVE METABOLIC PANEL - Abnormal; Notable for the following:    Potassium 3.2 (*)    Glucose, Bld 186 (*)    All other components within normal limits  URINALYSIS, ROUTINE W REFLEX MICROSCOPIC - Abnormal; Notable for the following:    Leukocytes, UA TRACE (*)    All other components within normal limits  LIPASE, BLOOD  URINE MICROSCOPIC-ADD ON   No results found.   1. Abdominal pain   2. Nausea and vomiting       MDM  61 year old female with abdominal pain nausea and vomiting. Patient with diabetes hypertension and hyperlipidemia. Her cup shows normal UA aside from trace leukocyte esterase. Patient with some improvement after pain and nausea medicine but still with some lower abdominal tenderness. To have CT abdomen pelvis performed for further evaluation. Patient removed over to the CDU while finishing her contrast.        Olivia Mackie, MD 05/17/12 [phone]

## 2012-05-17 NOTE — ED Notes (Signed)
Pt told EDP reviewing CT, will notify pt of next step.

## 2012-05-17 NOTE — ED Notes (Signed)
PT ambulated to restroom with no difficulty.

## 2012-05-18 ENCOUNTER — Telehealth: Payer: Self-pay | Admitting: Family Medicine

## 2012-05-18 NOTE — Telephone Encounter (Signed)
Went to ED yesterday and was told to stop taking metformin for 48 hours and get in touch with her PCP.  She is not sure what to do now.

## 2012-05-18 NOTE — Telephone Encounter (Signed)
Spoke with patient and informed her of below 

## 2012-05-18 NOTE — Telephone Encounter (Signed)
Patient is to stop metformin for 2 full days after ER visit.  After that, she can resume the medication.  She should get in to see [place] in the next several days if she is not feeling better.

## 2012-05-20 ENCOUNTER — Other Ambulatory Visit: Payer: Self-pay | Admitting: Family Medicine

## 2012-05-20 DIAGNOSIS — Z1231 Encounter for screening mammogram for malignant neoplasm of breast: Secondary | ICD-10-CM

## 2012-05-21 ENCOUNTER — Ambulatory Visit
Admission: RE | Admit: 2012-05-21 | Discharge: 2012-05-21 | Disposition: A | Discharge status: Home / Self Care | Payer: BC Managed Care – PPO | Source: Ambulatory Visit | Attending: Family Medicine | Admitting: Family Medicine

## 2012-05-21 ENCOUNTER — Telehealth: Payer: Self-pay | Admitting: Family Medicine

## 2012-05-21 DIAGNOSIS — Z1231 Encounter for screening mammogram for malignant neoplasm of breast: Secondary | ICD-10-CM

## 2012-05-21 MED ORDER — ROSUVASTATIN CALCIUM 40 MG PO TABS: 40.0000 mg | ORAL_TABLET | Freq: Every day | ORAL | Status: DC

## 2012-05-21 NOTE — Telephone Encounter (Signed)
Called patient and discussed changing from pravastatin to crestor due to continued elevation of LDL and history of intolerance of lipitor.

## 2012-06-02 ENCOUNTER — Encounter (INDEPENDENT_AMBULATORY_CARE_PROVIDER_SITE_OTHER): Payer: BC Managed Care – PPO | Admitting: Family Medicine

## 2012-06-02 ENCOUNTER — Telehealth: Payer: Self-pay | Admitting: *Deleted

## 2012-06-02 ENCOUNTER — Encounter: Payer: Self-pay | Admitting: Family Medicine

## 2012-06-02 NOTE — Telephone Encounter (Signed)
Patient in today for appointment but rescheduled due to MD being sick. She does however have a question about whether or not she is supposed to be taking plavix. She say its was refilled at her appointment on 6/5 but in that same office note it says this med was discontinued. Will forward to PCP for clarification.

## 2012-06-03 NOTE — Progress Notes (Signed)
This encounter was created in error - please disregard.

## 2012-06-03 NOTE — Telephone Encounter (Signed)
I would prefer for her to take it.  At our last office visit she told me she was not taking it due to concerns about expense.  I can't remember exactly how it was that I ended up refilling it bu tit would be best for her to take it.

## 2012-06-05 NOTE — Telephone Encounter (Signed)
Left message on patient voicemail to call back regarding meds.

## 2012-06-08 NOTE — Telephone Encounter (Signed)
Spoke with patient, she states that new blood pressure med that was added makes her sick and she will no longer take it. Will discuss meds with PCP at visit next week.

## 2012-06-17 ENCOUNTER — Ambulatory Visit: Payer: BC Managed Care – PPO | Admitting: Family Medicine

## 2012-07-13 ENCOUNTER — Other Ambulatory Visit: Payer: Self-pay | Admitting: Family Medicine

## 2012-07-21 ENCOUNTER — Other Ambulatory Visit: Payer: Self-pay | Admitting: *Deleted

## 2012-07-22 MED ORDER — METFORMIN HCL ER (OSM) 1000 MG PO TB24: 1000.0000 mg | ORAL_TABLET | Freq: Two times a day (BID) | ORAL | Status: DC

## 2012-09-11 ENCOUNTER — Ambulatory Visit (INDEPENDENT_AMBULATORY_CARE_PROVIDER_SITE_OTHER): Payer: BC Managed Care – PPO | Admitting: Family Medicine

## 2012-09-11 ENCOUNTER — Encounter: Payer: Self-pay | Admitting: Family Medicine

## 2012-09-11 VITALS — BP 140/80 | HR 70 | Temp 98.8°F | Wt 252.0 lb

## 2012-09-11 DIAGNOSIS — E7800 Pure hypercholesterolemia, unspecified: Secondary | ICD-10-CM

## 2012-09-11 DIAGNOSIS — I4891 Unspecified atrial fibrillation: Secondary | ICD-10-CM

## 2012-09-11 DIAGNOSIS — E119 Type 2 diabetes mellitus without complications: Secondary | ICD-10-CM

## 2012-09-11 DIAGNOSIS — I10 Essential (primary) hypertension: Secondary | ICD-10-CM

## 2012-09-11 DIAGNOSIS — M79676 Pain in unspecified toe(s): Secondary | ICD-10-CM

## 2012-09-11 DIAGNOSIS — M79609 Pain in unspecified limb: Secondary | ICD-10-CM

## 2012-09-11 MED ORDER — PRAVASTATIN SODIUM 80 MG PO TABS: 80.0000 mg | ORAL_TABLET | Freq: Every day | ORAL | Status: DC

## 2012-09-11 MED ORDER — LISINOPRIL-HYDROCHLOROTHIAZIDE 20-12.5 MG PO TABS: 2.0000 | ORAL_TABLET | Freq: Every day | ORAL | Status: DC

## 2012-09-11 MED ORDER — METFORMIN HCL ER (OSM) 1000 MG PO TB24: 1000.0000 mg | ORAL_TABLET | Freq: Two times a day (BID) | ORAL | Status: DC

## 2012-09-11 MED ORDER — TRAMADOL HCL 50 MG PO TABS: 50.0000 mg | ORAL_TABLET | Freq: Three times a day (TID) | ORAL | Status: DC | PRN

## 2012-09-11 MED ORDER — METFORMIN HCL ER (OSM) 1000 MG PO TB24: 2000.0000 mg | ORAL_TABLET | Freq: Every day | ORAL | Status: DC

## 2012-09-11 NOTE — Patient Instructions (Addendum)
1) Take the rest of your crestor.  Once you are done with that we will switch back to pravastatin since you can't afford refills on the crestor. 2) I am sending in a new prescription for your lisinopril/hydroclorothiazide.  It will be 20-12.5mg .  Please take 2 of these every day once you get done with your current pills. 3) I would recommend doing a lot of soaking your foot.  At least once per day use some dental floss to lift up the side of your nail.  If it starts getting really swollen or draining please come back in to see [place]. 4) i want you to sign a release of information paper for [place] to get in touch with the cardiologist you have seen. 5) Plan on coming back to see me in 4-6 weeks.

## 2012-09-11 NOTE — Assessment & Plan Note (Signed)
Patient says she cannot afford crestor.  Has not tolerated lipitor in the past.  Will switch back to pravastatin after discussion that this would not lower her LDL enough to reach goal.

## 2012-09-11 NOTE — Assessment & Plan Note (Signed)
Will try to obtain records from cardiologist so I can reference them in further discussions.  Recommended that patient stay on plavix and metoprolol but she refuses at this time because she says her heart if "fine."

## 2012-09-11 NOTE — Assessment & Plan Note (Signed)
Not at goal.  Will increase to lisinopril 40 with hctz 25 and recheck in several weeks.

## 2012-09-11 NOTE — Progress Notes (Signed)
Patient ID: Meredith [name], female   DOB: Sep 09, 1951, 61 y.o.   MRN: [id number] Subjective: The patient is a 61 y.o. year old female who presents today for f/u.  1. DM:  Does not routinely check blood sugar.  No diaphoresis, no lows.  No diarrhea/nausea.  2. HTN: BP has been elevated at home (up to 200/100 on home bp monitor).  No palpitations, no sob, no DOE, no cp.  3. Toe pain: Trying to cut right great toe nail about 1 month ago.  Had large chunk chip out.  Pain since then.  No fevers/chills, no drainage.  4. A-fib: Previous diagnosis.  Patient not taking metoprolol or plavix and has previously refused coumadin.  She says she was seen by cardiology and that her heart was "fine."  No palpitations.  Patient's past medical, social, and family history were reviewed and updated as appropriate. History  Substance Use Topics  . Smoking status: Former Games developer  . Smokeless tobacco: Not on file  . Alcohol Use: No   Objective:  Filed Vitals:   09/11/12 0844  BP: 140/80  Pulse: 70  Temp: 98.8 F (37.1 C)   Gen: Morbidly obese, no distress CV: RRR, no murmurs Resp: CTABL Ext: Right great toe has area of missing nail on the lateral side.  There is some tendency to be ingrown although the full nail is visible with palpation.  No swelling.  No drainage.  There is pain on palpation.  Assessment/Plan: Recommended soaking toe nail and lifting with dental floss on a daily basis to avoid fully ingrown toenail.  No indication for nail removal at this time (no evidence of infection, nail is not truly ingrown at this time) Please also see individual problems in problem list for problem-specific plans.

## 2012-09-29 ENCOUNTER — Other Ambulatory Visit: Payer: Self-pay | Admitting: Family Medicine

## 2012-09-29 ENCOUNTER — Telehealth: Payer: Self-pay | Admitting: Family Medicine

## 2012-09-29 DIAGNOSIS — E119 Type 2 diabetes mellitus without complications: Secondary | ICD-10-CM

## 2012-09-29 DIAGNOSIS — B351 Tinea unguium: Secondary | ICD-10-CM

## 2012-09-29 NOTE — Telephone Encounter (Signed)
She has tramadol at home.  She can try taking two of the tablets at a time instead of one.  If she thinks her toe is now infected, she can come in for an appointment so we can decide if she needs antibiotics before seeing the podiatrist.  She would require an appointment anyway before we could consider any stronger pain medications.

## 2012-09-29 NOTE — Telephone Encounter (Signed)
LVM for patient to call back to inform her of Podiatry appointment set up. [facility] [address] to see dr Joeseph Amor @ 1:30pm Phone number there is [phone]. She is to contact them if needing to change or cancel her appointment. She needs to bring insurance card and list of medication with her to this visit. Needs to be there 15 min before her appointment.

## 2012-09-29 NOTE — Telephone Encounter (Signed)
Patient is requesting pain medication.

## 2012-09-29 NOTE — Telephone Encounter (Signed)
Patient is calling because she tried to remove an ingrown toenail herself, since her appt on 10/4, and now is in pain from her self care.  She said that she asked the doctor at that time to cut her nails and she also asked for a referral to a foot specialist.  During the phone call however, she was very confused and said things that contradicted other things that she said.  All in all, she wants pain medication and a referral/recommendation for a Foot Specialist.

## 2012-09-30 ENCOUNTER — Other Ambulatory Visit: Payer: Self-pay | Admitting: *Deleted

## 2012-09-30 NOTE — Telephone Encounter (Signed)
No. Controlled substance refills are only processed in office visits.

## 2012-09-30 NOTE — Telephone Encounter (Signed)
[facility] calling to check on the status of refill request for Norco 5/325mg  # 30---last refilled on 05/13/12 by Dr. Louanne Belton.  Will route request to Dr. Louanne Belton and call pharmacy back.  Gaylene Brooks, RN

## 2012-10-01 NOTE — Telephone Encounter (Signed)
Called to inform pt that Dr. Louanne Belton states that she call take 2 tramadol at once for her pain. Pt stated that she has told Dr. Louanne Belton that the tramadol does not work and that it also raises her BP. I asked her was the pain coming from her toe or her knees? Pt stated that her knees are hurting her more and the fact that she does a lot of walking while she is at work it causes her toe to ache. I also asked her if her toe is swelling or believes that it may be infected she stated no. Pt stated that Dr. Louanne Belton has given her Norco in the past and it worked well with keeping her pain at bay.  I looked back in her chart to see when was the last time that this was given to her it was 6.5.2013 (see OV note). I told pt that I would forward this message to Dr. Louanne Belton for follow up. I suggested to her that she could try extra strength tylenol or tylenol arthritis to help with her pain. She stated that they would not sell her this in the store. I Could not understand why she was unable to obtain this.    OSTEOARTHRITIS, MULTI SITES - Meredith Homer, MD 05/13/2012 11:48 AM Signed  Patient has been using the same norco prescription since January. Is now out. Discussed the role of narcotics in pain for OA. Agreed to provide refill for use when patient is particularly active and has a lot of problems with pain. Patient does have intermittent knee injections which are helpful. Not interested in any today.

## 2012-10-02 NOTE — Telephone Encounter (Signed)
Spoke with Hospital [facility] at [facility].  informed refill request denied and patient will need office visit per Dr. Louanne Belton.  Senaida Ores, Maryjean Ka, RN

## 2012-10-02 NOTE — Telephone Encounter (Signed)
As I have been explaining to all my patients on narcotics for the last year, refills will only be provided in office visits.  If the norco is for toe pain not responsive to tramadol then we need to get her somewhere to have the nail removed (if podiatrist won't do it, we can do it in office here) and if the pain is getting progressively worse she needs to come in to be seen.  If the pain is for her knees, then she needs an office visit for a norco refill as per our office's general policy.  Bottom line, if she wants a refill on norco, it needs to be done in an office visit.

## 2012-10-09 ENCOUNTER — Other Ambulatory Visit: Payer: Self-pay | Admitting: *Deleted

## 2012-10-09 MED ORDER — HYDROCODONE-ACETAMINOPHEN 5-325 MG PO TABS: 1.0000 | ORAL_TABLET | Freq: Four times a day (QID) | ORAL | Status: DC | PRN

## 2012-11-20 ENCOUNTER — Ambulatory Visit (INDEPENDENT_AMBULATORY_CARE_PROVIDER_SITE_OTHER): Payer: BC Managed Care – PPO | Admitting: Family Medicine

## 2012-11-20 ENCOUNTER — Encounter: Payer: Self-pay | Admitting: Family Medicine

## 2012-11-20 VITALS — BP 199/104 | HR 74 | Ht 63.0 in | Wt 257.0 lb

## 2012-11-20 DIAGNOSIS — M1990 Unspecified osteoarthritis, unspecified site: Secondary | ICD-10-CM

## 2012-11-20 DIAGNOSIS — I10 Essential (primary) hypertension: Secondary | ICD-10-CM

## 2012-11-20 MED ORDER — TRAMADOL HCL 50 MG PO TABS: 100.0000 mg | ORAL_TABLET | Freq: Three times a day (TID) | ORAL | Status: DC | PRN

## 2012-11-20 NOTE — Patient Instructions (Addendum)
I am sorry your knees are hurting.  Your blood pressure today was BP: 199/104 mmHg.  Remember your goal blood pressure is about 130/80.  Please be sure to take your medication every day.    For your knees, please start taking tylenol (acetominophen) two extra strength tablets three times a day.  You can take 1-2 tramadol pills up to three times a day as needed for the pain.  I also recommend icing your knees for 20 minutes, especially after you are on your feet for a while.    The pain medicines that are not safe to take because of your blood pressure include: meloxicam, aleve, ibuprofen, advil, motrin, goody's powder.

## 2012-11-20 NOTE — Progress Notes (Signed)
  Subjective:    Patient ID: Meredith [name], female    DOB: 23-May-1951, 61 y.o.   MRN: [id number]  HPI  Meredith [name] comes in complaining of bilateral knee pain.  She has arthritis in her knees, and has seen Orthopedics in the past.  She has had injections in her knees but they do not help more than a week or so.  She says her knees ache at night time.  She complains of pain with going up stairs.  No falls or new injuries.  She rates the pain as 7/10 to 10/10 at times.    She has high blood pressure, and her BP was running high when she went to see Orthopedics for her knee pain, so they told her to stop taking meloxicam.  She was not sure if she was allowed to take tramadol, so she is not using it.  However, she has been taking bote aleve and ibuprofen, she did not realize that they were similar medications to meloxicam.   The patient denies chest pain, dizzyiness, LE edema, palpitations.  She normally takes her lisinopril/hctz, but did not take it this morning because she never takes her medications before going to the doctor.  She has metoprolol on her list but has never taken it.   I have reviewed the patient's medical history in detail and updated the computerized patient record.  Review of Systems Pertinent items in HPI    Objective:   Physical Exam BP 199/104  Pulse 74  Ht 5\' 3"  (1.6 m)  Wt 257 lb (116.574 kg)  BMI 45.53 kg/m2 General appearance: alert, cooperative and no distress Neck: no carotid bruit, no JVD and supple, symmetrical, trachea midline Lungs: clear to auscultation bilaterally Heart: regular rate and rhythm, S1, S2 normal, no murmur, click, rub or gallop Knees: Exam limited by obesity, patient does not appear to have effusion in either knee.  She has bilateral crepitus, some limited ROM in flexion (to 70 degrees), normal extension.  Ligamentous structures in tact, no instability.        Assessment & Plan:

## 2012-11-20 NOTE — Assessment & Plan Note (Signed)
Advised that patient needs another BP medication, could try something different if she does not like metoprolol.  However, patient refuses medication, refuses lab draw to check creatinine. She agrees to come back next week for a nurse visit for BP check.

## 2012-11-20 NOTE — Assessment & Plan Note (Signed)
Causing knee pain, pt declines cortisone injections.  Reviewed not to take any NSAIDS due to BP, advised to schedule tylenol and use tramadol PRN.

## 2012-11-28 ENCOUNTER — Emergency Department (HOSPITAL_COMMUNITY): Payer: BC Managed Care – PPO

## 2012-11-28 ENCOUNTER — Encounter (HOSPITAL_COMMUNITY): Payer: Self-pay | Admitting: Emergency Medicine

## 2012-11-28 ENCOUNTER — Emergency Department (HOSPITAL_COMMUNITY)
Admission: EM | Admit: 2012-11-28 | Discharge: 2012-11-28 | Disposition: A | Discharge status: Home / Self Care | Payer: BC Managed Care – PPO | Attending: Emergency Medicine | Admitting: Emergency Medicine

## 2012-11-28 DIAGNOSIS — R109 Unspecified abdominal pain: Secondary | ICD-10-CM

## 2012-11-28 DIAGNOSIS — Z8739 Personal history of other diseases of the musculoskeletal system and connective tissue: Secondary | ICD-10-CM | POA: Insufficient documentation

## 2012-11-28 DIAGNOSIS — I10 Essential (primary) hypertension: Secondary | ICD-10-CM | POA: Insufficient documentation

## 2012-11-28 DIAGNOSIS — Z90710 Acquired absence of both cervix and uterus: Secondary | ICD-10-CM | POA: Insufficient documentation

## 2012-11-28 DIAGNOSIS — Z7982 Long term (current) use of aspirin: Secondary | ICD-10-CM | POA: Insufficient documentation

## 2012-11-28 DIAGNOSIS — K5730 Diverticulosis of large intestine without perforation or abscess without bleeding: Secondary | ICD-10-CM | POA: Insufficient documentation

## 2012-11-28 DIAGNOSIS — E785 Hyperlipidemia, unspecified: Secondary | ICD-10-CM | POA: Insufficient documentation

## 2012-11-28 DIAGNOSIS — R935 Abnormal findings on diagnostic imaging of other abdominal regions, including retroperitoneum: Secondary | ICD-10-CM

## 2012-11-28 DIAGNOSIS — Z79899 Other long term (current) drug therapy: Secondary | ICD-10-CM | POA: Insufficient documentation

## 2012-11-28 DIAGNOSIS — E119 Type 2 diabetes mellitus without complications: Secondary | ICD-10-CM

## 2012-11-28 DIAGNOSIS — Z87891 Personal history of nicotine dependence: Secondary | ICD-10-CM | POA: Insufficient documentation

## 2012-11-28 DIAGNOSIS — Z9851 Tubal ligation status: Secondary | ICD-10-CM | POA: Insufficient documentation

## 2012-11-28 DIAGNOSIS — E669 Obesity, unspecified: Secondary | ICD-10-CM

## 2012-11-28 DIAGNOSIS — K219 Gastro-esophageal reflux disease without esophagitis: Secondary | ICD-10-CM | POA: Insufficient documentation

## 2012-11-28 LAB — COMPREHENSIVE METABOLIC PANEL
Albumin: 3.8 g/dL (ref 3.5–5.2)
BUN: 16 mg/dL (ref 6–23)
Calcium: 10.1 mg/dL (ref 8.4–10.5)
Creatinine, Ser: 0.75 mg/dL (ref 0.50–1.10)
GFR calc Af Amer: >90 mL/min (ref >90)
Glucose, Bld: 204 mg/dL — ABNORMAL HIGH (ref 70–99)
Total Protein: 7.5 g/dL (ref 6.0–8.3)

## 2012-11-28 LAB — CBC WITH DIFFERENTIAL/PLATELET
Basophils Relative: 0 % (ref 0–1)
Eosinophils Absolute: 0.1 10*3/uL (ref 0.0–0.7)
Eosinophils Relative: 1 % (ref 0–5)
HCT: 38.4 % (ref 36.0–46.0)
Hemoglobin: 12.8 g/dL (ref 12.0–15.0)
Lymphs Abs: 1.2 10*3/uL (ref 0.7–4.0)
MCH: 25.4 pg — ABNORMAL LOW (ref 26.0–34.0)
MCHC: 33.3 g/dL (ref 30.0–36.0)
MCV: 76.3 fL — ABNORMAL LOW (ref 78.0–100.0)
Monocytes Absolute: 0.3 10*3/uL (ref 0.1–1.0)
Monocytes Relative: 4 % (ref 3–12)
Neutrophils Relative %: 82 % — ABNORMAL HIGH (ref 43–77)

## 2012-11-28 LAB — URINALYSIS, ROUTINE W REFLEX MICROSCOPIC
Glucose, UA: NEGATIVE mg/dL
Ketones, ur: NEGATIVE mg/dL
Leukocytes, UA: NEGATIVE
pH: 8.5 — ABNORMAL HIGH (ref 5.0–8.0)

## 2012-11-28 LAB — GLUCOSE, CAPILLARY: Glucose-Capillary: 198 mg/dL — ABNORMAL HIGH (ref 70–99)

## 2012-11-28 LAB — LIPASE, BLOOD: Lipase: 41 U/L (ref 11–59)

## 2012-11-28 MED ORDER — IOHEXOL 300 MG/ML  SOLN: 20.0000 mL | Freq: Once | INTRAMUSCULAR | Status: DC | PRN

## 2012-11-28 MED ORDER — PROMETHAZINE HCL 25 MG PO TABS: 25.0000 mg | ORAL_TABLET | Freq: Four times a day (QID) | ORAL | Status: DC | PRN

## 2012-11-28 MED ORDER — HYDROMORPHONE HCL PF 1 MG/ML IJ SOLN
1.0000 mg | Freq: Once | INTRAMUSCULAR | Status: AC
  Administered 2012-11-28: 1 mg via INTRAVENOUS
  Filled 2012-11-28: qty 1

## 2012-11-28 MED ORDER — SODIUM CHLORIDE 0.9 % IV BOLUS (SEPSIS)
750.0000 mL | Freq: Once | INTRAVENOUS | Status: AC
  Administered 2012-11-28: 750 mL via INTRAVENOUS

## 2012-11-28 MED ORDER — ONDANSETRON HCL 4 MG/2ML IJ SOLN
4.0000 mg | Freq: Once | INTRAMUSCULAR | Status: AC
  Administered 2012-11-28: 4 mg via INTRAVENOUS
  Filled 2012-11-28: qty 2

## 2012-11-28 MED ORDER — IOHEXOL 300 MG/ML  SOLN
100.0000 mL | Freq: Once | INTRAMUSCULAR | Status: AC | PRN
  Administered 2012-11-28: 100 mL via INTRAVENOUS

## 2012-11-28 MED ORDER — POTASSIUM CHLORIDE CRYS ER 20 MEQ PO TBCR
40.0000 meq | EXTENDED_RELEASE_TABLET | Freq: Once | ORAL | Status: AC
  Administered 2012-11-28: 40 meq via ORAL
  Filled 2012-11-28: qty 2

## 2012-11-28 MED ORDER — MORPHINE SULFATE 4 MG/ML IJ SOLN
4.0000 mg | Freq: Once | INTRAMUSCULAR | Status: AC
  Administered 2012-11-28: 4 mg via INTRAVENOUS
  Filled 2012-11-28: qty 1

## 2012-11-28 MED ORDER — HYDROCODONE-ACETAMINOPHEN 5-325 MG PO TABS: 2.0000 | ORAL_TABLET | ORAL | Status: DC | PRN

## 2012-11-28 NOTE — ED Notes (Addendum)
Pt reports having generalized abd pain with nausea and vomiting since this evening; describes pain as intermittent and sharp; pt denies CP

## 2012-11-28 NOTE — ED Notes (Signed)
Pt states pain increasing.  Dr Lorenso Courier notified.

## 2012-11-28 NOTE — ED Notes (Signed)
Pt transported to CT scanner, then to be moved to CDU.

## 2012-11-28 NOTE — ED Notes (Signed)
Pt with desats to 78% after 1 mg of dilaudid given.  Placed on 2 L O2 and improved to 98%.

## 2012-11-28 NOTE — ED Provider Notes (Signed)
Patient placed in CDU by Dana Allan, MD to await CT scan .  Patient is here for N/V and abd pain and has received IVF, meds and labs.   Plan per previous provider is to await CT scan and d/c home if negative.  Patient re-evaluated and is resting comfortably, VSS, with no new complaints or concerns at this time.  On exam: hemodynamically stable, NAD, heart w/ RRR, lungs CTAB, abd mildly tender throughout, no peripheral edema or calf tenderness.  BP 109/69  Pulse 58  Temp 98.1 F (36.7 C) (Oral)  Resp 18  SpO2 100%  Discussed with patient current lab and imaging results as well as their care plan, patient questions answered.  Patient is amenable to the plan.  11:29 AM CT with Short to moderate length thickening of the mid/distal ileum not resulting in enteric obstruction and similar to prior abdominal CTs performed on 05/17/2012 and 10/19/2010. While nonspecific, findings again remain concerning for an inflammatory etiology (Crohn disease), less likely regional infectious enteritis or ischemia.  On reevaluation patient without a surgical abdomen, nontender to palpation.   12:19 PM Patient alert and oriented, NAD, nontoxic, nonseptic appearing. CT scan results today reviewed with patient and similar to prior November 2011 and June 2013.  CT without evidence of infection.   Patient tolerating by mouth's without difficulty. Will discharge home and have her followup with gastroenterology.  1. Medications: norco, phenergan, usual home medications 2. Treatment: rest, drink plenty of fluids,  3. Follow Up: Please followup with your primary doctor for discussion of your diagnoses and further evaluation after today's visit; followup with your gastroenterologist about today's visit.   Dahlia Client Jolene Guyett, PA-C 11/28/12 1228

## 2012-11-28 NOTE — ED Provider Notes (Signed)
Medical screening examination/treatment/procedure(s) were performed by non-physician practitioner and as supervising physician I was immediately available for consultation/collaboration.   Gwyneth Sprout, MD 11/28/12 1550

## 2012-11-28 NOTE — ED Provider Notes (Signed)
History     CSN: [id number]  Arrival date & time 11/28/12  0235   First MD Initiated Contact with Patient 11/28/12 0241      Chief Complaint  Patient presents with  . Abdominal Pain    (Consider location/radiation/quality/duration/timing/severity/associated sxs/prior treatment) HPI Comments: Meredith [name] reports having severe abdominal pain.  She thinks she may have eaten spoiled food, ate some oodles of noodles and chicken that was prepared last night and only partially consumed but not refrigerated.  No one else consumed the same meal.  She denies fever, respiratory issues, CP, diarrhea, constipation, dysuria, flank/back pain, bleeding, or abnormal discharges.  Patient is a 61 y.o. female presenting with abdominal pain. The history is provided by the patient. No language interpreter was used.  Abdominal Pain The primary symptoms of the illness include abdominal pain, nausea and vomiting. The primary symptoms of the illness do not include fever, fatigue, shortness of breath, diarrhea, hematemesis, hematochezia, dysuria, vaginal discharge or vaginal bleeding. The current episode started 3 to 5 hours ago. The onset of the illness was gradual. The problem has been gradually worsening.  The illness is associated with eating. The patient states that she believes she is currently not pregnant. The patient has not had a change in bowel habit. Risk factors for an acute abdominal problem include a history of abdominal surgery. Additional symptoms associated with the illness include anorexia. Symptoms associated with the illness do not include chills, diaphoresis, heartburn, constipation, urgency, hematuria, frequency or back pain. Significant associated medical issues include diabetes.    Past Medical History  Diagnosis Date  . Diabetes mellitus   . Hypertension   . Hyperlipidemia   . Osteoarthritis   . ANEMIA, IRON DEFICIENCY, UNSPEC. 02/05/2007    Qualifier: Diagnosis of  By: Para March MD, Cheree Ditto       Past Surgical History  Procedure Date  . Tubal ligation   . Uterine fibroid surgery   . Operative hysteroscopy   . Abdominal hysterectomy     No family history on file.  History  Substance Use Topics  . Smoking status: Former Games developer  . Smokeless tobacco: Not on file  . Alcohol Use: No    OB History    Grav Para Term Preterm Abortions TAB SAB Ect Mult Living                  Review of Systems  Constitutional: Negative for fever, chills, diaphoresis and fatigue.  Respiratory: Negative for shortness of breath.   Gastrointestinal: Positive for nausea, vomiting, abdominal pain and anorexia. Negative for heartburn, diarrhea, constipation, hematochezia and hematemesis.  Genitourinary: Negative for dysuria, urgency, frequency, hematuria, vaginal bleeding and vaginal discharge.  Musculoskeletal: Negative for back pain.  All other systems reviewed and are negative.    Allergies  Flexeril; Lipitor; and Metaxalone  Home Medications   Current Outpatient Rx  Name  Route  Sig  Dispense  Refill  . ASPIRIN 81 MG PO CHEW   Oral   Chew 81 mg by mouth daily.         Marland Kitchen CLOPIDOGREL BISULFATE 75 MG PO TABS   Oral   Take 75 mg by mouth daily.         Marland Kitchen LISINOPRIL-HYDROCHLOROTHIAZIDE 20-12.5 MG PO TABS   Oral   Take 2 tablets by mouth daily.   180 tablet   3   . METFORMIN HCL ER (OSM) 1000 MG PO TB24   Oral   Take 2 tablets (2,000 mg total) by mouth  daily with breakfast.   60 tablet   11   . METOCLOPRAMIDE HCL 10 MG PO TABS   Oral   Take 1 tablet (10 mg total) by mouth every 6 (six) hours as needed (nausea/headache).   6 tablet   0   . METOPROLOL TARTRATE 25 MG PO TABS   Oral   Take 1 tablet (25 mg total) by mouth 2 (two) times daily.   180 tablet   3   . POLYETHYLENE GLYCOL 3350 PO POWD   Oral   Take 17 g by mouth as needed. Add 1 packet in a glass of water daily for constipation          . PRAVASTATIN SODIUM 80 MG PO TABS   Oral   Take 1 tablet (80  mg total) by mouth daily.   90 tablet   3   . TRAMADOL HCL 50 MG PO TABS   Oral   Take 2 tablets (100 mg total) by mouth every 8 (eight) hours as needed for pain.   60 tablet   2     BP 142/97  Pulse 95  Temp 98.5 F (36.9 C) (Oral)  Resp 16  SpO2 97%  Physical Exam  Nursing note and vitals reviewed. Constitutional: She is oriented to person, place, and time. She appears well-developed and well-nourished. No distress.       Pt is obese.  HENT:  Head: Normocephalic and atraumatic.  Right Ear: External ear normal.  Left Ear: External ear normal.  Nose: Nose normal.  Mouth/Throat: Oropharynx is clear and moist.  Eyes: Conjunctivae normal are normal. Pupils are equal, round, and reactive to light. Right eye exhibits no discharge. Left eye exhibits no discharge. No scleral icterus.  Neck: Normal range of motion. Neck supple. No JVD present. No tracheal deviation present.  Cardiovascular: Normal rate, regular rhythm, normal heart sounds and intact distal pulses.  Exam reveals no gallop and no friction rub.   No murmur heard. Pulmonary/Chest: Effort normal and breath sounds normal. No stridor. No respiratory distress. She has no wheezes. She has no rales. She exhibits no tenderness.  Abdominal: Normal appearance and bowel sounds are normal. She exhibits no shifting dullness, no distension, no pulsatile liver, no abdominal bruit, no ascites, no pulsatile midline mass and no mass. There is generalized tenderness (more lower than upper). There is guarding. There is no rigidity, no rebound, no CVA tenderness, no tenderness at McBurney's point and negative Murphy's sign. No hernia.  Musculoskeletal: Normal range of motion. She exhibits no edema and no tenderness.  Lymphadenopathy:    She has no cervical adenopathy.  Neurological: She is alert and oriented to person, place, and time. No cranial nerve deficit.  Skin: Skin is warm and dry. No rash noted. She is not diaphoretic. No erythema.  No pallor.  Psychiatric: She has a normal mood and affect. Her behavior is normal.    ED Course  Procedures (including critical care time)  Labs Reviewed  GLUCOSE, CAPILLARY - Abnormal; Notable for the following:    Glucose-Capillary 198 (*)     All other components within normal limits  URINALYSIS, ROUTINE W REFLEX MICROSCOPIC  CBC WITH DIFFERENTIAL  COMPREHENSIVE METABOLIC PANEL  LIPASE, BLOOD  LACTIC ACID, PLASMA   No results found.   No diagnosis found.    MDM  Pt presents for evaluation of abdominal pain and vomiting.  She appears uncomfortable, note elevated BP, NAD.  Will obtain basic belly labs and lactic acid.  Will  treat symptoms with zofran and morphine.  Will reassess as results become available.  0500.  Pt stable, NAD.  Pain is improved.  She reports having previous similar episodes of abdominal pain.  Will reassess in 1 hr.  Note mild hypokalemia and elevated blood sugar.  Remainder of belly labs are essential normal.  She has no evidence of peritonitis currently on exam.  0750.  Pt again is complaining of pain.  Ct scan has been ordered.  Previous studies demonstrated nonspecific inflammatory changes.  Will move to the CDU for continued symptomatic control pending CT scan.    Tobin Chad, MD 11/28/12 [phone]

## 2012-11-30 ENCOUNTER — Telehealth: Payer: Self-pay | Admitting: Family Medicine

## 2012-11-30 DIAGNOSIS — E876 Hypokalemia: Secondary | ICD-10-CM

## 2012-11-30 NOTE — Telephone Encounter (Signed)
Would rec nurse visit with BMET in 1-2 weeks.  Order placed.

## 2012-11-30 NOTE — Telephone Encounter (Signed)
Patient was seen in ED on 11/28/12.  Potassium was 3.2 and patient received 40 mEq po x 1.  Will route note to Dr. Louanne Belton for advice and call patient back.  Gaylene Brooks, RN

## 2012-11-30 NOTE — Telephone Encounter (Signed)
Returned called to patient and informed to have labs checked in 1-2 weeks.  Patient will call back for lab appt.  Gaylene Brooks, RN

## 2012-11-30 NOTE — Telephone Encounter (Signed)
Patient is calling because she was in the ER over the weekend for low potassium and was told she needs to contact her MD.  She called hoping that her MD would call something in for her, but she hasn't seen him since October on a completely unrelated issue.  I attempted to schedule her to be seen, but she said she doesn't have the money for a copay.  She would like to speak to the nurse about suggestions of things that she can eat that will increase her potassium.

## 2012-12-25 ENCOUNTER — Encounter: Payer: Self-pay | Admitting: Family Medicine

## 2012-12-25 ENCOUNTER — Ambulatory Visit (INDEPENDENT_AMBULATORY_CARE_PROVIDER_SITE_OTHER): Payer: BC Managed Care – PPO | Admitting: Family Medicine

## 2012-12-25 VITALS — BP 173/77 | HR 76 | Temp 98.1°F | Ht 63.0 in | Wt 251.2 lb

## 2012-12-25 DIAGNOSIS — R109 Unspecified abdominal pain: Secondary | ICD-10-CM

## 2012-12-25 DIAGNOSIS — E876 Hypokalemia: Secondary | ICD-10-CM

## 2012-12-25 DIAGNOSIS — E119 Type 2 diabetes mellitus without complications: Secondary | ICD-10-CM

## 2012-12-25 DIAGNOSIS — I10 Essential (primary) hypertension: Secondary | ICD-10-CM

## 2012-12-25 DIAGNOSIS — E669 Obesity, unspecified: Secondary | ICD-10-CM

## 2012-12-25 LAB — BASIC METABOLIC PANEL
CO2: 29 mEq/L (ref 19–32)
Calcium: 10.1 mg/dL (ref 8.4–10.5)
Creat: 0.66 mg/dL (ref 0.50–1.10)
Glucose, Bld: 131 mg/dL — ABNORMAL HIGH (ref 70–99)
Sodium: 141 mEq/L (ref 135–145)

## 2012-12-25 LAB — POCT GLYCOSYLATED HEMOGLOBIN (HGB A1C): Hemoglobin A1C: 7.1

## 2012-12-25 LAB — POCT SEDIMENTATION RATE: POCT SED RATE: 30 mm/hr — AB (ref 0–22)

## 2012-12-25 NOTE — Assessment & Plan Note (Signed)
Patient will continue on her metformin every day. I don't feel strongly about starting additional medications since her A1c has been hovering around 7 for the last year.

## 2012-12-25 NOTE — Patient Instructions (Signed)
It was good to see you today! I would like you to start back to taking 2 of your blood pressure pills at night.  Come back for a nurse visit in about 2 weeks so they can check your blood pressure and we can see if we need to make more changes. Good job on your weight!  Keep up the good work! We will recheck your potassium and some blood tests for inflammation. You can try one of the soft rubber knee braces for your pain.  You can get it either at a medical supply store or at the drug store.  Do not get one with bracing material on the sides.

## 2012-12-25 NOTE — Assessment & Plan Note (Signed)
Congratulated the patient on her slight weight loss. Patient has a stated goal of losing 50 pounds. I've encouraged her to continue working on this.

## 2012-12-25 NOTE — Progress Notes (Signed)
Patient ID: Meredith [name], female   DOB: 1951/11/19, 62 y.o.   MRN: [id number] Subjective: The patient is a 62 y.o. year old female who presents today for ED f/u.  1. Hypokalemia: This was found and the patient was in the emergency department for abdominal pain. She denies any muscle cramps or significant weakness. She is eating a normal diet.  2. Abdominal pain: Unclear as to the exact etiology. Thickening of the terminal ileum was noted again on CT scan. The patient is currently pain free and is not having any problems with nausea or vomiting. It is not reporting any blood in her stools.  3. Diabetes: She denies any episodes of hypoglycemia. She continues taking metformin. Please see A1c today. She is not interested in any further medication for this.  4. Blood pressure: High the emergency department. Patient reports being instructed by ED provider to only take one of her blood pressure medications. She denies any headaches, visual changes, chest pain, shortness of breath, or syncope. She does not report any new leg swelling.  Patient's past medical, social, and family history were reviewed and updated as appropriate. History  Substance Use Topics  . Smoking status: Former Games developer  . Smokeless tobacco: Not on file  . Alcohol Use: No   Objective:  Filed Vitals:   12/25/12 0850  BP: 173/77  Pulse: 76  Temp: 98.1 F (36.7 C)   Gen: No acute distress, morbidly obese Abd: Soft, nontender, nondistended.  No evidence of significant hernia on exam.  No guarding or rebound.  Exam somewhat limited by body habitus.  Assessment/Plan: Abd pain, recurrent: Unclear etiology.  Will obtain ESR/CRP.  If high, with thickening of TI on CT, will likely obtain stool occult blood.  Will need to also obtain colonoscopy report from 2011 (I cannot seem to find it in EMR)  Hypokalemia: Likely resolved.  Recheck today.  Please also see individual problems in problem list for problem-specific plans.

## 2012-12-25 NOTE — Assessment & Plan Note (Signed)
Patient was instructed to restart taking 2 of her lisinopril/HCTZ 20/12.5 every day. This was referred to a total of 40 mg of lisinopril and 25 mg HCTZ daily. She will return for a blood pressure check in a week and a half to 2 weeks time.

## 2013-01-01 ENCOUNTER — Encounter: Payer: Self-pay | Admitting: Family Medicine

## 2013-01-08 ENCOUNTER — Ambulatory Visit (INDEPENDENT_AMBULATORY_CARE_PROVIDER_SITE_OTHER): Payer: BC Managed Care – PPO | Admitting: Family Medicine

## 2013-01-08 VITALS — BP 142/75 | HR 76 | Temp 98.0°F | Ht 63.0 in | Wt 238.9 lb

## 2013-01-08 DIAGNOSIS — R1030 Lower abdominal pain, unspecified: Secondary | ICD-10-CM

## 2013-01-08 DIAGNOSIS — R109 Unspecified abdominal pain: Secondary | ICD-10-CM

## 2013-01-08 LAB — POCT URINALYSIS DIPSTICK
Ketones, UA: NEGATIVE
Protein, UA: 30
Spec Grav, UA: >=1.03
Urobilinogen, UA: 1
pH, UA: 5.5

## 2013-01-08 LAB — POCT UA - MICROSCOPIC ONLY

## 2013-01-08 NOTE — Assessment & Plan Note (Signed)
Recurrent and chronic, possibly related to nonspecific colon thickening seen on recent CT scan. No red flags, systemic signs or history to suggest bowel obstruction (had recent BM and no n/v), bowel infection/infarction, hernia, ovarian pathology. Could be functional bowel pain, constipation or early colitis. Since this is usually self-limited in the past and she looks well today, will stick with conservative therapy. Recommend restart miralax at home, she can continue her analgesics prescribed by ER (vicodin or can try motrin/tylenol). Stick with fluids. Will check UA to rule out silent UTI and stool cards to assess for signs of colitis. Discussed with patient she might benefit from f/u with Dr. Juanda Chance given the chronicity of problem to assess bowel endoscopically in the future. Provided red flags to seek emergency care, otherwise f/u in 1-2 weeks with PCP.

## 2013-01-08 NOTE — Patient Instructions (Signed)
Your cough is getting better, most likely its a virus that will clear in next days-weeks. No sign of bacterial infection. Maybe your stomach cramping is from constipation. You may try your miralax/fiber. If you have vomiting, fever then go to the ER. Bring back your stool cards.   Abdominal Pain (Nonspecific) Your exam might not show the exact reason you have abdominal pain. Since there are many different causes of abdominal pain, another checkup and more tests may be needed. It is very important to follow up for lasting (persistent) or worsening symptoms. A possible cause of abdominal pain in any person who still has his or her appendix is acute appendicitis. Appendicitis is often hard to diagnose. Normal blood tests, urine tests, ultrasound, and CT scans do not completely rule out early appendicitis or other causes of abdominal pain. Sometimes, only the changes that happen over time will allow appendicitis and other causes of abdominal pain to be determined. Other potential problems that may require surgery may also take time to become more apparent. Because of this, it is important that you follow all of the instructions below. HOME CARE INSTRUCTIONS   Rest as much as possible.  Do not eat solid food until your pain is gone.  While adults or children have pain: A diet of water, weak decaffeinated tea, broth or bouillon, gelatin, oral rehydration solutions (ORS), frozen ice pops, or ice chips may be helpful.  When pain is gone in adults or children: Start a light diet (dry toast, crackers, applesauce, or white rice). Increase the diet slowly as long as it does not bother you. Eat no dairy products (including cheese and eggs) and no spicy, fatty, fried, or high-fiber foods.  Use no alcohol, caffeine, or cigarettes.  Take your regular medicines unless your caregiver told you not to.  Take any prescribed medicine as directed.  Only take over-the-counter or prescription medicines for pain,  discomfort, or fever as directed by your caregiver. Do not give aspirin to children. If your caregiver has given you a follow-up appointment, it is very important to keep that appointment. Not keeping the appointment could result in a permanent injury and/or lasting (chronic) pain and/or disability. If there is any problem keeping the appointment, you must call to reschedule.  SEEK IMMEDIATE MEDICAL CARE IF:   Your pain is not gone in 24 hours.  Your pain becomes worse, changes location, or feels different.  You or your child has an oral temperature above 102 F (38.9 C), not controlled by medicine.  Your baby is older than 3 months with a rectal temperature of 102 F (38.9 C) or higher.  Your baby is 89 months old or younger with a rectal temperature of 100.4 F (38 C) or higher.  You have shaking chills.  You keep throwing up (vomiting) or cannot drink liquids.  There is blood in your vomit or you see blood in your bowel movements.  Your bowel movements become dark or black.  You have frequent bowel movements.  Your bowel movements stop (become blocked) or you cannot pass gas.  You have bloody, frequent, or painful urination.  You have yellow discoloration in the skin or whites of the eyes.  Your stomach becomes bloated or bigger.  You have dizziness or fainting.  You have chest or back pain. MAKE SURE YOU:   Understand these instructions.  Will watch your condition.  Will get help right away if you are not doing well or get worse. Document Released: 11/25/2005 Document Revised:  02/17/2012 Document Reviewed: 10/23/2009 Exit[employer] Patient Information 2013 [place], [place].

## 2013-01-08 NOTE — Progress Notes (Signed)
  Subjective:    Patient ID: Meredith [name], female    DOB: August 05, 1951, 62 y.o.   MRN: [id number]  HPI  1. Cough. Started one week ago and now improving, nonproductive with nasal congestion. She thinks it may have triggered her recurrent abd pain. No wheezing, headache, purulent rhinorrhea, facial pain, fever, chills, dyspnea, chest pain.   2. Recurrent lower abdominal pain. Feels crampy in bilateral lower abdomen, states it moves to different places. Present intermittently for past one year. Current started yesterday evening. She had eaten some soup and crackers afterwards. Took a vicodin and slept well. Had a normal BM yesterday. She does endorse some constipation sometimes, has miralax at home but not using this.  Had a CT scan showing nonspecific bowel thickening in Dec 2013. Normal labs studies, inflammatory markers in clinic last week. Had a colonoscopy with 4 polyps in 2011 performed by Dr. Juanda Chance.  Hx of hysterectomy for fibroids, BTL.   Had dysuria today, no frequency or hesitancy. Denies fever, chills, diarrhea, blood in stool, emesis, nausea, vaginal bleeding.   Review of Systems See HPI otherwise negative.  reports that she has quit smoking. She does not have any smokeless tobacco history on file.     Objective:   Physical Exam  Vitals reviewed. Constitutional: She is oriented to person, place, and time. She appears well-developed and well-nourished. No distress.       Does not appear in pain.  HENT:  Head: Normocephalic and atraumatic.  Nose: Nose normal.  Mouth/Throat: Oropharynx is clear and moist. No oropharyngeal exudate.  Eyes: EOM are normal. Pupils are equal, round, and reactive to light.  Neck: Neck supple.  Cardiovascular: Normal rate, regular rhythm and normal heart sounds.   No murmur heard. Pulmonary/Chest: Effort normal and breath sounds normal. No respiratory distress. She has no wheezes. She has no rales.  Abdominal: Soft. Bowel sounds are normal. She  exhibits no distension. There is tenderness. There is no rebound and no guarding.       Mild TTP in peri-umbilical area.  No rebound or guarding.  Lymphadenopathy:    She has no cervical adenopathy.  Neurological: She is alert and oriented to person, place, and time.  Skin: She is not diaphoretic.  Psychiatric: She has a normal mood and affect.          Assessment & Plan:

## 2013-01-08 NOTE — Addendum Note (Signed)
Addended by: [place], Naveen Clardy on: 01/08/2013 09:58 AM   Modules accepted: Orders

## 2013-01-22 ENCOUNTER — Ambulatory Visit: Payer: BC Managed Care – PPO | Admitting: Family Medicine

## 2013-01-25 ENCOUNTER — Telehealth: Payer: Self-pay | Admitting: Family Medicine

## 2013-01-25 NOTE — Telephone Encounter (Signed)
Patient is calling because her pharmacy is telling her that her Metformin is going to cost her more that $100 and she doesn't understand why with her having insurance through [place].

## 2013-01-25 NOTE — Telephone Encounter (Signed)
After talking with Dr Louanne Belton ok to change rx to 500 mg ER,that would cost patient 11:25 co-pay.[facility] was called and pt informed of change. Meredith [name], Virgel Bouquet

## 2013-03-17 ENCOUNTER — Ambulatory Visit (INDEPENDENT_AMBULATORY_CARE_PROVIDER_SITE_OTHER): Payer: BC Managed Care – PPO | Admitting: Family Medicine

## 2013-03-17 VITALS — BP 153/88 | HR 78 | Temp 98.8°F | Wt 248.0 lb

## 2013-03-17 DIAGNOSIS — R300 Dysuria: Secondary | ICD-10-CM

## 2013-03-17 DIAGNOSIS — R109 Unspecified abdominal pain: Secondary | ICD-10-CM

## 2013-03-17 LAB — POCT UA - MICROSCOPIC ONLY

## 2013-03-17 LAB — POCT URINALYSIS DIPSTICK
Blood, UA: NEGATIVE
Glucose, UA: NEGATIVE
Leukocytes, UA: NEGATIVE
Nitrite, UA: POSITIVE
Urobilinogen, UA: 0.2

## 2013-03-17 MED ORDER — FLUCONAZOLE 150 MG PO TABS: 150.0000 mg | ORAL_TABLET | Freq: Once | ORAL | Status: DC

## 2013-03-17 NOTE — Assessment & Plan Note (Addendum)
-   Patient is here again for chronic intermittent lower abdominal pain. She has had a CT, in December and it showed no acute process, but possible inflammatory process. This probelm has self resolved on prior events. Will again recommend trying conservative therapy. No red flags or fever or signs of acute abdomen. This may be signs of early gastroenteritis, constipation or problem with her diverticulosis.  No concern for obstruction with recent BM and no vomiting.  - UA today: Will r/o UTI, many yeast and moderate WBC. Will order diflucan x1 and follow cultures.  - encouraged patient to contact her GI doctor to get a check up since this has been a chronic issue for her. She may have Irritable bowel syndrome/disease or more polyps - Encourage PO fluid and she can continue to take her pain medications she has with her, but recommend daily miralax to avoid constipation with narcotics. Provided red flags to return to the ED. - Patient understands plan - Recommended following up with her PCP in 1-2 weeks to attempt prevention of future attacks.

## 2013-03-17 NOTE — Addendum Note (Signed)
Addended by: [place], Xavior Niazi on: 03/17/2013 04:17 PM   Modules accepted: Orders

## 2013-03-17 NOTE — Progress Notes (Signed)
Subjective:     Patient ID: Meredith [name], female   DOB: 01-Dec-1951, 62 y.o.   MRN: [id number]  HPI Abdominal pain: Pt presents today with a one day history of abdominal pain that started this morning. She denies fever, vomit, or constipation. She has decreased appetite. She had x4 BM this morning that were non-bloody and loose. Her last solid BM was 4 days ago. She has been seen by GI in the past for colonoscopy were they removed 4 polyps (per patient) two years ago. She complains pain with urination, but no frequency or incomplete empty. She had a CT in December that was concerning for possible chron's disease or inflammatory etiology. She denies weight loss, but reports she feels bloated.   Review of Systems See above HPI    Objective:   Physical Exam BP 153/88  Pulse 78  Temp(Src) 98.8 F (37.1 C) (Oral)  Wt 248 lb (112.492 kg)  BMI 43.94 kg/m2 General: slightly sedated, cooperative, NAD. Falling asleep in chair. HEENT: PERRLA, extra ocular movement intact and sclera clear, anicteric  Heart: RRR. 1/6 murmur Lungs:  clear to auscultation, no wheezes or rales. Abdomen: Obese. Soft. TTP diffusely, however no peritoneal signs. Heel jolt caused no pain to her. No rebound. No guarding.  Skin:no rashes  Neurology: normal without focal findings, mental status, speech normal, alert and oriented x3 and PERLA      Ct Abdomen Pelvis W Contrast 1.  Short to moderate length thickening of the mid/distal ileum not resulting in enteric obstruction and similar to prior abdominal CTs performed on 05/17/2012 and 10/19/2010.  While nonspecific, findings again remain concerning for an inflammatory etiology (Crohn disease), less likely regional infectious enteritis or ischemia.  2.  Small amount of free fluid in the abdomen and pelvis, also similar to prior abdominal CTs.  No drainable fluid collection.  3.  Borderline cardiomegaly.  Coronary artery calcifications. 4.  Multilevel lumbar spine degenerative  change.   Original Report Authenticated By: Tacey Ruiz, MD

## 2013-03-17 NOTE — Patient Instructions (Signed)
Your urine today showed yeast, I have called in a pill called diflucan for you to take for this. I will call you in a few days when your final urine culture returns if you have a urinary tract infection.   Please drink plenty of water and keep your bowels moving daily. You should contact you GI doctor to explain your chronic symptoms, they may want to see you sooner.   Follow up with your PCP in 1-2 weeks  Abdominal Pain Abdominal pain can be caused by many things. Your caregiver decides the seriousness of your pain by an examination and possibly blood tests and X-rays. Many cases can be observed and treated at home. Most abdominal pain is not caused by a disease and will probably improve without treatment. However, in many cases, more time must pass before a clear cause of the pain can be found. Before that point, it may not be known if you need more testing, or if hospitalization or surgery is needed. HOME CARE INSTRUCTIONS   Do not take laxatives unless directed by your caregiver.  Take pain medicine only as directed by your caregiver.  Only take over-the-counter or prescription medicines for pain, discomfort, or fever as directed by your caregiver.  Try a clear liquid diet (broth, tea, or water) for as long as directed by your caregiver. Slowly move to a bland diet as tolerated. SEEK IMMEDIATE MEDICAL CARE IF:   The pain does not go away.  You have a fever.  You keep throwing up (vomiting).  The pain is felt only in portions of the abdomen. Pain in the right side could possibly be appendicitis. In an adult, pain in the left lower portion of the abdomen could be colitis or diverticulitis.  You pass bloody or black tarry stools. MAKE SURE YOU:   Understand these instructions.  Will watch your condition.  Will get help right away if you are not doing well or get worse. Document Released: 09/04/2005 Document Revised: 02/17/2012 Document Reviewed: 07/13/2008 [facility] Patient  Information 2013 [place], [place].

## 2013-04-08 ENCOUNTER — Telehealth: Payer: Self-pay | Admitting: Family Medicine

## 2013-04-08 NOTE — Telephone Encounter (Signed)
Today I received a result in my mailbox I had not seen prior, I think the micro just came back (not certain). Please call patient and ask if her symptoms have improved from her possible UTI. If not and she is having symptoms please message me back and I will treat her with Keflex.

## 2013-04-09 ENCOUNTER — Encounter: Payer: Self-pay | Admitting: Family Medicine

## 2013-04-09 ENCOUNTER — Telehealth: Payer: Self-pay | Admitting: *Deleted

## 2013-04-09 DIAGNOSIS — N390 Urinary tract infection, site not specified: Secondary | ICD-10-CM

## 2013-04-09 MED ORDER — CEPHALEXIN 500 MG PO CAPS: 500.0000 mg | ORAL_CAPSULE | Freq: Two times a day (BID) | ORAL | Status: AC

## 2013-04-09 NOTE — Telephone Encounter (Signed)
Patient would like antibiotic/keflex called in she's still having some symptoms.thank you

## 2013-04-09 NOTE — Telephone Encounter (Signed)
Left message on voicemail and requested a return call. Denee Boeder, Virgel Bouquet

## 2013-04-16 ENCOUNTER — Telehealth: Payer: Self-pay | Admitting: *Deleted

## 2013-04-16 NOTE — Telephone Encounter (Signed)
Pt came in with questions regarding cream that was sent to pt home. Dr. Louanne Belton explained cream was for leg pain related to neuropathy. Cream had been suggested by pharmacy to Dr. Louanne Belton. Pt verbalized understanding and had no further questions. Wyatt Haste, RN-BSN

## 2013-05-23 ENCOUNTER — Other Ambulatory Visit: Payer: Self-pay | Admitting: Family Medicine

## 2013-05-27 ENCOUNTER — Other Ambulatory Visit: Payer: Self-pay

## 2013-05-27 DIAGNOSIS — Z1231 Encounter for screening mammogram for malignant neoplasm of breast: Secondary | ICD-10-CM

## 2013-05-31 ENCOUNTER — Encounter (HOSPITAL_COMMUNITY): Payer: Self-pay | Admitting: Emergency Medicine

## 2013-05-31 ENCOUNTER — Emergency Department (HOSPITAL_COMMUNITY)
Admission: EM | Admit: 2013-05-31 | Discharge: 2013-05-31 | Disposition: A | Discharge status: Home / Self Care | Payer: BC Managed Care – PPO | Attending: Emergency Medicine | Admitting: Emergency Medicine

## 2013-05-31 ENCOUNTER — Emergency Department (HOSPITAL_COMMUNITY): Payer: BC Managed Care – PPO

## 2013-05-31 ENCOUNTER — Other Ambulatory Visit: Payer: Self-pay

## 2013-05-31 DIAGNOSIS — R110 Nausea: Secondary | ICD-10-CM | POA: Insufficient documentation

## 2013-05-31 DIAGNOSIS — R109 Unspecified abdominal pain: Secondary | ICD-10-CM | POA: Insufficient documentation

## 2013-05-31 DIAGNOSIS — Z79899 Other long term (current) drug therapy: Secondary | ICD-10-CM | POA: Insufficient documentation

## 2013-05-31 DIAGNOSIS — E119 Type 2 diabetes mellitus without complications: Secondary | ICD-10-CM | POA: Insufficient documentation

## 2013-05-31 DIAGNOSIS — M1990 Unspecified osteoarthritis, unspecified site: Secondary | ICD-10-CM | POA: Insufficient documentation

## 2013-05-31 DIAGNOSIS — Z888 Allergy status to other drugs, medicaments and biological substances status: Secondary | ICD-10-CM | POA: Insufficient documentation

## 2013-05-31 DIAGNOSIS — I10 Essential (primary) hypertension: Secondary | ICD-10-CM | POA: Insufficient documentation

## 2013-05-31 DIAGNOSIS — Z87891 Personal history of nicotine dependence: Secondary | ICD-10-CM | POA: Insufficient documentation

## 2013-05-31 DIAGNOSIS — E785 Hyperlipidemia, unspecified: Secondary | ICD-10-CM

## 2013-05-31 LAB — CBC WITH DIFFERENTIAL/PLATELET
Basophils Relative: 0 % (ref 0–1)
Eosinophils Absolute: 0.1 10*3/uL (ref 0.0–0.7)
Eosinophils Relative: 1 % (ref 0–5)
HCT: 38.5 % (ref 36.0–46.0)
Hemoglobin: 13.6 g/dL (ref 12.0–15.0)
Lymphs Abs: 1.8 10*3/uL (ref 0.7–4.0)
MCH: 26.6 pg (ref 26.0–34.0)
MCHC: 35.3 g/dL (ref 30.0–36.0)
MCV: 75.2 fL — ABNORMAL LOW (ref 78.0–100.0)
Monocytes Absolute: 0.5 10*3/uL (ref 0.1–1.0)
Monocytes Relative: 5 % (ref 3–12)
Neutrophils Relative %: 75 % (ref 43–77)
RBC: 5.12 MIL/uL — ABNORMAL HIGH (ref 3.87–5.11)

## 2013-05-31 LAB — COMPREHENSIVE METABOLIC PANEL
Alkaline Phosphatase: 51 U/L (ref 39–117)
BUN: 15 mg/dL (ref 6–23)
Creatinine, Ser: 0.71 mg/dL (ref 0.50–1.10)
GFR calc Af Amer: >90 mL/min (ref >90)
Glucose, Bld: 129 mg/dL — ABNORMAL HIGH (ref 70–99)
Potassium: 3.5 mEq/L (ref 3.5–5.1)
Total Protein: 7.7 g/dL (ref 6.0–8.3)

## 2013-05-31 LAB — GLUCOSE, CAPILLARY: Glucose-Capillary: 83 mg/dL (ref 70–99)

## 2013-05-31 LAB — URINALYSIS, ROUTINE W REFLEX MICROSCOPIC
Bilirubin Urine: NEGATIVE
Ketones, ur: NEGATIVE mg/dL
Leukocytes, UA: NEGATIVE
Nitrite: NEGATIVE
Specific Gravity, Urine: 1.022 (ref 1.005–1.030)
Urobilinogen, UA: 1 mg/dL (ref 0.0–1.0)

## 2013-05-31 LAB — LIPASE, BLOOD: Lipase: 34 U/L (ref 11–59)

## 2013-05-31 LAB — POCT I-STAT TROPONIN I: Troponin i, poc: 0.01 ng/mL (ref 0.00–0.08)

## 2013-05-31 MED ORDER — ONDANSETRON HCL 4 MG/2ML IJ SOLN
4.0000 mg | Freq: Once | INTRAMUSCULAR | Status: AC
  Administered 2013-05-31: 4 mg via INTRAVENOUS
  Filled 2013-05-31: qty 2

## 2013-05-31 MED ORDER — HYDROMORPHONE HCL PF 1 MG/ML IJ SOLN
0.5000 mg | Freq: Once | INTRAMUSCULAR | Status: AC
  Administered 2013-05-31: 0.5 mg via INTRAVENOUS
  Filled 2013-05-31: qty 1

## 2013-05-31 MED ORDER — MORPHINE SULFATE 4 MG/ML IJ SOLN
4.0000 mg | Freq: Once | INTRAMUSCULAR | Status: AC
  Administered 2013-05-31: 4 mg via INTRAVENOUS
  Filled 2013-05-31: qty 1

## 2013-05-31 MED ORDER — SODIUM CHLORIDE 0.9 % IV BOLUS (SEPSIS)
1000.0000 mL | Freq: Once | INTRAVENOUS | Status: AC
  Administered 2013-05-31: 1000 mL via INTRAVENOUS

## 2013-05-31 MED ORDER — HYDROCODONE-ACETAMINOPHEN 5-325 MG PO TABS: 1.0000 | ORAL_TABLET | Freq: Three times a day (TID) | ORAL | Status: DC | PRN

## 2013-05-31 MED ORDER — ONDANSETRON HCL 4 MG PO TABS: 4.0000 mg | ORAL_TABLET | Freq: Four times a day (QID) | ORAL | Status: DC

## 2013-05-31 NOTE — ED Notes (Signed)
Pt c/o generalized abd pain and nausea x 2 days; pt sts hx of same

## 2013-05-31 NOTE — ED Provider Notes (Signed)
History    CSN: [id number] Arrival date & time 05/31/13  1240  First MD Initiated Contact with Patient 05/31/13 1421     Chief Complaint  Patient presents with  . Abdominal Pain  . Nausea   (Consider location/radiation/quality/duration/timing/severity/associated sxs/prior Treatment) The history is provided by the patient. No language interpreter was used.  Meredith [name] is a 62 y/o F with PMHx of HTN, DM< HLD, OA, iron deficiency anemia presenting to the ED with abdominal pain that started at 2:00AM this morning. Patient reported that the abdominal pain is a constant discomfort, described as a "twisting and letting go" sensation, with mild radiation to the lower back. Patient reported that she has been able to eat - stated that she ate oatmeal and toast this morning and was able to keep it down, along with her DM medications. Stated that she has not been taking anything for the pain. Patient reported that she has been feeling nausea, denied vomiting. Stated that she had discomfort while urinating, stated that she had a fullness of that she needed to go, but not necessarily. Patient reported that she had a BM today, brown, mildly loose - denied watery stools. Patient reported that she has tried to call her PCP, Dr. Elmarie Shiley - but, reported that the phone continue to be busy. Denied fever, chills, chest pain, shortness of breath, difficulty breathing, diarrhea, melena, hematochezia, difficulty swallowing.  PCP: Dr. Elmarie Shiley   Past Medical History  Diagnosis Date  . Diabetes mellitus   . Hypertension   . Hyperlipidemia   . Osteoarthritis   . ANEMIA, IRON DEFICIENCY, UNSPEC. 02/05/2007    Qualifier: Diagnosis of  By: Para March MD, Cheree Ditto     Past Surgical History  Procedure Laterality Date  . Tubal ligation    . Uterine fibroid surgery    . Operative hysteroscopy    . Abdominal hysterectomy     History reviewed. No pertinent family history. History  Substance Use Topics  . Smoking  status: Former Games developer  . Smokeless tobacco: Not on file  . Alcohol Use: No   OB History   Grav Para Term Preterm Abortions TAB SAB Ect Mult Living                 Review of Systems  Constitutional: Negative for fever and chills.  HENT: Negative for trouble swallowing and neck pain.   Eyes: Negative for visual disturbance.  Respiratory: Negative for cough, chest tightness and shortness of breath.   Gastrointestinal: Positive for nausea and abdominal pain. Negative for vomiting, diarrhea, constipation, blood in stool and anal bleeding.  Genitourinary: Positive for dysuria. Negative for pelvic pain.  Neurological: Negative for dizziness, weakness, light-headedness and numbness.  All other systems reviewed and are negative.    Allergies  Flexeril; Lipitor; and Metaxalone  Home Medications   Current Outpatient Rx  Name  Route  Sig  Dispense  Refill  . lisinopril-hydrochlorothiazide (ZESTORETIC) 20-12.5 MG per tablet   Oral   Take 2 tablets by mouth daily.   180 tablet   3   . metFORMIN (GLUCOPHAGE-XR) 500 MG 24 hr tablet   Oral   Take 1,000 mg by mouth 2 (two) times daily.         . pravastatin (PRAVACHOL) 80 MG tablet   Oral   Take 1 tablet (80 mg total) by mouth daily.   90 tablet   3   . traMADol (ULTRAM) 50 MG tablet   Oral   Take 50  mg by mouth every 6 (six) hours as needed for pain. Take 2 tablets by mouth every 8 hours as needed for pain.         Marland Kitchen HYDROcodone-acetaminophen (NORCO) 5-325 MG per tablet   Oral   Take 1 tablet by mouth every 8 (eight) hours as needed for pain.   4 tablet   0   . ondansetron (ZOFRAN) 4 MG tablet   Oral   Take 1 tablet (4 mg total) by mouth every 6 (six) hours.   12 tablet   0    BP 173/83  Pulse 67  Temp(Src) 98.4 F (36.9 C) (Oral)  Resp 18  SpO2 97% Physical Exam  Nursing note and vitals reviewed. Constitutional: She appears well-developed and well-nourished. No distress.  HENT:  Head: Normocephalic and  atraumatic.  Eyes: Conjunctivae and EOM are normal. Pupils are equal, round, and reactive to light. Right eye exhibits no discharge. Left eye exhibits no discharge.  Neck: Normal range of motion. Neck supple. No tracheal deviation present.  Cardiovascular: Normal rate, regular rhythm and normal heart sounds.  Exam reveals no friction rub.   No murmur heard. Pulses:      Radial pulses are 2+ on the right side, and 2+ on the left side.       Dorsalis pedis pulses are 2+ on the right side, and 2+ on the left side.  Pulmonary/Chest: Effort normal and breath sounds normal. No respiratory distress. She has no wheezes. She has no rales.  Abdominal: Soft. Bowel sounds are normal. She exhibits no distension. There is no hepatosplenomegaly. There is generalized tenderness. There is no rigidity, no rebound, no guarding, no CVA tenderness, no tenderness at McBurney's point and negative Murphy's sign.    Obese Negative obtruator and psoas Negative Murphy's sign  Lymphadenopathy:    She has no cervical adenopathy.  Neurological: She is alert. She exhibits normal muscle tone. Coordination normal.  Skin: Skin is warm and dry. She is not diaphoretic.  Psychiatric: She has a normal mood and affect. Her behavior is normal. Thought content normal.    ED Course  Procedures (including critical care time)  5:49PM Discussed lab findings with patient. Patient reported that abdominal pain has not improved, reported pain to be 8/10. Stated that she had two episodes of emesis, mainly of stomach contents, liquid. IV Dilaudid ordered with Zofran IV. Patient to be re-checked.   7:00PM Discussed lab findings with patient. Re-assessed patient - stated that pain has improved. Soft, non-distended abdomen, negative pain upon palpation.    Date: 05/31/2013  Rate: 66  Rhythm: normal sinus rhythm  QRS Axis: normal  Intervals: normal  ST/T Wave abnormalities: nonspecific ST/T changes  Conduction Disutrbances:none   Narrative Interpretation: ventricular premature complex noted, low voltage in frontal leads  Old EKG Reviewed: changes noted   Labs Reviewed  CBC WITH DIFFERENTIAL - Abnormal; Notable for the following:    RBC 5.12 (*)    MCV 75.2 (*)    All other components within normal limits  COMPREHENSIVE METABOLIC PANEL - Abnormal; Notable for the following:    Glucose, Bld 129 (*)    GFR calc non Af Amer 90 (*)    All other components within normal limits  LIPASE, BLOOD  URINALYSIS, ROUTINE W REFLEX MICROSCOPIC  GLUCOSE, CAPILLARY  CBC WITH DIFFERENTIAL  COMPREHENSIVE METABOLIC PANEL  POCT I-STAT TROPONIN I   Dg Abd Acute W/chest  05/31/2013   *RADIOLOGY REPORT*  Clinical Data: Severe lower abdominal pain  ACUTE  ABDOMEN SERIES (ABDOMEN 2 VIEW & CHEST 1 VIEW)  Comparison: CT abdomen/pelvis 11/28/2012; prior chest x-ray 12/13/2011  Findings: The lungs are clear.  No focal airspace consolidation, effusion or pneumothorax. Stable borderline cardiomegaly. Mediastinal contours are unchanged.  No free air on the upright views.  Nonobstructed bowel gas pattern. Gas and stool noted throughout the colon to the rectum.  No significant air-fluid levels on the upright view.  Visualized osseous structures demonstrate no acute fracture or malalignment. Lower lumbar degenerative disc disease and facet arthropathy.  IMPRESSION:  1.  No acute cardiopulmonary disease. 2.  Nonobstructed bowel gas pattern. 3.  Stable borderline cardiomegaly.   Original Report Authenticated By: Malachy Moan, M.D.   1. Abdominal pain   2. DM (diabetes mellitus)   3. HLD (hyperlipidemia)   4. OA (osteoarthritis)     MDM  Patient presenting to ED with generalized abdominal pain that started this morning at 2:00AM - reported mild nausea, denied vomiting, diarrhea, melena, hematochezia.  EKG and troponin negative findings - ruling out MI. Urine negative findings. CBC and CMP negative findings. Lipase negative. Abdomen xray - negative  obstruction noted, negative free air - negative findings, gas and stool noted to the colon to the rectum. Reviewed patient's chart - patient has a long history of abdominal pain - reported that symptoms of abdominal pain are the same - negative changes to abdominal pain presentation - patient reported that she does not follow a GI physician.  Patient stable, afebrile. Etiology of abdominal pain unknown, suspicion to be viral in nature with nausea, emesis, diarrhea, and abdominal pain. Discharged patient with antiemetics and small dose of pain medications. Referred patient to PCP and gastroenterology for further work-up. Discussed diet with patient. Discussed with patient to rest and stay hydrated. Discussed with patient to continue to monitor symptoms and if symptoms are to worsen or change to report back to the ED - strict return instructions given. Patient agreed to plan of care, understood, all questions answered.        Raymon Mutton, PA-C 05/31/13 2131

## 2013-05-31 NOTE — ED Notes (Signed)
Unable to give urine sample in triage 

## 2013-06-01 ENCOUNTER — Ambulatory Visit
Admission: RE | Admit: 2013-06-01 | Discharge: 2013-06-01 | Disposition: A | Discharge status: Home / Self Care | Payer: BC Managed Care – PPO | Source: Ambulatory Visit

## 2013-06-01 DIAGNOSIS — Z1231 Encounter for screening mammogram for malignant neoplasm of breast: Secondary | ICD-10-CM

## 2013-06-02 NOTE — ED Provider Notes (Signed)
Medical screening examination/treatment/procedure(s) were conducted as a shared visit with non-physician practitioner(s) and myself.  I personally evaluated the patient during the encounter Pt with hx recurrent abd pain, states is having same pain. No fever or chills. Labs. Iv, pain rx. abd soft nt.   Suzi Roots, MD 06/02/13 [phone]

## 2013-06-07 ENCOUNTER — Telehealth: Payer: Self-pay | Admitting: Family Medicine

## 2013-06-10 ENCOUNTER — Encounter: Payer: Self-pay | Admitting: *Deleted

## 2013-06-17 ENCOUNTER — Telehealth: Payer: Self-pay | Admitting: Family Medicine

## 2013-06-17 ENCOUNTER — Other Ambulatory Visit: Payer: Self-pay

## 2013-06-17 MED ORDER — TOPIRAMATE POWD: 3.5000 g | Status: DC | PRN

## 2013-06-17 NOTE — Telephone Encounter (Signed)
Rx for neuropathy cream faxed back to [facility] for topical.  [name] R. Paulina Fusi, DO of [facility] 06/17/2013, 8:45 AM

## 2013-06-23 NOTE — Telephone Encounter (Signed)
error 

## 2013-06-30 ENCOUNTER — Emergency Department (HOSPITAL_COMMUNITY): Payer: BC Managed Care – PPO

## 2013-06-30 ENCOUNTER — Emergency Department (HOSPITAL_COMMUNITY)
Admission: EM | Admit: 2013-06-30 | Discharge: 2013-06-30 | Disposition: A | Discharge status: Home / Self Care | Payer: BC Managed Care – PPO | Attending: Emergency Medicine | Admitting: Emergency Medicine

## 2013-06-30 DIAGNOSIS — R109 Unspecified abdominal pain: Secondary | ICD-10-CM | POA: Insufficient documentation

## 2013-06-30 DIAGNOSIS — R112 Nausea with vomiting, unspecified: Secondary | ICD-10-CM | POA: Insufficient documentation

## 2013-06-30 DIAGNOSIS — G8929 Other chronic pain: Secondary | ICD-10-CM | POA: Insufficient documentation

## 2013-06-30 DIAGNOSIS — E119 Type 2 diabetes mellitus without complications: Secondary | ICD-10-CM | POA: Insufficient documentation

## 2013-06-30 DIAGNOSIS — I10 Essential (primary) hypertension: Secondary | ICD-10-CM | POA: Insufficient documentation

## 2013-06-30 DIAGNOSIS — Z79899 Other long term (current) drug therapy: Secondary | ICD-10-CM | POA: Insufficient documentation

## 2013-06-30 DIAGNOSIS — Z8739 Personal history of other diseases of the musculoskeletal system and connective tissue: Secondary | ICD-10-CM | POA: Insufficient documentation

## 2013-06-30 DIAGNOSIS — Z87448 Personal history of other diseases of urinary system: Secondary | ICD-10-CM | POA: Insufficient documentation

## 2013-06-30 DIAGNOSIS — Z87891 Personal history of nicotine dependence: Secondary | ICD-10-CM | POA: Insufficient documentation

## 2013-06-30 DIAGNOSIS — Z862 Personal history of diseases of the blood and blood-forming organs and certain disorders involving the immune mechanism: Secondary | ICD-10-CM | POA: Insufficient documentation

## 2013-06-30 DIAGNOSIS — Z8719 Personal history of other diseases of the digestive system: Secondary | ICD-10-CM | POA: Insufficient documentation

## 2013-06-30 LAB — COMPREHENSIVE METABOLIC PANEL
ALT: 8 U/L (ref 0–35)
AST: 11 U/L (ref 0–37)
Albumin: 3.5 g/dL (ref 3.5–5.2)
Alkaline Phosphatase: 46 U/L (ref 39–117)
Potassium: 3.3 mEq/L — ABNORMAL LOW (ref 3.5–5.1)
Sodium: 142 mEq/L (ref 135–145)
Total Protein: 7.1 g/dL (ref 6.0–8.3)

## 2013-06-30 LAB — CBC WITH DIFFERENTIAL/PLATELET
Basophils Relative: 0 % (ref 0–1)
Eosinophils Absolute: 0.1 10*3/uL (ref 0.0–0.7)
Eosinophils Relative: 1 % (ref 0–5)
HCT: 36.8 % (ref 36.0–46.0)
Hemoglobin: 13.1 g/dL (ref 12.0–15.0)
MCH: 26.7 pg (ref 26.0–34.0)
MCHC: 35.6 g/dL (ref 30.0–36.0)
MCV: 74.9 fL — ABNORMAL LOW (ref 78.0–100.0)
Monocytes Absolute: 0.5 10*3/uL (ref 0.1–1.0)
Neutro Abs: 8.5 10*3/uL — ABNORMAL HIGH (ref 1.7–7.7)
RBC: 4.91 MIL/uL (ref 3.87–5.11)

## 2013-06-30 LAB — URINALYSIS, ROUTINE W REFLEX MICROSCOPIC
Bilirubin Urine: NEGATIVE
Glucose, UA: NEGATIVE mg/dL
Hgb urine dipstick: NEGATIVE
Ketones, ur: NEGATIVE mg/dL
Nitrite: NEGATIVE
pH: 6.5 (ref 5.0–8.0)

## 2013-06-30 MED ORDER — DICYCLOMINE HCL 10 MG PO CAPS
10.0000 mg | ORAL_CAPSULE | Freq: Once | ORAL | Status: AC
  Administered 2013-06-30: 10 mg via ORAL
  Filled 2013-06-30: qty 1

## 2013-06-30 MED ORDER — HYDROMORPHONE HCL PF 1 MG/ML IJ SOLN
1.0000 mg | Freq: Once | INTRAMUSCULAR | Status: AC
Start: 2013-06-30 — End: 2013-06-30
  Administered 2013-06-30: 1 mg via INTRAVENOUS
  Filled 2013-06-30: qty 1

## 2013-06-30 MED ORDER — DICYCLOMINE HCL 20 MG PO TABS: 20.0000 mg | ORAL_TABLET | Freq: Two times a day (BID) | ORAL | Status: DC

## 2013-06-30 MED ORDER — HYDROMORPHONE HCL PF 1 MG/ML IJ SOLN
0.5000 mg | INTRAMUSCULAR | Status: AC
  Administered 2013-06-30: 0.5 mg via INTRAVENOUS
  Filled 2013-06-30: qty 1

## 2013-06-30 NOTE — ED Provider Notes (Signed)
History    CSN: [id number] Arrival date & time 06/30/13  0103  None    Chief Complaint  Patient presents with  . Abdominal Pain    HPI Meredith [name] is a 62 y.o. female presents with acute on chronic abdominal pain. Patient has had multiple visits to the emergency department for the same pain, she says this is a "knot-like pain", 10 out of 10, intermittent, associated with nausea and vomiting x2, she says it "tries to go away" and has sharp recurrences then lingers on it gets better.  She denies any diarrhea, hematochezia or melena. She's had nausea and vomiting, and has vomited up the Zofran she has. She is trying to not take pain medicine. She has a history of constipation but not recently. Her pain is usually related to eating and usually occurs after eating. Patient last had a colonoscopy by Dr. Juanda Chance for years ago, at that time however she did not have any abdominal pain such as this. This pain has been present for at least a year, she does have a followup with Dr. Juanda Chance on August 1.   Past Medical History  Diagnosis Date  . Diabetes mellitus   . Hypertension   . Hyperlipidemia   . Osteoarthritis   . ANEMIA, IRON DEFICIENCY, UNSPEC. 02/05/2007    Qualifier: Diagnosis of  By: Para March MD, Cheree Ditto    . Ventral hernia   . Enteritis   . DJD (degenerative joint disease) of lumbar spine   . Diverticulosis 05/17/12  . Renal cyst, left 05/17/12  . Gastric ulcer 04/2000   Past Surgical History  Procedure Laterality Date  . Tubal ligation    . Uterine fibroid surgery    . Operative hysteroscopy    . Abdominal hysterectomy     Family History  Problem Relation Age of Onset  . Diabetes Mother   . Kidney disease Mother   . Lung cancer Father   . Stomach cancer Sister   . Colon cancer Neg Hx   . Clotting disorder Other     neice   History  Substance Use Topics  . Smoking status: Former Games developer  . Smokeless tobacco: Not on file  . Alcohol Use: No   OB History   Grav Para Term  Preterm Abortions TAB SAB Ect Mult Living                 Review of Systems At least 10pt or greater review of systems completed and are negative except where specified in the HPI.  Allergies  Flexeril; Lipitor; and Metaxalone  Home Medications   Current Outpatient Rx  Name  Route  Sig  Dispense  Refill  . HYDROcodone-acetaminophen (NORCO) 5-325 MG per tablet   Oral   Take 1 tablet by mouth every 8 (eight) hours as needed for pain.   4 tablet   0   . lisinopril-hydrochlorothiazide (ZESTORETIC) 20-12.5 MG per tablet   Oral   Take 2 tablets by mouth daily.   180 tablet   3   . metFORMIN (GLUCOPHAGE-XR) 500 MG 24 hr tablet   Oral   Take 1,000 mg by mouth 2 (two) times daily.         . ondansetron (ZOFRAN) 4 MG tablet   Oral   Take 1 tablet (4 mg total) by mouth every 6 (six) hours.   12 tablet   0   . pravastatin (PRAVACHOL) 80 MG tablet   Oral   Take 1 tablet (80 mg total)  by mouth daily.   90 tablet   3   . Topiramate POWD   Topical   Apply 3.5 g topically as needed.   100 g   5   . traMADol (ULTRAM) 50 MG tablet   Oral   Take 50 mg by mouth every 6 (six) hours as needed for pain. Take 2 tablets by mouth every 8 hours as needed for pain.          BP 137/64  Pulse 77  Temp(Src) 97.9 F (36.6 C) (Oral)  Resp 18  SpO2 94% Physical Exam  Nursing notes reviewed.  Electronic medical record reviewed. VITAL SIGNS:   Filed Vitals:   06/30/13 0130 06/30/13 0200 06/30/13 0245 06/30/13 0637  BP: 123/64 138/67 118/56 128/62  Pulse: 73 72 68 62  Temp:      TempSrc:      Resp:    14  SpO2: 95% 95% 93% 93%   CONSTITUTIONAL: Awake, oriented, appears non-toxic HENT: Atraumatic, normocephalic, oral mucosa pink and moist, airway patent. Nares patent without drainage. External ears normal. EYES: Conjunctiva clear, EOMI, PERRLA NECK: Trachea midline, non-tender, supple CARDIOVASCULAR: Normal heart rate, Normal rhythm, No murmurs, rubs,  gallops PULMONARY/CHEST: Clear to auscultation, no rhonchi, wheezes, or rales. Symmetrical breath sounds. Non-tender. ABDOMINAL: Non-distended, morbidly obese, soft, mildly tender to palpation superior to the umbilicus in the 10:00 and 2:00 positions no left lower quadrant, right lower quadrant, right upper quadrant or left upper quadrant pain.  BS normal. NEUROLOGIC: Non-focal, moving all four extremities, no gross sensory or motor deficits. EXTREMITIES: No clubbing, cyanosis, or edema SKIN: Warm, Dry, No erythema, No rash  ED Course  Procedures (including critical care time) Labs Reviewed  COMPREHENSIVE METABOLIC PANEL - Abnormal; Notable for the following:    Potassium 3.3 (*)    Glucose, Bld 172 (*)    All other components within normal limits  CBC WITH DIFFERENTIAL - Abnormal; Notable for the following:    WBC 10.8 (*)    MCV 74.9 (*)    Neutrophils Relative % 78 (*)    Neutro Abs 8.5 (*)    All other components within normal limits  LIPASE, BLOOD  URINALYSIS, ROUTINE W REFLEX MICROSCOPIC  CG4 I-STAT (LACTIC ACID)   Dg Abd 2 Views  06/30/2013   *RADIOLOGY REPORT*  Clinical Data: Abdominal pain and nausea and vomiting for weeks. History ileitis and chronic abdominal pain.  ABDOMEN - 2 VIEW  Comparison: 05/31/2013  Findings: Scattered gas and stool throughout the colon.  No small or large bowel distension.  Bowel loop likely representing descending colon demonstrates evidence of wall thickening.  This suggest focal colitis.  No free intra-abdominal air.  No abnormal air fluid levels.  No radiopaque stones.  Calcified phleboliths in the pelvis.  Surgical clip in the pelvis.  Degenerative changes in the spine and hips.  IMPRESSION: Nonobstructive bowel gas pattern.  Focal bowel loop in the left lower quadrant likely representing descending colon demonstrates wall thickening suggesting focal colitis.   Original Report Authenticated By: Burman Nieves, M.D.   1. Lower abdominal pain,  unspecified laterality   2. Nausea and vomiting     Medications  HYDROmorphone (DILAUDID) injection 1 mg (1 mg Intravenous Given 06/30/13 0158)  HYDROmorphone (DILAUDID) injection 0.5 mg (0.5 mg Intravenous Given 06/30/13 0525)  dicyclomine (BENTYL) capsule 10 mg (10 mg Oral Given 06/30/13 0524)    MDM   BARBAR BREDE is a 62 y.o. female presenting with cramping-type sharp intermittent abdominal pain  that she has increased since last December on review of her records. Last December, CT of the abdomen and pelvis showed some focal inflammation in the ileum, either transient or related to Crohn's disease-diagnosis unknown as the patient has not followed up with GI.  She's had nonobstructive patterns of abdominal pain since then.  She is nontoxic, her abdomen is nonsurgical at this time, it is tender to palpation, basic labs are quite unremarkable, there are no elevations in white blood count, liver enzymes, no evidence for urinary tract infection, intestinal obstruction, mesenteric ischemia, as her lactate is within normal. X-ray of the abdomen and pelvis shows a nonspecific nonobstructive bowel gas pattern however the radiologist does suggest there is a focal bowel loop in the left lower quadrant which could represent descending colitis. With this patient's history of very inflammation in the abdomen, I have discussed need for followup and how important it is.  I do not think a CT of the abdomen and pelvis we'll add on any further clinical information. The patient is almost pain-free at this time she feels much better after medications in the ER which included Bentyl. I do not think this abdominal pain is acute coronary syndrome, she says this pain is exactly the same as her prior exacerbations in the past.  Discussed with GI on call for Dr. Juanda Chance, suggested anti-spasmodic such as Bentyl and followup.  Patient's abdomen is nonsurgical, labs are unremarkable, do not think she has an obstruction,  abscess, diverticulitis, gastritis, gastroenteritis, pancreatitis peritonitis, intestinal ischemia, urinary tract infection, perforated viscus, ruptured spleen, AAA, DKA, thalassemia, uremia, seriously doubt an infectious cause such as parasites.  I think likely higher on the differential diagnosis, Crohn's disease, irritable bowel syndrome or other functional problem.  Patient agrees and understands the medical plan, prescriptions for Bentyl have been given to the patient and I called in a prescription for Norco as well as GlycoLax for occasional constipation. I told her that Norco will likely exacerbate the problem and only to use it in cases of severe pain.  Patient to followup with Dr. Juanda Chance on August 1.  I have answered patient's questions to her satisfaction, is discharged home stable and in good condition with appropriate medications.  Jones Skene, MD 06/30/13 [phone]

## 2013-06-30 NOTE — ED Notes (Signed)
Per EMS: Pt reports generalized 10/10 abdominal pain with n/v x weeks with acute episode of vomiting starting at 2000 tonight. Pt given 4 mg Zofran en route.  Pt has follow-up with GI specialist August 1st for some complaint. 148/98. 80 SR. CBG 135.

## 2013-06-30 NOTE — ED Notes (Signed)
MD Bonk at bedside.  

## 2013-07-09 ENCOUNTER — Encounter: Payer: Self-pay | Admitting: Internal Medicine

## 2013-07-09 ENCOUNTER — Ambulatory Visit (INDEPENDENT_AMBULATORY_CARE_PROVIDER_SITE_OTHER): Payer: BC Managed Care – PPO | Admitting: Internal Medicine

## 2013-07-09 VITALS — BP 182/108 | HR 76 | Ht 62.25 in | Wt 246.5 lb

## 2013-07-09 DIAGNOSIS — R1031 Right lower quadrant pain: Secondary | ICD-10-CM

## 2013-07-09 DIAGNOSIS — R933 Abnormal findings on diagnostic imaging of other parts of digestive tract: Secondary | ICD-10-CM

## 2013-07-09 NOTE — Progress Notes (Signed)
Meredith [name] Feb 17, 1951 MRN [id number]  History of Present Illness:  This is a 62 year old [name] with episodes of severe lower abdominal pain recently evaluated in the emergency room. She also has a history of iron deficiency anemia while she was on Coumadin in 2001. An Acute episode occurred several weeks ago and a CT scan of the abdomen showed circumferential bowel wall thickening in the mid to distal ileum similar to CT scans in November 2011, June 2013 and in December 2013 suggestive of Crohn's disease.. She had a colonoscopy in 2001 and again in June 2011 which showed rectal polyps consisting of polypoid mucosa. She has a history of an anal fissure. Her upper endoscopy in 2001 showed gastritis. She is currently not having any problems. Her bowel habits are regular. She is overweight. She denies nausea, vomiting, fever or weight loss.   Past Medical History  Diagnosis Date  . Diabetes mellitus   . Hypertension   . Hyperlipidemia   . Osteoarthritis   . ANEMIA, IRON DEFICIENCY, UNSPEC. 02/05/2007    Qualifier: Diagnosis of  By: Para March MD, Cheree Ditto    . Ventral hernia   . Enteritis   . DJD (degenerative joint disease) of lumbar spine   . Diverticulosis 05/17/12  . Renal cyst, left 05/17/12  . Gastric ulcer 04/2000  . Colon polyps    Past Surgical History  Procedure Laterality Date  . Tubal ligation    . Uterine fibroid surgery    . Operative hysteroscopy    . Abdominal hysterectomy      reports that she has quit smoking. Her smoking use included Cigarettes. She smoked 0.00 packs per day. She has never used smokeless tobacco. She reports that she does not drink alcohol or use illicit drugs. family history includes Clotting disorder in her other; Diabetes in her mother; Kidney disease in her mother; Lung cancer in her father; and Stomach cancer in her sister.  There is no history of Colon cancer. Allergies  Allergen Reactions  . Flexeril (Cyclobenzaprine Hcl)    unknown  . Lipitor (Atorvastatin Calcium)     Myalgias  . Metaxalone         Review of Systems: Denies dysphagia heartburn nausea vomiting  The remainder of the 10 point ROS is negative except as outlined in H&P   Physical Exam: General appearance  Well developed, in no distress. Overweight Eyes- non icteric. HEENT nontraumatic, normocephalic. Mouth no lesions, tongue papillated, no cheilosis. Neck supple without adenopathy, thyroid not enlarged, no carotid bruits, no JVD. Lungs Clear to auscultation bilaterally. Cor normal S1, normal S2, regular rhythm, no murmur,  quiet precordium. Abdomen: Obese soft nontender with normoactive bowel sounds. No distention no palpable mass. Rectal: Soft Hemoccult negative stool. Extremities no pedal edema. Osteoarthritis of both knees. Skin no lesions. Neurological alert and oriented x 3. Psychological normal mood and affect.  Assessment and Plan:  Problem #55 62 year old [name] with abnormal CT scans of the distal small bowel consistent with  Crohn's disease of the distal ileum. The abnormality has shown on CT scan from 2011, 2013 and recently. She comes to the emergency room periodically with what sounds like a partial small bowel obstruction. She is currently asymptomatic. We will obtain IBD markers as well as sedimentation rate. We will also obtain a small bowel capsule endoscopy to look for Crohn's disease in the small intestine.   07/09/2013 Meredith [name]

## 2013-07-09 NOTE — Patient Instructions (Addendum)
Your physician has requested that you go to the basement for the following lab work before leaving today: IBD expanded panel, Sed Rate  You have been scheduled for a small bowel capsule endoscopy. Please follow written instructions given to you at your office visit.  Cc: Dr Gildardo Cranker

## 2013-07-15 ENCOUNTER — Telehealth: Payer: Self-pay | Admitting: Internal Medicine

## 2013-07-15 NOTE — Telephone Encounter (Signed)
Clarified with patient that she is to mix Miralax 7 capfuls in [place] aid at 6 PM.

## 2013-07-16 ENCOUNTER — Ambulatory Visit (INDEPENDENT_AMBULATORY_CARE_PROVIDER_SITE_OTHER): Payer: BC Managed Care – PPO | Admitting: Internal Medicine

## 2013-07-16 DIAGNOSIS — K50018 Crohn's disease of small intestine with other complication: Secondary | ICD-10-CM

## 2013-07-16 DIAGNOSIS — K5000 Crohn's disease of small intestine without complications: Secondary | ICD-10-CM

## 2013-07-16 NOTE — Progress Notes (Signed)
Pt in at 0757am this morning ans she states she completed the prep as ordered and she has been NPO since last night. Pt swallowed the capsule w/o difficulty and stayed here lying on her side for 30 minutes and no problem was observed with pt keeping the capsule; no signs of nausea. Pt was given instructions for diet, med administration and care of the capsule during the day and she was sent home at 0830am. Capsule   LOT 2014 - 14/24899S  Pt back at 4pm and equipment was removed. Pt did not report any problems and was sent home. Instructed her she should pass the capsule within 7 days and it may take 2 weeks for results. Pt stated understanding.

## 2013-07-23 ENCOUNTER — Ambulatory Visit (INDEPENDENT_AMBULATORY_CARE_PROVIDER_SITE_OTHER): Payer: BC Managed Care – PPO | Admitting: Family Medicine

## 2013-07-23 ENCOUNTER — Encounter: Payer: Self-pay | Admitting: Family Medicine

## 2013-07-23 VITALS — BP 149/82 | HR 77 | Ht 63.0 in | Wt 242.0 lb

## 2013-07-23 DIAGNOSIS — I10 Essential (primary) hypertension: Secondary | ICD-10-CM

## 2013-07-23 DIAGNOSIS — Z0000 Encounter for general adult medical examination without abnormal findings: Secondary | ICD-10-CM

## 2013-07-23 DIAGNOSIS — E119 Type 2 diabetes mellitus without complications: Secondary | ICD-10-CM

## 2013-07-23 DIAGNOSIS — E7800 Pure hypercholesterolemia, unspecified: Secondary | ICD-10-CM

## 2013-07-23 DIAGNOSIS — F339 Major depressive disorder, recurrent, unspecified: Secondary | ICD-10-CM

## 2013-07-23 NOTE — Assessment & Plan Note (Signed)
Pt BP continues to be elevated, last BMP done on 7/23 showing stable creatinine of 0.66.  Pt states she is compliant with her medication, and with further review looks as if her BP has been elevated for quite some time despite increase in her Zestoretic to 40/25 in January 2014.  Will recheck again in three months and if >140/90, will consider adding norvasc vs compliance issue.  At that time, also consider repeat BMP if adding another agent.

## 2013-07-23 NOTE — Progress Notes (Signed)
Meredith [name] is a 62 y.o. who presents today for generalized physical.  She has no current complaints and has significant PMHx of HTN, HLD, DM II controlled, OA, chronic gastric dyspepsia.    HTN - Elevated, compliant with medications, no edema, cough, lightheaded, palpitations.   HLD - COmpliant with pravachol, no myalgias   DM II - controlled, compliant with Metformin.  No N/V, hypoglycemic Sx.    Past Medical History  Diagnosis Date  . Diabetes mellitus   . Hypertension   . Hyperlipidemia   . Osteoarthritis   . ANEMIA, IRON DEFICIENCY, UNSPEC. 02/05/2007    Qualifier: Diagnosis of  By: Para March MD, Cheree Ditto    . Ventral hernia   . Enteritis   . DJD (degenerative joint disease) of lumbar spine   . Diverticulosis 05/17/12  . Renal cyst, left 05/17/12  . Gastric ulcer 04/2000  . Colon polyps     History   Social History  . Marital Status: Divorced    Spouse Name: N/A    Number of Children: 4  . Years of Education: N/A   Occupational History  . [employer]    Social History Main Topics  . Smoking status: Former Smoker    Types: Cigarettes  . Smokeless tobacco: Never Used  . Alcohol Use: No  . Drug Use: No  . Sexual Activity: Not Currently   Other Topics Concern  . Not on file   Social History Narrative  . No narrative on file    Family History  Problem Relation Age of Onset  . Diabetes Mother   . Kidney disease Mother   . Lung cancer Father   . Stomach cancer Sister   . Colon cancer Neg Hx   . Clotting disorder Other     neice    Current Outpatient Prescriptions on File Prior to Visit  Medication Sig Dispense Refill  . dicyclomine (BENTYL) 20 MG tablet Take 1 tablet (20 mg total) by mouth 2 (two) times daily.  20 tablet  0  . HYDROcodone-acetaminophen (NORCO) 5-325 MG per tablet Take 1 tablet by mouth every 8 (eight) hours as needed for pain.  4 tablet  0  . lisinopril-hydrochlorothiazide (ZESTORETIC) 20-12.5 MG per tablet Take 2 tablets by mouth daily.  180 tablet   3  . metFORMIN (GLUCOPHAGE-XR) 500 MG 24 hr tablet Take 1,000 mg by mouth 2 (two) times daily.      . ondansetron (ZOFRAN) 4 MG tablet Take 1 tablet (4 mg total) by mouth every 6 (six) hours.  12 tablet  0  . pravastatin (PRAVACHOL) 80 MG tablet Take 1 tablet (80 mg total) by mouth daily.  90 tablet  3  . Topiramate POWD Apply 3.5 g topically as needed.  100 g  5  . traMADol (ULTRAM) 50 MG tablet Take 50 mg by mouth every 6 (six) hours as needed for pain.        No current facility-administered medications on file prior to visit.    Patient Information Form: Screening and ROS  AUDIT-C Score: 3 Do you feel safe in relationships? yes PHQ-2:positive  Review of Symptoms  General:  Negative for nexplained weight loss, fever Skin: Negative for new or changing mole, sore that won't heal HEENT: + for trouble hearing, + trouble seeing , + ringing in ears, negative for mouth sores, hoarseness, change in voice, dysphagia. CV:  Negative for chest pain, dyspnea, edema, palpitations Resp: Negative for cough, dyspnea, hemoptysis GI: + for nausea, - vomiting, -  diarrhea, + constipation, + abdominal pain, Neg for melena, hematochezia. GU: Negative for dysuria, incontinence, urinary hesitance, hematuria, vaginal or penile discharge, polyuria, sexual difficulty, lumps in testicle or breasts MSK: Negative for muscle cramps or aches, joint pain or swelling Neuro: Negative for headaches, weakness,+ for numbness, + dizziness, - passing out/fainting Psych: Negative for depression, anxiety, memory problems  Physical Exam Filed Vitals:   07/23/13 0913  BP: 149/82  Pulse: 77    Gen: NAD, Well nourished, Well developed HEENT: PERLA, EOMI, [place]/AT Neck: no JVD Cardio: RRR, No murmurs/gallops/rubs Lungs: CTA, no wheezes, rhonchi, crackles Abd: NABS, soft nontender nondistended MSK: ROM normal  Neuro: CN 2-12 intact, MS 5/5 B/L UE and LE, +2 patellar and achilles relfex b/l  Psych: AAO x 3      Chemistry      Component Value Date/Time   NA 142 06/30/2013 0140   K 3.3* 06/30/2013 0140   CL 103 06/30/2013 0140   CO2 29 06/30/2013 0140   BUN 18 06/30/2013 0140   CREATININE 0.66 06/30/2013 0140   CREATININE 0.66 12/25/2012 0929      Component Value Date/Time   CALCIUM 9.7 06/30/2013 0140   ALKPHOS 46 06/30/2013 0140   AST 11 06/30/2013 0140   ALT 8 06/30/2013 0140   BILITOT 0.4 06/30/2013 0140      Lab Results  Component Value Date   WBC 10.8* 06/30/2013   HGB 13.1 06/30/2013   HCT 36.8 06/30/2013   MCV 74.9* 06/30/2013   PLT 167 06/30/2013    Lab Results  Component Value Date   HGBA1C 6.6 07/23/2013

## 2013-07-23 NOTE — Assessment & Plan Note (Signed)
Screen positive on PHQ - 2.  Pt may need repeat PHQ 9 and w/u for other causes of her depression/starting on SSRI or referral to psychiatry, will consider at next visit.

## 2013-07-23 NOTE — Assessment & Plan Note (Signed)
A1C stable today at 6.6 on Metformin 500 XR 1000 mg BID.  Continue with current medication and recheck A1C in 3 months.

## 2013-07-23 NOTE — Assessment & Plan Note (Addendum)
Continues on Pravachol 80 mg qd.  Consider Direct LDL at next visit in three months as she does have DM II and risk > 7.5% on ASCVD so needs high dose statin but cannot afford Crestor/intolerant of lipitor

## 2013-07-26 ENCOUNTER — Other Ambulatory Visit: Payer: Self-pay | Admitting: *Deleted

## 2013-07-26 MED ORDER — DICYCLOMINE HCL 20 MG PO TABS: 20.0000 mg | ORAL_TABLET | Freq: Two times a day (BID) | ORAL | Status: DC

## 2013-08-12 ENCOUNTER — Ambulatory Visit (INDEPENDENT_AMBULATORY_CARE_PROVIDER_SITE_OTHER): Payer: BC Managed Care – PPO | Admitting: Family Medicine

## 2013-08-12 ENCOUNTER — Encounter: Payer: Self-pay | Admitting: Family Medicine

## 2013-08-12 VITALS — BP 175/97 | HR 114 | Ht 63.0 in | Wt 242.0 lb

## 2013-08-12 DIAGNOSIS — I10 Essential (primary) hypertension: Secondary | ICD-10-CM

## 2013-08-12 DIAGNOSIS — M545 Low back pain: Secondary | ICD-10-CM

## 2013-08-12 MED ORDER — IBUPROFEN 600 MG PO TABS: 600.0000 mg | ORAL_TABLET | Freq: Four times a day (QID) | ORAL | Status: DC | PRN

## 2013-08-12 MED ORDER — TRAMADOL HCL 50 MG PO TABS: 50.0000 mg | ORAL_TABLET | Freq: Four times a day (QID) | ORAL | Status: DC | PRN

## 2013-08-12 NOTE — Patient Instructions (Addendum)
Follow up in 10 days to 2 weeks with your primary care doctor: Dr. Paulina Fusi.  If you don't get any better with the medicine, come back earlier.  If you start having new weakness in your legs, if you have incontinence of urine or bowel, come back to the clinic.

## 2013-08-15 DIAGNOSIS — M545 Low back pain: Secondary | ICD-10-CM | POA: Insufficient documentation

## 2013-08-15 NOTE — Assessment & Plan Note (Signed)
Elevated today, but patient had not taken BP meds in am.  Encouraged follow up with PCP.

## 2013-08-15 NOTE — Assessment & Plan Note (Signed)
Acute back pain likely from muscle sprain. No red flags on history or exam.  Treat with ibuprofen 600mg  q6/prn with food and tramadol if needed.  Low back strenthening exercises given.  Return to clinic for follow up with PCP in 2 weeks.

## 2013-08-15 NOTE — Progress Notes (Signed)
Patient ID: Meredith [name]    DOB: 1950-12-13, 62 y.o.   MRN: [id number] --- Subjective:  Meredith [name] is a 62 y.o.female with h/o HTN who presents with acute low back pain.  - present for 4 days. Started on Saturday prior to clinic visit. She was lifting and rearranging furniture and felt pull and strain on back. Pain is located across lower back. It is an achy type pain, worst with sitting for long periods of time or standing. Better with walking. She tried taking ibuprofen 2 tabs without relief. She ran out of tramadol that she was taking for her knees. She denies any lower extremity weakness. No urinary or fecal incontinence.   ROS: see HPI Past Medical History: reviewed and updated medications and allergies. Social History: Tobacco: not currentl  Objective: Filed Vitals:   08/12/13 1052  BP: 175/97  Pulse: 114    Physical Examination:   General appearance - alert, well appearing, and in no distress Chest - clear to auscultation, no wheezes or crackles Heart - normal rate, regular rhythm, normal S1, S2, no murmurs, rubs, clicks or gallops MSK - no point tenderness along spine, tenderness along left SI joint, normal range of motion of hip, 4+/5 strength with hip flexion, knee extension, knee flexion bilaterally Unable to assess straight leg due to pain in knees.

## 2013-09-05 ENCOUNTER — Emergency Department (HOSPITAL_COMMUNITY)
Admission: EM | Admit: 2013-09-05 | Discharge: 2013-09-05 | Disposition: A | Discharge status: Home / Self Care | Payer: BC Managed Care – PPO | Attending: Emergency Medicine | Admitting: Emergency Medicine

## 2013-09-05 ENCOUNTER — Encounter (HOSPITAL_COMMUNITY): Payer: Self-pay | Admitting: Nurse Practitioner

## 2013-09-05 DIAGNOSIS — Z860100 Personal history of colon polyps, unspecified: Secondary | ICD-10-CM | POA: Insufficient documentation

## 2013-09-05 DIAGNOSIS — E119 Type 2 diabetes mellitus without complications: Secondary | ICD-10-CM | POA: Insufficient documentation

## 2013-09-05 DIAGNOSIS — M545 Low back pain, unspecified: Secondary | ICD-10-CM | POA: Insufficient documentation

## 2013-09-05 DIAGNOSIS — Z79899 Other long term (current) drug therapy: Secondary | ICD-10-CM | POA: Insufficient documentation

## 2013-09-05 DIAGNOSIS — M549 Dorsalgia, unspecified: Secondary | ICD-10-CM

## 2013-09-05 DIAGNOSIS — Z87891 Personal history of nicotine dependence: Secondary | ICD-10-CM | POA: Insufficient documentation

## 2013-09-05 DIAGNOSIS — IMO0001 Reserved for inherently not codable concepts without codable children: Secondary | ICD-10-CM | POA: Insufficient documentation

## 2013-09-05 DIAGNOSIS — M5380 Other specified dorsopathies, site unspecified: Secondary | ICD-10-CM | POA: Insufficient documentation

## 2013-09-05 DIAGNOSIS — I10 Essential (primary) hypertension: Secondary | ICD-10-CM | POA: Insufficient documentation

## 2013-09-05 DIAGNOSIS — Z862 Personal history of diseases of the blood and blood-forming organs and certain disorders involving the immune mechanism: Secondary | ICD-10-CM | POA: Insufficient documentation

## 2013-09-05 DIAGNOSIS — M62830 Muscle spasm of back: Secondary | ICD-10-CM

## 2013-09-05 DIAGNOSIS — E785 Hyperlipidemia, unspecified: Secondary | ICD-10-CM | POA: Insufficient documentation

## 2013-09-05 DIAGNOSIS — Z8711 Personal history of peptic ulcer disease: Secondary | ICD-10-CM | POA: Insufficient documentation

## 2013-09-05 DIAGNOSIS — M1990 Unspecified osteoarthritis, unspecified site: Secondary | ICD-10-CM | POA: Insufficient documentation

## 2013-09-05 MED ORDER — DIAZEPAM 5 MG PO TABS: 5.0000 mg | ORAL_TABLET | Freq: Two times a day (BID) | ORAL | Status: DC

## 2013-09-05 MED ORDER — PREDNISONE 20 MG PO TABS: 40.0000 mg | ORAL_TABLET | Freq: Every day | ORAL | Status: DC

## 2013-09-05 NOTE — ED Provider Notes (Signed)
CSN: [id number]     Arrival date & time 09/05/13  1013 History  This chart was scribed for non-physician practitioner, Francee Piccolo, PA-C working with Joya Gaskins, MD by Joaquin Music, ED Scribe. This patient was seen in room TR09C/TR09C and the patient's care was started at 11:47 AM   Chief Complaint  Patient presents with  . Back Pain   The history is provided by the patient. No language interpreter was used.   HPI Comments: Meredith [name] is a 62 y.o. female who presents to the Emergency Department complaining of worsening moderate to severe lower back pain onset 3 weeks. She describes her pain as "tightness" without radiation. She states she may been doing too much at home. Pt took Tramadol and took a hot shower with mild relief PTA, but denies any other alleviating factors.  Pt denies bowel and bladder incontinence. Pt denies history of cancers and drug or IVDA.  Pt denies recent falls or other trauma.  Denies fevers or chills.  Past Medical History  Diagnosis Date  . Diabetes mellitus   . Hypertension   . Hyperlipidemia   . Osteoarthritis   . ANEMIA, IRON DEFICIENCY, UNSPEC. 02/05/2007    Qualifier: Diagnosis of  By: Para March MD, Cheree Ditto    . Ventral hernia   . Enteritis   . DJD (degenerative joint disease) of lumbar spine   . Diverticulosis 05/17/12  . Renal cyst, left 05/17/12  . Gastric ulcer 04/2000  . Colon polyps    Past Surgical History  Procedure Laterality Date  . Tubal ligation    . Uterine fibroid surgery    . Operative hysteroscopy    . Abdominal hysterectomy     Family History  Problem Relation Age of Onset  . Diabetes Mother   . Kidney disease Mother   . Lung cancer Father   . Stomach cancer Sister   . Colon cancer Neg Hx   . Clotting disorder Other     neice   History  Substance Use Topics  . Smoking status: Former Smoker    Types: Cigarettes  . Smokeless tobacco: Never Used  . Alcohol Use: No   OB History   Grav Para Term  Preterm Abortions TAB SAB Ect Mult Living                 Review of Systems  Constitutional: Negative for fever and chills.  Respiratory: Negative for shortness of breath.   Cardiovascular: Negative for chest pain.  Gastrointestinal: Negative for nausea and vomiting.  Musculoskeletal: Positive for myalgias and back pain.  Skin: Negative.   Neurological: Negative for weakness and numbness.    Allergies  Flexeril; Lipitor; and Metaxalone  Home Medications   Current Outpatient Rx  Name  Route  Sig  Dispense  Refill  . dicyclomine (BENTYL) 20 MG tablet   Oral   Take 1 tablet (20 mg total) by mouth 2 (two) times daily.   60 tablet   0   . ibuprofen (ADVIL,MOTRIN) 600 MG tablet   Oral   Take 1 tablet (600 mg total) by mouth every 6 (six) hours as needed for pain.   60 tablet   0   . lisinopril-hydrochlorothiazide (ZESTORETIC) 20-12.5 MG per tablet   Oral   Take 2 tablets by mouth daily.   180 tablet   3   . metFORMIN (GLUCOPHAGE-XR) 500 MG 24 hr tablet   Oral   Take 1,000 mg by mouth 2 (two) times daily.         Marland Kitchen  pravastatin (PRAVACHOL) 80 MG tablet   Oral   Take 1 tablet (80 mg total) by mouth daily.   90 tablet   3   . traMADol (ULTRAM) 50 MG tablet   Oral   Take 1 tablet (50 mg total) by mouth every 6 (six) hours as needed for pain.   30 tablet   0    Triage Vitals: BP 166/90  Pulse 65  Temp(Src) 97.8 F (36.6 C) (Oral)  Resp 16  Ht 5\' 3"  (1.6 m)  Wt 230 lb (104.327 kg)  BMI 40.75 kg/m2  SpO2 99%  Physical Exam  Constitutional: She is oriented to person, place, and time. She appears well-developed and well-nourished. No distress.  HENT:  Head: Normocephalic and atraumatic.  Right Ear: External ear normal.  Left Ear: External ear normal.  Nose: Nose normal.  Mouth/Throat: Oropharynx is clear and moist.  Eyes: Conjunctivae and EOM are normal. Pupils are equal, round, and reactive to light.  Neck: Neck supple.  Cardiovascular: Normal rate,  regular rhythm, normal heart sounds and intact distal pulses.   Pulmonary/Chest: Effort normal and breath sounds normal.  Abdominal: Soft. There is no tenderness.  Musculoskeletal:       Cervical back: Normal.       Thoracic back: Normal.       Lumbar back: She exhibits tenderness and spasm. She exhibits normal range of motion, no bony tenderness, no swelling, no edema and no deformity.       Back:  Neurological: She is alert and oriented to person, place, and time. She has normal strength. No cranial nerve deficit or sensory deficit. Gait normal. GCS eye subscore is 4. GCS verbal subscore is 5. GCS motor subscore is 6.  No pronator drift. Bilateral heel-knee-shin intact.  Skin: Skin is warm and dry. She is not diaphoretic.    ED Course  Procedures  DIAGNOSTIC STUDIES: Oxygen Saturation is 99% on RA, normal by my interpretation.    COORDINATION OF CARE: 11:51 AM-Discussed treatment plan which includes muscle relaxer. F/U with PCP. Pt agreed to plan.   Labs Review Labs Reviewed - No data to display Imaging Review No results found.  MDM   1. Back pain   2. Muscle spasm of back    Afebrile, NAD, non-toxic appearing, AAOx4. Patient with back pain.  No neurological deficits and normal neuro exam.  Patient can walk but states is painful.  No loss of bowel or bladder control.  No concern for cauda equina.  No fever, night sweats, weight loss, h/o cancer, IVDU.  RICE protocol and pain medicine indicated and discussed with patient. Return precautions discussed. Patient agreeable to plan.     I personally performed the services described in this documentation, which was scribed in my presence. The recorded information has been reviewed and is accurate.     Jeannetta Ellis, PA-C 09/05/13 1543

## 2013-09-05 NOTE — ED Notes (Signed)
Pt c/o lower back pain for months, has not seen a doctor for the pain, but the pain is getting worse. Took tramadol with no relief today. States normally hot shower will also help but today it provided no relief. ambulatory

## 2013-09-07 NOTE — ED Provider Notes (Signed)
Medical screening examination/treatment/procedure(s) were performed by non-physician practitioner and as supervising physician I was immediately available for consultation/collaboration.   Lance Huaracha W Suann Klier, MD 09/07/13 2252 

## 2013-09-09 ENCOUNTER — Telehealth: Payer: Self-pay | Admitting: Internal Medicine

## 2013-09-09 NOTE — Telephone Encounter (Signed)
Patient is calling for SBCE results. She also states she went to ER over the weekend and she was told she had a "cyst on kidney" on xray done by Dr. Juanda Chance. She wants to know if she has a cyst on her kidney. Please, advise.

## 2013-09-09 NOTE — Telephone Encounter (Signed)
Dr Gwenlyn Saran, I have been evaluating Meredith [name] for episodic abd.pain, Her CT scans suggested Crohn's disease but the small bowl capsule endoscopy is normal. 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 Before we do more imaging, could we try to take her off Lisinopril and substitute something else at your discretion. If it does not prevent the attacks, I will order small bowl follow through to look for the "circumferential thickening of the ileum" seen on CT scans. Thanks!  [name] [facility]

## 2013-09-09 NOTE — Telephone Encounter (Signed)
I have left 2 messages for the pt on her mobile and home phone with results of SBCE- normal exam, no evidence of Crohn's disease. Renal cysts are small and stable on CT scans from 2011,05/2012/and12/2013. I need to talke to her or see her in the office because the Lisinopril may be causing her abdominal pain. I will send a note to her PCP

## 2013-09-10 NOTE — Telephone Encounter (Signed)
Thank you very much for your call about Ms. Scheidt. Reviewing her problem list and medications, it seems reasonable to hold the ACEI. Her PCP is Dr. Gildardo Cranker who is also part of the [facility]idency [facility]. I will forward this to him as well. Thank you again,  Marena Chancy, PGY-3 Family Medicine Resident

## 2013-09-10 NOTE — Telephone Encounter (Signed)
Spoke with patient and she has scheduled OV with Dr. Juanda Chance.

## 2013-09-28 ENCOUNTER — Telehealth: Payer: Self-pay | Admitting: Family Medicine

## 2013-09-28 MED ORDER — HYDROCHLOROTHIAZIDE 12.5 MG PO TABS: 12.5000 mg | ORAL_TABLET | Freq: Every day | ORAL | Status: DC

## 2013-09-28 NOTE — Telephone Encounter (Signed)
Received refill request from [facility] for tramadol. Called patient and informed her that she needs an appointment with her PCP, Dr. Paulina Fusi, before anymore tramadol refills can be written for. Also, Dr. 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 Patient has continued to take lisinopril. Instructed her to stop hctz/lisinopril and I will send rx for HCTZ 12.5mg  only. Instructed her that it is very important that she follow up with PCP in 1-2 weeks to see how her BP is doing off of lisinopril.  Patient expressed understanding and agreed with plan.   Meredith [name], PGY-3 Family Medicine Resident

## 2013-09-29 ENCOUNTER — Other Ambulatory Visit: Payer: Self-pay | Admitting: Family Medicine

## 2013-09-29 MED ORDER — PRAVASTATIN SODIUM 80 MG PO TABS: 80.0000 mg | ORAL_TABLET | Freq: Every day | ORAL | Status: DC

## 2013-10-21 ENCOUNTER — Ambulatory Visit (INDEPENDENT_AMBULATORY_CARE_PROVIDER_SITE_OTHER): Payer: BC Managed Care – PPO | Admitting: Family Medicine

## 2013-10-21 ENCOUNTER — Ambulatory Visit: Payer: BC Managed Care – PPO | Admitting: Family Medicine

## 2013-10-21 ENCOUNTER — Encounter: Payer: Self-pay | Admitting: Family Medicine

## 2013-10-21 VITALS — BP 175/95 | HR 74 | Temp 98.9°F | Ht 63.0 in | Wt 243.0 lb

## 2013-10-21 DIAGNOSIS — E119 Type 2 diabetes mellitus without complications: Secondary | ICD-10-CM

## 2013-10-21 DIAGNOSIS — I10 Essential (primary) hypertension: Secondary | ICD-10-CM

## 2013-10-21 DIAGNOSIS — M1990 Unspecified osteoarthritis, unspecified site: Secondary | ICD-10-CM

## 2013-10-21 MED ORDER — TRAMADOL HCL 50 MG PO TABS: 50.0000 mg | ORAL_TABLET | Freq: Four times a day (QID) | ORAL | Status: DC | PRN

## 2013-10-21 MED ORDER — IBUPROFEN 600 MG PO TABS: 600.0000 mg | ORAL_TABLET | Freq: Four times a day (QID) | ORAL | Status: DC | PRN

## 2013-10-21 MED ORDER — HYDROCHLOROTHIAZIDE 25 MG PO TABS: 25.0000 mg | ORAL_TABLET | Freq: Every day | ORAL | Status: DC

## 2013-10-21 NOTE — Assessment & Plan Note (Signed)
Uncontrolled.   Increasing HCTZ to 25 mg daily.

## 2013-10-21 NOTE — Assessment & Plan Note (Signed)
Refilled Ibuprofen and Tramadol. Advised healthy diet and exercise as her obesity plays a large role in her osteoarthritis and pain.

## 2013-10-21 NOTE — Patient Instructions (Signed)
It was nice to see you today.  Your diabetes is well controlled.  I have increased your HCTZ to 25 mg daily.  Continue the Ibuprofen and Tramadol for arthritis and pain.  Continue to try and exercise and lose weight.  Follow up with Dr. Paulina Fusi as indicated.

## 2013-10-21 NOTE — Progress Notes (Signed)
Subjective:     Patient ID: Meredith [name], female   DOB: 08-17-1951, 62 y.o.   MRN: [id number]  HPI   1) Diabetes mellitus, type 2 Disease Monitoring  Blood Sugar Ranges: 90's - 140's  Polyuria: yes   Visual problems: no; followed by Optho  Medication Compliance: no  Medication Side Effects  Hypoglycemia: no   2) HTN Disease Monitoring: Home BP Monitoring - No Chest pain- No    Dyspnea- No Medications:HCTZ 12.5 mg  Compliance-  Yes. Lightheadedness-  No. Edema- No.   3) Knee pain - Patient endorses knee pain bilaterally.  - Worsened over the last year. - Tramadol and Ibuprofen help the pain.  - Patient has significant tri-compartment osteoarthritis on xray. - No reported redness.  Intermittent swelling of knees.  Review of Systems Per HPI    Objective:   Physical Exam Filed Vitals:   10/21/13 1422  BP: 175/95  Pulse: 74  Temp: 98.9 F (37.2 C)  Exam: General: well appearing female in NAD. Cardiovascular: RRR. No murmurs, rubs, or gallops. Respiratory: CTAB. No rales, rhonchi, or wheeze. MSK: Knees - no joint tenderness.  Some crepitus appreciated with extension.  No effusion noted.     Assessment:      See Problem List     Plan:

## 2013-10-21 NOTE — Assessment & Plan Note (Addendum)
Well controlled.  Will continue metformin 1000 mg BID.

## 2013-10-22 ENCOUNTER — Encounter: Payer: Self-pay | Admitting: Internal Medicine

## 2013-10-22 ENCOUNTER — Ambulatory Visit (INDEPENDENT_AMBULATORY_CARE_PROVIDER_SITE_OTHER): Payer: BC Managed Care – PPO | Admitting: Internal Medicine

## 2013-10-22 VITALS — BP 176/106 | HR 72 | Ht 63.0 in | Wt 247.0 lb

## 2013-10-22 DIAGNOSIS — T788XXS Other adverse effects, not elsewhere classified, sequela: Secondary | ICD-10-CM

## 2013-10-22 DIAGNOSIS — T783XXS Angioneurotic edema, sequela: Secondary | ICD-10-CM

## 2013-10-22 DIAGNOSIS — R933 Abnormal findings on diagnostic imaging of other parts of digestive tract: Secondary | ICD-10-CM

## 2013-10-22 DIAGNOSIS — T7589XS Other specified effects of external causes, sequela: Secondary | ICD-10-CM

## 2013-10-22 NOTE — Patient Instructions (Signed)
CC: Dr Paulina Fusi

## 2013-10-22 NOTE — Progress Notes (Signed)
Meredith [name] 10/21/1951 MRN [id number]        History of Present Illness:  This is a 62 year old [name] who comes for followup acute abd. Pain inin August 2014 and which was evaluated in the emergency room and showed a circumferential wall thickening of distal ileum similar to the one in November 2011 and also in June 2013. Inflammatory bowl disease was suspected.  Colonoscopy in 2011 and as well as upper endoscopies were negative. She has a history of iron deficiency anemia. Her small bowel capsule endoscopy in August 2014 had two-hour transit time and no mucosal  abnormality in the small bowel including distal ileum. At the time of these episodes patient was taking lisinopril and the etiology of the abdominal pain has been attributed to of angioneurotic edema ;since discontinuation of the lisinopril patient has had no episodes of abdominal pain.   Past Medical History  Diagnosis Date  . Diabetes mellitus   . Hypertension   . Hyperlipidemia   . Osteoarthritis   . ANEMIA, IRON DEFICIENCY, UNSPEC. 02/05/2007    Qualifier: Diagnosis of  By: Para March MD, Cheree Ditto    . Ventral hernia   . Enteritis   . DJD (degenerative joint disease) of lumbar spine   . Diverticulosis 05/17/12  . Renal cyst, left 05/17/12  . Gastric ulcer 04/2000  . Colon polyps    Past Surgical History  Procedure Laterality Date  . Tubal ligation    . Uterine fibroid surgery    . Operative hysteroscopy    . Abdominal hysterectomy      reports that she quit smoking about 34 years ago. Her smoking use included Cigarettes. She smoked 0.00 packs per day. She has never used smokeless tobacco. She reports that she does not drink alcohol or use illicit drugs. family history includes Clotting disorder in her other; Diabetes in her mother; Kidney disease in her mother; Lung cancer in her father; Stomach cancer in her sister. There is no history of Colon cancer. Allergies  Allergen Reactions  . Flexeril  [Cyclobenzaprine Hcl]     unknown  . Lipitor [Atorvastatin Calcium]     Myalgias  . Metaxalone         Review of Systems: negative for abdominal pain diarrhea rectal bleeding   The remainder of the 10 point ROS is negative except as outlined in H&P   Physical Exam: General appearance  Well developed, in no distress.   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 We will observe for further recurrence of abdominal pain. She is up-to-date on her colonoscopy. We will see her when necessary   10/22/2013 [name]

## 2013-10-25 ENCOUNTER — Telehealth: Payer: Self-pay

## 2013-10-25 NOTE — Telephone Encounter (Signed)
Patient having difficulty sleeping. Would like to know what she can taking OTC to help her sleep.

## 2013-10-26 NOTE — Telephone Encounter (Signed)
Spoke with patient and informed her of below 

## 2013-10-26 NOTE — Telephone Encounter (Signed)
Benadryl 1-2 pills qhs PRN is fine.  Thanks, Twana First. Paulina Fusi, DO of [facility] 10/26/2013, 8:48 AM

## 2013-11-10 ENCOUNTER — Other Ambulatory Visit: Payer: Self-pay | Admitting: Family Medicine

## 2013-11-10 MED ORDER — METFORMIN HCL ER 500 MG PO TB24: 1000.0000 mg | ORAL_TABLET | Freq: Two times a day (BID) | ORAL | Status: DC

## 2013-11-11 ENCOUNTER — Ambulatory Visit (INDEPENDENT_AMBULATORY_CARE_PROVIDER_SITE_OTHER): Payer: BC Managed Care – PPO | Admitting: Family Medicine

## 2013-11-11 VITALS — BP 166/89 | HR 88 | Temp 97.8°F | Ht 63.0 in | Wt 237.0 lb

## 2013-11-11 DIAGNOSIS — F339 Major depressive disorder, recurrent, unspecified: Secondary | ICD-10-CM

## 2013-11-11 MED ORDER — METFORMIN HCL ER 500 MG PO TB24: 1000.0000 mg | ORAL_TABLET | Freq: Two times a day (BID) | ORAL | Status: DC

## 2013-11-11 MED ORDER — AMLODIPINE BESYLATE 5 MG PO TABS: 5.0000 mg | ORAL_TABLET | Freq: Every day | ORAL | Status: DC

## 2013-11-11 MED ORDER — ESCITALOPRAM OXALATE 10 MG PO TABS: 10.0000 mg | ORAL_TABLET | Freq: Every day | ORAL | Status: DC

## 2013-11-11 NOTE — Assessment & Plan Note (Signed)
PHQ-9 - 10.  Patient also has an element of anxiety as well. Patient given Dr. Carola Rhine information. Will start on Lexapro 10 and re-evaluate in ~ 4 weeks.  A total of 30 minutes were spent face-to-face with the patient during this encounter and over half of that time was spent on counseling and coordination of care.

## 2013-11-11 NOTE — Patient Instructions (Signed)
It was nice to see you today.  I have called in a medication for your blood pressure and for your mood.  Please take them as prescribed.  Follow up in 4-6 weeks to see how your doing.  Happy holidays.

## 2013-11-11 NOTE — Progress Notes (Signed)
Subjective:     Patient ID: Meredith [name], female   DOB: Nov 14, 1951, 62 y.o.   MRN: [id number]  HPI 62 year old female presents for follow up regarding HTN.  1) HTN Disease Monitoring: Home BP Monitoring:  Yes Chest pain- No    Dyspnea- Occasional  Medications: Compliance- Yes. Lightheadedness- No  Edema- No  2) Depression Report of symptoms (SIGECAPS): Patient reports insomnia. No loss of interest. No feelings of guilt.  Normal energy level. No reported difficulties with concentration.  Some difficulty with appetite.  No SI.  Patient reports that she has been "crying a lot" for the past 2 months.  She finds herself down quite a bit.  She states that   Duration of symptoms: 2 months.  Impact on function: No reported impact on work or home life.  Psychiatric History (include age of onset of first mood disturbance):  - Diagnoses: Prior history of Depression  - Treatment (include hospitalizations, pharmacotherapy and outpatient therapy): None  Family history of psychiatric issues: No reported family history  Current and history of substance use: No alcohol use.  No illicit drug use.    PHQ-9: 10 MDQ (if indicated):  4/13; No to Question # 2; Minor Problem to Question # 3. GAD-7: 17  Review of Systems Per HPI    Objective:   Physical Exam Filed Vitals:   11/11/13 0923  BP: 166/89  Pulse: 88  Temp:    Exam: General: well appearing female; tearful with history. Cardiovascular: RRR. No murmurs, rubs, or gallops. Respiratory: CTAB. No rales, rhonchi, or wheeze. Abdomen: soft, nontender, nondistended. Neuro: No focal deficits. Psych: flat affect.  Tearful with history/physical exam.  Normal thought process and speech.     Assessment/Plan:    See Problem List

## 2013-11-17 ENCOUNTER — Other Ambulatory Visit: Payer: Self-pay | Admitting: Family Medicine

## 2013-11-22 ENCOUNTER — Telehealth: Payer: Self-pay | Admitting: *Deleted

## 2013-11-22 NOTE — Telephone Encounter (Addendum)
Patient calling requesting a blood sugar meter with strips/lancets, states she tests her sugars three times a day. Sates she has no preference on the brand.  Completed.  Thanks, Twana First. Paulina Fusi, DO of [facility] 11/24/2013, 3:36 PM

## 2013-11-23 ENCOUNTER — Other Ambulatory Visit: Payer: Self-pay | Admitting: Family Medicine

## 2013-11-24 MED ORDER — CVS BLOOD GLUCOSE METER W/DEVICE KIT: PACK | Status: DC

## 2013-11-24 MED ORDER — CVS LANCETS THIN MISC: Status: DC

## 2013-11-24 MED ORDER — GLUCOSE BLOOD VI STRP: ORAL_STRIP | Status: DC

## 2014-01-05 ENCOUNTER — Other Ambulatory Visit: Payer: Self-pay | Admitting: Family Medicine

## 2014-01-07 ENCOUNTER — Other Ambulatory Visit: Payer: Self-pay | Admitting: Family Medicine

## 2014-01-07 MED ORDER — TRAMADOL HCL 50 MG PO TABS: 50.0000 mg | ORAL_TABLET | Freq: Four times a day (QID) | ORAL | Status: DC | PRN

## 2014-02-15 ENCOUNTER — Other Ambulatory Visit: Payer: Self-pay | Admitting: Family Medicine

## 2014-03-04 ENCOUNTER — Telehealth: Payer: Self-pay | Admitting: Family Medicine

## 2014-03-04 ENCOUNTER — Encounter: Payer: Self-pay | Admitting: Family Medicine

## 2014-03-04 ENCOUNTER — Ambulatory Visit (INDEPENDENT_AMBULATORY_CARE_PROVIDER_SITE_OTHER): Payer: BC Managed Care – PPO | Admitting: Family Medicine

## 2014-03-04 VITALS — BP 142/90 | HR 97 | Ht 63.0 in | Wt 245.0 lb

## 2014-03-04 DIAGNOSIS — E119 Type 2 diabetes mellitus without complications: Secondary | ICD-10-CM

## 2014-03-04 DIAGNOSIS — I10 Essential (primary) hypertension: Secondary | ICD-10-CM

## 2014-03-04 DIAGNOSIS — M1990 Unspecified osteoarthritis, unspecified site: Secondary | ICD-10-CM

## 2014-03-04 DIAGNOSIS — Z0000 Encounter for general adult medical examination without abnormal findings: Secondary | ICD-10-CM

## 2014-03-04 LAB — POCT GLYCOSYLATED HEMOGLOBIN (HGB A1C): HEMOGLOBIN A1C: 6.9

## 2014-03-04 MED ORDER — MELOXICAM 15 MG PO TABS: 15.0000 mg | ORAL_TABLET | Freq: Every day | ORAL | Status: DC

## 2014-03-04 MED ORDER — IBUPROFEN 600 MG PO TABS: 600.0000 mg | ORAL_TABLET | Freq: Three times a day (TID) | ORAL | Status: DC | PRN

## 2014-03-04 MED ORDER — ZOSTER VACCINE LIVE 19400 UNT/0.65ML ~~LOC~~ SOLR: 0.6500 mL | Freq: Once | SUBCUTANEOUS | Status: DC

## 2014-03-04 NOTE — Patient Instructions (Signed)
Ms Tanya Nonesickard, it was nice seeing you today.  Please continue with the tramadol as needed and start taking the mobic as needed.  Please stop taking the ibuprofen.  We will see you back in three months.  Thanks, Dr. Paulina FusiHess

## 2014-03-04 NOTE — Assessment & Plan Note (Signed)
Discussed possible injections into her knees, ambivalent about this today.  Will think about, in meantime switch to mobic and stop taking ibuprofen.  F/U in three months or sooner.

## 2014-03-04 NOTE — Assessment & Plan Note (Signed)
Well controlled.  Will continue metformin 1000 mg BID. F/U in three months   

## 2014-03-04 NOTE — Assessment & Plan Note (Signed)
Rx given for Zostavax today, pt ambivalent about getting this done as well as the pneumovax

## 2014-03-04 NOTE — Telephone Encounter (Signed)
I called back in her Ibuprofen.  Please let pt know.   Thanks, Twana FirstBryan R. Paulina FusiHess, DO [facility] 03/04/2014, 11:24 AM

## 2014-03-04 NOTE — Assessment & Plan Note (Signed)
Pt BP well controlled, last BMP done on 7/23 showing stable creatinine of 0.66.  Pt states she is compliant with her medication.  Is allergic (angioedema to lisinopril), HCTZ and norvasc well control.  BMET in three months, consider.

## 2014-03-04 NOTE — Progress Notes (Signed)
Meredith [name] is a 63 y.o. who presents today for generalized physical.  She has no current complaints and has significant PMHx of HTN, HLD, DM II controlled, OA, chronic gastric dyspepsia.    HTN - Well controlled, compliant with medications, no edema, cough, lightheaded, palpitations.   HLD - Compliant with pravachol, no myalgias   DM II - controlled, compliant with Metformin.  No N/V, hypoglycemic Sx.    Past Medical History  Diagnosis Date  . Diabetes mellitus   . Hypertension   . Hyperlipidemia   . Osteoarthritis   . ANEMIA, IRON DEFICIENCY, UNSPEC. 02/05/2007    Qualifier: Diagnosis of  By: Damita Dunnings MD, Phillip Heal    . Ventral hernia   . Enteritis   . DJD (degenerative joint disease) of lumbar spine   . Diverticulosis 05/17/12  . Renal cyst, left 05/17/12  . Gastric ulcer 04/2000  . Colon polyps     History   Social History  . Marital Status: Divorced    Spouse Name: N/A    Number of Children: 51  . Years of Education: N/A   Occupational History  . [employer]    Social History Main Topics  . Smoking status: Former Smoker    Types: Cigarettes    Quit date: 12/09/1978  . Smokeless tobacco: Never Used  . Alcohol Use: No  . Drug Use: No  . Sexual Activity: Not Currently   Other Topics Concern  . Not on file   Social History Narrative  . No narrative on file    Family History  Problem Relation Age of Onset  . Diabetes Mother   . Kidney disease Mother   . Lung cancer Father   . Stomach cancer Sister   . Colon cancer Neg Hx   . Clotting disorder Other     neice    Current Outpatient Prescriptions on File Prior to Visit  Medication Sig Dispense Refill  . amLODipine (NORVASC) 5 MG tablet Take 1 tablet (5 mg total) by mouth daily.  90 tablet  1  . Blood Glucose Monitoring Suppl ([facility] BLOOD GLUCOSE METER) W/DEVICE KIT As Directed  1 kit  0  . [facility] LANCETS THIN MISC As Directed  200 each  11  . dicyclomine (BENTYL) 20 MG tablet Take 1 tablet (20 mg total) by mouth 2 (two)  times daily.  60 tablet  0  . escitalopram (LEXAPRO) 10 MG tablet TAKE 1 TABLET EVERY DAY  30 tablet  0  . glucose blood ([facility] BLOOD GLUCOSE TEST STRIPS) test strip Use as instructed  100 each  12  . hydrochlorothiazide (HYDRODIURIL) 25 MG tablet TAKE 1 TABLET (25 MG TOTAL) BY MOUTH DAILY.  30 tablet  3  . ibuprofen (ADVIL,MOTRIN) 600 MG tablet Take 1 tablet (600 mg total) by mouth every 6 (six) hours as needed.  60 tablet  0  . metFORMIN (GLUCOPHAGE-XR) 500 MG 24 hr tablet Take 2 tablets (1,000 mg total) by mouth 2 (two) times daily.  180 tablet  3  . pravastatin (PRAVACHOL) 80 MG tablet Take 1 tablet (80 mg total) by mouth daily.  90 tablet  3  . PRESCRIPTION MEDICATION Apply 1 application topically 2 (two) times daily as needed (for knee pain. topical cream for pain).      . traMADol (ULTRAM) 50 MG tablet Take 1 tablet (50 mg total) by mouth every 6 (six) hours as needed.  90 tablet  1   No current facility-administered medications on file prior to visit.  Physical Exam Filed Vitals:   03/04/14 0852  BP: 142/90  Pulse: 97   Gen: NAD, Well nourished, Well developed HEENT: PERLA, EOMI, [place]/AT Neck: no JVD Cardio: RRR, No murmurs/gallops/rubs Lungs: CTA, no wheezes, rhonchi, crackles Abd: NABS, soft nontender nondistended MSK: ROM normal  Neuro: CN 2-12 intact, MS 5/5 B/L UE and LE, +2 patellar and achilles relfex b/l  Psych: AAO x 3     Chemistry      Component Value Date/Time   NA 142 06/30/2013 0140   K 3.3* 06/30/2013 0140   CL 103 06/30/2013 0140   CO2 29 06/30/2013 0140   BUN 18 06/30/2013 0140   CREATININE 0.66 06/30/2013 0140   CREATININE 0.66 12/25/2012 0929      Component Value Date/Time   CALCIUM 9.7 06/30/2013 0140   ALKPHOS 46 06/30/2013 0140   AST 11 06/30/2013 0140   ALT 8 06/30/2013 0140   BILITOT 0.4 06/30/2013 0140      Lab Results  Component Value Date   HGBA1C 6.9 03/04/2014

## 2014-03-04 NOTE — Telephone Encounter (Signed)
Pt called and said that the medication she was given today she can not take for her knees. She would like something else called in. jw

## 2014-03-07 NOTE — Telephone Encounter (Signed)
Unable to reach patient tried several times no voice mail set up.Harace Mccluney, Virgel BouquetGiovanna S

## 2014-03-10 NOTE — Telephone Encounter (Signed)
Pt is confused about her medication for her knees. Said dr Gayla Dossjoyner told her not to take ibuprofen but then prescribed it.  Also cant take meloxicam because of blood pressure Please advise

## 2014-04-01 NOTE — Telephone Encounter (Signed)
Pt called and would like the status update on her medication. She has been waiting and needs some medication now. jw

## 2014-04-01 NOTE — Telephone Encounter (Signed)
Patient is still having her knee pain which is increasing right now is 10/10. Medication for her joint pain, she states that she was told not to take the Ibuprofen at her visit. Is there anything else that patient can be prescribed. [facility] [address]. If patient does not answer phone message can be left.

## 2014-04-01 NOTE — Telephone Encounter (Signed)
Please let her know that she can take tylenol 650 mg q 6-8 hours as needed on top of the mobic.  If she feels the mobic is not doing much for her, she can stop taking this and start taking the ibuprofen (600 mg) every 8 hours again.  As well, please reiterate to her that a corticosteroid injection would be of benefit for her.  Thanks, Twana FirstBryan R. Paulina FusiHess, DO [facility] 04/01/2014, 11:48 AM

## 2014-04-07 ENCOUNTER — Encounter: Payer: Self-pay | Admitting: Family Medicine

## 2014-04-07 ENCOUNTER — Encounter: Payer: Self-pay | Admitting: *Deleted

## 2014-04-07 ENCOUNTER — Ambulatory Visit (INDEPENDENT_AMBULATORY_CARE_PROVIDER_SITE_OTHER): Payer: BC Managed Care – PPO | Admitting: Family Medicine

## 2014-04-07 ENCOUNTER — Ambulatory Visit
Admission: RE | Admit: 2014-04-07 | Discharge: 2014-04-07 | Disposition: A | Discharge status: Home / Self Care | Payer: BC Managed Care – PPO | Source: Ambulatory Visit | Attending: Family Medicine | Admitting: Family Medicine

## 2014-04-07 VITALS — BP 161/91 | HR 88 | Temp 98.1°F | Ht 63.0 in | Wt 250.0 lb

## 2014-04-07 DIAGNOSIS — I10 Essential (primary) hypertension: Secondary | ICD-10-CM

## 2014-04-07 DIAGNOSIS — M1990 Unspecified osteoarthritis, unspecified site: Secondary | ICD-10-CM

## 2014-04-07 DIAGNOSIS — E119 Type 2 diabetes mellitus without complications: Secondary | ICD-10-CM

## 2014-04-07 MED ORDER — DICLOFENAC SODIUM 1 % TD GEL: 2.0000 g | Freq: Three times a day (TID) | TRANSDERMAL | Status: DC

## 2014-04-07 NOTE — Progress Notes (Signed)
Meredith [name] is a 63 y.o. who presents today for generalized physical.  She has no current complaints and has significant PMHx of HTN, HLD, DM II controlled, OA, chronic gastric dyspepsia.    HTN - Slightly elevated today, pt states to pain. Compliant with medications, no edema, cough, lightheaded, palpitations.   HLD - Compliant with pravachol, no myalgias   DM II - controlled, compliant with Metformin.  No N/V, hypoglycemic Sx.    OA B/L Knees - Pt with hx of medial compartment DJD, last x-rays in 2011.  Trying tramadol PRN, ibuprofen for her pain with minimal relief.  Does not want injection at this time.   Past Medical History  Diagnosis Date  . Diabetes mellitus   . Hypertension   . Hyperlipidemia   . Osteoarthritis   . ANEMIA, IRON DEFICIENCY, UNSPEC. 02/05/2007    Qualifier: Diagnosis of  By: Duncan MD, Graham    . Ventral hernia   . Enteritis   . DJD (degenerative joint disease) of lumbar spine   . Diverticulosis 05/17/12  . Renal cyst, left 05/17/12  . Gastric ulcer 04/2000  . Colon polyps     History   Social History  . Marital Status: Divorced    Spouse Name: N/A    Number of Children: 4  . Years of Education: N/A   Occupational History  . COOK    Social History Main Topics  . Smoking status: Former Smoker    Types: Cigarettes    Quit date: 12/09/1978  . Smokeless tobacco: Never Used  . Alcohol Use: No  . Drug Use: No  . Sexual Activity: Not Currently   Other Topics Concern  . Not on file   Social History Narrative  . No narrative on file    Family History  Problem Relation Age of Onset  . Diabetes Mother   . Kidney disease Mother   . Lung cancer Father   . Stomach cancer Sister   . Colon cancer Neg Hx   . Clotting disorder Other     neice    Current Outpatient Prescriptions on File Prior to Visit  Medication Sig Dispense Refill  . amLODipine (NORVASC) 5 MG tablet Take 1 tablet (5 mg total) by mouth daily.  90 tablet  1  . Blood Glucose  Monitoring Suppl ([facility] BLOOD GLUCOSE METER) W/DEVICE KIT As Directed  1 kit  0  . [facility] LANCETS THIN MISC As Directed  200 each  11  . dicyclomine (BENTYL) 20 MG tablet Take 1 tablet (20 mg total) by mouth 2 (two) times daily.  60 tablet  0  . escitalopram (LEXAPRO) 10 MG tablet TAKE 1 TABLET EVERY DAY  30 tablet  0  . glucose blood ([facility] BLOOD GLUCOSE TEST STRIPS) test strip Use as instructed  100 each  12  . hydrochlorothiazide (HYDRODIURIL) 25 MG tablet TAKE 1 TABLET (25 MG TOTAL) BY MOUTH DAILY.  30 tablet  3  . metFORMIN (GLUCOPHAGE-XR) 500 MG 24 hr tablet Take 2 tablets (1,000 mg total) by mouth 2 (two) times daily.  180 tablet  3  . pravastatin (PRAVACHOL) 80 MG tablet Take 1 tablet (80 mg total) by mouth daily.  90 tablet  3  . PRESCRIPTION MEDICATION Apply 1 application topically 2 (two) times daily as needed (for knee pain. topical cream for pain).      . traMADol (ULTRAM) 50 MG tablet Take 1 tablet (50 mg total) by mouth every 6 (six) hours as needed.  90   tablet  1  . ibuprofen (ADVIL,MOTRIN) 600 MG tablet Take 1 tablet (600 mg total) by mouth every 8 (eight) hours as needed.  90 tablet  1  . zoster vaccine live, PF, (ZOSTAVAX) 70350 UNT/0.65ML injection Inject 19,400 Units into the skin once.  1 each  0   No current facility-administered medications on file prior to visit.   Physical Exam Filed Vitals:   04/07/14 0923  BP: 161/91  Pulse: 88  Temp: 98.1 F (36.7 C)   Gen: NAD, Well nourished, Well developed HEENT: PERLA, EOMI, [place]/AT Neck: no JVD Cardio: RRR, No murmurs/gallops/rubs Lungs: CTA, no wheezes, rhonchi, crackles Abd: NABS, soft nontender nondistended MSK: no erythema/effusion of knee joint.  TTP medial compartment B/L knee. Ligamentous intact, no meniscal provocative testing.  Neuro: CN 2-12 intact, MS 5/5 B/L UE and LE, +2 patellar and achilles relfex b/l  Psych: AAO x 3     Chemistry      Component Value Date/Time   NA 142 06/30/2013 0140   K 3.3* 06/30/2013  0140   CL 103 06/30/2013 0140   CO2 29 06/30/2013 0140   BUN 18 06/30/2013 0140   CREATININE 0.66 06/30/2013 0140   CREATININE 0.66 12/25/2012 0929      Component Value Date/Time   CALCIUM 9.7 06/30/2013 0140   ALKPHOS 46 06/30/2013 0140   AST 11 06/30/2013 0140   ALT 8 06/30/2013 0140   BILITOT 0.4 06/30/2013 0140      Lab Results  Component Value Date   HGBA1C 6.9 03/04/2014    Knee X-ray 2011 R - Interpreted by myself today, some joint space narrowing medial compartment with some osteosclerosis but no cysts or osteophytes present.

## 2014-04-07 NOTE — Assessment & Plan Note (Signed)
Well controlled.  Will continue metformin 1000 mg BID. F/U in three months

## 2014-04-07 NOTE — Patient Instructions (Signed)
Ms. Meredith [name], it was nice seeing you today.  Please take your medications as follows: 1) Tylenol 650 mg (two tablets of 325 strength) three times per day.  2) Ibuprofen 600 mg three times per day 3) Tramadol as needed 4) Voltaren gel to the knees 2-3 times per day.  We will see you back in one month.  Thanks, Dr. Paulina FusiHess

## 2014-04-07 NOTE — Assessment & Plan Note (Addendum)
Pt BP slightly elevated today, last BMP done on 7/23 showing stable creatinine of 0.66.  Pt states she is compliant with her medication.  Is allergic (angioedema to lisinopril), HCTZ and norvasc well control.  BMET consideration at next visit.

## 2014-04-07 NOTE — Assessment & Plan Note (Signed)
For her OA B/L knees, will get new x-rays as last ones performed in 2011.  As well, will put her on regimen consisting of 1) Tylenol 650 mg TID, 2) Ibuprofen 600 mg TID, 3) Tramadol 50 mg PRN, 4) Voltaren gel 2-3 x per day.  F/U in one month and if no improvement, consider B/L Cortisone injections

## 2014-04-07 NOTE — Progress Notes (Signed)
[facility] Voltaren 1% gel.  PA form placed in provider for review.  Clovis Puamika L Nida Manfredi, RN

## 2014-04-08 NOTE — Progress Notes (Signed)
[facility]  Clovis Puamika L Attikus Bartoszek, RN

## 2014-04-08 NOTE — Progress Notes (Signed)
Completed and placed on desk.  Thanks, Twana FirstBryan R. Paulina FusiHess, DO of [facility] 04/08/2014, 1:35 PM

## 2014-04-12 ENCOUNTER — Telehealth: Payer: Self-pay | Admitting: *Deleted

## 2014-04-12 NOTE — Progress Notes (Signed)
PA for Voltaren 1% gel denied per [employer] of [place].  Medication is not covered when used in conjunction with an oral NSAID/COX-2 inhibitor or when used to treat pain in the hip, spine or shoulder.  Denial form placed in provider box for review.  Clovis Puamika L Martin, RN

## 2014-04-12 NOTE — Telephone Encounter (Signed)
Pt called and stating she felt pain on left side.  Felt like a little pinch.  Pt asked if she hit, fell or turned the wrong way; pt denied.  Pt stated it started yesterday.  Pt denies any change in urination or UTI symptoms.  Pt asked if she took to help with pain relief, she stated not yet.  Pt stated she thinks it is because she took one of her medications with food and not drinking a full glass of water.  Pt advised to schedule appt if symptoms worsen and try taken Ibuprofen to help with pain relief.  Pt stated understanding.  Clovis Puamika L Delia Slatten, RN

## 2014-04-28 ENCOUNTER — Telehealth: Payer: Self-pay | Admitting: *Deleted

## 2014-04-28 ENCOUNTER — Other Ambulatory Visit: Payer: Self-pay

## 2014-04-28 DIAGNOSIS — Z1231 Encounter for screening mammogram for malignant neoplasm of breast: Secondary | ICD-10-CM

## 2014-04-28 NOTE — Telephone Encounter (Signed)
Received fax from [employer] Stating prescription for compouned topical pain cream on 06/17/2013 is no longer covered under pt's insurance.  They made a therapeutic substitution that is covered under pt's insurance which is Velma Patch that consists of: Methyl Salicylate 16%, Lidocaine 4% and Menthol 2%.  No other action needed at this time unless provider has questions.  If questions please call [phone] ext [phone] for Lavonda Jumboracy Kirkpatrick, Pharm.D.  Clovis Puamika L Martin, RN

## 2014-05-03 ENCOUNTER — Other Ambulatory Visit: Payer: Self-pay | Admitting: Family Medicine

## 2014-05-04 ENCOUNTER — Telehealth: Payer: Self-pay | Admitting: Family Medicine

## 2014-05-04 NOTE — Telephone Encounter (Signed)
Would like to know results from xrays. Still in pain. Please let her know what she can take

## 2014-05-04 NOTE — Telephone Encounter (Signed)
Discussed results with pt, she has end stage OA in both knees and would benefit from TKA B/L.  Discussed conservative measures that may or may not benefit her, including hinged unloading brace, cymbalta, injections.  Pt will f/u in a couple weeks.  Twana First Kimetha Trulson, DO of [facility] 05/04/2014, 10:06 AM

## 2014-06-03 ENCOUNTER — Ambulatory Visit
Admission: RE | Admit: 2014-06-03 | Discharge: 2014-06-03 | Disposition: A | Discharge status: Home / Self Care | Payer: BC Managed Care – PPO | Source: Ambulatory Visit

## 2014-06-03 ENCOUNTER — Encounter: Payer: Self-pay | Admitting: Family Medicine

## 2014-06-03 ENCOUNTER — Telehealth: Payer: Self-pay | Admitting: *Deleted

## 2014-06-03 DIAGNOSIS — Z1231 Encounter for screening mammogram for malignant neoplasm of breast: Secondary | ICD-10-CM

## 2014-06-03 NOTE — Telephone Encounter (Signed)
Dr Paulina FusiHess if you could please print RX for tremadol and leave it upfront or I could put rx upront for patient to pick.she's aware of new protocol.states she was mis informed from ladys up front thank you.Proposito, Virgel BouquetGiovanna S

## 2014-06-03 NOTE — Progress Notes (Unsigned)
Patient [facility] a [facility] [address]

## 2014-06-03 NOTE — Progress Notes (Signed)
Needs a hard copy of this medication/needs to pick this up.  Thanks, Twana FirstBryan R. Paulina FusiHess, DO [facility] 06/03/2014, 12:40 PM

## 2014-06-06 ENCOUNTER — Telehealth: Payer: Self-pay | Admitting: *Deleted

## 2014-06-06 MED ORDER — TRAMADOL HCL 50 MG PO TABS: 50.0000 mg | ORAL_TABLET | Freq: Four times a day (QID) | ORAL | Status: DC | PRN

## 2014-06-06 NOTE — Addendum Note (Signed)
Addended by: Birdie SonsSONNENBERG, ERIC G on: 06/06/2014 12:06 PM   Modules accepted: Orders

## 2014-06-06 NOTE — Telephone Encounter (Signed)
Patient informed that RX will be placed upfront late this afternoon.she voiced great appreciation and states she'll pick it up tomorrow morning.Proposito, Virgel BouquetGiovanna S

## 2014-06-06 NOTE — Telephone Encounter (Signed)
Dr Birdie SonsSonnenberg I understand your covering Dr Hess,patient requesting her tremadol,If you could read previous messages,Dr Paulina FusiHess states hard copy needed.please advise.Meredith [name], Meredith BouquetGiovanna [name]

## 2014-06-06 NOTE — Telephone Encounter (Signed)
Attempted to call patient, no answer 

## 2014-06-06 NOTE — Telephone Encounter (Signed)
Will refill and place the hard copy at the front after lunch. Please inform the patient she can pick this up some time this afternoon after 1 pm.

## 2014-06-06 NOTE — Telephone Encounter (Signed)
Dr Birdie SonsSonnenberg gave refill and left printed script at front desk.

## 2014-06-21 ENCOUNTER — Other Ambulatory Visit: Payer: Self-pay | Admitting: Family Medicine

## 2014-06-23 ENCOUNTER — Encounter: Payer: Self-pay | Admitting: Family Medicine

## 2014-06-23 ENCOUNTER — Ambulatory Visit (INDEPENDENT_AMBULATORY_CARE_PROVIDER_SITE_OTHER): Payer: BC Managed Care – PPO | Admitting: Family Medicine

## 2014-06-23 VITALS — BP 163/98 | HR 68 | Temp 97.7°F | Ht 63.0 in | Wt 251.0 lb

## 2014-06-23 DIAGNOSIS — M1990 Unspecified osteoarthritis, unspecified site: Secondary | ICD-10-CM

## 2014-06-23 DIAGNOSIS — E119 Type 2 diabetes mellitus without complications: Secondary | ICD-10-CM

## 2014-06-23 DIAGNOSIS — I10 Essential (primary) hypertension: Secondary | ICD-10-CM

## 2014-06-23 DIAGNOSIS — E7800 Pure hypercholesterolemia, unspecified: Secondary | ICD-10-CM

## 2014-06-23 LAB — POCT GLYCOSYLATED HEMOGLOBIN (HGB A1C): Hemoglobin A1C: 7.3

## 2014-06-23 NOTE — Progress Notes (Signed)
Meredith [name] is a 63 y.o. who presents today for generalized physical.  She has no current complaints and has significant PMHx of HTN, HLD, DM II controlled, OA.   HTN - Slightly elevated today, pt states to pain. Compliant with medications, no edema, cough, lightheaded, palpitations.  Did run out of medications two days ago but otherwise scompliant.   HLD - Compliant with pravachol, no myalgias   DM II - controlled, compliant with Metformin.  No N/V, hypoglycemic Sx.    OA B/L Knees - Pt with hx of medial compartment DJD, last x-rays in April 2015 show end stage medial compartment DJD.   Trying tramadol PRN, ibuprofen for her pain with minimal relief.  Does not want injection at this time and does not want to go to ortho for TKA discussion.   Past Medical History  Diagnosis Date  . Diabetes mellitus   . Hypertension   . Hyperlipidemia   . Osteoarthritis   . ANEMIA, IRON DEFICIENCY, UNSPEC. 02/05/2007    Qualifier: Diagnosis of  By: Damita Dunnings MD, Phillip Heal    . Ventral hernia   . Enteritis   . DJD (degenerative joint disease) of lumbar spine   . Diverticulosis 05/17/12  . Renal cyst, left 05/17/12  . Gastric ulcer 04/2000  . Colon polyps     History   Social History  . Marital Status: Divorced    Spouse Name: N/A    Number of Children: 34  . Years of Education: N/A   Occupational History  . C[employer]    Social History Main Topics  . Smoking status: Former Smoker    Types: Cigarettes    Quit date: 12/09/1978  . Smokeless tobacco: Never Used  . Alcohol Use: No  . Drug Use: No  . Sexual Activity: Not Currently   Other Topics Concern  . Not on file   Social History Narrative  . No narrative on file    Family History  Problem Relation Age of Onset  . Diabetes Mother   . Kidney disease Mother   . Lung cancer Father   . Stomach cancer Sister   . Colon cancer Neg Hx   . Clotting disorder Other     neice    Current Outpatient Prescriptions on File Prior to Visit  Medication  Sig Dispense Refill  . amLODipine (NORVASC) 5 MG tablet TAKE 1 TABLET EVERY DAY  90 tablet  3  . Blood Glucose Monitoring Suppl ([facility] BLOOD GLUCOSE METER) W/DEVICE KIT As Directed  1 kit  0  . [facility] LANCETS THIN MISC As Directed  200 each  11  . diclofenac sodium (VOLTAREN) 1 % GEL Apply 2 g topically 3 (three) times daily.  100 g  5  . dicyclomine (BENTYL) 20 MG tablet Take 1 tablet (20 mg total) by mouth 2 (two) times daily.  60 tablet  0  . escitalopram (LEXAPRO) 10 MG tablet TAKE 1 TABLET EVERY DAY  30 tablet  0  . glucose blood ([facility] BLOOD GLUCOSE TEST STRIPS) test strip Use as instructed  100 each  12  . hydrochlorothiazide (HYDRODIURIL) 25 MG tablet TAKE 1 TABLET (25 MG TOTAL) BY MOUTH DAILY.  30 tablet  3  . ibuprofen (ADVIL,MOTRIN) 600 MG tablet Take 1 tablet (600 mg total) by mouth every 8 (eight) hours as needed.  90 tablet  1  . metFORMIN (GLUCOPHAGE-XR) 500 MG 24 hr tablet Take 2 tablets (1,000 mg total) by mouth 2 (two) times daily.  180 tablet  3  . pravastatin (PRAVACHOL) 80 MG tablet Take 1 tablet (80 mg total) by mouth daily.  90 tablet  3  . PRESCRIPTION MEDICATION Apply 1 application topically 2 (two) times daily as needed (for knee pain. topical cream for pain).      . traMADol (ULTRAM) 50 MG tablet Take 1 tablet (50 mg total) by mouth every 6 (six) hours as needed.  90 tablet  0  . zoster vaccine live, PF, (ZOSTAVAX) 40814 UNT/0.65ML injection Inject 19,400 Units into the skin once.  1 each  0   No current facility-administered medications on file prior to visit.   Physical Exam Filed Vitals:   06/23/14 0935  BP: 163/98  Pulse: 68  Temp: 97.7 F (36.5 C)   Gen: NAD, Well nourished, Well developed HEENT: PERLA, EOMI, [place]/AT Neck: no JVD Cardio: RRR, No murmurs/gallops/rubs Lungs: CTA, no wheezes, rhonchi, crackles Abd: NABS, soft nontender nondistended MSK: no erythema/effusion of knee joint.  TTP medial compartment B/L knee. Ligamentous intact, no meniscal provocative  testing.  Neuro: CN 2-12 intact, MS 5/5 B/L UE and LE, +2 patellar and achilles relfex b/l  Psych: AAO x 3     Chemistry      Component Value Date/Time   NA 142 06/30/2013 0140   K 3.3* 06/30/2013 0140   CL 103 06/30/2013 0140   CO2 29 06/30/2013 0140   BUN 18 06/30/2013 0140   CREATININE 0.66 06/30/2013 0140   CREATININE 0.66 12/25/2012 0929      Component Value Date/Time   CALCIUM 9.7 06/30/2013 0140   ALKPHOS 46 06/30/2013 0140   AST 11 06/30/2013 0140   ALT 8 06/30/2013 0140   BILITOT 0.4 06/30/2013 0140      Lab Results  Component Value Date   HGBA1C 7.3 06/23/2014

## 2014-06-23 NOTE — Assessment & Plan Note (Signed)
Well controlled, continue metformin 1000 mg BID.  Will check CMET and CBC today and f/u on A1C.  F/U in three months.

## 2014-06-23 NOTE — Assessment & Plan Note (Signed)
Continues on Pravachol 80 mg qd.  Lipid Panel today, does have DM II and risk > 7.5% on ASCVD so needs high dose statin but cannot afford Crestor/intolerant of lipitor

## 2014-06-23 NOTE — Assessment & Plan Note (Signed)
Her Knees show B/L end stage OA medial compartment.  As well, will put her on regimen consisting of 1) Tylenol 650 mg TID, 2) Ibuprofen 600 mg TID, 3) Tramadol 50 mg PRN, 4) Voltaren gel 2-3 x per day.  F/U in three months.  Discussed referral to ortho for discussion for TKA.

## 2014-06-23 NOTE — Assessment & Plan Note (Signed)
Ran out of medications for the past two days.  Refill today, f/u in three months, denies any ADRs

## 2014-06-23 NOTE — Patient Instructions (Signed)
Ms. Meredith [name], it was nice seeing you today.  Please take your medications as follows: 1) Tylenol 650 mg (two tablets of 325 strength) three times per day.  2) Ibuprofen 600 mg three times per day 3) Tramadol as needed 4) Voltaren gel to the knees 2-3 times per day.  We will see you back in three month.  Thanks, Dr. Paulina FusiHess

## 2014-06-28 ENCOUNTER — Other Ambulatory Visit: Payer: Self-pay | Admitting: Family Medicine

## 2014-07-19 ENCOUNTER — Telehealth: Payer: Self-pay | Admitting: Family Medicine

## 2014-07-19 NOTE — Telephone Encounter (Signed)
Pt received letter from pharmacy stating her high blood pressure pills have been recalled. She needs to know what she can take Please advise

## 2014-07-19 NOTE — Telephone Encounter (Signed)
Can we please find out which pills she means or drop the letter off?  Thanks, Twana FirstBryan R. Paulina FusiHess, DO of [facility] 07/19/2014, 4:44 PM

## 2014-07-20 NOTE — Telephone Encounter (Signed)
Spoke with patient and informed her of the conversation that I had with the

## 2014-09-27 ENCOUNTER — Other Ambulatory Visit: Payer: Self-pay | Admitting: Family Medicine

## 2014-10-12 ENCOUNTER — Other Ambulatory Visit: Payer: Self-pay | Admitting: Family Medicine

## 2014-11-21 ENCOUNTER — Other Ambulatory Visit: Payer: Self-pay | Admitting: Family Medicine

## 2014-11-21 NOTE — Telephone Encounter (Signed)
Please let pt know this is ready for pick-up 

## 2014-11-21 NOTE — Telephone Encounter (Signed)
Please let pt know her Tramadol is ready for pick up.  Thanks Tesoro CorporationBryan R. Paulina FusiHess, DO of [facility] 11/21/2014, 1:19 PM

## 2014-11-22 NOTE — Telephone Encounter (Signed)
Left voice message for pt that Rx for Tramadol is ready for pick up.  Clovis PuMartin, Tamika L, RN

## 2014-11-24 ENCOUNTER — Other Ambulatory Visit: Payer: Self-pay | Admitting: Family Medicine

## 2014-11-25 ENCOUNTER — Encounter: Payer: Self-pay | Admitting: Family Medicine

## 2014-11-25 ENCOUNTER — Ambulatory Visit (HOSPITAL_COMMUNITY)
Admission: RE | Admit: 2014-11-25 | Discharge: 2014-11-25 | Disposition: A | Discharge status: Home / Self Care | Payer: Self-pay | Source: Ambulatory Visit | Attending: Family Medicine | Admitting: Family Medicine

## 2014-11-25 ENCOUNTER — Other Ambulatory Visit: Payer: Self-pay | Admitting: Family Medicine

## 2014-11-25 ENCOUNTER — Telehealth: Payer: Self-pay | Admitting: Family Medicine

## 2014-11-25 ENCOUNTER — Ambulatory Visit (INDEPENDENT_AMBULATORY_CARE_PROVIDER_SITE_OTHER): Payer: Self-pay | Admitting: Family Medicine

## 2014-11-25 VITALS — BP 140/80 | HR 79 | Temp 98.4°F | Ht 63.0 in | Wt 247.4 lb

## 2014-11-25 DIAGNOSIS — E669 Obesity, unspecified: Secondary | ICD-10-CM

## 2014-11-25 DIAGNOSIS — E7800 Pure hypercholesterolemia, unspecified: Secondary | ICD-10-CM

## 2014-11-25 DIAGNOSIS — I10 Essential (primary) hypertension: Secondary | ICD-10-CM

## 2014-11-25 DIAGNOSIS — I4891 Unspecified atrial fibrillation: Secondary | ICD-10-CM | POA: Insufficient documentation

## 2014-11-25 DIAGNOSIS — E119 Type 2 diabetes mellitus without complications: Secondary | ICD-10-CM

## 2014-11-25 DIAGNOSIS — I499 Cardiac arrhythmia, unspecified: Secondary | ICD-10-CM

## 2014-11-25 LAB — COMPREHENSIVE METABOLIC PANEL
ALT: 10 U/L (ref 0–35)
AST: 12 U/L (ref 0–37)
Albumin: 4.1 g/dL (ref 3.5–5.2)
Alkaline Phosphatase: 43 U/L (ref 39–117)
BUN: 16 mg/dL (ref 6–23)
CALCIUM: 9.9 mg/dL (ref 8.4–10.5)
CHLORIDE: 102 meq/L (ref 96–112)
CO2: 29 meq/L (ref 19–32)
Creat: 0.81 mg/dL (ref 0.50–1.10)
Glucose, Bld: 103 mg/dL — ABNORMAL HIGH (ref 70–99)
Potassium: 3.9 mEq/L (ref 3.5–5.3)
Sodium: 142 mEq/L (ref 135–145)
TOTAL PROTEIN: 7.2 g/dL (ref 6.0–8.3)
Total Bilirubin: 0.3 mg/dL (ref 0.2–1.2)

## 2014-11-25 LAB — LIPID PANEL
CHOLESTEROL: 173 mg/dL (ref 0–200)
HDL: 53 mg/dL (ref >39)
LDL CALC: 93 mg/dL (ref 0–99)
TRIGLYCERIDES: 135 mg/dL (ref <150)
Total CHOL/HDL Ratio: 3.3 Ratio
VLDL: 27 mg/dL (ref 0–40)

## 2014-11-25 LAB — CBC WITH DIFFERENTIAL/PLATELET
BASOS ABS: 0 10*3/uL (ref 0.0–0.1)
Basophils Relative: 0 % (ref 0–1)
EOS ABS: 0.2 10*3/uL (ref 0.0–0.7)
Eosinophils Relative: 2 % (ref 0–5)
HCT: 38.1 % (ref 36.0–46.0)
Hemoglobin: 13 g/dL (ref 12.0–15.0)
Lymphocytes Relative: 36 % (ref 12–46)
Lymphs Abs: 2.9 10*3/uL (ref 0.7–4.0)
MCH: 26.1 pg (ref 26.0–34.0)
MCHC: 34.1 g/dL (ref 30.0–36.0)
MCV: 76.4 fL — ABNORMAL LOW (ref 78.0–100.0)
MONO ABS: 0.4 10*3/uL (ref 0.1–1.0)
MONOS PCT: 5 % (ref 3–12)
MPV: 10.6 fL (ref 9.4–12.4)
NEUTROS ABS: 4.6 10*3/uL (ref 1.7–7.7)
NEUTROS PCT: 57 % (ref 43–77)
PLATELETS: 227 10*3/uL (ref 150–400)
RBC: 4.99 MIL/uL (ref 3.87–5.11)
RDW: 14.8 % (ref 11.5–15.5)
WBC: 8 10*3/uL (ref 4.0–10.5)

## 2014-11-25 LAB — POCT GLYCOSYLATED HEMOGLOBIN (HGB A1C): HEMOGLOBIN A1C: 6.8

## 2014-11-25 MED ORDER — WARFARIN SODIUM 5 MG PO TABS: 5.0000 mg | ORAL_TABLET | Freq: Every day | ORAL | Status: DC

## 2014-11-25 MED ORDER — METFORMIN HCL ER 500 MG PO TB24: ORAL_TABLET | ORAL | Status: DC

## 2014-11-25 NOTE — Telephone Encounter (Signed)
Pt calls to after hours line this evening to report her coumadin was not at the pharmacy when she went to pick it up. Patient was seen by Dr. Paulina FusiHess today, and his note does reflect he wanted the patient started on coumadin for new onset A.Fib on EKG today at [facility]. He does not mention what dpse he wanted top start her on, and there is no prescription under medications reflecting he sent it in.  - [facility] PO [facility]. Patient educated on no NSAIDS use while using coumadin. Patient has an appointment for Monday for followup.   Felix PaciniKuneff, Meloney Feld DO PGY3 [facility]

## 2014-11-25 NOTE — Progress Notes (Signed)
Meredith [name] is a 63 y.o. who presents today for generalized physical.  She has no current complaints and has significant PMHx of HTN, HLD, DM II controlled, OA.   HTN - Compliant with medications, no edema, cough, lightheaded, palpitations.    HLD - Compliant with pravachol, no myalgias   DM II - controlled, compliant with Metformin.  No N/V, hypoglycemic Sx.    OA B/L Knees - Pt with hx of medial compartment DJD, last x-rays in April 2015 show end stage medial compartment DJD.   Trying tramadol PRN, ibuprofen for her pain with minimal relief.  Does not want injection at this time and does not want to go to ortho for TKA discussion.   Past Medical History  Diagnosis Date  . Diabetes mellitus   . Hypertension   . Hyperlipidemia   . Osteoarthritis   . ANEMIA, IRON DEFICIENCY, UNSPEC. 02/05/2007    Qualifier: Diagnosis of  By: Damita Dunnings MD, Phillip Heal    . Ventral hernia   . Enteritis   . DJD (degenerative joint disease) of lumbar spine   . Diverticulosis 05/17/12  . Renal cyst, left 05/17/12  . Gastric ulcer 04/2000  . Colon polyps     History   Social History  . Marital Status: Divorced    Spouse Name: N/A    Number of Children: 75  . Years of Education: N/A   Occupational History  . [employer]    Social History Main Topics  . Smoking status: Former Smoker    Types: Cigarettes    Quit date: 12/09/1978  . Smokeless tobacco: Never Used  . Alcohol Use: No  . Drug Use: No  . Sexual Activity: Not Currently   Other Topics Concern  . Not on file   Social History Narrative  . No narrative on file    Family History  Problem Relation Age of Onset  . Diabetes Mother   . Kidney disease Mother   . Lung cancer Father   . Stomach cancer Sister   . Colon cancer Neg Hx   . Clotting disorder Other     neice    Current Outpatient Prescriptions on File Prior to Visit  Medication Sig Dispense Refill  . amLODipine (NORVASC) 5 MG tablet TAKE 1 TABLET EVERY DAY 90 tablet 3  . Blood Glucose  Monitoring Suppl ([facility] BLOOD GLUCOSE METER) W/DEVICE KIT As Directed 1 kit 0  . [facility] LANCETS THIN MISC As Directed 200 each 11  . diclofenac sodium (VOLTAREN) 1 % GEL Apply 2 g topically 3 (three) times daily. 100 g 5  . dicyclomine (BENTYL) 20 MG tablet Take 1 tablet (20 mg total) by mouth 2 (two) times daily. 60 tablet 0  . escitalopram (LEXAPRO) 10 MG tablet TAKE 1 TABLET EVERY DAY 30 tablet 0  . glucose blood ([facility] BLOOD GLUCOSE TEST STRIPS) test strip Use as instructed 100 each 12  . hydrochlorothiazide (HYDRODIURIL) 25 MG tablet TAKE 1 TABLET EVERY DAY 30 tablet 3  . ibuprofen (ADVIL,MOTRIN) 600 MG tablet Take 1 tablet (600 mg total) by mouth every 8 (eight) hours as needed. 90 tablet 1  . metFORMIN (GLUCOPHAGE-XR) 500 MG 24 hr tablet TAKE 2 TABLETS BY MOUTH 2 TIMES A DAY 180 tablet 3  . pravastatin (PRAVACHOL) 80 MG tablet TAKE 1 TABLET EVERY DAY 90 tablet 3  . PRESCRIPTION MEDICATION Apply 1 application topically 2 (two) times daily as needed (for knee pain. topical cream for pain).    . traMADol (ULTRAM) 50 MG  tablet AKE 1 TABLET BY MOUTH EVERY 6 HOURS AS NEEDED 90 tablet 3  . zoster vaccine live, PF, (ZOSTAVAX) 07225 UNT/0.65ML injection Inject 19,400 Units into the skin once. 1 each 0   No current facility-administered medications on file prior to visit.   Physical Exam Filed Vitals:   11/25/14 1506  BP: 140/80  Pulse:   Temp:    Gen: NAD, Well nourished, Well developed HEENT: PERLA, EOMI, [place]/AT Neck: no JVD Cardio: Irregular irregular, 2/6 diastolic murmur RUSB Lungs: CTA, no wheezes, rhonchi, crackles Abd: NABS, soft nontender nondistended MSK: no erythema/effusion of knee joint.  TTP medial compartment B/L knee. Ligamentous intact, no meniscal provocative testing.  Neuro: CN 2-12 intact, MS 5/5 B/L UE and LE, +2 patellar and achilles relfex b/l  Psych: AAO x 3     Chemistry      Component Value Date/Time   NA 142 06/30/2013 0140   K 3.3* 06/30/2013 0140   CL 103  06/30/2013 0140   CO2 29 06/30/2013 0140   BUN 18 06/30/2013 0140   CREATININE 0.66 06/30/2013 0140   CREATININE 0.66 12/25/2012 0929      Component Value Date/Time   CALCIUM 9.7 06/30/2013 0140   ALKPHOS 46 06/30/2013 0140   AST 11 06/30/2013 0140   ALT 8 06/30/2013 0140   BILITOT 0.4 06/30/2013 0140      Lab Results  Component Value Date   HGBA1C 7.3 06/23/2014    >50% of the 40 minutes of the visit was spent face to face in counseling and discussion.

## 2014-11-25 NOTE — Assessment & Plan Note (Signed)
Continue on Pravachol 80 mg qd Lipid Panel today  Cannot afford Crestor and unable to tolerate Lipitor

## 2014-11-25 NOTE — Assessment & Plan Note (Signed)
-   Well controlled, continue metformin 1000 mg BID.   - Will check CMET and CBC today and f/u on A1C.   - F/U in 3-6 months

## 2014-11-25 NOTE — Assessment & Plan Note (Addendum)
Repeat well controlled, continue current regimen.  Consider addition of ARB due to underlying DM II and HLD since pt cannot tolerate lisinopril - CMET today - F/U in 3-6 months for repeat

## 2014-11-25 NOTE — Patient Instructions (Addendum)
Ms. Tanya Nonesickard, it was nice seeing you today.  Please start taking the coumadin as prescribed.  As well we will get an US of your heart.  Please see the cardiologist when they call.  We will see you back in 4 weeks.    Thanks, Dr. Paulina FusiHess

## 2014-11-25 NOTE — Assessment & Plan Note (Addendum)
Pt does not appear to be in NSR this PM. - Obtain EKG which show Atrial Fibrillation w/ LAD, rate controlled - Pt is ASx at this time, pressure stable  - Add on TSH, BNP, Trop, and Echo and refer to cardiology  - CHADS VASC of 3, will start on coumadin since no insurance.  Does not need bridge as low risk of TIA/CVA at his time - Will not start on BB today as rate controlled, ASx.  However, definitely room to add this on if appropriate.

## 2014-11-26 LAB — TSH: TSH: 3.429 u[IU]/mL (ref 0.350–4.500)

## 2014-11-26 LAB — PRO B NATRIURETIC PEPTIDE: PRO B NATRI PEPTIDE: 670 pg/mL — AB (ref <126)

## 2014-11-28 ENCOUNTER — Ambulatory Visit (INDEPENDENT_AMBULATORY_CARE_PROVIDER_SITE_OTHER): Payer: PRIVATE HEALTH INSURANCE | Admitting: *Deleted

## 2014-11-28 DIAGNOSIS — Z7901 Long term (current) use of anticoagulants: Secondary | ICD-10-CM

## 2014-11-28 DIAGNOSIS — I499 Cardiac arrhythmia, unspecified: Secondary | ICD-10-CM

## 2014-11-28 DIAGNOSIS — I498 Other specified cardiac arrhythmias: Secondary | ICD-10-CM

## 2014-11-28 LAB — POCT INR: INR: 1.5

## 2014-11-29 ENCOUNTER — Ambulatory Visit (HOSPITAL_COMMUNITY)
Admission: RE | Admit: 2014-11-29 | Discharge: 2014-11-29 | Disposition: A | Discharge status: Home / Self Care | Payer: Self-pay | Source: Ambulatory Visit | Attending: Family Medicine | Admitting: Family Medicine

## 2014-11-29 DIAGNOSIS — I4891 Unspecified atrial fibrillation: Secondary | ICD-10-CM | POA: Insufficient documentation

## 2014-11-29 DIAGNOSIS — I499 Cardiac arrhythmia, unspecified: Secondary | ICD-10-CM

## 2014-11-29 DIAGNOSIS — E785 Hyperlipidemia, unspecified: Secondary | ICD-10-CM | POA: Insufficient documentation

## 2014-11-29 DIAGNOSIS — E119 Type 2 diabetes mellitus without complications: Secondary | ICD-10-CM | POA: Insufficient documentation

## 2014-11-29 DIAGNOSIS — I10 Essential (primary) hypertension: Secondary | ICD-10-CM | POA: Insufficient documentation

## 2014-11-29 DIAGNOSIS — I517 Cardiomegaly: Secondary | ICD-10-CM

## 2014-11-29 NOTE — Progress Notes (Signed)
  Echocardiogram 2D Echocardiogram has been performed.  Meredith [name], Meredith [name] 11/29/2014, 2:05 PM

## 2014-11-30 ENCOUNTER — Ambulatory Visit (INDEPENDENT_AMBULATORY_CARE_PROVIDER_SITE_OTHER): Payer: PRIVATE HEALTH INSURANCE | Admitting: *Deleted

## 2014-11-30 DIAGNOSIS — I499 Cardiac arrhythmia, unspecified: Secondary | ICD-10-CM

## 2014-11-30 DIAGNOSIS — I498 Other specified cardiac arrhythmias: Secondary | ICD-10-CM

## 2014-11-30 DIAGNOSIS — Z7901 Long term (current) use of anticoagulants: Secondary | ICD-10-CM

## 2014-11-30 LAB — POCT INR: INR: 2

## 2014-12-05 ENCOUNTER — Ambulatory Visit (INDEPENDENT_AMBULATORY_CARE_PROVIDER_SITE_OTHER): Payer: PRIVATE HEALTH INSURANCE | Admitting: *Deleted

## 2014-12-05 DIAGNOSIS — Z7901 Long term (current) use of anticoagulants: Secondary | ICD-10-CM

## 2014-12-05 DIAGNOSIS — I498 Other specified cardiac arrhythmias: Secondary | ICD-10-CM

## 2014-12-05 DIAGNOSIS — I499 Cardiac arrhythmia, unspecified: Secondary | ICD-10-CM

## 2014-12-05 LAB — POCT INR: INR: 2.6

## 2014-12-12 ENCOUNTER — Ambulatory Visit (INDEPENDENT_AMBULATORY_CARE_PROVIDER_SITE_OTHER): Payer: PRIVATE HEALTH INSURANCE | Admitting: *Deleted

## 2014-12-12 DIAGNOSIS — I498 Other specified cardiac arrhythmias: Secondary | ICD-10-CM

## 2014-12-12 DIAGNOSIS — I499 Cardiac arrhythmia, unspecified: Secondary | ICD-10-CM

## 2014-12-12 DIAGNOSIS — Z7901 Long term (current) use of anticoagulants: Secondary | ICD-10-CM

## 2014-12-12 LAB — POCT INR: INR: 2.8

## 2014-12-19 ENCOUNTER — Other Ambulatory Visit: Payer: Self-pay | Admitting: Family Medicine

## 2014-12-22 ENCOUNTER — Ambulatory Visit (INDEPENDENT_AMBULATORY_CARE_PROVIDER_SITE_OTHER): Payer: Self-pay | Admitting: *Deleted

## 2014-12-22 DIAGNOSIS — I499 Cardiac arrhythmia, unspecified: Secondary | ICD-10-CM

## 2014-12-22 DIAGNOSIS — I498 Other specified cardiac arrhythmias: Secondary | ICD-10-CM

## 2014-12-22 DIAGNOSIS — Z7901 Long term (current) use of anticoagulants: Secondary | ICD-10-CM

## 2014-12-22 LAB — POCT INR: INR: 2.5

## 2014-12-27 ENCOUNTER — Telehealth: Payer: Self-pay | Admitting: *Deleted

## 2014-12-27 NOTE — Telephone Encounter (Signed)
Spoke with patient and informed her of [facility]11 at 11:30am patient agreed to this appointment and was given phone number if needing to cancel or reschedule

## 2015-01-05 ENCOUNTER — Encounter (HOSPITAL_COMMUNITY): Payer: Self-pay

## 2015-01-05 ENCOUNTER — Emergency Department (HOSPITAL_COMMUNITY): Payer: PRIVATE HEALTH INSURANCE

## 2015-01-05 ENCOUNTER — Other Ambulatory Visit: Payer: Self-pay

## 2015-01-05 ENCOUNTER — Ambulatory Visit (INDEPENDENT_AMBULATORY_CARE_PROVIDER_SITE_OTHER): Payer: PRIVATE HEALTH INSURANCE | Admitting: *Deleted

## 2015-01-05 ENCOUNTER — Emergency Department (HOSPITAL_COMMUNITY)
Admission: EM | Admit: 2015-01-05 | Discharge: 2015-01-06 | Disposition: A | Discharge status: Home / Self Care | Payer: PRIVATE HEALTH INSURANCE | Attending: Emergency Medicine | Admitting: Emergency Medicine

## 2015-01-05 DIAGNOSIS — R630 Anorexia: Secondary | ICD-10-CM | POA: Insufficient documentation

## 2015-01-05 DIAGNOSIS — Z87891 Personal history of nicotine dependence: Secondary | ICD-10-CM | POA: Insufficient documentation

## 2015-01-05 DIAGNOSIS — Z862 Personal history of diseases of the blood and blood-forming organs and certain disorders involving the immune mechanism: Secondary | ICD-10-CM | POA: Insufficient documentation

## 2015-01-05 DIAGNOSIS — E119 Type 2 diabetes mellitus without complications: Secondary | ICD-10-CM | POA: Insufficient documentation

## 2015-01-05 DIAGNOSIS — N2889 Other specified disorders of kidney and ureter: Secondary | ICD-10-CM

## 2015-01-05 DIAGNOSIS — N342 Other urethritis: Secondary | ICD-10-CM | POA: Insufficient documentation

## 2015-01-05 DIAGNOSIS — Z7901 Long term (current) use of anticoagulants: Secondary | ICD-10-CM | POA: Insufficient documentation

## 2015-01-05 DIAGNOSIS — Z9851 Tubal ligation status: Secondary | ICD-10-CM | POA: Insufficient documentation

## 2015-01-05 DIAGNOSIS — Z86010 Personal history of colonic polyps: Secondary | ICD-10-CM | POA: Insufficient documentation

## 2015-01-05 DIAGNOSIS — Z8719 Personal history of other diseases of the digestive system: Secondary | ICD-10-CM | POA: Insufficient documentation

## 2015-01-05 DIAGNOSIS — Z87448 Personal history of other diseases of urinary system: Secondary | ICD-10-CM | POA: Insufficient documentation

## 2015-01-05 DIAGNOSIS — I498 Other specified cardiac arrhythmias: Secondary | ICD-10-CM

## 2015-01-05 DIAGNOSIS — Z90710 Acquired absence of both cervix and uterus: Secondary | ICD-10-CM | POA: Insufficient documentation

## 2015-01-05 DIAGNOSIS — Z791 Long term (current) use of non-steroidal anti-inflammatories (NSAID): Secondary | ICD-10-CM | POA: Insufficient documentation

## 2015-01-05 DIAGNOSIS — I499 Cardiac arrhythmia, unspecified: Secondary | ICD-10-CM

## 2015-01-05 DIAGNOSIS — E785 Hyperlipidemia, unspecified: Secondary | ICD-10-CM | POA: Insufficient documentation

## 2015-01-05 DIAGNOSIS — I10 Essential (primary) hypertension: Secondary | ICD-10-CM | POA: Insufficient documentation

## 2015-01-05 DIAGNOSIS — Z79899 Other long term (current) drug therapy: Secondary | ICD-10-CM | POA: Insufficient documentation

## 2015-01-05 DIAGNOSIS — M1990 Unspecified osteoarthritis, unspecified site: Secondary | ICD-10-CM | POA: Insufficient documentation

## 2015-01-05 DIAGNOSIS — R109 Unspecified abdominal pain: Secondary | ICD-10-CM

## 2015-01-05 LAB — CBC WITH DIFFERENTIAL/PLATELET
Basophils Absolute: 0 10*3/uL (ref 0.0–0.1)
Basophils Relative: 0 % (ref 0–1)
EOS ABS: 0.1 10*3/uL (ref 0.0–0.7)
Eosinophils Relative: 2 % (ref 0–5)
HCT: 36.2 % (ref 36.0–46.0)
HEMOGLOBIN: 12.8 g/dL (ref 12.0–15.0)
LYMPHS ABS: 2.3 10*3/uL (ref 0.7–4.0)
LYMPHS PCT: 32 % (ref 12–46)
MCH: 26.8 pg (ref 26.0–34.0)
MCHC: 35.4 g/dL (ref 30.0–36.0)
MCV: 75.9 fL — AB (ref 78.0–100.0)
Monocytes Absolute: 0.4 10*3/uL (ref 0.1–1.0)
Monocytes Relative: 5 % (ref 3–12)
Neutro Abs: 4.4 10*3/uL (ref 1.7–7.7)
Neutrophils Relative %: 61 % (ref 43–77)
PLATELETS: 233 10*3/uL (ref 150–400)
RBC: 4.77 MIL/uL (ref 3.87–5.11)
RDW: 13.7 % (ref 11.5–15.5)
WBC: 7.2 10*3/uL (ref 4.0–10.5)

## 2015-01-05 LAB — COMPREHENSIVE METABOLIC PANEL
ALBUMIN: 3.8 g/dL (ref 3.5–5.2)
ALK PHOS: 45 U/L (ref 39–117)
ALT: 12 U/L (ref 0–35)
AST: 16 U/L (ref 0–37)
Anion gap: 10 (ref 5–15)
BILIRUBIN TOTAL: 0.5 mg/dL (ref 0.3–1.2)
BUN: 18 mg/dL (ref 6–23)
CHLORIDE: 103 mmol/L (ref 96–112)
CO2: 29 mmol/L (ref 19–32)
Calcium: 9.7 mg/dL (ref 8.4–10.5)
Creatinine, Ser: 0.72 mg/dL (ref 0.50–1.10)
GFR calc non Af Amer: 89 mL/min — ABNORMAL LOW (ref >90)
GLUCOSE: 141 mg/dL — AB (ref 70–99)
Potassium: 3 mmol/L — ABNORMAL LOW (ref 3.5–5.1)
Sodium: 142 mmol/L (ref 135–145)
TOTAL PROTEIN: 7.5 g/dL (ref 6.0–8.3)

## 2015-01-05 LAB — URINALYSIS, ROUTINE W REFLEX MICROSCOPIC
BILIRUBIN URINE: NEGATIVE
GLUCOSE, UA: NEGATIVE mg/dL
Ketones, ur: NEGATIVE mg/dL
Nitrite: NEGATIVE
Protein, ur: 100 mg/dL — AB
Specific Gravity, Urine: 1.018 (ref 1.005–1.030)
UROBILINOGEN UA: 1 mg/dL (ref 0.0–1.0)
pH: 6 (ref 5.0–8.0)

## 2015-01-05 LAB — URINE MICROSCOPIC-ADD ON

## 2015-01-05 LAB — PROTIME-INR
INR: 2.05 — AB (ref 0.00–1.49)
PROTHROMBIN TIME: 23.3 s — AB (ref 11.6–15.2)

## 2015-01-05 LAB — LIPASE, BLOOD: Lipase: 40 U/L (ref 11–59)

## 2015-01-05 LAB — POCT INR: INR: 2.7

## 2015-01-05 LAB — I-STAT CG4 LACTIC ACID, ED: Lactic Acid, Venous: 1.09 mmol/L (ref 0.5–2.0)

## 2015-01-05 LAB — TROPONIN I: Troponin I: <0.03 ng/mL (ref <0.031)

## 2015-01-05 MED ORDER — IOHEXOL 300 MG/ML  SOLN
100.0000 mL | Freq: Once | INTRAMUSCULAR | Status: AC | PRN
  Administered 2015-01-05: 100 mL via INTRAVENOUS

## 2015-01-05 MED ORDER — MORPHINE SULFATE 4 MG/ML IJ SOLN
4.0000 mg | Freq: Once | INTRAMUSCULAR | Status: DC
  Filled 2015-01-05: qty 1

## 2015-01-05 MED ORDER — SODIUM CHLORIDE 0.9 % IV BOLUS (SEPSIS)
1000.0000 mL | Freq: Once | INTRAVENOUS | Status: AC
  Administered 2015-01-05: 1000 mL via INTRAVENOUS

## 2015-01-05 MED ORDER — MORPHINE SULFATE 2 MG/ML IJ SOLN
2.0000 mg | Freq: Once | INTRAMUSCULAR | Status: AC
  Administered 2015-01-05: 2 mg via INTRAVENOUS
  Filled 2015-01-05: qty 1

## 2015-01-05 NOTE — ED Provider Notes (Signed)
CSN: [id number]     Arrival date & time 01/05/15  6286 History   First MD Initiated Contact with Patient 01/05/15 2006     Chief Complaint  Patient presents with  . Abdominal Pain  . Hematuria     (Consider location/radiation/quality/duration/timing/severity/associated sxs/prior Treatment) HPI  64 year old female presents with abdominal pain intermittently for the last 1 week. Noticed blood in her urine prior to arrival, this scared her and so she came in to the ER. She is currently on Coumadin and has been for quite some time for atrial fibrillation. The patient's pain is mostly upper, near her umbilicus. No nausea, vomiting, or diarrhea. Pain comes and goes without any regularity. Is not worse with food. She's not been eating as much recently because she is scared she'll eat the wrong food in throat for INR. However she states is deathly not causing pain to eat. Patient denies a shortness of breath. No dysuria, just the one episode of hematuria. Never had pain like this before. No back pain. Patient had a severe pain 2 days ago that has been progressively improving. Pain is mild-moderate, rates as a 7/10. Feels bloated.  Past Medical History  Diagnosis Date  . Diabetes mellitus   . Hypertension   . Hyperlipidemia   . Osteoarthritis   . ANEMIA, IRON DEFICIENCY, UNSPEC. 02/05/2007    Qualifier: Diagnosis of  By: Damita Dunnings MD, Phillip Heal    . Ventral hernia   . Enteritis   . DJD (degenerative joint disease) of lumbar spine   . Diverticulosis 05/17/12  . Renal cyst, left 05/17/12  . Gastric ulcer 04/2000  . Colon polyps    Past Surgical History  Procedure Laterality Date  . Tubal ligation    . Uterine fibroid surgery    . Operative hysteroscopy    . Abdominal hysterectomy     Family History  Problem Relation Age of Onset  . Diabetes Mother   . Kidney disease Mother   . Lung cancer Father   . Stomach cancer Sister   . Colon cancer Neg Hx   . Clotting disorder Other     neice   History   Substance Use Topics  . Smoking status: Former Smoker    Types: Cigarettes    Quit date: 12/09/1978  . Smokeless tobacco: Never Used  . Alcohol Use: No   OB History    No data available     Review of Systems  Constitutional: Negative for fever.  Respiratory: Negative for shortness of breath.   Cardiovascular: Negative for chest pain.  Gastrointestinal: Positive for abdominal pain. Negative for nausea, vomiting, diarrhea, constipation and blood in stool.  Genitourinary: Positive for hematuria. Negative for dysuria.  All other systems reviewed and are negative.     Allergies  Flexeril; Lipitor; Lisinopril; and Metaxalone  Home Medications   Prior to Admission medications   Medication Sig Start Date End Date Taking? Authorizing Provider  amLODipine (NORVASC) 5 MG tablet TAKE 1 TABLET EVERY DAY 05/03/14   Nolon Rod, DO  Blood Glucose Monitoring Suppl ([facility] BLOOD GLUCOSE METER) W/DEVICE KIT As Directed 11/24/13   Nolon Rod, DO  [facility] LANCETS THIN MISC As Directed 11/24/13   Nolon Rod, DO  diclofenac sodium (VOLTAREN) 1 % GEL Apply 2 g topically 3 (three) times daily. 04/07/14   Tamela Oddi Hess, DO  dicyclomine (BENTYL) 20 MG tablet Take 1 tablet (20 mg total) by mouth 2 (two) times daily. 07/26/13   Nolon Rod, DO  escitalopram (LEXAPRO) 10 MG tablet TAKE 1 TABLET EVERY DAY 01/05/14   Nolon Rod, DO  glucose blood (CVS BLOOD GLUCOSE TEST STRIPS) test strip Use as instructed 11/24/13   Tamela Oddi Hess, DO  hydrochlorothiazide (HYDRODIURIL) 25 MG tablet TAKE 1 TABLET EVERY DAY 10/12/14   Nolon Rod, DO  metFORMIN (GLUCOPHAGE-XR) 500 MG 24 hr tablet TAKE 2 TABLETS BY MOUTH 2 TIMES A DAY 11/25/14   [name] R Hess, DO  pravastatin (PRAVACHOL) 80 MG tablet TAKE 1 TABLET EVERY DAY 09/27/14   Nolon Rod, DO  PRESCRIPTION MEDICATION Apply 1 application topically 2 (two) times daily as needed (for knee pain. topical cream for pain).    Historical Provider, MD  traMADol (ULTRAM) 50 MG  tablet AKE 1 TABLET BY MOUTH EVERY 6 HOURS AS NEEDED 11/25/14   Nolon Rod, DO  warfarin (COUMADIN) 5 MG tablet TAKE 1 TABLET BY MOUTH DAILY. 12/19/14   Nolon Rod, DO  zoster vaccine live, PF, (ZOSTAVAX) 62863 UNT/0.65ML injection Inject 19,400 Units into the skin once. 03/04/14   [name] R Hess, DO   BP 182/87 mmHg  Pulse 74  Temp(Src) 97.9 F (36.6 C) (Oral)  Resp 18  SpO2 99% Physical Exam  Constitutional: She is oriented to person, place, and time. She appears well-developed and well-nourished. No distress.  HENT:  Head: Normocephalic and atraumatic.  Right Ear: External ear normal.  Left Ear: External ear normal.  Nose: Nose normal.  Eyes: Right eye exhibits no discharge. Left eye exhibits no discharge.  Cardiovascular: Normal rate, regular rhythm and normal heart sounds.   Pulmonary/Chest: Effort normal and breath sounds normal.  Abdominal: Soft. There is tenderness in the epigastric area, periumbilical area and left upper quadrant.  Neurological: She is alert and oriented to person, place, and time.  Skin: Skin is warm and dry. She is not diaphoretic.  Nursing note and vitals reviewed.   ED Course  Procedures (including critical care time) Labs Review Labs Reviewed  CBC WITH DIFFERENTIAL/PLATELET - Abnormal; Notable for the following:    MCV 75.9 (*)    All other components within normal limits  COMPREHENSIVE METABOLIC PANEL - Abnormal; Notable for the following:    Potassium 3.0 (*)    Glucose, Bld 141 (*)    GFR calc non Af Amer 89 (*)    All other components within normal limits  PROTIME-INR - Abnormal; Notable for the following:    Prothrombin Time 23.3 (*)    INR 2.05 (*)    All other components within normal limits  URINALYSIS, ROUTINE W REFLEX MICROSCOPIC - Abnormal; Notable for the following:    Color, Urine RED (*)    APPearance TURBID (*)    Hgb urine dipstick LARGE (*)    Protein, ur 100 (*)    Leukocytes, UA TRACE (*)    All other components within  normal limits  URINE CULTURE  LIPASE, BLOOD  TROPONIN I  URINE MICROSCOPIC-ADD ON  I-STAT CG4 LACTIC ACID, ED    Imaging Review Ct Abdomen Pelvis W Contrast  01/05/2015   CLINICAL DATA:  Acute onset of generalized abdominal pain. Initial encounter.  EXAM: CT ABDOMEN AND PELVIS WITH CONTRAST  TECHNIQUE: Multidetector CT imaging of the abdomen and pelvis was performed using the standard protocol following bolus administration of intravenous contrast.  CONTRAST:  131m OMNIPAQUE IOHEXOL 300 MG/ML  SOLN  COMPARISON:  CT of the abdomen and pelvis performed 11/28/2012  FINDINGS: Minimal right basilar atelectasis is noted.  The liver and spleen are unremarkable in appearance. The gallbladder is within normal limits. The pancreas and adrenal glands are unremarkable.  Mild soft tissue inflammation is noted about the left renal pelvis and proximal left ureter, raising concern for mild left-sided pyelonephritis and ureteritis. No obstructing stone is seen; there is no evidence of hydronephrosis. A likely small parenchymal calcification is noted within the right kidney. Small bilateral renal cysts are seen. The kidneys are otherwise unremarkable.  A few mildly prominent adjacent periaortic nodes measure up to 8 mm in short axis; these are stable from 2013 and likely reflect the patient's baseline.  No free fluid is identified. The small bowel is unremarkable in appearance. The stomach is within normal limits. No acute vascular abnormalities are seen. Mild scattered vascular calcifications are seen.  Several small anterior abdominal wall hernias are noted superior to the umbilicus, containing only fat.  The appendix is normal in caliber and contains trace contrast, without evidence for appendicitis. Scattered diverticulosis is noted along the ascending and descending colon. Contrast progresses to the level of the descending colon. The colon is otherwise unremarkable in appearance.  The bladder is relatively  decompressed and grossly unremarkable. The patient is status post hysterectomy. No suspicious adnexal masses are seen. No inguinal lymphadenopathy is seen.  No acute osseous abnormalities are identified. There is grade 1 anterolisthesis of L5 on S1, reflecting underlying facet disease. There is chronic osseous fusion at L4-L5.  IMPRESSION: 1. Mild soft tissue inflammation about the left renal pelvis and the proximal left ureter, raising concern for mild left-sided pyelonephritis and ureteritis. No evidence of hydronephrosis; no obstructing stone seen. 2. Small bilateral renal cysts noted. 3. Several small anterior abdominal wall hernias noted superior to the umbilicus, containing only fat. 4. Scattered diverticulosis along the ascending and descending colon. 5. Mild scattered vascular calcifications seen.   Electronically Signed   By: Garald Balding M.D.   On: 01/05/2015 23:50     EKG Interpretation None      MDM   Final diagnoses:  Abdominal pain in female patient  Ureteritis    Patient has mild abdominal tenderness, CT shows no acute obstruction but does show mild inflammation around the left renal pelvis and proximal ureter, possibly pyelonephritis and ureteritis. Has hematuria but no significant pyuria. At this point, given the findings, we'll cover like she has possible Pilo and send urine for culture. She is otherwise well appearing, has no fevers, elevated white blood cell count, or kidney dysfunction. Stable for outpatient treatment and will recommend urology follow-up.    Ephraim Hamburger, MD 01/06/15 [phone]

## 2015-01-05 NOTE — ED Notes (Signed)
Pt reports abdominal pain x 2 weeks. Also reports one episode of hematuria tonight.  Sts blood looked dark in urine.  Pt is on coumadin. Denies nvd.  Thought abdominal pain was gas but has not subsided.

## 2015-01-06 ENCOUNTER — Telehealth: Payer: Self-pay | Admitting: Family Medicine

## 2015-01-06 MED ORDER — CEPHALEXIN 500 MG PO CAPS: 500.0000 mg | ORAL_CAPSULE | Freq: Four times a day (QID) | ORAL | Status: DC

## 2015-01-06 NOTE — Telephone Encounter (Signed)
Left voice message for pt to consult with pharmacist regarding antibiotic interaction with coumadin.  If the pharmacist states she should not take medication, she should contact the physician at the ED to change her Rx.  Will forward to PCP for further advise.  Clovis PuMartin, Tamika L, RN

## 2015-01-06 NOTE — Discharge Instructions (Signed)
Abdominal Pain °Many things can cause abdominal pain. Usually, abdominal pain is not caused by a disease and will improve without treatment. It can often be observed and treated at home. Your health care provider will do a physical exam and possibly order blood tests and X-rays to help determine the seriousness of your pain. However, in many cases, more time must pass before a clear cause of the pain can be found. Before that point, your health care provider may not know if you need more testing or further treatment. °HOME CARE INSTRUCTIONS  °Monitor your abdominal pain for any changes. The following actions may help to alleviate any discomfort you are experiencing: °· Only take over-the-counter or prescription medicines as directed by your health care provider. °· Do not take laxatives unless directed to do so by your health care provider. °· Try a clear liquid diet (broth, tea, or water) as directed by your health care provider. Slowly move to a bland diet as tolerated. °SEEK MEDICAL CARE IF: °· You have unexplained abdominal pain. °· You have abdominal pain associated with nausea or diarrhea. °· You have pain when you urinate or have a bowel movement. °· You experience abdominal pain that wakes you in the night. °· You have abdominal pain that is worsened or improved by eating food. °· You have abdominal pain that is worsened with eating fatty foods. °· You have a fever. °SEEK IMMEDIATE MEDICAL CARE IF:  °· Your pain does not go away within 2 hours. °· You keep throwing up (vomiting). °· Your pain is felt only in portions of the abdomen, such as the right side or the left lower portion of the abdomen. °· You pass bloody or black tarry stools. °MAKE SURE YOU: °· Understand these instructions.   °· Will watch your condition.   °· Will get help right away if you are not doing well or get worse.   °Document Released: 09/04/2005 Document Revised: 11/30/2013 Document Reviewed: 08/04/2013 °ExitCare® Patient Information  ©2015 [employer]. This information is not intended to replace advice given to you by your health care provider. Make sure you discuss any questions you have with your health care provider. ° °Pyelonephritis, Adult °Pyelonephritis is a kidney infection. In general, there are 2 main types of pyelonephritis: °· Infections that come on quickly without any warning (acute pyelonephritis). °· Infections that persist for a long period of time (chronic pyelonephritis). °CAUSES  °Two main causes of pyelonephritis are: °· Bacteria traveling from the bladder to the kidney. This is a problem especially in pregnant women. The urine in the bladder can become filled with bacteria from multiple causes, including: °¨ Inflammation of the prostate gland (prostatitis). °¨ Sexual intercourse in females. °¨ Bladder infection (cystitis). °· Bacteria traveling from the bloodstream to the tissue part of the kidney. °Problems that may increase your risk of getting a kidney infection include: °· Diabetes. °· Kidney stones or bladder stones. °· Cancer. °· Catheters placed in the bladder. °· Other abnormalities of the kidney or ureter. °SYMPTOMS  °· Abdominal pain. °· Pain in the side or flank area. °· Fever. °· Chills. °· Upset stomach. °· Blood in the urine (dark urine). °· Frequent urination. °· Strong or persistent urge to urinate. °· Burning or stinging when urinating. °DIAGNOSIS  °Your caregiver may diagnose your kidney infection based on your symptoms. A urine sample may also be taken. °TREATMENT  °In general, treatment depends on how severe the infection is.  °· If the infection is mild and caught early, your   caregiver may treat you with oral antibiotics and send you home. °· If the infection is more severe, the bacteria may have gotten into the bloodstream. This will require intravenous (IV) antibiotics and a hospital stay. Symptoms may include: °¨ High fever. °¨ Severe flank pain. °¨ Shaking chills. °· Even after a hospital stay,  your caregiver may require you to be on oral antibiotics for a period of time. °· Other treatments may be required depending upon the cause of the infection. °HOME CARE INSTRUCTIONS  °· Take your antibiotics as directed. Finish them even if you start to feel better. °· Make an appointment to have your urine checked to make sure the infection is gone. °· Drink enough fluids to keep your urine clear or pale yellow. °· Take medicines for the bladder if you have urgency and frequency of urination as directed by your caregiver. °SEEK IMMEDIATE MEDICAL CARE IF:  °· You have a fever or persistent symptoms for more than 2-3 days. °· You have a fever and your symptoms suddenly get worse. °· You are unable to take your antibiotics or fluids. °· You develop shaking chills. °· You experience extreme weakness or fainting. °· There is no improvement after 2 days of treatment. °MAKE SURE YOU: °· Understand these instructions. °· Will watch your condition. °· Will get help right away if you are not doing well or get worse. °Document Released: 11/25/2005 Document Revised: 05/26/2012 Document Reviewed: 05/01/2011 °ExitCare® Patient Information ©2015 [employer]. This information is not intended to replace advice given to you by your health care provider. Make sure you discuss any questions you have with your health care provider. ° °

## 2015-01-06 NOTE — Telephone Encounter (Signed)
Patient need advice about taking her antibiotic that she received from the ED last night because she had blood in urine.  She need someone to speak with her because she's on coumadin and the AVS she received from the ED states that the antibiotic could cause her blood to thin more.  She's quite worried about this because she lives alone and don't want something to happen and no one could get to her.  She's at the pharmacy now and would like a call back.

## 2015-01-07 LAB — URINE CULTURE

## 2015-01-12 NOTE — Telephone Encounter (Signed)
LVM for pt to call back to inform her of below message. Lamonte SakaiZimmerman Rumple, Storm Sovine D

## 2015-01-12 NOTE — Telephone Encounter (Signed)
Ms. Meredith [name] calling to say that she need to have a referral to [facility] with Dr. Barron Alvineavid [name].  Please send in for patient was recently d/c'd from hospital.

## 2015-01-12 NOTE — Telephone Encounter (Signed)
Pt will need appointment to f/u for hematuria prior to referral.  Thanks Judie GrieveBryan R. Paulina FusiHess, DO of [facility] 01/12/2015, 11:46 AM

## 2015-01-19 ENCOUNTER — Encounter: Payer: Self-pay | Admitting: Cardiology

## 2015-01-19 ENCOUNTER — Ambulatory Visit (INDEPENDENT_AMBULATORY_CARE_PROVIDER_SITE_OTHER): Payer: 59 | Admitting: Cardiology

## 2015-01-19 ENCOUNTER — Ambulatory Visit (INDEPENDENT_AMBULATORY_CARE_PROVIDER_SITE_OTHER): Payer: 59 | Admitting: *Deleted

## 2015-01-19 VITALS — BP 160/80 | HR 95 | Ht 63.0 in | Wt 246.8 lb

## 2015-01-19 DIAGNOSIS — I471 Supraventricular tachycardia: Secondary | ICD-10-CM | POA: Insufficient documentation

## 2015-01-19 DIAGNOSIS — Z7901 Long term (current) use of anticoagulants: Secondary | ICD-10-CM

## 2015-01-19 DIAGNOSIS — I10 Essential (primary) hypertension: Secondary | ICD-10-CM

## 2015-01-19 DIAGNOSIS — I499 Cardiac arrhythmia, unspecified: Secondary | ICD-10-CM

## 2015-01-19 DIAGNOSIS — I498 Other specified cardiac arrhythmias: Secondary | ICD-10-CM

## 2015-01-19 DIAGNOSIS — I491 Atrial premature depolarization: Secondary | ICD-10-CM | POA: Insufficient documentation

## 2015-01-19 DIAGNOSIS — R079 Chest pain, unspecified: Secondary | ICD-10-CM

## 2015-01-19 LAB — POCT INR: INR: 2.1

## 2015-01-19 MED ORDER — METOPROLOL SUCCINATE ER 25 MG PO TB24: 25.0000 mg | ORAL_TABLET | Freq: Every day | ORAL | Status: DC

## 2015-01-19 NOTE — Patient Instructions (Signed)
Your physician has recommended you make the following change in your medication:  1) STOP COUMADIN 2) START TOPROL XL 25 mg daily  Your physician has requested that you have a lexiscan myoview. For further information please visit [email or web address]. Please follow instruction sheet, as given.  Your physician has recommended that you wear an event monitor. Event monitors are medical devices that record the heart's electrical activity. Doctors most often us these monitors to diagnose arrhythmias. Arrhythmias are problems with the speed or rhythm of the heartbeat. The monitor is a small, portable device. You can wear one while you do your normal daily activities. This is usually used to diagnose what is causing palpitations/syncope (passing out).  Your physician recommends that you schedule a follow-up appointment in: 2 weeks with an NP or PA  Your physician recommends that you schedule a follow-up appointment in: 3 months with Dr. Mayford Knifeurner

## 2015-01-19 NOTE — Progress Notes (Signed)
Cardiology Office Note   Date:  01/19/2015   ID:  Meredith [name], DOB 09-08-1951, MRN [id number]  PCP:  Kennith Maes, DO  Cardiologist:   Sueanne Margarita, MD   No chief complaint on file.     History of Present Illness: Meredith [name] is a 64 y.o. female who presents for evaluation of new onset atrial fibrilaltion.  She was recently seen by her Pcp for routine PE and was noted to have an irregular heart beat.  EKG showed atrial fibrillation and she was started on Coumadin (no NOAC due to lack of insurance) for a CHADS2VASC score of 3.  .  She is now here for evaluation.  2D echo showed normal LVF with mild LAE and mild LVH.  TSH and BMET were normal.  She has had some pressure in her chest that comes and goes.  It is nonexertional and there is no radiation.  It usually lasts a few seconds and resolves.  She has had some DOE when she walks long distances.  She denies any palpitations, LE edema, dizziness or syncope.      Past Medical History  Diagnosis Date  . Diabetes mellitus   . Hypertension   . Hyperlipidemia   . Osteoarthritis   . ANEMIA, IRON DEFICIENCY, UNSPEC. 02/05/2007    Qualifier: Diagnosis of  By: Damita Dunnings MD, Phillip Heal    . Ventral hernia   . Enteritis   . DJD (degenerative joint disease) of lumbar spine   . Diverticulosis 05/17/12  . Renal cyst, left 05/17/12  . Gastric ulcer 04/2000  . Colon polyps     Past Surgical History  Procedure Laterality Date  . Tubal ligation    . Uterine fibroid surgery    . Operative hysteroscopy    . Abdominal hysterectomy       Current Outpatient Prescriptions  Medication Sig Dispense Refill  . amLODipine (NORVASC) 5 MG tablet TAKE 1 TABLET EVERY DAY 90 tablet 3  . Blood Glucose Monitoring Suppl (CVS BLOOD GLUCOSE METER) W/DEVICE KIT As Directed 1 kit 0  . CVS LANCETS THIN MISC As Directed 200 each 11  . diclofenac sodium (VOLTAREN) 1 % GEL Apply 2 g topically 3 (three) times daily. 100 g 5  . glucose blood (CVS BLOOD GLUCOSE TEST  STRIPS) test strip Use as instructed 100 each 12  . hydrochlorothiazide (HYDRODIURIL) 25 MG tablet TAKE 1 TABLET EVERY DAY 30 tablet 3  . metFORMIN (GLUCOPHAGE-XR) 500 MG 24 hr tablet TAKE 2 TABLETS BY MOUTH 2 TIMES A DAY 180 tablet 3  . pravastatin (PRAVACHOL) 80 MG tablet TAKE 1 TABLET EVERY DAY 90 tablet 3  . PRESCRIPTION MEDICATION Apply 1 application topically 2 (two) times daily as needed (for knee pain. topical cream for pain).    . traMADol (ULTRAM) 50 MG tablet AKE 1 TABLET BY MOUTH EVERY 6 HOURS AS NEEDED 90 tablet 3  . warfarin (COUMADIN) 5 MG tablet TAKE 1 TABLET BY MOUTH DAILY. 30 tablet 11   No current facility-administered medications for this visit.    Allergies:   Flexeril; Lipitor; Lisinopril; and Metaxalone    Social History:  The patient  reports that she quit smoking about 36 years ago. Her smoking use included Cigarettes. She has never used smokeless tobacco. She reports that she does not drink alcohol or use illicit drugs.   Family History:  The patient's family history includes Clotting disorder in her other; Diabetes in her mother; Kidney disease in her mother; Lung cancer in  her father; Stomach cancer in her sister. There is no history of Colon cancer.    ROS:  Please see the history of present illness.   Otherwise, review of systems are positive for none.   All other systems are reviewed and negative.    PHYSICAL EXAM: VS:  BP 160/80 mmHg  Pulse 95  Ht '5\' 3"'  (1.6 m)  Wt 246 lb 12.8 oz (111.948 kg)  BMI 43.73 kg/m2  SpO2 98% , BMI Body mass index is 43.73 kg/(m^2). GEN: Well nourished, well developed, in no acute distress HEENT: normal Neck: no JVD, carotid bruits, or masses Cardiac: RRR; no murmurs, rubs, or gallops,no edema  Respiratory:  clear to auscultation bilaterally, normal work of breathing GI: soft, nontender, nondistended, + BS MS: no deformity or atrophy Skin: warm and dry, no rash Neuro:  Strength and sensation are intact Psych: euthymic  mood, full affect   EKG:  EKG was ordered today. The ekg ordered today demonstrates NSR with frequent PAC's and short bursts of nonsustained atrial tachycardia.   Recent Labs: 11/25/2014: Pro B Natriuretic peptide (BNP) 670.00*; TSH 3.429 01/05/2015: ALT 12; BUN 18; Creatinine 0.72; Hemoglobin 12.8; Platelets 233; Potassium 3.0*; Sodium 142    Lipid Panel    Component Value Date/Time   CHOL 173 11/25/2014 1426   TRIG 135 11/25/2014 1426   HDL 53 11/25/2014 1426   CHOLHDL 3.3 11/25/2014 1426   VLDL 27 11/25/2014 1426   LDLCALC 93 11/25/2014 1426   LDLDIRECT 130* 05/13/2012 0917      Wt Readings from Last 3 Encounters:  01/19/15 246 lb 12.8 oz (111.948 kg)  11/25/14 247 lb 6.4 oz (112.22 kg)  06/23/14 251 lb (113.853 kg)       ASSESSMENT AND PLAN:  1.  ? New onset atrial fibrillation per Resident seeing patient on 11/26/2014.  I have reviewed all of the EKGs from 6/204 until today in EPIC and no EKG shows afib.  The EKG done 11/25/2014 showed NSR with very frequent PAC's and nonsustained atrial tachycardia but no afib.  I do not see any indication for anticoagulation therapy with no documented afib.  I have instructed her to stop the coumadin.  I will get an event monitor to assess for silent PAF.  I have recommended starting Toprol XL 54m daily to suppress nonsustained atrial tachycardia.   2.  HTN - elevated today - will start Toprol for arrhythmia so should help control BP better.  Continue amlodipine/HCTZ 3.  DM 4.  Chest pain - atypical but she does have CRF including post menopausal state, HTN, DM and obesity.  I will get a stress myovew to rule out ischemia  Current medicines are reviewed at length with the patient today.  The patient does not have concerns regarding medicines.  The following changes have been made:  no change  Labs/ tests ordered today include: Lexiscan myoveiw, 30day event monitor     Disposition:   FU with me  in 3 months and PA in 2  weeks   Signed, TSueanne Margarita MD  01/19/2015 11:38 AM    [facility] [address] [place]  [id number]: [phone] Fax: [phone]

## 2015-01-20 ENCOUNTER — Encounter (HOSPITAL_COMMUNITY): Payer: Self-pay | Admitting: Emergency Medicine

## 2015-01-20 ENCOUNTER — Emergency Department (HOSPITAL_COMMUNITY)
Admission: EM | Admit: 2015-01-20 | Discharge: 2015-01-20 | Disposition: A | Discharge status: Home / Self Care | Payer: 59 | Attending: Emergency Medicine | Admitting: Emergency Medicine

## 2015-01-20 DIAGNOSIS — E785 Hyperlipidemia, unspecified: Secondary | ICD-10-CM | POA: Diagnosis not present

## 2015-01-20 DIAGNOSIS — Z862 Personal history of diseases of the blood and blood-forming organs and certain disorders involving the immune mechanism: Secondary | ICD-10-CM | POA: Diagnosis not present

## 2015-01-20 DIAGNOSIS — Z87448 Personal history of other diseases of urinary system: Secondary | ICD-10-CM | POA: Insufficient documentation

## 2015-01-20 DIAGNOSIS — Z8739 Personal history of other diseases of the musculoskeletal system and connective tissue: Secondary | ICD-10-CM | POA: Diagnosis not present

## 2015-01-20 DIAGNOSIS — M5442 Lumbago with sciatica, left side: Secondary | ICD-10-CM

## 2015-01-20 DIAGNOSIS — Z79899 Other long term (current) drug therapy: Secondary | ICD-10-CM | POA: Insufficient documentation

## 2015-01-20 DIAGNOSIS — Z8719 Personal history of other diseases of the digestive system: Secondary | ICD-10-CM | POA: Insufficient documentation

## 2015-01-20 DIAGNOSIS — Q6100 Congenital renal cyst, unspecified: Secondary | ICD-10-CM | POA: Diagnosis not present

## 2015-01-20 DIAGNOSIS — R1030 Lower abdominal pain, unspecified: Secondary | ICD-10-CM | POA: Diagnosis present

## 2015-01-20 DIAGNOSIS — I10 Essential (primary) hypertension: Secondary | ICD-10-CM | POA: Diagnosis not present

## 2015-01-20 DIAGNOSIS — Z87891 Personal history of nicotine dependence: Secondary | ICD-10-CM | POA: Diagnosis not present

## 2015-01-20 DIAGNOSIS — E119 Type 2 diabetes mellitus without complications: Secondary | ICD-10-CM | POA: Diagnosis not present

## 2015-01-20 DIAGNOSIS — Z791 Long term (current) use of non-steroidal anti-inflammatories (NSAID): Secondary | ICD-10-CM | POA: Insufficient documentation

## 2015-01-20 DIAGNOSIS — Z9851 Tubal ligation status: Secondary | ICD-10-CM | POA: Diagnosis not present

## 2015-01-20 DIAGNOSIS — Z86010 Personal history of colonic polyps: Secondary | ICD-10-CM | POA: Diagnosis not present

## 2015-01-20 LAB — CBC WITH DIFFERENTIAL/PLATELET
Basophils Absolute: 0 10*3/uL (ref 0.0–0.1)
Basophils Relative: 0 % (ref 0–1)
EOS ABS: 0.1 10*3/uL (ref 0.0–0.7)
Eosinophils Relative: 2 % (ref 0–5)
HEMATOCRIT: 38.2 % (ref 36.0–46.0)
Hemoglobin: 13.3 g/dL (ref 12.0–15.0)
LYMPHS PCT: 32 % (ref 12–46)
Lymphs Abs: 1.8 10*3/uL (ref 0.7–4.0)
MCH: 26.5 pg (ref 26.0–34.0)
MCHC: 34.8 g/dL (ref 30.0–36.0)
MCV: 76.1 fL — ABNORMAL LOW (ref 78.0–100.0)
MONO ABS: 0.3 10*3/uL (ref 0.1–1.0)
Monocytes Relative: 5 % (ref 3–12)
NEUTROS ABS: 3.4 10*3/uL (ref 1.7–7.7)
NEUTROS PCT: 61 % (ref 43–77)
PLATELETS: 234 10*3/uL (ref 150–400)
RBC: 5.02 MIL/uL (ref 3.87–5.11)
RDW: 14.2 % (ref 11.5–15.5)
WBC: 5.7 10*3/uL (ref 4.0–10.5)

## 2015-01-20 LAB — URINALYSIS, ROUTINE W REFLEX MICROSCOPIC
BILIRUBIN URINE: NEGATIVE
Glucose, UA: NEGATIVE mg/dL
Hgb urine dipstick: NEGATIVE
KETONES UR: NEGATIVE mg/dL
Leukocytes, UA: NEGATIVE
NITRITE: NEGATIVE
Protein, ur: NEGATIVE mg/dL
Specific Gravity, Urine: 1.016 (ref 1.005–1.030)
Urobilinogen, UA: 1 mg/dL (ref 0.0–1.0)
pH: 6 (ref 5.0–8.0)

## 2015-01-20 LAB — COMPREHENSIVE METABOLIC PANEL
ALT: 11 U/L (ref 0–35)
AST: 15 U/L (ref 0–37)
Albumin: 3.8 g/dL (ref 3.5–5.2)
Alkaline Phosphatase: 43 U/L (ref 39–117)
Anion gap: 6 (ref 5–15)
BUN: 14 mg/dL (ref 6–23)
CO2: 31 mmol/L (ref 19–32)
Calcium: 9.7 mg/dL (ref 8.4–10.5)
Chloride: 100 mmol/L (ref 96–112)
Creatinine, Ser: 0.77 mg/dL (ref 0.50–1.10)
GFR calc Af Amer: >90 mL/min (ref >90)
GFR, EST NON AFRICAN AMERICAN: 87 mL/min — AB (ref >90)
GLUCOSE: 134 mg/dL — AB (ref 70–99)
Potassium: 3.3 mmol/L — ABNORMAL LOW (ref 3.5–5.1)
Sodium: 137 mmol/L (ref 135–145)
Total Bilirubin: 0.5 mg/dL (ref 0.3–1.2)
Total Protein: 7.3 g/dL (ref 6.0–8.3)

## 2015-01-20 LAB — PROTIME-INR
INR: 1.97 — AB (ref 0.00–1.49)
Prothrombin Time: 22.6 seconds — ABNORMAL HIGH (ref 11.6–15.2)

## 2015-01-20 LAB — LIPASE, BLOOD: LIPASE: 38 U/L (ref 11–59)

## 2015-01-20 LAB — CBG MONITORING, ED: Glucose-Capillary: 146 mg/dL — ABNORMAL HIGH (ref 70–99)

## 2015-01-20 MED ORDER — HYDROCODONE-ACETAMINOPHEN 5-325 MG PO TABS: 1.0000 | ORAL_TABLET | Freq: Four times a day (QID) | ORAL | Status: DC | PRN

## 2015-01-20 MED ORDER — DIAZEPAM 5 MG PO TABS: 5.0000 mg | ORAL_TABLET | Freq: Three times a day (TID) | ORAL | Status: DC | PRN

## 2015-01-20 NOTE — ED Provider Notes (Signed)
CSN: [id number]     Arrival date & time 01/20/15  0630 History   First MD Initiated Contact with Patient 01/20/15 [phone]     Chief Complaint  Patient presents with  . Flank Pain    The patient has flank pain that started three weeks ago.  She denies any urinary symptoms but did say she had blood in her urine in January.  She advised she was treated and discharged with medications.     (Consider location/radiation/quality/duration/timing/severity/associated sxs/prior Treatment) HPI Comments: Patient presents to the ED with a chief complaint of low back pain.  She states that the pain has been going on for the past 3 weeks.  She states that the pain is mostly in the left low back.  The pain radiates to her left leg and buttock.  She denies any fevers, chills, nausea, vomiting, dysuria.  Denies bowel or bladder incontinence.  The symptoms are aggravated with movement, bending, and twisting.  States the pain is moderate.  The history is provided by the patient. No language interpreter was used.    Past Medical History  Diagnosis Date  . Diabetes mellitus   . Hypertension   . Hyperlipidemia   . Osteoarthritis   . ANEMIA, IRON DEFICIENCY, UNSPEC. 02/05/2007    Qualifier: Diagnosis of  By: Damita Dunnings MD, Phillip Heal    . Ventral hernia   . Enteritis   . DJD (degenerative joint disease) of lumbar spine   . Diverticulosis 05/17/12  . Renal cyst, left 05/17/12  . Gastric ulcer 04/2000  . Colon polyps    Past Surgical History  Procedure Laterality Date  . Tubal ligation    . Uterine fibroid surgery    . Operative hysteroscopy    . Abdominal hysterectomy     Family History  Problem Relation Age of Onset  . Diabetes Mother   . Kidney disease Mother   . Lung cancer Father   . Stomach cancer Sister   . Colon cancer Neg Hx   . Clotting disorder Other     neice   History  Substance Use Topics  . Smoking status: Former Smoker    Types: Cigarettes    Quit date: 12/09/1978  . Smokeless tobacco:  Never Used  . Alcohol Use: No   OB History    No data available     Review of Systems  Constitutional: Negative for fever and chills.  Respiratory: Negative for shortness of breath.   Cardiovascular: Negative for chest pain.  Gastrointestinal: Negative for nausea, vomiting, diarrhea and constipation.  Genitourinary: Positive for flank pain. Negative for dysuria.  All other systems reviewed and are negative.     Allergies  Flexeril; Lipitor; Lisinopril; and Metaxalone  Home Medications   Prior to Admission medications   Medication Sig Start Date End Date Taking? Authorizing Provider  amLODipine (NORVASC) 5 MG tablet TAKE 1 TABLET EVERY DAY 05/03/14   Nolon Rod, DO  Blood Glucose Monitoring Suppl ([facility] BLOOD GLUCOSE METER) W/DEVICE KIT As Directed 11/24/13   Nolon Rod, DO  [facility]S LANCETS THIN MISC As Directed 11/24/13   Nolon Rod, DO  diclofenac sodium (VOLTAREN) 1 % GEL Apply 2 g topically 3 (three) times daily. 04/07/14   Nolon Rod, DO  glucose blood (CVS BLOOD GLUCOSE TEST STRIPS) test strip Use as instructed 11/24/13   Tamela Oddi Hess, DO  hydrochlorothiazide (HYDRODIURIL) 25 MG tablet TAKE 1 TABLET EVERY DAY 10/12/14   Nolon Rod, DO  metFORMIN (GLUCOPHAGE-XR) 500 MG  24 hr tablet TAKE 2 TABLETS BY MOUTH 2 TIMES A DAY 11/25/14   [name] R Hess, DO  metoprolol succinate (TOPROL XL) 25 MG 24 hr tablet Take 1 tablet (25 mg total) by mouth daily. 01/19/15   Sueanne Margarita, MD  pravastatin (PRAVACHOL) 80 MG tablet TAKE 1 TABLET EVERY DAY 09/27/14   Nolon Rod, DO  PRESCRIPTION MEDICATION Apply 1 application topically 2 (two) times daily as needed (for knee pain. topical cream for pain).    Historical Provider, MD  traMADol (ULTRAM) 50 MG tablet AKE 1 TABLET BY MOUTH EVERY 6 HOURS AS NEEDED 11/25/14   [name] R Hess, DO   BP 128/80 mmHg  Pulse 76  Temp(Src) 98.4 F (36.9 C) (Oral)  Resp 20  Wt 247 lb (112.038 kg)  SpO2 100% Physical Exam  Constitutional: She is oriented to  person, place, and time. She appears well-developed and well-nourished. No distress.  HENT:  Head: Normocephalic and atraumatic.  Eyes: Conjunctivae and EOM are normal. Right eye exhibits no discharge. Left eye exhibits no discharge. No scleral icterus.  Neck: Normal range of motion. Neck supple. No tracheal deviation present.  Cardiovascular: Normal rate, regular rhythm and normal heart sounds.  Exam reveals no gallop and no friction rub.   No murmur heard. Pulmonary/Chest: Effort normal and breath sounds normal. No respiratory distress. She has no wheezes.  Abdominal: Soft. She exhibits no distension. There is no tenderness.  Musculoskeletal: Normal range of motion.  Left lumbar paraspinal muscles tender to palpation, no bony tenderness, step-offs, or gross abnormality or deformity of spine, patient is able to ambulate, moves all extremities  Bilateral great toe extension intact Bilateral plantar/dorsiflexion intact  Neurological: She is alert and oriented to person, place, and time. She has normal reflexes.  Sensation and strength intact bilaterally Symmetrical reflexes  Skin: Skin is warm. She is not diaphoretic.  Psychiatric: She has a normal mood and affect. Her behavior is normal. Judgment and thought content normal.  Nursing note and vitals reviewed.   ED Course  Procedures (including critical care time)   EKG Interpretation None      MDM   Final diagnoses:  Left-sided low back pain with left-sided sciatica    Patient with back pain.  No neurological deficits and normal neuro exam.  Patient is ambulatory.  No loss of bowel or bladder control.  Doubt cauda equina.  Denies fever,  doubt epidural abscess or other lesion. Recommend back exercises, stretching, RICE, and will treat with a short course of norco and valium.  Encouraged the patient that there could be a need for additional workup and/or imaging such as MRI, if the symptoms do not resolve. Patient advised that if  the back pain does not resolve, or radiates, this could progress to more serious conditions and is encouraged to follow-up with PCP or orthopedics within 2 weeks.       Montine Circle, PA-C 01/20/15 [id number]  Ernestina Patches, MD 01/23/15 1003

## 2015-01-20 NOTE — Discharge Instructions (Signed)
Back Pain, Adult °Back pain is very common. The pain often gets better over time. The cause of back pain is usually not dangerous. Most people can learn to manage their back pain on their own.  °HOME CARE  °· Stay active. Start with short walks on flat ground if you can. Try to walk farther each day. °· Do not sit, drive, or stand in one place for more than 30 minutes. Do not stay in bed. °· Do not avoid exercise or work. Activity can help your back heal faster. °· Be careful when you bend or lift an object. Bend at your knees, keep the object close to you, and do not twist. °· Sleep on a firm mattress. Lie on your side, and bend your knees. If you lie on your back, put a pillow under your knees. °· Only take medicines as told by your doctor. °· Put ice on the injured area. °· Put ice in a plastic bag. °· Place a towel between your skin and the bag. °· Leave the ice on for 15-20 minutes, 03-04 times a day for the first 2 to 3 days. After that, you can switch between ice and heat packs. °· Ask your doctor about back exercises or massage. °· Avoid feeling anxious or stressed. Find good ways to deal with stress, such as exercise. °GET HELP RIGHT AWAY IF:  °· Your pain does not go away with rest or medicine. °· Your pain does not go away in 1 week. °· You have new problems. °· You do not feel well. °· The pain spreads into your legs. °· You cannot control when you poop (bowel movement) or pee (urinate). °· Your arms or legs feel weak or lose feeling (numbness). °· You feel sick to your stomach (nauseous) or throw up (vomit). °· You have belly (abdominal) pain. °· You feel like you may pass out (faint). °MAKE SURE YOU:  °· Understand these instructions. °· Will watch your condition. °· Will get help right away if you are not doing well or get worse. °Document Released: 05/13/2008 Document Revised: 02/17/2012 Document Reviewed: 03/29/2014 °ExitCare® Patient Information ©2015 [employer]. This information is not intended  to replace advice given to you by your health care provider. Make sure you discuss any questions you have with your health care provider. °Sciatica °Sciatica is pain, weakness, numbness, or tingling along the path of the sciatic nerve. The nerve starts in the lower back and runs down the back of each leg. The nerve controls the muscles in the lower leg and in the back of the knee, while also providing sensation to the back of the thigh, lower leg, and the sole of your foot. Sciatica is a symptom of another medical condition. For instance, nerve damage or certain conditions, such as a herniated disk or bone spur on the spine, pinch or put pressure on the sciatic nerve. This causes the pain, weakness, or other sensations normally associated with sciatica. Generally, sciatica only affects one side of the body. °CAUSES  °· Herniated or slipped disc. °· Degenerative disk disease. °· A pain disorder involving the narrow muscle in the buttocks (piriformis syndrome). °· Pelvic injury or fracture. °· Pregnancy. °· Tumor (rare). °SYMPTOMS  °Symptoms can vary from mild to very severe. The symptoms usually travel from the low back to the buttocks and down the back of the leg. Symptoms can include: °· Mild tingling or dull aches in the lower back, leg, or hip. °· Numbness in the back   of the calf or sole of the foot. °· Burning sensations in the lower back, leg, or hip. °· Sharp pains in the lower back, leg, or hip. °· Leg weakness. °· Severe back pain inhibiting movement. °These symptoms may get worse with coughing, sneezing, laughing, or prolonged sitting or standing. Also, being overweight may worsen symptoms. °DIAGNOSIS  °Your caregiver will perform a physical exam to look for common symptoms of sciatica. He or she may ask you to do certain movements or activities that would trigger sciatic nerve pain. Other tests may be performed to find the cause of the sciatica. These may include: °· Blood tests. °· X-rays. °· Imaging  tests, such as an MRI or CT scan. °TREATMENT  °Treatment is directed at the cause of the sciatic pain. Sometimes, treatment is not necessary and the pain and discomfort goes away on its own. If treatment is needed, your caregiver may suggest: °· Over-the-counter medicines to relieve pain. °· Prescription medicines, such as anti-inflammatory medicine, muscle relaxants, or narcotics. °· Applying heat or ice to the painful area. °· Steroid injections to lessen pain, irritation, and inflammation around the nerve. °· Reducing activity during periods of pain. °· Exercising and stretching to strengthen your abdomen and improve flexibility of your spine. Your caregiver may suggest losing weight if the extra weight makes the back pain worse. °· Physical therapy. °· Surgery to eliminate what is pressing or pinching the nerve, such as a bone spur or part of a herniated disk. °HOME CARE INSTRUCTIONS  °· Only take over-the-counter or prescription medicines for pain or discomfort as directed by your caregiver. °· Apply ice to the affected area for 20 minutes, 3-4 times a day for the first 48-72 hours. Then try heat in the same way. °· Exercise, stretch, or perform your usual activities if these do not aggravate your pain. °· Attend physical therapy sessions as directed by your caregiver. °· Keep all follow-up appointments as directed by your caregiver. °· Do not wear high heels or shoes that do not provide proper support. °· Check your mattress to see if it is too soft. A firm mattress may lessen your pain and discomfort. °SEEK IMMEDIATE MEDICAL CARE IF:  °· You lose control of your bowel or bladder (incontinence). °· You have increasing weakness in the lower back, pelvis, buttocks, or legs. °· You have redness or swelling of your back. °· You have a burning sensation when you urinate. °· You have pain that gets worse when you lie down or awakens you at night. °· Your pain is worse than you have experienced in the past. °· Your  pain is lasting longer than 4 weeks. °· You are suddenly losing weight without reason. °MAKE SURE YOU: °· Understand these instructions. °· Will watch your condition. °· Will get help right away if you are not doing well or get worse. °Document Released: 11/19/2001 Document Revised: 05/26/2012 Document Reviewed: 04/05/2012 °ExitCare® Patient Information ©2015 [employer]. This information is not intended to replace advice given to you by your health care provider. Make sure you discuss any questions you have with your health care provider. ° °

## 2015-01-20 NOTE — ED Notes (Signed)
The patient has flank pain that started three weeks ago.  She denies any urinary symptoms but did say she had blood in her urine in January.  She advised she was treated and discharged with medications.  Today, she presents with flank pain and the pain radiates down her left leg.

## 2015-01-27 ENCOUNTER — Ambulatory Visit (INDEPENDENT_AMBULATORY_CARE_PROVIDER_SITE_OTHER): Payer: 59 | Admitting: Family Medicine

## 2015-01-27 ENCOUNTER — Encounter: Payer: Self-pay | Admitting: Family Medicine

## 2015-01-27 VITALS — BP 164/96 | HR 75 | Temp 97.8°F | Ht 63.0 in | Wt 244.0 lb

## 2015-01-27 DIAGNOSIS — E7800 Pure hypercholesterolemia, unspecified: Secondary | ICD-10-CM

## 2015-01-27 DIAGNOSIS — I499 Cardiac arrhythmia, unspecified: Secondary | ICD-10-CM

## 2015-01-27 DIAGNOSIS — I10 Essential (primary) hypertension: Secondary | ICD-10-CM

## 2015-01-27 NOTE — Assessment & Plan Note (Signed)
Pt to follow with Dr. Mayford Knifeurner, cardiology.  Feels this is more 1st degree AVB with atrial tachycardia/PAC - To have ETT, Myoview, and Holter monitor next week - F/U after completion - Stopped coumadin per cardiology recommendations and continue on Toprol XL 25 mg

## 2015-01-27 NOTE — Assessment & Plan Note (Signed)
Repeat well controlled (120/80 R arm, sitting, manual cuff), continue current regimen.   - Consider addition of ARB due to underlying DM II and HLD since pt cannot tolerate lisinopril - F/U in 3-6 months for repeat

## 2015-01-27 NOTE — Progress Notes (Signed)
Meredith [name] is a 64 y.o. who presents today for generalized physical.  She has no current complaints and has significant PMHx of HTN, HLD, abnormal heart rhythm.   HTN - Compliant with medications, no edema, cough, lightheaded, palpitations.    HLD - Compliant with pravachol, no myalgias   Abnormal Heart Rhythm - Previously found to have abnormal heart rhythm in December 2015 incidentally.  Has been referred to cardiology who feels this is more of a 1st degree AVB with multiple PACs.  Denies palpitations, CP, SOB.  To have myoview, ETT, and holter monitor end of February 2016.   Past Medical History  Diagnosis Date  . Diabetes mellitus   . Hypertension   . Hyperlipidemia   . Osteoarthritis   . ANEMIA, IRON DEFICIENCY, UNSPEC. 02/05/2007    Qualifier: Diagnosis of  By: Damita Dunnings MD, Phillip Heal    . Ventral hernia   . Enteritis   . DJD (degenerative joint disease) of lumbar spine   . Diverticulosis 05/17/12  . Renal cyst, left 05/17/12  . Gastric ulcer 04/2000  . Colon polyps     History   Social History  . Marital Status: Divorced    Spouse Name: N/A  . Number of Children: 4  . Years of Education: N/A   Occupational History  . [employer]    Social History Main Topics  . Smoking status: Former Smoker    Types: Cigarettes    Quit date: 12/09/1978  . Smokeless tobacco: Never Used  . Alcohol Use: No  . Drug Use: No  . Sexual Activity: Not Currently   Other Topics Concern  . Not on file   Social History Narrative    Family History  Problem Relation Age of Onset  . Diabetes Mother   . Kidney disease Mother   . Lung cancer Father   . Stomach cancer Sister   . Colon cancer Neg Hx   . Clotting disorder Other     neice    Current Outpatient Prescriptions on File Prior to Visit  Medication Sig Dispense Refill  . amLODipine (NORVASC) 5 MG tablet TAKE 1 TABLET EVERY DAY 90 tablet 3  . Blood Glucose Monitoring Suppl (CVS BLOOD GLUCOSE METER) W/DEVICE KIT As Directed 1 kit 0  . [facility]  LANCETS THIN MISC As Directed 200 each 11  . diazepam (VALIUM) 5 MG tablet Take 1 tablet (5 mg total) by mouth every 8 (eight) hours as needed for anxiety. 5 tablet 0  . diclofenac sodium (VOLTAREN) 1 % GEL Apply 2 g topically 3 (three) times daily. 100 g 5  . glucose blood (CVS BLOOD GLUCOSE TEST STRIPS) test strip Use as instructed 100 each 12  . hydrochlorothiazide (HYDRODIURIL) 25 MG tablet TAKE 1 TABLET EVERY DAY 30 tablet 3  . HYDROcodone-acetaminophen (NORCO/VICODIN) 5-325 MG per tablet Take 1-2 tablets by mouth every 6 (six) hours as needed. 10 tablet 0  . metFORMIN (GLUCOPHAGE-XR) 500 MG 24 hr tablet TAKE 2 TABLETS BY MOUTH 2 TIMES A DAY 180 tablet 3  . metoprolol succinate (TOPROL XL) 25 MG 24 hr tablet Take 1 tablet (25 mg total) by mouth daily. 30 tablet 6  . pravastatin (PRAVACHOL) 80 MG tablet TAKE 1 TABLET EVERY DAY 90 tablet 3  . PRESCRIPTION MEDICATION Apply 1 application topically 2 (two) times daily as needed (for knee pain. topical cream for pain).    . traMADol (ULTRAM) 50 MG tablet AKE 1 TABLET BY MOUTH EVERY 6 HOURS AS NEEDED 90 tablet 3  No current facility-administered medications on file prior to visit.   Physical Exam Filed Vitals:   01/27/15 1417  BP: 164/96  Pulse: 75  Temp: 97.8 F (36.6 C)   Gen: NAD, Well nourished, Well developed HEENT: PERLA, EOMI, [place]/AT Neck: no JVD Cardio: Irregular irregular, 2/6 diastolic murmur RUSB Lungs: CTA, no wheezes, rhonchi, crackles Abd: NABS, soft nontender nondistended MSK: no erythema/effusion of knee joint.  TTP medial compartment B/L knee. Ligamentous intact, no meniscal provocative testing.  Neuro: CN 2-12 intact, MS 5/5 B/L UE and LE, +2 patellar and achilles relfex b/l  Psych: AAO x 3     Chemistry      Component Value Date/Time   NA 137 01/20/2015 0654   K 3.3* 01/20/2015 0654   CL 100 01/20/2015 0654   CO2 31 01/20/2015 0654   BUN 14 01/20/2015 0654   CREATININE 0.77 01/20/2015 0654   CREATININE 0.81  11/25/2014 1426      Component Value Date/Time   CALCIUM 9.7 01/20/2015 0654   ALKPHOS 43 01/20/2015 0654   AST 15 01/20/2015 0654   ALT 11 01/20/2015 0654   BILITOT 0.5 01/20/2015 0654      Lab Results  Component Value Date   HGBA1C 6.8 11/25/2014

## 2015-01-27 NOTE — Assessment & Plan Note (Signed)
Continue on Pravachol 80 mg qd Cannot afford Crestor and unable to tolerate Lipitor  Ideally needs high dose statin as her LDL 152 in December 2015.

## 2015-02-01 ENCOUNTER — Other Ambulatory Visit: Payer: Self-pay | Admitting: Family Medicine

## 2015-02-02 ENCOUNTER — Encounter (HOSPITAL_BASED_OUTPATIENT_CLINIC_OR_DEPARTMENT_OTHER): Payer: 59

## 2015-02-02 ENCOUNTER — Ambulatory Visit (HOSPITAL_COMMUNITY): Payer: 59 | Attending: Family Medicine | Admitting: Radiology

## 2015-02-02 ENCOUNTER — Encounter: Payer: Self-pay | Admitting: Radiology

## 2015-02-02 DIAGNOSIS — I10 Essential (primary) hypertension: Secondary | ICD-10-CM | POA: Diagnosis not present

## 2015-02-02 DIAGNOSIS — R079 Chest pain, unspecified: Secondary | ICD-10-CM

## 2015-02-02 DIAGNOSIS — I491 Atrial premature depolarization: Secondary | ICD-10-CM | POA: Diagnosis not present

## 2015-02-02 DIAGNOSIS — R9431 Abnormal electrocardiogram [ECG] [EKG]: Secondary | ICD-10-CM

## 2015-02-02 DIAGNOSIS — I471 Supraventricular tachycardia: Secondary | ICD-10-CM | POA: Diagnosis not present

## 2015-02-02 DIAGNOSIS — R002 Palpitations: Secondary | ICD-10-CM | POA: Diagnosis not present

## 2015-02-02 DIAGNOSIS — E119 Type 2 diabetes mellitus without complications: Secondary | ICD-10-CM | POA: Diagnosis not present

## 2015-02-02 MED ORDER — REGADENOSON 0.4 MG/5ML IV SOLN
0.4000 mg | Freq: Once | INTRAVENOUS | Status: AC
Start: 2015-02-02 — End: 2015-02-02
  Administered 2015-02-02: 0.4 mg via INTRAVENOUS

## 2015-02-02 MED ORDER — TECHNETIUM TC 99M SESTAMIBI GENERIC - CARDIOLITE
30.0000 | Freq: Once | INTRAVENOUS | Status: AC | PRN
  Administered 2015-02-02: 30 via INTRAVENOUS

## 2015-02-02 NOTE — Progress Notes (Signed)
Patient ID: Meredith [name], female   DOB: Oct 18, 1951, 64 y.o.   MRN: [id number] Lif[employer] 30 day monitor applied. EOS 03-04-15

## 2015-02-02 NOTE — Progress Notes (Signed)
[facility] SITE 3 NUCLEAR MED [address] [place], [place] [address] [phone]    Cardiology Nuclear Med Study  [name] is a 64 y.o. female     MRN : [id number]     DOB: [birth year]  Procedure Date: 02/02/2015  Nuclear Med Background Indication for Stress Test:  Evaluation for Ischemia and Abnormal EKG History:  06/19/02 MPI: EF: 61% Scar, ASTHMA,  Cardiac Risk Factors: Hypertension and NIDDM  Symptoms:  Palpitations   Nuclear Pre-Procedure Caffeine/Decaff Intake:  None> 12 hrs NPO After: 10:30pm   Lungs:  clear O2 Sat: 96% on room air. IV 0.9% NS with Angio Cath:  20g  IV Site: R Antecubital x 1, tolerated well IV Started by:  Irean HongPatsy Edwards, RN  Chest Size (in):  46 Cup Size: DD  Height: 5\' 3"  (1.6 m)  Weight:  242 lb (109.77 kg)  BMI:  Body mass index is 42.88 kg/(m^2). Tech Comments:  No medications(Metformin) this am. Last dose Toprol was 24 hrs ago. Irean HongPatsy Edwards, RN.    Nuclear Med Study 1 or 2 day study: 2 day  Stress Test Type:  Lexiscan  Reading MD: N/A  Order Authorizing Provider:  Armanda Magicraci Turner, MD  Resting Radionuclide: Technetium 3263m Sestamibi  Resting Radionuclide Dose: 33.0 mCi on 02/09/15   Stress Radionuclide:  Technetium 7963m Sestamibi  Stress Radionuclide Dose: 33.0 mCi on 02/02/15           Stress Protocol Rest HR: 76 Stress HR: 100  Rest BP: 168/110 Stress BP: 185/102  Exercise Time (min): n/a METS: n/a   Predicted Max HR: 156 bpm % Max HR: 67.31 bpm Rate Pressure Product: [id number]   Dose of Adenosine (mg):  n/a Dose of Lexiscan: 0.4 mg  Dose of Atropine (mg): n/a Dose of Dobutamine: n/a mcg/kg/min (at max HR)  Stress Test Technologist: Milana NaSabrina Williams, EMT-P  Nuclear Technologist:  Kerby NoraElzbieta Kubak, CNMT     Rest Procedure:  Myocardial perfusion imaging was performed at rest 45 minutes following the intravenous administration of Technetium 3263m Sestamibi. Rest ECG: NSR - Normal EKG  Stress Procedure:  The patient received IV  Lexiscan 0.4 mg over 15-seconds.  Technetium 5763m Sestamibi injected at 30-seconds. This patient felt weird with the Lexiscan injection. Quantitative spect images were obtained after a 45 minute delay. Stress ECG: Frequent PACs and runs of nonsustained atrial tachycardia  QPS Raw Data Images:  Normal; no motion artifact; normal heart/lung ratio. Stress Images:  there is small area of moderately reduced tracer uptake in the basal and mid inferolateral wall Rest Images:  there is improvement in the inferolateral defect Subtraction (SDS):  These findings are consistent with ischemia. Transient Ischemic Dilatation (Normal <1.22):  0.97 Lung/Heart Ratio (Normal <0.45):  0.20  Quantitative Gated Spect Images QGS EDV:  n/a QGS ESV:  n/a  Impression Exercise Capacity:  Lexiscan with no exercise. BP Response:  Normal blood pressure response. Clinical Symptoms:  No significant symptoms noted. ECG Impression:  No significant ST segment change suggestive of ischemia. Comparison with Prior Nuclear Study: No previous nuclear study performed  Overall Impression:  Intermediate risk stress nuclear study with a moderate area of inferolateral ischemia.  LV Ejection Fraction: Study not gated.  LV Wall Motion:  Study not gated   [name], MD, [facility] [phone] office [phone] pager

## 2015-02-03 ENCOUNTER — Encounter: Payer: Self-pay | Admitting: Nurse Practitioner

## 2015-02-03 ENCOUNTER — Ambulatory Visit (INDEPENDENT_AMBULATORY_CARE_PROVIDER_SITE_OTHER): Payer: 59 | Admitting: Nurse Practitioner

## 2015-02-03 VITALS — BP 180/100 | HR 76 | Ht 63.0 in | Wt 244.0 lb

## 2015-02-03 DIAGNOSIS — I491 Atrial premature depolarization: Secondary | ICD-10-CM

## 2015-02-03 DIAGNOSIS — I471 Supraventricular tachycardia: Secondary | ICD-10-CM

## 2015-02-03 DIAGNOSIS — I10 Essential (primary) hypertension: Secondary | ICD-10-CM

## 2015-02-03 NOTE — Addendum Note (Signed)
Addended by: Rosalio MacadamiaGERHARDT, Gean Laursen C on: 02/03/2015 08:27 AM   Modules accepted: Kipp BroodSmartSet

## 2015-02-03 NOTE — Patient Instructions (Addendum)
Stay on all your medicines  Keep wearing the heart monitor  You will get a call about the results of your stress test  Nurse visit in 2 weeks - bring your BP cuff with you for us to check  Call the [facility] office at [phone] if you have any questions, problems or concerns.

## 2015-02-03 NOTE — Progress Notes (Addendum)
CARDIOLOGY OFFICE NOTE  Date:  02/03/2015    Meredith [name] Date of Birth: [birth year] Medical Record #[id number]  PCP:  Gildardo Cranker, DO  Cardiologist:  Mayford Knife    Chief Complaint  Patient presents with  . Hypertension    2 week check to follow up HTN - seen for Dr. Mayford Knife.      History of Present Illness: JOYOUS [name] is a 64 y.o. female who presents today for a 2 week check. She is seen for Dr. Mayford Knife. She has no past cardiac history noted.   Seen in consultation 2 weeks ago with an irregular heart beat. EKG showed atrial fibrillation and she was started on Coumadin (no NOAC due to lack of insurance) for a CHADS2VASC score of 3.  2D echo showed normal LVF with mild LAE and mild LVH. TSH and BMET were normal. She has had some pressure in her chest that comes and goes.Myoview was ordered. Dr. Mayford Knife reviewed the EKGs - not felt to have had AF. Coumadin stopped. She added Toprol for her rhythm as well as BP.   Comes in today. Here alone. Has not had her medicines for 2 days - was here yesterday for her Myoview - those results are pending. Was told to NOT take her medicines. She notes that she gets really anxious and nervous at the doctor. No chest pain. Not short of breath. No real awareness of palpitations. BP at home about 148/90 - using a wrist cuff.   Past Medical History  Diagnosis Date  . Diabetes mellitus   . Hypertension   . Hyperlipidemia   . Osteoarthritis   . ANEMIA, IRON DEFICIENCY, UNSPEC. 02/05/2007    Qualifier: Diagnosis of  By: Para March MD, Cheree Ditto    . Ventral hernia   . Enteritis   . DJD (degenerative joint disease) of lumbar spine   . Diverticulosis 05/17/12  . Renal cyst, left 05/17/12  . Gastric ulcer 04/2000  . Colon polyps     Past Surgical History  Procedure Laterality Date  . Tubal ligation    . Uterine fibroid surgery    . Operative hysteroscopy    . Abdominal hysterectomy       Medications: Current Outpatient Prescriptions    Medication Sig Dispense Refill  . amLODipine (NORVASC) 5 MG tablet TAKE 1 TABLET EVERY DAY 90 tablet 3  . diazepam (VALIUM) 5 MG tablet Take 1 tablet (5 mg total) by mouth every 8 (eight) hours as needed for anxiety. 5 tablet 0  . diclofenac sodium (VOLTAREN) 1 % GEL Apply 2 g topically 3 (three) times daily. 100 g 5  . glucose blood ([employer]S BLOOD GLUCOSE TEST STRIPS) test strip Use as instructed 100 each 12  . hydrochlorothiazide (HYDRODIURIL) 25 MG tablet TAKE 1 TABLET BY MOUTH EVERY DAY 30 tablet 3  . metFORMIN (GLUCOPHAGE-XR) 500 MG 24 hr tablet TAKE 2 TABLETS BY MOUTH 2 TIMES A DAY 180 tablet 3  . metoprolol succinate (TOPROL XL) 25 MG 24 hr tablet Take 1 tablet (25 mg total) by mouth daily. 30 tablet 6  . pravastatin (PRAVACHOL) 80 MG tablet TAKE 1 TABLET EVERY DAY 90 tablet 3  . traMADol (ULTRAM) 50 MG tablet AKE 1 TABLET BY MOUTH EVERY 6 HOURS AS NEEDED 90 tablet 3   No current facility-administered medications for this visit.    Allergies: Allergies  Allergen Reactions  . Flexeril [Cyclobenzaprine Hcl]     unknown  . Lipitor [Atorvastatin Calcium]  Myalgias  . Lisinopril Swelling  . Metaxalone     Social History: The patient  reports that she quit smoking about 36 years ago. Her smoking use included Cigarettes. She has never used smokeless tobacco. She reports that she does not drink alcohol or use illicit drugs.   Family History: The patient's family history includes Clotting disorder in her other; Diabetes in her mother; Kidney disease in her mother; Lung cancer in her father; Stomach cancer in her sister. There is no history of Colon cancer.   Review of Systems: Please see the history of present illness.    All other systems are reviewed and negative.   Physical Exam: VS:  BP 180/100 mmHg  Pulse 76  Ht  (1.6 m)  Wt 244 lb (110.678 kg)  BMI 43.23 kg/m2 .  BMI Body mass index is 43.23 kg/(m^2).   BP by me is down to 154/90.  Wt Readings from Last 3  Encounters:  02/03/15 244 lb (110.678 kg)  02/02/15 242 lb (109.77 kg)  01/27/15 244 lb (110.678 kg)    General: Pleasant. Well developed, well nourished and in no acute distress. She is obese.  HEENT: Normal. Neck: Supple Cardiac: Regular rate and rhythm. Frequent ectopics. Has an event monitor in place. No murmurs, rubs, or gallops. No edema.  Respiratory:  Lungs are clear to auscultation bilaterally with normal work of breathing.  GI: Soft and nontender.  MS: No deformity or atrophy. Gait and ROM intact. Skin: Warm and dry. Color is normal.  Neuro:  Strength and sensation are intact and no gross focal deficits noted.  Psych: Alert, appropriate and with normal affect.   LABORATORY DATA:  EKG:  EKG is not ordered today.   Lab Results  Component Value Date   WBC 5.7 01/20/2015   HGB 13.3 01/20/2015   HCT 38.2 01/20/2015   PLT 234 01/20/2015   GLUCOSE 134* 01/20/2015   CHOL 173 11/25/2014   TRIG 135 11/25/2014   HDL 53 11/25/2014   LDLDIRECT 130* 05/13/2012   LDLCALC 93 11/25/2014   ALT 11 01/20/2015   AST 15 01/20/2015   NA 137 01/20/2015   K 3.3* 01/20/2015   CL 100 01/20/2015   CREATININE 0.77 01/20/2015   BUN 14 01/20/2015   CO2 31 01/20/2015   TSH 3.429 11/25/2014   INR 1.97* 01/20/2015   HGBA1C 6.8 11/25/2014    BNP (last 3 results) No results for input(s): BNP in the last 8760 hours.  ProBNP (last 3 results)  Recent Labs  11/25/14 1559  PROBNP 670.00*     Other Studies Reviewed Today:   Assessment/Plan: 1. Nonsustained atrial tachycardia with PACs - she has an event monitor on - no longer on coumadin.   2. HTN - elevated - she has not had her medicines since yesterday -  I have asked her to restart. Arrange nurse visit for about 2 weeks and she is to bring her cuff to check for correlation.   3. DM  4. Chest pain - stress myovew to rule out ischemia -  These results are pending.   Current medicines are reviewed with the patient today.   The patient does not have concerns regarding medicines other than what has been noted above.  The following changes have been made:  See above.  Labs/ tests ordered today include:   No orders of the defined types were placed in this encounter.     Disposition:   FU with Dr. Mayford Knife in May as  planned.  Patient is agreeable to this plan and will call if any problems develop in the interim.   Signed: Rosalio MacadamiaLori C. Gregroy Dombkowski, RN, ANP-C 02/03/2015 8:15 AM  [facility] [address] [place], [place]  [address] Phone: [phone] Fax: [phone]

## 2015-02-09 ENCOUNTER — Ambulatory Visit (HOSPITAL_COMMUNITY): Payer: 59 | Attending: Family Medicine

## 2015-02-09 DIAGNOSIS — R0989 Other specified symptoms and signs involving the circulatory and respiratory systems: Secondary | ICD-10-CM

## 2015-02-09 MED ORDER — TECHNETIUM TC 99M SESTAMIBI GENERIC - CARDIOLITE
30.0000 | Freq: Once | INTRAVENOUS | Status: AC | PRN
  Administered 2015-02-09: 30 via INTRAVENOUS

## 2015-02-13 ENCOUNTER — Telehealth: Payer: Self-pay

## 2015-02-13 NOTE — Telephone Encounter (Signed)
-----   Message from Quintella Reichertraci R Turner, MD sent at 02/10/2015  3:04 PM EST ----- Please let patient know that stress test was abnormal - please set up for PA to see to discuss cardiac cath

## 2015-02-13 NOTE — Telephone Encounter (Signed)
Patient informed of results and verbal understanding expressed.  OV scheduled tomorrow at 1400.  Patient agrees with treatment plan.

## 2015-02-14 ENCOUNTER — Ambulatory Visit (INDEPENDENT_AMBULATORY_CARE_PROVIDER_SITE_OTHER): Payer: Commercial Managed Care - HMO | Admitting: Cardiology

## 2015-02-14 ENCOUNTER — Ambulatory Visit
Admission: RE | Admit: 2015-02-14 | Discharge: 2015-02-14 | Disposition: A | Discharge status: Home / Self Care | Payer: 59 | Source: Ambulatory Visit | Attending: Cardiology | Admitting: Cardiology

## 2015-02-14 ENCOUNTER — Encounter: Payer: Self-pay | Admitting: Cardiology

## 2015-02-14 VITALS — BP 150/78 | HR 79 | Ht 63.0 in | Wt 249.2 lb

## 2015-02-14 DIAGNOSIS — Z9889 Other specified postprocedural states: Secondary | ICD-10-CM | POA: Diagnosis not present

## 2015-02-14 DIAGNOSIS — Z01818 Encounter for other preprocedural examination: Secondary | ICD-10-CM | POA: Diagnosis not present

## 2015-02-14 DIAGNOSIS — R079 Chest pain, unspecified: Secondary | ICD-10-CM | POA: Diagnosis not present

## 2015-02-14 LAB — BASIC METABOLIC PANEL
BUN: 17 mg/dL (ref 6–23)
CO2: 33 meq/L — AB (ref 19–32)
Calcium: 10 mg/dL (ref 8.4–10.5)
Chloride: 101 mEq/L (ref 96–112)
Creatinine, Ser: 0.72 mg/dL (ref 0.40–1.20)
GFR: 104.56 mL/min (ref >60.00)
Glucose, Bld: 115 mg/dL — ABNORMAL HIGH (ref 70–99)
Potassium: 3.7 mEq/L (ref 3.5–5.1)
Sodium: 138 mEq/L (ref 135–145)

## 2015-02-14 LAB — CBC WITH DIFFERENTIAL/PLATELET
Basophils Absolute: 0 10*3/uL (ref 0.0–0.1)
Basophils Relative: 0.3 % (ref 0.0–3.0)
EOS ABS: 0.1 10*3/uL (ref 0.0–0.7)
Eosinophils Relative: 1.5 % (ref 0.0–5.0)
HCT: 37.8 % (ref 36.0–46.0)
Hemoglobin: 12.6 g/dL (ref 12.0–15.0)
LYMPHS PCT: 26.2 % (ref 12.0–46.0)
Lymphs Abs: 2 10*3/uL (ref 0.7–4.0)
MCHC: 33.3 g/dL (ref 30.0–36.0)
MCV: 78.9 fl (ref 78.0–100.0)
MONO ABS: 0.3 10*3/uL (ref 0.1–1.0)
Monocytes Relative: 4.7 % (ref 3.0–12.0)
NEUTROS PCT: 67.3 % (ref 43.0–77.0)
Neutro Abs: 5 10*3/uL (ref 1.4–7.7)
PLATELETS: 218 10*3/uL (ref 150.0–400.0)
RBC: 4.79 Mil/uL (ref 3.87–5.11)
RDW: 14.5 % (ref 11.5–15.5)
WBC: 7.5 10*3/uL (ref 4.0–10.5)

## 2015-02-14 LAB — PROTIME-INR
INR: 1 ratio (ref 0.8–1.0)
Prothrombin Time: 11.5 s (ref 9.6–13.1)

## 2015-02-14 LAB — APTT: aPTT: 30.5 s (ref 23.4–32.7)

## 2015-02-14 NOTE — Progress Notes (Signed)
02/14/2015 Meredith MateBetty K [name]   04/05/1951  [id number]  Primary Physician [name], [name], DO Primary Cardiologist: Dr. Excell Seltzerooper  Reason For Visit/CC: F/U for Abnormal Stress Test  HPI:  The patient is a 64 year old female, followed by Dr. Mayford Knifeurner, with a history of diabetes, hypertension and hyperlipidemia. She was seen in consultation 3 weeks ago with an irregular heart beat. She was seen by Dr. Mayford Knifeurner and it was determined that her EKGs and telemetry showed PACs. 2D echo showed normal LVF with mild LAE and mild LVH. TSH and BMET were normal. Metoprolol was added for her rhythm as well as for blood pressure. The patient had also endorsed some pressure in her chest that comes and goes.Myoview was ordered. This was interpreted as an intermediate risk stress nuclear study with a moderate area of inferolateral ischemia.  She presents to clinic today for follow-up and to review her results. She denies any recent chest discomfort. No dyspnea, lightheadedness, dizziness, syncope/near-syncope. She reports full medication compliance. She denies any medication allergies.   Current Outpatient Prescriptions  Medication Sig Dispense Refill  . amLODipine (NORVASC) 5 MG tablet TAKE 1 TABLET EVERY DAY 90 tablet 3  . diazepam (VALIUM) 5 MG tablet Take 1 tablet (5 mg total) by mouth every 8 (eight) hours as needed for anxiety. 5 tablet 0  . glucose blood ([employer]S BLOOD GLUCOSE TEST STRIPS) test strip Use as instructed 100 each 12  . hydrochlorothiazide (HYDRODIURIL) 25 MG tablet TAKE 1 TABLET BY MOUTH EVERY DAY 30 tablet 3  . metFORMIN (GLUCOPHAGE-XR) 500 MG 24 hr tablet TAKE 2 TABLETS BY MOUTH 2 TIMES A DAY 180 tablet 3  . metoprolol succinate (TOPROL XL) 25 MG 24 hr tablet Take 1 tablet (25 mg total) by mouth daily. 30 tablet 6  . pravastatin (PRAVACHOL) 80 MG tablet TAKE 1 TABLET EVERY DAY 90 tablet 3  . traMADol (ULTRAM) 50 MG tablet [name] 1 TABLET BY MOUTH EVERY 6 HOURS AS NEEDED 90 tablet 3   No current  facility-administered medications for this visit.    Allergies  Allergen Reactions  . Flexeril [Cyclobenzaprine Hcl]     unknown  . Lipitor [Atorvastatin Calcium]     Myalgias  . Lisinopril Swelling  . Metaxalone     History   Social History  . Marital Status: Divorced    Spouse Name: N/A  . Number of Children: 4  . Years of Education: N/A   Occupational History  . [employer]    Social History Main Topics  . Smoking status: Former Smoker    Types: Cigarettes    Quit date: 12/09/1978  . Smokeless tobacco: Never Used  . Alcohol Use: No  . Drug Use: No  . Sexual Activity: Not Currently   Other Topics Concern  . Not on file   Social History Narrative    Family History  Problem Relation Age of Onset  . Diabetes Mother   . Kidney disease Mother   . Lung cancer Father   . Stomach cancer Sister   . Colon cancer Neg Hx   . Clotting disorder Other     neice     Review of Systems: General: negative for chills, fever, night sweats or weight changes.  Cardiovascular: negative for chest pain, dyspnea on exertion, edema, orthopnea, palpitations, paroxysmal nocturnal dyspnea or shortness of breath Dermatological: negative for rash Respiratory: negative for cough or wheezing Urologic: negative for hematuria Abdominal: negative for nausea, vomiting, diarrhea, bright red blood per rectum, melena, or hematemesis Neurologic:  negative for visual changes, syncope, or dizziness All other systems reviewed and are otherwise negative except as noted above.    Blood pressure 150/78, pulse 79, height 5\' 3"  (1.6 m), weight 249 lb 3.2 oz (113.036 kg), SpO2 99 %.  General appearance: alert, cooperative and no distress Neck: no carotid bruit and no JVD Lungs: clear to auscultation bilaterally Heart: regular rate and rhythm, S1, S2 normal, no murmur, click, rub or gallop Extremities: no LEE Pulses: 2+ and symmetric Skin: warm and dry Neurologic: Grossly normal  EKG not  performed  ASSESSMENT AND PLAN:   1. Abnormal stress tests: Intermediate risk stress nuclear study with a moderate area of inferolateral ischemia. We've discussed proceeding with further evaluation by left heart catheterization. I outlined procedural details and also notified patient of potential risk. She is in agreement to proceeding with catheterization. [facility].   2. Diabetes: Followed PC. She has been instructed to hold metformin morning of cath.  3. HTN: Followed by PCP. Patient instructed to hold hydrochlorothiazide morning of cath.  5. HLD: followed by PCP. Continue statin therapy.   PLAN  [facility]. F/u with Dr. Mayford Knifeurner 1-2 weeks after procedure.   SIMMONS, BRITTAINYPA-C 02/14/2015 2:35 PM

## 2015-02-14 NOTE — Patient Instructions (Signed)
You are scheduled for a cardiac catheterization on Thursday, March 10 with Dr. Clifton JamesMcAlhany or associate.  [facility] on March 10 at 10:00 am procedure is at 12:00. Enter thru the [employer] [facility] entrance A No food or drink after midnight on March 9th.  You may take your medications with a sip of water on the day of your procedure EXCEPT HOLD METFORMIN AND HYDROCHLOROTHIAZIDE  Your physician recommends that you have lab work today: pt/inr/ptt/cbc/bmet  A chest x-ray takes a picture of the organs and structures inside the chest, including the heart, lungs, and blood vessels. This test can show several things, including, whether the heart is enlarges; whether fluid is building up in the lungs; and whether pacemaker / defibrillator leads are still in place.

## 2015-02-16 ENCOUNTER — Encounter (HOSPITAL_COMMUNITY): Payer: Self-pay | Admitting: Cardiovascular Disease

## 2015-02-16 ENCOUNTER — Ambulatory Visit (HOSPITAL_COMMUNITY)
Admission: RE | Admit: 2015-02-16 | Discharge: 2015-02-16 | Disposition: A | Discharge status: Home / Self Care | Payer: Commercial Managed Care - HMO | Source: Ambulatory Visit | Attending: Cardiovascular Disease | Admitting: Cardiovascular Disease

## 2015-02-16 ENCOUNTER — Encounter (HOSPITAL_COMMUNITY)
Admission: RE | Disposition: A | Discharge status: Home / Self Care | Payer: Self-pay | Source: Ambulatory Visit | Attending: Cardiovascular Disease

## 2015-02-16 DIAGNOSIS — E119 Type 2 diabetes mellitus without complications: Secondary | ICD-10-CM | POA: Insufficient documentation

## 2015-02-16 DIAGNOSIS — I200 Unstable angina: Secondary | ICD-10-CM | POA: Diagnosis not present

## 2015-02-16 DIAGNOSIS — I4891 Unspecified atrial fibrillation: Secondary | ICD-10-CM | POA: Insufficient documentation

## 2015-02-16 DIAGNOSIS — Z79899 Other long term (current) drug therapy: Secondary | ICD-10-CM | POA: Insufficient documentation

## 2015-02-16 DIAGNOSIS — Z87891 Personal history of nicotine dependence: Secondary | ICD-10-CM | POA: Diagnosis not present

## 2015-02-16 DIAGNOSIS — Z791 Long term (current) use of non-steroidal anti-inflammatories (NSAID): Secondary | ICD-10-CM | POA: Diagnosis not present

## 2015-02-16 DIAGNOSIS — I10 Essential (primary) hypertension: Secondary | ICD-10-CM | POA: Insufficient documentation

## 2015-02-16 DIAGNOSIS — I25110 Atherosclerotic heart disease of native coronary artery with unstable angina pectoris: Secondary | ICD-10-CM | POA: Diagnosis not present

## 2015-02-16 DIAGNOSIS — E785 Hyperlipidemia, unspecified: Secondary | ICD-10-CM | POA: Diagnosis not present

## 2015-02-16 DIAGNOSIS — Z833 Family history of diabetes mellitus: Secondary | ICD-10-CM | POA: Insufficient documentation

## 2015-02-16 DIAGNOSIS — I2510 Atherosclerotic heart disease of native coronary artery without angina pectoris: Secondary | ICD-10-CM

## 2015-02-16 DIAGNOSIS — R9439 Abnormal result of other cardiovascular function study: Secondary | ICD-10-CM | POA: Diagnosis present

## 2015-02-16 HISTORY — DX: Atherosclerotic heart disease of native coronary artery without angina pectoris: I25.10

## 2015-02-16 HISTORY — PX: LEFT HEART CATHETERIZATION WITH CORONARY ANGIOGRAM: SHX5451

## 2015-02-16 LAB — GLUCOSE, CAPILLARY: GLUCOSE-CAPILLARY: 156 mg/dL — AB (ref 70–99)

## 2015-02-16 SURGERY — LEFT HEART CATHETERIZATION WITH CORONARY ANGIOGRAM
Anesthesia: LOCAL

## 2015-02-16 MED ORDER — METFORMIN HCL 500 MG PO TABS: 1000.0000 mg | ORAL_TABLET | Freq: Two times a day (BID) | ORAL | Status: DC

## 2015-02-16 MED ORDER — VERAPAMIL HCL 2.5 MG/ML IV SOLN
INTRAVENOUS | Status: AC
  Filled 2015-02-16: qty 2

## 2015-02-16 MED ORDER — SODIUM CHLORIDE 0.9 % IJ SOLN: 3.0000 mL | Freq: Two times a day (BID) | INTRAMUSCULAR | Status: DC

## 2015-02-16 MED ORDER — SODIUM CHLORIDE 0.9 % IJ SOLN: 3.0000 mL | INTRAMUSCULAR | Status: DC | PRN

## 2015-02-16 MED ORDER — NITROGLYCERIN 1 MG/10 ML FOR IR/CATH LAB
INTRA_ARTERIAL | Status: AC
  Filled 2015-02-16: qty 10

## 2015-02-16 MED ORDER — SODIUM CHLORIDE 0.9 % IV SOLN: 250.0000 mL | INTRAVENOUS | Status: DC | PRN

## 2015-02-16 MED ORDER — FENTANYL CITRATE 0.05 MG/ML IJ SOLN
INTRAMUSCULAR | Status: AC
  Filled 2015-02-16: qty 2

## 2015-02-16 MED ORDER — SODIUM CHLORIDE 0.9 % IV SOLN
INTRAVENOUS | Status: DC
  Administered 2015-02-16: 75 mL via INTRAVENOUS

## 2015-02-16 MED ORDER — LIDOCAINE HCL (PF) 1 % IJ SOLN
INTRAMUSCULAR | Status: AC
  Filled 2015-02-16: qty 30

## 2015-02-16 MED ORDER — ASPIRIN 81 MG PO CHEW
CHEWABLE_TABLET | ORAL | Status: AC
  Filled 2015-02-16: qty 1

## 2015-02-16 MED ORDER — ASPIRIN 81 MG PO CHEW
81.0000 mg | CHEWABLE_TABLET | ORAL | Status: AC
  Administered 2015-02-16: 81 mg via ORAL

## 2015-02-16 MED ORDER — SODIUM CHLORIDE 0.9 % IV SOLN: INTRAVENOUS | Status: AC

## 2015-02-16 MED ORDER — HEPARIN (PORCINE) IN NACL 2-0.9 UNIT/ML-% IJ SOLN
INTRAMUSCULAR | Status: AC
  Filled 2015-02-16: qty 1000

## 2015-02-16 MED ORDER — HEPARIN SODIUM (PORCINE) 1000 UNIT/ML IJ SOLN
INTRAMUSCULAR | Status: AC
  Filled 2015-02-16: qty 1

## 2015-02-16 MED ORDER — MIDAZOLAM HCL 2 MG/2ML IJ SOLN
INTRAMUSCULAR | Status: AC
  Filled 2015-02-16: qty 2

## 2015-02-16 NOTE — Interval H&P Note (Signed)
History and Physical Interval Note:  02/16/2015 12:10 PM  Meredith [name]  has presented today for cardiac cath with the diagnosis of unstable angina, abnormal stress test  The various methods of treatment have been discussed with the patient and family. After consideration of risks, benefits and other options for treatment, the patient has consented to  Procedure(s): LEFT HEART CATHETERIZATION WITH CORONARY ANGIOGRAM (N/A) as a surgical intervention .  The patient's history has been reviewed, patient examined, no change in status, stable for surgery.  I have reviewed the patient's chart and labs.  Questions were answered to the patient's satisfaction.    Cath Lab Visit (complete for each Cath Lab visit)  Clinical Evaluation Leading to the Procedure:   ACS: No.  Non-ACS:    Anginal Classification: CCS II  Anti-ischemic medical therapy: Maximal Therapy (2 or more classes of medications)  Non-Invasive Test Results: Intermediate-risk stress test findings: cardiac mortality 1-3%/year  Prior CABG: No previous CABG        Meredith [name]

## 2015-02-16 NOTE — CV Procedure (Signed)
      Cardiac Catheterization Operative Report  Meredith [name] [id number] 3/10/201612:43 PM [name], [name], DO  Procedure Performed:  1. Left Heart Catheterization 2. Selective Coronary Angiography 3. Left ventricular angiogram  Operator: [name], MD  Arterial access site:  Right radial artery.   Indication: 64 yo female with history of atrial fibrillation, DM, HTN, HLD with recent chest pain concerning for unstable angina. Stress myoview with possible ischemia.                                    Procedure Details: The risks, benefits, complications, treatment options, and expected outcomes were discussed with the patient. The patient and/or family concurred with the proposed plan, giving informed consent. The patient was brought to the cath lab after IV hydration was begun and oral premedication was given. The patient was further sedated with Versed and Fentanyl. The right wrist was assessed with a modified Allens test which was positive. The right wrist was prepped and draped in a sterile fashion. 1% lidocaine was used for local anesthesia. Using the modified Seldinger access technique, a 5 French sheath was placed in the right radial artery. 3 mg Verapamil was given through the sheath. 5000 units IV heparin was given. Standard diagnostic catheters were used to perform selective coronary angiography. A pigtail catheter was used to perform a left ventricular angiogram. The sheath was removed from the right radial artery and a Terumo hemostasis band was applied at the arteriotomy site on the right wrist.   There were no immediate complications. The patient was taken to the recovery area in stable condition.   Hemodynamic Findings: Central aortic pressure: 143/72 Left ventricular pressure: 147/0/4  Angiographic Findings:  Left main: No obstructive disease.   Left Anterior Descending Artery: Large caliber vessel that courses to the apex. There are two small to moderate  caliber diagonal branches. No obstructive disease.   Circumflex Artery: Large caliber vessel with 20% proximal stenosis. The large caliber first obtuse marginal branch has 20% mid stenosis.   Right Coronary Artery: Large dominant vessel with smooth 20% mid stenosis.   Left Ventricular Angiogram: LVEF=55-60%  Impression: 1. Mild non-obstructive CAD 2. Normal LV function  Recommendations: Medical management of mild CAD. Of note, pt in atrial fibrillation during procedure. Further management of atrial fibrillation at f/u visit with Dr. Mayford Knifeurner.        Complications:  None. The patient tolerated the procedure well.

## 2015-02-16 NOTE — Discharge Instructions (Signed)

## 2015-02-16 NOTE — H&P (View-Only) (Signed)
02/14/2015 Meredith [name]   04/05/1951  [id number]  Primary Physician [name], [name], DO Primary Cardiologist: Dr. Excell Seltzerooper  Reason For Visit/CC: F/U for Abnormal Stress Test  HPI:  The patient is a 64 year old female, followed by Dr. Mayford Knifeurner, with a history of diabetes, hypertension and hyperlipidemia. She was seen in consultation 3 weeks ago with an irregular heart beat. She was seen by Dr. Mayford Knifeurner and it was determined that her EKGs and telemetry showed PACs. 2D echo showed normal LVF with mild LAE and mild LVH. TSH and BMET were normal. Metoprolol was added for her rhythm as well as for blood pressure. The patient had also endorsed some pressure in her chest that comes and goes.Myoview was ordered. This was interpreted as an intermediate risk stress nuclear study with a moderate area of inferolateral ischemia.  She presents to clinic today for follow-up and to review her results. She denies any recent chest discomfort. No dyspnea, lightheadedness, dizziness, syncope/near-syncope. She reports full medication compliance. She denies any medication allergies.   Current Outpatient Prescriptions  Medication Sig Dispense Refill  . amLODipine (NORVASC) 5 MG tablet TAKE 1 TABLET EVERY DAY 90 tablet 3  . diazepam (VALIUM) 5 MG tablet Take 1 tablet (5 mg total) by mouth every 8 (eight) hours as needed for anxiety. 5 tablet 0  . glucose blood ([employer]S BLOOD GLUCOSE TEST STRIPS) test strip Use as instructed 100 each 12  . hydrochlorothiazide (HYDRODIURIL) 25 MG tablet TAKE 1 TABLET BY MOUTH EVERY DAY 30 tablet 3  . metFORMIN (GLUCOPHAGE-XR) 500 MG 24 hr tablet TAKE 2 TABLETS BY MOUTH 2 TIMES A DAY 180 tablet 3  . metoprolol succinate (TOPROL XL) 25 MG 24 hr tablet Take 1 tablet (25 mg total) by mouth daily. 30 tablet 6  . pravastatin (PRAVACHOL) 80 MG tablet TAKE 1 TABLET EVERY DAY 90 tablet 3  . traMADol (ULTRAM) 50 MG tablet [name] 1 TABLET BY MOUTH EVERY 6 HOURS AS NEEDED 90 tablet 3   No current  facility-administered medications for this visit.    Allergies  Allergen Reactions  . Flexeril [Cyclobenzaprine Hcl]     unknown  . Lipitor [Atorvastatin Calcium]     Myalgias  . Lisinopril Swelling  . Metaxalone     History   Social History  . Marital Status: Divorced    Spouse Name: N/A  . Number of Children: 4  . Years of Education: N/A   Occupational History  . [employer]    Social History Main Topics  . Smoking status: Former Smoker    Types: Cigarettes    Quit date: 12/09/1978  . Smokeless tobacco: Never Used  . Alcohol Use: No  . Drug Use: No  . Sexual Activity: Not Currently   Other Topics Concern  . Not on file   Social History Narrative    Family History  Problem Relation Age of Onset  . Diabetes Mother   . Kidney disease Mother   . Lung cancer Father   . Stomach cancer Sister   . Colon cancer Neg Hx   . Clotting disorder Other     neice     Review of Systems: General: negative for chills, fever, night sweats or weight changes.  Cardiovascular: negative for chest pain, dyspnea on exertion, edema, orthopnea, palpitations, paroxysmal nocturnal dyspnea or shortness of breath Dermatological: negative for rash Respiratory: negative for cough or wheezing Urologic: negative for hematuria Abdominal: negative for nausea, vomiting, diarrhea, bright red blood per rectum, melena, or hematemesis Neurologic:  negative for visual changes, syncope, or dizziness All other systems reviewed and are otherwise negative except as noted above.    Blood pressure 150/78, pulse 79, height 5\' 3"  (1.6 m), weight 249 lb 3.2 oz (113.036 kg), SpO2 99 %.  General appearance: alert, cooperative and no distress Neck: no carotid bruit and no JVD Lungs: clear to auscultation bilaterally Heart: regular rate and rhythm, S1, S2 normal, no murmur, click, rub or gallop Extremities: no LEE Pulses: 2+ and symmetric Skin: warm and dry Neurologic: Grossly normal  EKG not  performed  ASSESSMENT AND PLAN:   1. Abnormal stress tests: Intermediate risk stress nuclear study with a moderate area of inferolateral ischemia. We've discussed proceeding with further evaluation by left heart catheterization. I outlined procedural details and also notified patient of potential risk. She is in agreement to proceeding with catheterization. Ar[facility].   2. Diabetes: Followed PC. She has been instructed to hold metformin morning of cath.  3. HTN: Followed by PCP. Patient instructed to hold hydrochlorothiazide morning of cath.  5. HLD: followed by PCP. Continue statin therapy.   PLAN  [facility]. F/u with Dr. Mayford Knifeurner 1-2 weeks after procedure.   SIMMONS, BRITTAINYPA-C 02/14/2015 2:35 PM

## 2015-02-17 ENCOUNTER — Ambulatory Visit (INDEPENDENT_AMBULATORY_CARE_PROVIDER_SITE_OTHER): Payer: 59 | Admitting: *Deleted

## 2015-02-17 ENCOUNTER — Telehealth: Payer: Self-pay | Admitting: Cardiology

## 2015-02-17 VITALS — BP 140/82 | HR 72 | Ht 63.0 in | Wt 242.8 lb

## 2015-02-17 DIAGNOSIS — I10 Essential (primary) hypertension: Secondary | ICD-10-CM

## 2015-02-17 NOTE — Telephone Encounter (Signed)
OV made Thursday with Meredith [name] per patient request.

## 2015-02-17 NOTE — Telephone Encounter (Signed)
Per Dr. Mayford Knifeurner, patient to schedule OV with extender ASAP.   Left message to call back.

## 2015-02-17 NOTE — Telephone Encounter (Signed)
New message     Pt had a heart cath yesterday.  She also is wearing a 30 day monitor.  Can she turn the monitor back in or will she still have to wear it?

## 2015-02-17 NOTE — Progress Notes (Signed)
PT   HERE  TODAY  FOR  B/P  CHECK  SEE  VITAL SIGNS   DISCUSSED  WITH  DR  TURNER  B/P LOOKS  GOOD  NO CHANGES  PT   ADVISED .Zack Seal/CY

## 2015-02-18 ENCOUNTER — Telehealth: Payer: Self-pay | Admitting: Cardiology

## 2015-02-18 NOTE — Telephone Encounter (Signed)
Pt called saying she needed batteries for her monitor. I asked her to call the number on the monitor.   Corine ShelterLUKE Bradin Mcadory PA-C 02/18/2015 12:47 PM

## 2015-02-23 ENCOUNTER — Encounter: Payer: Self-pay | Admitting: Family Medicine

## 2015-02-23 ENCOUNTER — Encounter: Payer: Self-pay | Admitting: Nurse Practitioner

## 2015-02-23 ENCOUNTER — Ambulatory Visit (INDEPENDENT_AMBULATORY_CARE_PROVIDER_SITE_OTHER): Payer: 59 | Admitting: Nurse Practitioner

## 2015-02-23 VITALS — BP 138/90 | HR 90 | Ht 63.0 in | Wt 245.8 lb

## 2015-02-23 DIAGNOSIS — I480 Paroxysmal atrial fibrillation: Secondary | ICD-10-CM

## 2015-02-23 DIAGNOSIS — I4891 Unspecified atrial fibrillation: Secondary | ICD-10-CM

## 2015-02-23 DIAGNOSIS — I4819 Other persistent atrial fibrillation: Secondary | ICD-10-CM | POA: Insufficient documentation

## 2015-02-23 MED ORDER — RIVAROXABAN 20 MG PO TABS: 20.0000 mg | ORAL_TABLET | Freq: Every day | ORAL | Status: DC

## 2015-02-23 NOTE — Progress Notes (Signed)
Pt was seen at heart care across the street today @ 8:30 and she was told that they still needed the referral from the PCP asap, they still seen her but a referral is needed, pt is requesting to be informed when it is complete @ [phone]

## 2015-02-23 NOTE — Progress Notes (Signed)
I have [facility], authorization has been suspended waiting for authorization confirmation.

## 2015-02-23 NOTE — Progress Notes (Signed)
Primary Care Physician: Gildardo Cranker, DO Referring Physician:   MONTINA DORRANCE is a 64 y.o. female with a h/o HTN, obseity, DM that recently had a cardiac catheterization  3/10 that showed afib during the procedure and is being seen in the afib clinic for this finding. She currently is wearing a 30 day event monitor. Pt is a very poor historian and could not remember being told she had an irregular heart beat but said it was years ago that she was on coumadin and was stopped due to bad bruising and blood in urine. Review of Epic records show that she was evaluated for afib when  pt presented to  PCP in 12/15 and EKG showed irregular rhythm. She placed on coumadin shortly after this but definitely pt did not like coumadin and after ER visit with DX of pylo,mild hematuria, coumadin was stopped. However, she went on to have cardiac w/u and subsequently had cath which showed Non obstructive CAD, and afib was seen during procedure. Reviewing further records in EPIC she was seen in ER 12/08/11 and had afib documented on EKG, presenting with tachycardia, CXR, showing mild CHF Do to turning 64 within the week, she now has a chads2vasc score of 4(HTN,Female,DM, Age). She definitely does not want to go back to use of coumadin but is open to the idea of taking a NOAC, as she does not want to have a stroke.. As of the first of March, she now has medicare and hopefully will be able to afford the medicine. She does not appear to be aware of any irregular heart beat. No episodes of unexplained dyspnea/fatigue. She does work long hours 3x week at [employer]. I did explain the new blood thinners to the pt and she prefers to take Xarelto due to being once a day. "I have trouble remembering to take my meds.She is on metoprolol daily. Denies alcohol use or high caffeine use. Questionable snoring history.   Today, she denies symptoms of palpitations, chest pain, shortness of breath, orthopnea, PND, lower extremity edema, dizziness,  presyncope, syncope, or neurologic sequela. The patient is tolerating medications without difficulties and is otherwise without complaint today.   Past Medical History  Diagnosis Date  . Diabetes mellitus   . Hypertension   . Hyperlipidemia   . Osteoarthritis   . ANEMIA, IRON DEFICIENCY, UNSPEC. 02/05/2007    Qualifier: Diagnosis of  By: Para March MD, Cheree Ditto    . Ventral hernia   . Enteritis   . DJD (degenerative joint disease) of lumbar spine   . Diverticulosis 05/17/12  . Renal cyst, left 05/17/12  . Gastric ulcer 04/2000  . Colon polyps    Past Surgical History  Procedure Laterality Date  . Tubal ligation    . Uterine fibroid surgery    . Operative hysteroscopy    . Abdominal hysterectomy    . Left heart catheterization with coronary angiogram N/A 02/16/2015    Procedure: LEFT HEART CATHETERIZATION WITH CORONARY ANGIOGRAM;  Surgeon: Kathleene Hazel, MD;  Location: [facility] CATH LAB;  Service: Cardiovascular;  Laterality: N/A;    Current Outpatient Prescriptions  Medication Sig Dispense Refill  . amLODipine (NORVASC) 5 MG tablet TAKE 1 TABLET EVERY DAY 90 tablet 3  . diazepam (VALIUM) 5 MG tablet Take 1 tablet (5 mg total) by mouth every 8 (eight) hours as needed for anxiety. 5 tablet 0  . hydrochlorothiazide (HYDRODIURIL) 25 MG tablet TAKE 1 TABLET BY MOUTH EVERY DAY 30 tablet 3  . HYDROcodone-acetaminophen (NORCO/VICODIN) 5-325 MG  per tablet Take by mouth every 6 (six) hours as needed for moderate pain.   0  . metFORMIN (GLUCOPHAGE-XR) 500 MG 24 hr tablet TAKE 2 TABLETS BY MOUTH 2 TIMES A DAY 180 tablet 3  . metoprolol succinate (TOPROL XL) 25 MG 24 hr tablet Take 1 tablet (25 mg total) by mouth daily. 30 tablet 6  . pravastatin (PRAVACHOL) 80 MG tablet TAKE 1 TABLET EVERY DAY 90 tablet 3  . traMADol (ULTRAM) 50 MG tablet AKE 1 TABLET BY MOUTH EVERY 6 HOURS AS NEEDED (Patient taking differently: TAKE 1 TABLET BY MOUTH EVERY 6 HOURS AS NEEDED FOR PAIN) 90 tablet 3  . glucose blood  ([employer] BLOOD GLUCOSE TEST STRIPS) test strip Use as instructed 100 each 12  . rivaroxaban (XARELTO) 20 MG TABS tablet Take 1 tablet (20 mg total) by mouth daily with supper. 30 tablet 6   No current facility-administered medications for this visit.    Allergies  Allergen Reactions  . Flexeril [Cyclobenzaprine Hcl]     unknown  . Lipitor [Atorvastatin Calcium]     Myalgias  . Lisinopril Swelling  . Metaxalone Itching and Other (See Comments)    whelps    History   Social History  . Marital Status: Divorced    Spouse Name: N/A  . Number of Children: 4  . Years of Education: N/A   Occupational History  . COOK    Social History Main Topics  . Smoking status: Former Smoker    Types: Cigarettes    Quit date: 12/09/1978  . Smokeless tobacco: Never Used  . Alcohol Use: No  . Drug Use: No  . Sexual Activity: Not Currently   Other Topics Concern  . Not on file   Social History Narrative    Family History  Problem Relation Age of Onset  . Diabetes Mother   . Kidney disease Mother   . Lung cancer Father   . Stomach cancer Sister   . Colon cancer Neg Hx   . Clotting disorder Other     neice    ROS- All systems are reviewed and negative except as per the HPI above  Physical Exam: Filed Vitals:   02/23/15 0847  BP: 138/90  Pulse: 90  Height:  (1.6 m)  Weight: 245 lb 12.8 oz (111.494 kg)    GEN- The patient is well appearing, alert and oriented x 3 today.  Poor historian Head- normocephalic, atraumatic Eyes-  Sclera clear, conjunctiva pink Ears- hearing intact Oropharynx- clear Neck- supple, no JVP Lymph- no cervical lymphadenopathy Lungs- Clear to ausculation bilaterally, normal work of breathing Heart- Regular rate and rhythm, no murmurs, rubs or gallops, PMI not laterally displaced GI- soft, NT, ND, + BS Extremities- no clubbing, cyanosis, or edema MS- no significant deformity or atrophy Skin- no rash or lesion Psych- euthymic mood, full  affect Neuro- strength and sensation are intact  EKG-NSR with first degree av block at 90 bpm.  Extensive EPIC record review.  Echo- Left ventricle: The cavity size was normal. Wall thickness was increased in a pattern of mild LVH. Systolic function was normal. The estimated ejection fraction was in the range of 55% to 60%. Wall motion was normal; there were no regional wall motion abnormalities. Doppler parameters are consistent with abnormal left ventricular relaxation (grade 1 diastolic dysfunction). - Aortic valve: There was trivial regurgitation. - Left atrium: The atrium was moderately dilated. - Atrial septum: There was an atrial septal aneurysm. - Pericardium, extracardiac: A trivial pericardial  effusion was identified.  LHC 02/16/15  Mild non-obstructive CAD 2. Normal LV function Recommendations: Medical management of mild CAD. Of note, pt in atrial fibrillation during procedure. Further management of atrial fibrillation at f/u visit with Dr. Mayford Knifeurner.   Assessment and Plan:  1. PAF with EPIC record review showing first onset  in 2012 with documented EKG. Currently asymptomatic.  Wearing 30 day monitor. Will review when finished for af burden. For now continue metoprolol 25 mg a day.  2. Chads2vasc score of 4 Was on coumadin short term first of year, had some mild hematuria with tx in ER for Pylo. Coumadin was d/ced at some point and pt does not want to return to its use. Discussed in length stroke risk and NOAC, risk vrs benefit. She now had medicare, hopefully can afford, she wants Xarelto due to one time a day dosing.  3. F/u in 3 weeks and with Dr. Mayford Knifeurner in May as scheduled.

## 2015-02-23 NOTE — Patient Instructions (Signed)
Your physician recommends that you schedule a follow-up appointment in: 3 weeks with Rudi Cocoonna Carroll, NP  Your physician has recommended you make the following change in your medication:  1)xarelto 20mg  daily  Try to avoid NSAIDS (advil, motrin, ibuprofen, aleve) as this can increase your bleeding risk with xarelto

## 2015-03-01 ENCOUNTER — Telehealth: Payer: Self-pay | Admitting: Family Medicine

## 2015-03-01 NOTE — Telephone Encounter (Signed)
Spoke with [employer] and gave them Armanda Magicraci Turner, MD NPI number. Originally placed under NP at that office and the referral had to be a MD

## 2015-03-01 NOTE — Telephone Encounter (Signed)
Wants Huntley DecSara to call back regarding referral for pt. / thanks Dorothey BasemanSadie Reynolds, ASA

## 2015-03-07 ENCOUNTER — Telehealth: Payer: Self-pay | Admitting: Cardiology

## 2015-03-07 NOTE — Telephone Encounter (Signed)
Please let patient now that heart monitor showed frequent episodes of PAC's, nonsustained atrial tachycardia and PAF with HR ranging from 50-132 bpm.  She was placed on Xarelto and is on Toprol but significant breakthrough of PAF.  Please increase Toprol to 50mg  daily and have patient followup with PA in 2 weeks to see how well PAF is suppressed.  If still having breakthrough may need to consider Amio (patient has mild CAD so would avoid flecainide)

## 2015-03-08 NOTE — Telephone Encounter (Signed)
Left message to call back  

## 2015-03-09 MED ORDER — METOPROLOL SUCCINATE ER 50 MG PO TB24: 50.0000 mg | ORAL_TABLET | Freq: Every day | ORAL | Status: DC

## 2015-03-09 NOTE — Addendum Note (Signed)
Addended by: Gunnar FusiKEMP, Mozell Haber A on: 03/09/2015 09:02 AM   Modules accepted: Orders

## 2015-03-09 NOTE — Telephone Encounter (Signed)
Informed patient of results and verbal understanding expressed.  Instructed patient to INCREASE TOPROL to 50 mg daily. OV scheduled for 4/22.  Patient agrees with treatment plan.

## 2015-03-11 ENCOUNTER — Emergency Department (HOSPITAL_COMMUNITY)
Admission: EM | Admit: 2015-03-11 | Discharge: 2015-03-11 | Disposition: A | Discharge status: Home / Self Care | Payer: Commercial Managed Care - HMO | Attending: Emergency Medicine | Admitting: Emergency Medicine

## 2015-03-11 ENCOUNTER — Encounter (HOSPITAL_COMMUNITY): Payer: Self-pay | Admitting: *Deleted

## 2015-03-11 DIAGNOSIS — Z79899 Other long term (current) drug therapy: Secondary | ICD-10-CM | POA: Insufficient documentation

## 2015-03-11 DIAGNOSIS — I10 Essential (primary) hypertension: Secondary | ICD-10-CM | POA: Insufficient documentation

## 2015-03-11 DIAGNOSIS — Z8719 Personal history of other diseases of the digestive system: Secondary | ICD-10-CM | POA: Diagnosis not present

## 2015-03-11 DIAGNOSIS — E119 Type 2 diabetes mellitus without complications: Secondary | ICD-10-CM | POA: Diagnosis not present

## 2015-03-11 DIAGNOSIS — Z86010 Personal history of colonic polyps: Secondary | ICD-10-CM | POA: Diagnosis not present

## 2015-03-11 DIAGNOSIS — M545 Low back pain: Secondary | ICD-10-CM | POA: Insufficient documentation

## 2015-03-11 DIAGNOSIS — M5459 Other low back pain: Secondary | ICD-10-CM

## 2015-03-11 DIAGNOSIS — Z87891 Personal history of nicotine dependence: Secondary | ICD-10-CM | POA: Insufficient documentation

## 2015-03-11 DIAGNOSIS — M549 Dorsalgia, unspecified: Secondary | ICD-10-CM | POA: Diagnosis present

## 2015-03-11 DIAGNOSIS — Z9889 Other specified postprocedural states: Secondary | ICD-10-CM | POA: Diagnosis not present

## 2015-03-11 DIAGNOSIS — E785 Hyperlipidemia, unspecified: Secondary | ICD-10-CM | POA: Diagnosis not present

## 2015-03-11 DIAGNOSIS — Z862 Personal history of diseases of the blood and blood-forming organs and certain disorders involving the immune mechanism: Secondary | ICD-10-CM | POA: Diagnosis not present

## 2015-03-11 DIAGNOSIS — Z7901 Long term (current) use of anticoagulants: Secondary | ICD-10-CM | POA: Insufficient documentation

## 2015-03-11 DIAGNOSIS — Q6100 Congenital renal cyst, unspecified: Secondary | ICD-10-CM | POA: Diagnosis not present

## 2015-03-11 HISTORY — DX: Cardiac arrhythmia, unspecified: I49.9

## 2015-03-11 LAB — URINALYSIS, ROUTINE W REFLEX MICROSCOPIC
GLUCOSE, UA: NEGATIVE mg/dL
HGB URINE DIPSTICK: NEGATIVE
Ketones, ur: NEGATIVE mg/dL
LEUKOCYTES UA: NEGATIVE
Nitrite: NEGATIVE
PH: 5.5 (ref 5.0–8.0)
PROTEIN: NEGATIVE mg/dL
SPECIFIC GRAVITY, URINE: 1.025 (ref 1.005–1.030)
UROBILINOGEN UA: 1 mg/dL (ref 0.0–1.0)

## 2015-03-11 MED ORDER — TRAMADOL HCL 50 MG PO TABS: 50.0000 mg | ORAL_TABLET | Freq: Four times a day (QID) | ORAL | Status: DC | PRN

## 2015-03-11 MED ORDER — HYDROCODONE-ACETAMINOPHEN 5-325 MG PO TABS
1.0000 | ORAL_TABLET | Freq: Once | ORAL | Status: AC
  Administered 2015-03-11: 1 via ORAL
  Filled 2015-03-11: qty 1

## 2015-03-11 NOTE — ED Notes (Addendum)
Pt reports intermittent left side back pain x 2 weeks, denies any injury. Pt denies any urinary symptoms.

## 2015-03-11 NOTE — Discharge Instructions (Signed)
Back Exercises °Back exercises help treat and prevent back injuries. The goal of back exercises is to increase the strength of your abdominal and back muscles and the flexibility of your back. These exercises should be started when you no longer have back pain. Back exercises include: °· Pelvic Tilt. Lie on your back with your knees bent. Tilt your pelvis until the lower part of your back is against the floor. Hold this position 5 to 10 sec and repeat 5 to 10 times. °· Knee to Chest. Pull first 1 knee up against your chest and hold for 20 to 30 seconds, repeat this with the other knee, and then both knees. This may be done with the other leg straight or bent, whichever feels better. °· Sit-Ups or Curl-Ups. Bend your knees 90 degrees. Start with tilting your pelvis, and do a partial, slow sit-up, lifting your trunk only 30 to 45 degrees off the floor. Take at least 2 to 3 seconds for each sit-up. Do not do sit-ups with your knees out straight. If partial sit-ups are difficult, simply do the above but with only tightening your abdominal muscles and holding it as directed. °· Hip-Lift. Lie on your back with your knees flexed 90 degrees. Push down with your feet and shoulders as you raise your hips a couple inches off the floor; hold for 10 seconds, repeat 5 to 10 times. °· Back arches. Lie on your stomach, propping yourself up on bent elbows. Slowly press on your hands, causing an arch in your low back. Repeat 3 to 5 times. Any initial stiffness and discomfort should lessen with repetition over time. °· Shoulder-Lifts. Lie face down with arms beside your body. Keep hips and torso pressed to floor as you slowly lift your head and shoulders off the floor. °Do not overdo your exercises, especially in the beginning. Exercises may cause you some mild back discomfort which lasts for a few minutes; however, if the pain is more severe, or lasts for more than 15 minutes, do not continue exercises until you see your caregiver.  Improvement with exercise therapy for back problems is slow.  °See your caregivers for assistance with developing a proper back exercise program. °Document Released: 01/02/2005 Document Revised: 02/17/2012 Document Reviewed: 09/26/2011 °ExitCare® Patient Information ©2015 [employer]. This information is not intended to replace advice given to you by your health care provider. Make sure you discuss any questions you have with your health care provider. ° ° ° °Emergency Department Resource Guide °1) Find a Doctor and Pay Out of Pocket °Although you won't have to find out who is covered by your insurance plan, it is a good idea to ask around and get recommendations. You will then need to call the office and see if the doctor you have chosen will accept you as a new patient and what types of options they offer for patients who are self-pay. Some doctors offer discounts or will set up payment plans for their patients who do not have insurance, but you will need to ask so you aren't surprised when you get to your appointment. ° °2) Contact Your Local Health Department °Not all health departments have doctors that can see patients for sick visits, but many do, so it is worth a call to see if yours does. If you don't know where your local health department is, you can check in your phone book. The CDC also has a tool to help you locate your state's health department, and many state websites also   have listings of all of their local health departments. ° °3) Find a Walk-in Clinic °If your illness is not likely to be very severe or complicated, you may want to try a walk in clinic. These are popping up all over the country in pharmacies, drugstores, and shopping centers. They're usually staffed by nurse practitioners or physician assistants that have been trained to treat common illnesses and complaints. They're usually fairly quick and inexpensive. However, if you have serious medical issues or chronic medical problems,  these are probably not your best option. ° °No Primary Care Doctor: °- Call Health Connect at  832-8000 - they can help you locate a primary care doctor that  accepts your insurance, provides certain services, etc. °- Physician Referral Service- 1-800-533-3463 ° °Chronic Pain Problems: °Organization         Address  Phone   Notes  °[facility]  (336) 297-2271 Patients need to be referred by their primary care doctor.  ° °Medication Assistance: °Organization         Address  Phone   Notes  °[place] [facility] [address] °[place], [place] [address] (336) 641-8030 --Must be a resident of [place] °-- Must have NO insurance coverage whatsoever (no Medicaid/ Medicare, etc.) °-- The pt. MUST have a primary care doctor that directs their care regularly and follows them in the community °  °[facility]  (866) 331-1348   °[employer]  (888) 892-1162   ° °Agencies that provide inexpensive medical care: °Organization         Address  Phone   Notes  °[facility]  (336) 832-8035   °[facility]    (336) 832-7272   °[facility] [address] °[place], [place] [address] (336) 832-4777   °[facility] [address], °[place] (336) 271-4999   °[facility]    (336) 373-0678   °[facility]    (336) 272-1050   °[facility] ° [address], [place] Phone:  (336) 832-4444, Fax:  (336) 832-4440 Hours of Operation:  9 am - 6 pm, M-F.  Also accepts Medicaid/Medicare and self-pay.  °[facility] ° [address], [place] Phone: (336) 832-3150, Fax: (336) [phone]. Hours of Operation:  8:30 am - 5:30 pm, M-F.  Also accepts Medicaid and self-pay.  °[facility] [place] [address], [place] Phone: (336) 878-6027   °[facility] [address], [place], [place] (336)723-1848, Ext. 123 Mondays &  Thursdays: 7-9 AM.  First 15 patients are seen on a first come, first serve basis. °  ° °Medicaid-accepting [place] Providers: ° °Organization         Address  Phone   Notes  °[facility] [address], [place] (336) 641-2100 Also accepts self-pay patients.  °[facility] [address], [place] ° (336) 856-9996   °[facility] [address], [place] (336) 288-8857   °[facility] [address] [place] [address], [place] (336) 299-7000   °Veita Bland [address], [place]  ° (336) 373-1557 Only accepts [place] Access Medicaid patients after they have their name applied to their card.  ° °Self-Pay (no insurance) in [place]: ° °Organization         Address  Phone     Notes  °Sickle Cell Patients, [facility] [address], [place] (336) 832-1970   °[facility] [address], [place] (336) 832-4400   °[facility] ° [address], [place] (336) 992-4800   °[facility]/Dr. Osei-Bonsu ° [address] [place] [address], [place] or [address], [place] (336) 841-8500 Phone number for both [place] and [place] locations is the same.  °[facility] [address], [place] (336) 299-0000   °[facility] [address] [place] [address], [place] or [address] (336) 852-7530 °(336) 878-2260   °[facility] [address], [place] (336) 350-1642, phone; (336) 294-5005, fax Sees patients 1st and 3rd Saturday of every month.  Must not qualify for public or private insurance (i.e. Medicaid, Medicare, [place] Health Choice, Veterans' Benefits) • Household income should be no more than 200% of the poverty level •The clinic cannot treat you if you are pregnant or think you are pregnant • Sexually transmitted diseases are not treated at the  clinic.  ° ° °Dental Care: °Organization         Address  Phone  Notes  °[facility] [address], [place] (336) 641-6152 Accepts children up to age 21 who are enrolled in Medicaid or [place] Health Choice; pregnant women with a Medicaid card; and children who have applied for Medicaid or [place] Health Choice, but were declined, whose parents can pay a reduced fee at time of service.  °[facility] [place]  [address], [place] (336) 641-7733 Accepts children up to age 21 who are enrolled in Medicaid or [place] Health Choice; pregnant women with a Medicaid card; and children who have applied for Medicaid or [place] Health Choice, but were declined, whose parents can pay a reduced fee at time of service.  °[facility] ° [address], [place] (336) 641-4533 Patients are seen by appointment only. Walk-ins are not accepted. [facility] will see patients 18 years of age and older. °Monday - Tuesday (8am-5pm) °Most Wednesdays (8:30-5pm) °$30 per visit, cash only  °[facility] ° [address], [place] (336) 641-4533 Patients are seen by appointment only. Walk-ins are not accepted. [facility] will see patients 18 years of age and older. °One Wednesday Evening (Monthly: Volunteer Based).  $30 per visit, cash only  °[facility]  (919) 537-3737 for adults; Children under age 4, call Graduate Pediatric Dentistry at (919) [phone]. Children aged 4-14, please call (919) [phone] to request a pediatric application. ° Dental services are provided in all areas of dental care including fillings, crowns and bridges, complete and partial dentures, implants, gum treatment, root canals, and extractions. Preventive care is also provided. Treatment is provided to both adults and children. °Patients are selected via a lottery and there is often a  waiting list. °  °[facility] [address], °[place] ° (336) 763-8833 [email or web address] °  °[facility] [address], [place], [place] (336)723-1848, Ext. 123 Second and Fourth Thursday of each month, opens at 6:30 AM; Clinic ends at 9 AM.  Patients are seen on a first-come first-served basis, and a limited number are seen during each clinic.  ° °[facility] ° [address], [place], [place] (336) 723-7904     Eligibility Requirements °You must have lived in [place], [place], or [place] counties for at least the last three months. °  You cannot be eligible for state or federal sponsored healthcare insurance, including Veterans Administration, Medicaid, or Medicare. °  You generally cannot be eligible for healthcare insurance through your employer.  °  How to apply: °Eligibility screenings are held every Tuesday and Wednesday afternoon from 1:00 pm until 4:00 pm. You do not need an appointment for the interview!  °[facility] [address], [place], [place] 336-631-2330   °[facility] [facility]  336-342-8273   °[facility]  336-703-3100   °[facility]  336-570-6415   ° °Behavioral Health Resources in the Community: °Intensive Outpatient Programs °Organization         Address  Phone  Notes  °[facility] [address], [place], [place] 336-878-6098   °[facility] [address], [place], [place] 336-832-9800   °[facility] [address], [place], [place] ° 336-882-2125   °[facility] [address],  °[place], [place] 1-800-853-5163 or 336-641-4981   °Substance Abuse Resources °Organization         Address  Phone  Notes  °[facility]  336-882-2125   °[facility]  336-784-9470   °[facility]  336-285-9073   °[facility]  336-845-3988   °Residential & Outpatient Substance Abuse  Program  1-800-659-3381   °Psychological Services °Organization         Address  Phone  Notes  °[facility]  336- 832-9600   °[facility]  336- 378-7881   °[facility] [address], [place] 1-800-853-5163 or 336-641-4981   ° °Mobile Crisis Teams °Organization         Address  Phone  Notes  °[facility], Mobile Crisis Care Unit  1-877-626-1772   °Assertive °[facility] ° [address] [place], [place] 336-834-9664   °Sharon DeEsch [address] °[place] 336-554-5454   ° °Self-Help/Support Groups °Organization         Address  Phone             Notes  °[facility] - variety of support groups  336- 373-1402 Call for more information  °Narcotics Anonymous (NA), [facility] [address], °[place]  2 meetings at this location  ° °Residential Treatment Programs °Organization         Address  Phone  Notes  °[facility] [address],    °[place]  1-866-801-8205   °[facility] ° [address], [place], [place] 704-293-8524   °[facility] [address], [place] 336-845-3988 Admissions: 8am-3pm M-F  °[facility] [address]    °[place], [place] 336-841-1104   °[facility] [address], [place], [place] 336-379-7146   °[facility] [address]  °[place], [place] 336-285-9073   °[facility] Intensive Outpatient [address], [place], [place] 336-852-3033   °[facility] [address]  °[place], [place] 1-877-615-2722 or 336-784-9470   °[facility] [address] [place], [place] 336-227-7417 Accepts Medicaid  °[facility] [address]  °[place]  1-800-659-3381 Substance Abuse/Addiction Treatment  ° °[place] Behavioral Health Resources °Organization            Address  Phone  Notes  °[facility]  (888) 581-9988   °Julie Brannon, PhD [address] [place], [place]   (336) 349-5553 or (336) 951-0000   °[facility]   [address] °[place], [place] (336) 349-4454   °[facility] [address], [place], [place] (336) 342-8316 Insurance/Medicaid/sponsorship through [facility]  °[facility] [address]                                    [place], [place] (336) 342-8316 Therapy/tele-psych/case  °[facility] [address]  ° [place], [place] (336) 349-2233    °Dr. Arfeen  (336) 349-4544   °[facility] [place]  [employer] [place] [facility] 1) [address], [place] °2) [address], [place] °3)  [address], [place] (336) 349-3220 °(336) 342-7768 ° °(336) 342-8140   °[place] Child Abuse Hotline (336) 342-1394 or (336) 342-3537 (After Hours)    ° ° °

## 2015-03-11 NOTE — ED Provider Notes (Signed)
Medical screening examination/treatment/procedure(s) were conducted as a shared visit with non-physician practitioner(s) and myself.  I personally evaluated the patient during the encounter.   EKG Interpretation None      64 yo female with left mid back pain.  She thinks it may be secondary to moving a lot of furniture around her house or possibly her mattress. No hematuria. No bowel or bladder dysfunction or incontinence. No perineal paresthesias. No lower extremity weakness or numbness. On exam, well appearing, nontoxic, not distressed, normal respiratory effort, normal perfusion, tenderness to palpation of left thoracic and upper lumbar back without swelling, erythema, or rash, normal lower extremity strength and sensation. Likely muscle skeletal back pain. Plan symptomatic treatment and follow-up.  Clinical Impression: 1. Acute mechanical low back pain, duration < 6 weeks        Blake DivineJohn Kenyetta Fife, MD 03/11/15 1032

## 2015-03-11 NOTE — ED Provider Notes (Signed)
CSN: [id number]     Arrival date & time 03/11/15  7829 History   First MD Initiated Contact with Patient 03/11/15 [phone]     Chief Complaint  Patient presents with  . Back Pain     (Consider location/radiation/quality/duration/timing/severity/associated sxs/prior Treatment) Patient is a 64 y.o. female presenting with back pain. The history is provided by the patient. No language interpreter was used.  Back Pain Associated symptoms: no pelvic pain   Meredith [name] is a 64 y.o black female with a history of DM and htn who presents with 3 weeks of new onset right sided back pain that is worse with movement from side to side. Pain is 8/10 now. She has not taken anything for pain. She states she was moving 3 large pieces of furniture three weeks ago before the onset of pain.  She denies any fever, chills, chest pain, abdominal pain, or urinary symptoms.  She denies a history of kidney stones. She denies any history of cancer or IV drug use.   Past Medical History  Diagnosis Date  . Diabetes mellitus   . Hypertension   . Hyperlipidemia   . Osteoarthritis   . ANEMIA, IRON DEFICIENCY, UNSPEC. 02/05/2007    Qualifier: Diagnosis of  By: Para March MD, Cheree Ditto    . Ventral hernia   . Enteritis   . DJD (degenerative joint disease) of lumbar spine   . Diverticulosis 05/17/12  . Renal cyst, left 05/17/12  . Gastric ulcer 04/2000  . Colon polyps   . Irregular heart beat    Past Surgical History  Procedure Laterality Date  . Tubal ligation    . Uterine fibroid surgery    . Operative hysteroscopy    . Abdominal hysterectomy    . Left heart catheterization with coronary angiogram N/A 02/16/2015    Procedure: LEFT HEART CATHETERIZATION WITH CORONARY ANGIOGRAM;  Surgeon: Kathleene Hazel, MD;  Location: [facility] CATH LAB;  Service: Cardiovascular;  Laterality: N/A;   Family History  Problem Relation Age of Onset  . Diabetes Mother   . Kidney disease Mother   . Lung cancer Father   . Stomach cancer Sister    . Colon cancer Neg Hx   . Clotting disorder Other     neice   History  Substance Use Topics  . Smoking status: Former Smoker    Types: Cigarettes    Quit date: 12/09/1978  . Smokeless tobacco: Never Used  . Alcohol Use: No   OB History    No data available     Review of Systems  Gastrointestinal: Negative for nausea, vomiting, blood in stool and abdominal distention.  Genitourinary: Negative for urgency, frequency, hematuria, difficulty urinating and pelvic pain.  Musculoskeletal: Positive for back pain. Negative for neck pain.  All other systems reviewed and are negative.     Allergies  Flexeril; Lipitor; Lisinopril; and Metaxalone  Home Medications   Prior to Admission medications   Medication Sig Start Date End Date Taking? Authorizing Provider  amLODipine (NORVASC) 5 MG tablet TAKE 1 TABLET EVERY DAY 05/03/14  Yes [name] R Hess, DO  glucose blood ([facility] BLOOD GLUCOSE TEST STRIPS) test strip Use as instructed 11/24/13  Yes [name] R Hess, DO  hydrochlorothiazide (HYDRODIURIL) 25 MG tablet TAKE 1 TABLET BY MOUTH EVERY DAY 02/01/15  Yes [name] R Hess, DO  metFORMIN (GLUCOPHAGE-XR) 500 MG 24 hr tablet TAKE 2 TABLETS BY MOUTH 2 TIMES A DAY 11/25/14  Yes [name] R Hess, DO  metoprolol succinate (TOPROL-XL) 50 MG 24  hr tablet Take 1 tablet (50 mg total) by mouth daily. 03/09/15  Yes Quintella Reichertraci R Turner, MD  pravastatin (PRAVACHOL) 80 MG tablet TAKE 1 TABLET EVERY DAY 09/27/14  Yes [name] R Hess, DO  rivaroxaban (XARELTO) 20 MG TABS tablet Take 1 tablet (20 mg total) by mouth daily with supper. 02/23/15  Yes Newman Niponna C Carroll, NP  diazepam (VALIUM) 5 MG tablet Take 1 tablet (5 mg total) by mouth every 8 (eight) hours as needed for anxiety. 01/20/15   Roxy Horsemanobert Browning, PA-C  traMADol (ULTRAM) 50 MG tablet Take 1 tablet (50 mg total) by mouth every 6 (six) hours as needed. 03/11/15   Doxie Augenstein Patel-Mills, PA-C   BP 126/81 mmHg  Pulse 55  Temp(Src) 98 F (36.7 C) (Oral)  Resp 12  SpO2 99% Physical  Exam  Constitutional: She is oriented to person, place, and time. She appears well-developed and well-nourished.  HENT:  Head: Normocephalic and atraumatic.  Eyes: Conjunctivae and EOM are normal.  Neck: Normal range of motion. Neck supple.  Cardiovascular: Normal rate, regular rhythm and normal heart sounds.   Pulmonary/Chest: Effort normal and breath sounds normal. She has no wheezes.  Abdominal: Soft. There is no tenderness.  Musculoskeletal: Normal range of motion.  No midline tenderness.  No reproducible back pain.   Neurological: She is alert and oriented to person, place, and time.  She has good sensation in leg and feet.    Skin: Skin is warm and dry.  No rash.  Nursing note and vitals reviewed.   ED Course  Procedures (including critical care time) Labs Review Labs Reviewed  URINALYSIS, ROUTINE W REFLEX MICROSCOPIC - Abnormal; Notable for the following:    Color, Urine AMBER (*)    Bilirubin Urine SMALL (*)    All other components within normal limits    Imaging Review No results found.   EKG Interpretation None      MDM   Final diagnoses:  Acute mechanical low back pain, duration < 6 weeks   Patient presents for new onset back pain. No numbness or tingling on exam. Her exam is normal.  No midline tenderness. She denies any injury but states she was moving large pieces of furniture before the onset of back pain.  She denies a history of cancer or IV drug use.  No previous back surgeries or kidney stones.  Pain is worse with movement.  Her urinalysis is normal and I do not suspect UTI or pyelonephritis.  I do not suspect epidural abscess or fracture. Her vitals are stable and she is afebrile.   I discussed this patient with Dr. Loretha StaplerWofford who agrees with the plan to send her home.  I will give her NSAIDs and pain medication.  She says that she does not want muscle relaxers because they make her swell up.  She agrees to f/u with her pcp.     Catha GosselinHanna Patel-Mills,  PA-C 03/11/15 1552  Blake DivineJohn Wofford, MD 03/12/15 [phone]

## 2015-03-15 ENCOUNTER — Telehealth: Payer: Self-pay

## 2015-03-15 NOTE — Telephone Encounter (Signed)
No needs to go to PCP

## 2015-03-30 NOTE — Progress Notes (Signed)
Cardiology Office Note   Date:  03/31/2015   ID:  Meredith [name], DOB 1951-05-12, MRN [id number]  PCP:  Meredith CrankerHess, [name], DO  Cardiologist:  Dr. Armanda Magicraci [name]     Chief Complaint  Patient presents with  . Atrial Fibrillation     History of Present Illness: Meredith [name] is a 64 y.o. female with a hx of DM, HTN, PAF first noted in 01/2015.  She was placed on Coumadin.  Event monitor then demonstrated ATach and PACs and Coumadin was DCd.  Myoview was done for chest pain and this was abnormal.  LHC demonstrated minimal non-obstructive CAD.  She had AFib during her procedure and was set up for FU in the AFib Clinic.  She saw Meredith Cocoonna Carroll, NP.  Patient was on an event monitor and this was continued.  Patient did not want to resume Coumadin.  CHADS2-VASc=4.  She was placed on Xarelto.   Dr. Mayford Knifeurner recently reviewed her monitor and it demonstrated frequent PACs, ATach, PAF.  HR was up to 130s at times.  Her beta-blocker was increased and she was brought back for FU.  If she continues to have breakthrough PAF, she may need Amiodarone.   She is doing well.  She denies palpitations.  The patient denies chest pain, shortness of breath, syncope, orthopnea, PND or significant pedal edema.    Studies/Reports Reviewed Today:  LHC 02/16/15 Left main: No obstructive disease.   Left Anterior Descending Artery:  No obstructive disease.   Circumflex Artery:  20% proximal stenosis. OM1 has 20% mid stenosis.   Right Coronary Artery:  20% mid stenosis.  Left Ventricular Angiogram: LVEF=55-60%  Myoview 02/10/15 Intermediate risk stress nuclear study with a moderate area of inferolateral ischemia.  LV Ejection Fraction: Study not gated.  LV Wall Motion:  Study not gated   Echo 11/29/14 - Mild LVH. EF 55% to 60%.  Wall motion was normal. Grade 1 diastolic dysfunction. - Aortic valve: There was trivial regurgitation. - Left atrium: The atrium was moderately dilated. - Atrial septum: There was an atrial septal  aneurysm. - Pericardium, extracardiac: A trivial pericardial effusion was identified. Impressions:   Normal LV function; mild LVH; grade 1 diastolic dysfunction; moderate LAE; atrial septal aneurysm.   Past Medical History  Diagnosis Date  . Diabetes mellitus   . Hypertension   . Hyperlipidemia   . Osteoarthritis   . ANEMIA, IRON DEFICIENCY, UNSPEC. 02/05/2007    Qualifier: Diagnosis of  By: Para Marchuncan MD, Cheree DittoGraham    . Ventral hernia   . Enteritis   . DJD (degenerative joint disease) of lumbar spine   . Diverticulosis 05/17/12  . Renal cyst, left 05/17/12  . Gastric ulcer 04/2000  . Colon polyps   . Irregular heart beat     Past Surgical History  Procedure Laterality Date  . Tubal ligation    . Uterine fibroid surgery    . Operative hysteroscopy    . Abdominal hysterectomy    . Left heart catheterization with coronary angiogram N/A 02/16/2015    Procedure: LEFT HEART CATHETERIZATION WITH CORONARY ANGIOGRAM;  Surgeon: Kathleene Hazelhristopher D McAlhany, MD;  [facility];  Service: Cardiovascular;  Laterality: N/A;     Current Outpatient Prescriptions  Medication Sig Dispense Refill  . amLODipine (NORVASC) 5 MG tablet TAKE 1 TABLET EVERY DAY 90 tablet 3  . diazepam (VALIUM) 5 MG tablet Take 1 tablet (5 mg total) by mouth every 8 (eight) hours as needed for anxiety. 5 tablet 0  .  glucose blood ([facility]S BLOOD GLUCOSE TEST STRIPS) test strip Use as instructed 100 each 12  . hydrochlorothiazide (HYDRODIURIL) 25 MG tablet TAKE 1 TABLET BY MOUTH EVERY DAY 30 tablet 3  . metFORMIN (GLUCOPHAGE-XR) 500 MG 24 hr tablet TAKE 2 TABLETS BY MOUTH 2 TIMES A DAY 180 tablet 3  . metoprolol succinate (TOPROL-XL) 50 MG 24 hr tablet Take 1 tablet (50 mg total) by mouth daily. 30 tablet 6  . pravastatin (PRAVACHOL) 80 MG tablet TAKE 1 TABLET EVERY DAY 90 tablet 3  . rivaroxaban (XARELTO) 20 MG TABS tablet Take 1 tablet (20 mg total) by mouth daily with supper. 30 tablet 6  . traMADol (ULTRAM) 50 MG tablet Take 1  tablet (50 mg total) by mouth every 6 (six) hours as needed. 12 tablet 0   No current facility-administered medications for this visit.    Allergies:   Flexeril; Lipitor; Lisinopril; and Metaxalone    Social History:  The patient  reports that she quit smoking about 36 years ago. Her smoking use included Cigarettes. She has never used smokeless tobacco. She reports that she does not drink alcohol or use illicit drugs.   Family History:  The patient's family history includes Clotting disorder in her other; Diabetes in her mother; Kidney disease in her mother; Lung cancer in her father; Stomach cancer in her sister. There is no history of Colon cancer.    ROS:   Please see the history of present illness.   ROS    PHYSICAL EXAM: VS:  BP 156/80 mmHg  Pulse 77  Ht  (1.6 m)  Wt 243 lb (110.224 kg)  BMI 43.06 kg/m2    Wt Readings from Last 3 Encounters:  03/31/15 243 lb (110.224 kg)  02/23/15 245 lb 12.8 oz (111.494 kg)  02/17/15 242 lb 12.8 oz (110.133 kg)     GEN: Well nourished, well developed, in no acute distress HEENT: normal Neck: no JVD, no carotid bruits, no masses Cardiac:  Normal S1/S2, RRR; no murmur ,  no rubs or gallops, no edema  Respiratory:  clear to auscultation bilaterally, no wheezing, rhonchi or rales. GI: soft, nontender, nondistended, + BS MS: no deformity or atrophy Skin: warm and dry  Neuro:  CNs II-XII intact, Strength and sensation are intact Psych: Normal affect   EKG:  EKG is ordered today.  It demonstrates:   NSR, HR 86, PACs, septal Q waves, NSSTTW changes, PR 224   Recent Labs: 11/25/2014: Pro B Natriuretic peptide (BNP) 670.00*; TSH 3.429 01/20/2015: ALT 11 02/14/2015: BUN 17; Creatinine 0.72; Hemoglobin 12.6; Platelets 218.0; Potassium 3.7; Sodium 138    Lipid Panel    Component Value Date/Time   CHOL 173 11/25/2014 1426   TRIG 135 11/25/2014 1426   HDL 53 11/25/2014 1426   CHOLHDL 3.3 11/25/2014 1426   VLDL 27 11/25/2014 1426    LDLCALC 93 11/25/2014 1426   LDLDIRECT 130* 05/13/2012 0917      ASSESSMENT AND PLAN:  PAF (paroxysmal atrial fibrillation) She is in NSR today.  There was a question of whether or not she should be on Amiodarone.  EF has remained normal.  She is asymptomatic.  She is on OAC with Xarelto.  She has a lot of PACs and documented ATach.  She would like to avoid taking another medication.  I am not convinced she needs to be on an AAD at this time.  I think we can initially adjust her current rate control Rx.  For now, I have  recommended increasing the dose of her beta-blocker.  -  Increase Toprol to 75 QD  -  BMET, Mg2+ today  -  FU with Dr. Armanda Magic as planned.    -  Consider referral to EP over time (return to AFib Clinic).  Coronary artery disease involving native coronary artery of native heart without angina pectoris Minimal CAD by cath.  No angina.  She is not on ASA as she is on Xarelto.  Continue statin.  Essential hypertension BP elevated.  She has "white coat HTN."  BP at home 130s.  Increase Toprol-XL as noted.   Hyperlipidemia  Continue statin.   Current medicines are reviewed at length with the patient today.  Concerns regarding medicines are as outlined above.  The following changes have been made:    Increase Toprol-XL 75 mg QD.   Labs/ tests ordered today include:   Orders Placed This Encounter  Procedures  . Basic metabolic panel  . Magnesium  . EKG 12-Lead    Disposition:   FU with Dr. Armanda Magic in May as planned.    Signed, Brynda Rim, MHS 03/31/2015 8:31 AM    [facility] [address] [place], [place], [place]  [address] Phone: [phone]; Fax: [phone]

## 2015-03-31 ENCOUNTER — Ambulatory Visit (INDEPENDENT_AMBULATORY_CARE_PROVIDER_SITE_OTHER): Payer: Commercial Managed Care - HMO | Admitting: Physician Assistant

## 2015-03-31 ENCOUNTER — Encounter: Payer: Self-pay | Admitting: Physician Assistant

## 2015-03-31 ENCOUNTER — Other Ambulatory Visit: Payer: Self-pay

## 2015-03-31 VITALS — BP 156/80 | HR 77 | Ht 63.0 in | Wt 243.0 lb

## 2015-03-31 DIAGNOSIS — I480 Paroxysmal atrial fibrillation: Secondary | ICD-10-CM | POA: Diagnosis not present

## 2015-03-31 DIAGNOSIS — R790 Abnormal level of blood mineral: Secondary | ICD-10-CM

## 2015-03-31 DIAGNOSIS — E785 Hyperlipidemia, unspecified: Secondary | ICD-10-CM

## 2015-03-31 DIAGNOSIS — E876 Hypokalemia: Secondary | ICD-10-CM

## 2015-03-31 DIAGNOSIS — I2510 Atherosclerotic heart disease of native coronary artery without angina pectoris: Secondary | ICD-10-CM

## 2015-03-31 DIAGNOSIS — I10 Essential (primary) hypertension: Secondary | ICD-10-CM | POA: Diagnosis not present

## 2015-03-31 LAB — BASIC METABOLIC PANEL
BUN: 17 mg/dL (ref 6–23)
CALCIUM: 9.9 mg/dL (ref 8.4–10.5)
CO2: 31 meq/L (ref 19–32)
Chloride: 102 mEq/L (ref 96–112)
Creatinine, Ser: 0.69 mg/dL (ref 0.40–1.20)
GFR: 109.79 mL/min (ref >60.00)
Glucose, Bld: 139 mg/dL — ABNORMAL HIGH (ref 70–99)
Potassium: 3.5 mEq/L (ref 3.5–5.1)
SODIUM: 138 meq/L (ref 135–145)

## 2015-03-31 LAB — MAGNESIUM: MAGNESIUM: 1.5 mg/dL (ref 1.5–2.5)

## 2015-03-31 MED ORDER — POTASSIUM CHLORIDE CRYS ER 20 MEQ PO TBCR: 20.0000 meq | EXTENDED_RELEASE_TABLET | Freq: Every day | ORAL | Status: DC

## 2015-03-31 MED ORDER — MAGNESIUM OXIDE 400 MG PO TABS: 400.0000 mg | ORAL_TABLET | Freq: Every day | ORAL | Status: DC

## 2015-03-31 MED ORDER — METOPROLOL SUCCINATE ER 50 MG PO TB24: ORAL_TABLET | ORAL | Status: DC

## 2015-03-31 NOTE — Patient Instructions (Addendum)
Medication Instructions:  INCREASE YOUR TOPROL (METOPROLOL) TO 75 MG BY MOUTH DAILY (1 and 1/2 tablet daily)  Labwork: BMET/MAGNESIUM  Testing/Procedures: NONe  Follow-Up: Keep appointment as scheduled with Dr Mayford Knifeurner

## 2015-04-07 ENCOUNTER — Other Ambulatory Visit (INDEPENDENT_AMBULATORY_CARE_PROVIDER_SITE_OTHER): Payer: Commercial Managed Care - HMO | Admitting: *Deleted

## 2015-04-07 ENCOUNTER — Telehealth: Payer: Self-pay | Admitting: *Deleted

## 2015-04-07 DIAGNOSIS — R790 Abnormal level of blood mineral: Secondary | ICD-10-CM

## 2015-04-07 DIAGNOSIS — E876 Hypokalemia: Secondary | ICD-10-CM | POA: Diagnosis not present

## 2015-04-07 DIAGNOSIS — I10 Essential (primary) hypertension: Secondary | ICD-10-CM | POA: Diagnosis not present

## 2015-04-07 DIAGNOSIS — E780 Pure hypercholesterolemia: Secondary | ICD-10-CM | POA: Diagnosis not present

## 2015-04-07 LAB — MAGNESIUM: MAGNESIUM: 1.6 mg/dL (ref 1.5–2.5)

## 2015-04-07 LAB — BASIC METABOLIC PANEL
BUN: 14 mg/dL (ref 6–23)
CO2: 31 mEq/L (ref 19–32)
CREATININE: 0.74 mg/dL (ref 0.40–1.20)
Calcium: 10.4 mg/dL (ref 8.4–10.5)
Chloride: 102 mEq/L (ref 96–112)
GFR: 101.27 mL/min (ref >60.00)
Glucose, Bld: 105 mg/dL — ABNORMAL HIGH (ref 70–99)
Potassium: 4 mEq/L (ref 3.5–5.1)
Sodium: 139 mEq/L (ref 135–145)

## 2015-04-07 NOTE — Addendum Note (Signed)
Addended by: Tonita PhoenixBOWDEN, Arshan Jabs K on: 04/07/2015 08:04 AM   Modules accepted: Orders

## 2015-04-07 NOTE — Telephone Encounter (Signed)
Pt notified of lab results with verbal understanding by phone. 

## 2015-04-20 ENCOUNTER — Other Ambulatory Visit: Payer: Self-pay

## 2015-04-20 ENCOUNTER — Encounter: Payer: Self-pay | Admitting: Cardiology

## 2015-04-20 ENCOUNTER — Ambulatory Visit (INDEPENDENT_AMBULATORY_CARE_PROVIDER_SITE_OTHER): Payer: Commercial Managed Care - HMO | Admitting: Cardiology

## 2015-04-20 VITALS — BP 162/100 | HR 60 | Ht 63.0 in | Wt 242.4 lb

## 2015-04-20 DIAGNOSIS — I10 Essential (primary) hypertension: Secondary | ICD-10-CM | POA: Diagnosis not present

## 2015-04-20 DIAGNOSIS — I471 Supraventricular tachycardia: Secondary | ICD-10-CM | POA: Diagnosis not present

## 2015-04-20 DIAGNOSIS — I480 Paroxysmal atrial fibrillation: Secondary | ICD-10-CM

## 2015-04-20 DIAGNOSIS — R079 Chest pain, unspecified: Secondary | ICD-10-CM

## 2015-04-20 DIAGNOSIS — I4891 Unspecified atrial fibrillation: Secondary | ICD-10-CM

## 2015-04-20 NOTE — Patient Instructions (Signed)

## 2015-04-20 NOTE — Progress Notes (Signed)
Cardiology Office Note   Date:  04/20/2015   ID:  Meredith [name], DOB 1951/12/09, MRN [id number]  PCP:  Gildardo Cranker, DO    No chief complaint on file.     History of Present Illness: Meredith [name] is a 64 y.o. female who presents for followup of atrial fibrillation. She was recently seen by her Pcp for routine PE and was noted to have an irregular heart beat. EKG reportedly showed atrial fibrillation and she was started on Coumadin (no NOAC due to lack of insurance) for a CHADS2VASC score of 3 by her PCP.  When I saw her I reviewed the EKGs and there was no evidence of afib.  EKG showed NSR with PAC's and nonsustained atrial tachycardia.  Her Coumadin was stopped and she was started on Toprol.  2D echo showed normal LVF with mild LAE and mild LVH. TSH and BMET were normal. She has had some pressure in her chest that comes and goes.She complained of some pressure in her chest and nuclear stress test was done that was abnormal.  She underwent cath showing mild 20% left circ and RCA.  She wore an event monitor which showed frequent PAC's, nonsustained atrial tachycardia and PAF and was started on Xarelto and Toprol was increased.  She now presents back today for followup.  She denies any further chest pressure, SOB, DOE, LE edema, dizziness or syncope.  She denies any palpitations.   Past Medical History  Diagnosis Date  . Diabetes mellitus   . Hypertension   . Hyperlipidemia   . Osteoarthritis   . ANEMIA, IRON DEFICIENCY, UNSPEC. 02/05/2007    Qualifier: Diagnosis of  By: Para March MD, Cheree Ditto    . Ventral hernia   . Enteritis   . DJD (degenerative joint disease) of lumbar spine   . Diverticulosis 05/17/12  . Renal cyst, left 05/17/12  . Gastric ulcer 04/2000  . Colon polyps   . Irregular heart beat     Past Surgical History  Procedure Laterality Date  . Tubal ligation    . Uterine fibroid surgery    . Operative hysteroscopy    . Abdominal hysterectomy    . Left heart  catheterization with coronary angiogram N/A 02/16/2015    Procedure: LEFT HEART CATHETERIZATION WITH CORONARY ANGIOGRAM;  Surgeon: Kathleene Hazel, MD;  Location: [facility] CATH LAB;  Service: Cardiovascular;  Laterality: N/A;     Current Outpatient Prescriptions  Medication Sig Dispense Refill  . amLODipine (NORVASC) 5 MG tablet TAKE 1 TABLET EVERY DAY (Patient taking differently: TAKE 1 TABLET BY MOUTH EVERY DAY) 90 tablet 3  . diazepam (VALIUM) 5 MG tablet Take 1 tablet (5 mg total) by mouth every 8 (eight) hours as needed for anxiety. 5 tablet 0  . glucose blood ([facility] BLOOD GLUCOSE TEST STRIPS) test strip Use as instructed 100 each 12  . hydrochlorothiazide (HYDRODIURIL) 25 MG tablet TAKE 1 TABLET BY MOUTH EVERY DAY 30 tablet 3  . magnesium oxide (MAG-OX) 400 MG tablet Take 1 tablet (400 mg total) by mouth daily. 30 tablet 1  . metFORMIN (GLUCOPHAGE-XR) 500 MG 24 hr tablet TAKE 2 TABLETS BY MOUTH 2 TIMES A DAY 180 tablet 3  . metoprolol succinate (TOPROL-XL) 50 MG 24 hr tablet 1 and 1/2 tablet by mouth daily 45 tablet 5  . potassium chloride SA (KLOR-CON M20) 20 MEQ tablet Take 1 tablet (20 mEq total) by mouth daily. 30 tablet 1  . pravastatin (PRAVACHOL) 80 MG tablet TAKE 1  TABLET EVERY DAY (Patient taking differently: TAKE 1 TABLET BY MOUTH EVERY DAY) 90 tablet 3  . rivaroxaban (XARELTO) 20 MG TABS tablet Take 1 tablet (20 mg total) by mouth daily with supper. 30 tablet 6  . traMADol (ULTRAM) 50 MG tablet Take 1 tablet (50 mg total) by mouth every 6 (six) hours as needed. (Patient taking differently: Take 50 mg by mouth every 6 (six) hours as needed (pain). ) 12 tablet 0   No current facility-administered medications for this visit.    Allergies:   Flexeril; Lipitor; Lisinopril; and Metaxalone    Social History:  The patient  reports that she quit smoking about 36 years ago. Her smoking use included Cigarettes. She has never used smokeless tobacco. She reports that she does not drink  alcohol or use illicit drugs.   Family History:  The patient's family history includes Clotting disorder in her other; Diabetes in her mother; Kidney disease in her mother; Lung cancer in her father; Stomach cancer in her sister. There is no history of Colon cancer.    ROS:  Please see the history of present illness.   Otherwise, review of systems are positive for none.   All other systems are reviewed and negative.    PHYSICAL EXAM: VS:  BP 162/100 mmHg  Pulse 60  Ht 5\' 3"  (1.6 m)  Wt 242 lb 6.4 oz (109.952 kg)  BMI 42.95 kg/m2  SpO2 98% , BMI Body mass index is 42.95 kg/(m^2). GEN: Well nourished, well developed, in no acute distress HEENT: normal Neck: no JVD, carotid bruits, or masses Cardiac: RRR; no murmurs, rubs, or gallops,no edema  Respiratory:  clear to auscultation bilaterally, normal work of breathing GI: soft, nontender, nondistended, + BS MS: no deformity or atrophy Skin: warm and dry, no rash Neuro:  Strength and sensation are intact Psych: euthymic mood, full affect   EKG:  EKG is ordered today. The ekg ordered today demonstrates NSR with freqeuent PAC's, septal infarct and nonspecific ST abnormality   Recent Labs: 11/25/2014: Pro B Natriuretic peptide (BNP) 670.00*; TSH 3.429 01/20/2015: ALT 11 02/14/2015: Hemoglobin 12.6; Platelets 218.0 04/07/2015: BUN 14; Creatinine 0.74; Magnesium 1.6; Potassium 4.0; Sodium 139    Lipid Panel    Component Value Date/Time   CHOL 173 11/25/2014 1426   TRIG 135 11/25/2014 1426   HDL 53 11/25/2014 1426   CHOLHDL 3.3 11/25/2014 1426   VLDL 27 11/25/2014 1426   LDLCALC 93 11/25/2014 1426   LDLDIRECT 130* 05/13/2012 0917      Wt Readings from Last 3 Encounters:  04/20/15 242 lb 6.4 oz (109.952 kg)  03/31/15 243 lb (110.224 kg)  02/23/15 245 lb 12.8 oz (111.494 kg)       ASSESSMENT AND PLAN:  1.  PAF and nonsustained atrial tachycardia.  EKG today shows frequent PAC's but no afib.  HR 89.  Continue BB/Xarelto. 2.   HTN - BP elevated today but has not taken her meds.  Continue BB/amlodipine/HCTZ.  I have asked her to check her BP daily for a week and call with the results. 3.  Chest pain with false positive stress test with cath showing nonobstructive ASCAD.  She has not had any further chest discomfort.   Current medicines are reviewed at length with the patient today.  The patient does not have concerns regarding medicines.  The following changes have been made:  no change  Labs/ tests ordered today include: see above assessment and plan No orders of the defined types were  placed in this encounter.     Disposition:   FU with me in 6 months   Signed, Quintella ReichertURNER,TRACI R, MD  04/20/2015 8:43 AM    [facility] [address], [place], [place]  [address] Phone: [phone]; Fax: [phone]

## 2015-05-13 ENCOUNTER — Other Ambulatory Visit: Payer: Self-pay | Admitting: Family Medicine

## 2015-05-18 ENCOUNTER — Other Ambulatory Visit: Payer: Self-pay | Admitting: Family Medicine

## 2015-05-22 ENCOUNTER — Telehealth: Payer: Self-pay | Admitting: Physician Assistant

## 2015-05-22 NOTE — Telephone Encounter (Signed)
New Message    [facility] is calling in to get [facility] perscription for Met[facility]rolol.  Contact person is [employer]

## 2015-05-25 ENCOUNTER — Other Ambulatory Visit: Payer: Self-pay

## 2015-05-25 DIAGNOSIS — Z1231 Encounter for screening mammogram for malignant neoplasm of breast: Secondary | ICD-10-CM

## 2015-06-08 ENCOUNTER — Ambulatory Visit
Admission: RE | Admit: 2015-06-08 | Discharge: 2015-06-08 | Disposition: A | Discharge status: Home / Self Care | Payer: Commercial Managed Care - HMO | Source: Ambulatory Visit

## 2015-06-08 DIAGNOSIS — Z1231 Encounter for screening mammogram for malignant neoplasm of breast: Secondary | ICD-10-CM | POA: Diagnosis not present

## 2015-06-12 ENCOUNTER — Other Ambulatory Visit: Payer: Self-pay | Admitting: Family Medicine

## 2015-06-14 NOTE — Telephone Encounter (Signed)
LVM for pt to return call to inform her of below and see about scheduling this appointment. Lamonte SakaiZimmerman Rumple, April D, [place]

## 2015-06-19 ENCOUNTER — Telehealth: Payer: Self-pay | Admitting: *Deleted

## 2015-06-19 NOTE — Telephone Encounter (Signed)
Needs to get it from PCP

## 2015-06-19 NOTE — Telephone Encounter (Signed)
[facility] a refill of hctz for the patient. This medication was prescribed and is usually filled by her pcp, but it looks like the pcp isn't refilling it due to her needing an ov. Please advise. Thanks, MI

## 2015-06-19 NOTE — Telephone Encounter (Signed)
[facility] is aware to follow up with pcp about rx.

## 2015-06-23 ENCOUNTER — Encounter: Payer: Self-pay | Admitting: Family Medicine

## 2015-06-23 ENCOUNTER — Ambulatory Visit (INDEPENDENT_AMBULATORY_CARE_PROVIDER_SITE_OTHER): Payer: Commercial Managed Care - HMO | Admitting: Family Medicine

## 2015-06-23 VITALS — BP 169/91 | HR 74 | Temp 97.9°F | Ht 63.0 in | Wt 241.5 lb

## 2015-06-23 DIAGNOSIS — E119 Type 2 diabetes mellitus without complications: Secondary | ICD-10-CM | POA: Diagnosis not present

## 2015-06-23 DIAGNOSIS — I10 Essential (primary) hypertension: Secondary | ICD-10-CM | POA: Diagnosis not present

## 2015-06-23 LAB — POCT GLYCOSYLATED HEMOGLOBIN (HGB A1C): Hemoglobin A1C: 6.4

## 2015-06-23 MED ORDER — METOPROLOL SUCCINATE ER 50 MG PO TB24: ORAL_TABLET | ORAL | Status: DC

## 2015-06-23 MED ORDER — METFORMIN HCL ER 500 MG PO TB24: ORAL_TABLET | ORAL | Status: DC

## 2015-06-23 MED ORDER — HYDROCHLOROTHIAZIDE 12.5 MG PO TABS: 12.5000 mg | ORAL_TABLET | Freq: Every day | ORAL | Status: DC

## 2015-06-23 NOTE — Patient Instructions (Addendum)
It was so nice meeting you today! Thank you for coming in.  We talked about your high blood pressure and how it has improved. I'd like to decrease your hydrochlorothiazide in half to 12.5mg . I have sent this prescription as well as metformin and metoprolol to your pharmacy.  I'd like to see you back in 4-6 weeks.   Hypertension Hypertension, commonly called high blood pressure, is when the force of blood pumping through your arteries is too strong. Your arteries are the blood vessels that carry blood from your heart throughout your body. A blood pressure reading consists of a higher number over a lower number, such as 110/72. The higher number (systolic) is the pressure inside your arteries when your heart pumps. The lower number (diastolic) is the pressure inside your arteries when your heart relaxes. Ideally you want your blood pressure below 120/80. Hypertension forces your heart to work harder to pump blood. Your arteries may become narrow or stiff. Having hypertension puts you at risk for heart disease, stroke, and other problems.  RISK FACTORS Some risk factors for high blood pressure are controllable. Others are not.  Risk factors you cannot control include:   Race. You may be at higher risk if you are African American.  Age. Risk increases with age.  Gender. Men are at higher risk than women before age 13 years. After age 11, women are at higher risk than men. Risk factors you can control include:  Not getting enough exercise or physical activity.  Being overweight.  Getting too much fat, sugar, calories, or salt in your diet.  Drinking too much alcohol. SIGNS AND SYMPTOMS Hypertension does not usually cause signs or symptoms. Extremely high blood pressure (hypertensive crisis) may cause headache, anxiety, shortness of breath, and nosebleed. DIAGNOSIS  To check if you have hypertension, your health care provider will measure your blood pressure while you are seated, with your arm  held at the level of your heart. It should be measured at least twice using the same arm. Certain conditions can cause a difference in blood pressure between your right and left arms. A blood pressure reading that is higher than normal on one occasion does not mean that you need treatment. If one blood pressure reading is high, ask your health care provider about having it checked again. TREATMENT  Treating high blood pressure includes making lifestyle changes and possibly taking medicine. Living a healthy lifestyle can help lower high blood pressure. You may need to change some of your habits. Lifestyle changes may include:  Following the DASH diet. This diet is high in fruits, vegetables, and whole grains. It is low in salt, red meat, and added sugars.  Getting at least 2 hours of brisk physical activity every week.  Losing weight if necessary.  Not smoking.  Limiting alcoholic beverages.  Learning ways to reduce stress. If lifestyle changes are not enough to get your blood pressure under control, your health care provider may prescribe medicine. You may need to take more than one. Work closely with your health care provider to understand the risks and benefits. HOME CARE INSTRUCTIONS  Have your blood pressure rechecked as directed by your health care provider.   Take medicines only as directed by your health care provider. Follow the directions carefully. Blood pressure medicines must be taken as prescribed. The medicine does not work as well when you skip doses. Skipping doses also puts you at risk for problems.   Do not smoke.   Monitor your blood  pressure at home as directed by your health care provider. SEEK MEDICAL CARE IF:   You think you are having a reaction to medicines taken.  You have recurrent headaches or feel dizzy.  You have swelling in your ankles.  You have trouble with your vision. SEEK IMMEDIATE MEDICAL CARE IF:  You develop a severe headache or  confusion.  You have unusual weakness, numbness, or feel faint.  You have severe chest or abdominal pain.  You vomit repeatedly.  You have trouble breathing. MAKE SURE YOU:   Understand these instructions.  Will watch your condition.  Will get help right away if you are not doing well or get worse. Document Released: 11/25/2005 Document Revised: 04/11/2014 Document Reviewed: 09/17/2013 [facility] Patient Information 2015 [place], [place]. This information is not intended to replace advice given to you by your health care provider. Make sure you discuss any questions you have with your health care provider.

## 2015-06-23 NOTE — Progress Notes (Signed)
Subjective:    Patient ID: Meredith [name], female    DOB: 06/05/1966, 64 y.o.   MRN: [id number]  HPI  Meredith [name] is here for for medication refills for her HTN and T2DM.   Hypertension The last time the patient was seen in clinic was about 6 months ago. She has not been taking her HCTZ for the last couple weeks because she ran out. When she came to the clinic her BP was elevated at 169/91. The patient admitted she gets nervous when she comes to the doctor and it tends to be higher. I retook her BP and it had come down to 142/82. She denies any headaches, vision changes, or chest pain at this time. She does she a cardiologist for a-fib management.  Type 2 DM Patient's hgb A1c was 6.4 today. Her last A1C that was done in clinic was 6.8 on 11/25/14. She states she has been baking her food more rather than frying it and trying to stay away from bread. Her activity level does had not changed since last visit. She is currently taking Metformin for diabetes control and she states she tolerates this medication well and never misses a dose.    Health Maintenance:  - Overdue for a diabetic foot exam, will do today -Overdue for an opthalmologic exam, she said she is making an appointment soon.    Health Maintenance Due  Topic Date Due  . HIV Screening  03/03/1966  . ZOSTAVAX  03/03/2010  . OPHTHALMOLOGY EXAM  09/13/2014  . DEXA SCAN  03/04/2015  . PNA vac Low Risk Adult (1 of 2 - PCV13) 03/04/2015  . HEMOGLOBIN A1C  05/27/2015   SHx-  reports that she quit smoking about 36 years ago. Her smoking use included Cigarettes. She has never used smokeless tobacco.  - Past Medical History: Patient Active Problem List   Diagnosis Date Noted  . Atrial fibrillation 02/23/2015  . PAC (premature atrial contraction) 01/19/2015  . Atrial tachycardia, paroxysmal 01/19/2015  . Chest pain 01/19/2015  . Irregular heart rhythm 11/25/2014  . ASTHMA, INTERMITTENT 09/07/2010  . HERNIA, VENTRAL  04/18/2010  . 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 Diabetes mellitus type II, controlled, with no complications 02/05/2007  . HYPERCHOLESTEROLEMIA 02/05/2007  . Obesity 02/05/2007  . DEPRESSION, MAJOR, RECURRENT 02/05/2007  . HYPERTENSION, BENIGN SYSTEMIC 02/05/2007  . GASTROESOPHAGEAL REFLUX, NO ESOPHAGITIS 02/05/2007  . OSTEOARTHRITIS, MULTI SITES 02/05/2007   - Medications: reviewed and updated Current Outpatient Prescriptions  Medication Sig Dispense Refill  . amLODipine (NORVASC) 5 MG tablet TAKE 1 TABLET EVERY DAY 90 tablet 3  . diazepam (VALIUM) 5 MG tablet Take 1 tablet (5 mg total) by mouth every 8 (eight) hours as needed for anxiety. 5 tablet 0  . glucose blood ([facility] BLOOD GLUCOSE TEST STRIPS) test strip Use as instructed 100 each 12  . hydrochlorothiazide (HYDRODIURIL) 12.5 MG tablet Take 1 tablet (12.5 mg total) by mouth daily. 30 tablet 3  . lidocaine (XYLOCAINE) 5 % ointment     . magnesium oxide (MAG-OX) 400 MG tablet Take 1 tablet (400 mg total) by mouth daily. 30 tablet 1  . metFORMIN (GLUCOPHAGE-XR) 500 MG 24 hr tablet TAKE 2 TABLETS BY MOUTH 2 TIMES A DAY 180 tablet 2  . metoprolol succinate (TOPROL-XL) 50 MG 24 hr tablet 1 and 1/2 tablet by mouth daily 45 tablet 5  . potassium chloride SA (KLOR-CON M20) 20 MEQ tablet Take 1 tablet (20 mEq total) by mouth daily. 30 tablet  1  . pravastatin (PRAVACHOL) 80 MG tablet TAKE 1 TABLET EVERY DAY (Patient taking differently: TAKE 1 TABLET BY MOUTH EVERY DAY) 90 tablet 3  . rivaroxaban (XARELTO) 20 MG TABS tablet Take 1 tablet (20 mg total) by mouth daily with supper. 30 tablet 6  . traMADol (ULTRAM) 50 MG tablet Take 1 tablet (50 mg total) by mouth every 6 (six) hours as needed. (Patient taking differently: Take 50 mg by mouth every 6 (six) hours as needed (pain). ) 12 tablet 0   No current facility-administered medications for this visit.   Review of Systems See HPI     Objective:   Physical Exam BP 169/91  mmHg  Pulse 74  Temp(Src) 97.9 F (36.6 C) (Oral)  Ht  (1.6 m)  Wt 241 lb 8 oz (109.544 kg)  BMI 42.79 kg/m2 Gen: NAD, alert, cooperative with exam, obese HEENT: NCAT, PERRL, clear conjunctiva, oropharynx clear, supple neck CV: RRR, good S1/S2, no murmur, no edema, capillary refill brisk  Resp: CTABL, no wheezes, non-labored Abd: SNTND, BS present, no guarding or organomegaly Skin: no rashes, normal turgor  Neuro: no gross deficits.  Psych: good insight, alert and oriented     Assessment & Plan:

## 2015-06-26 NOTE — Assessment & Plan Note (Signed)
-  Decrease HCTZ dosage due to 12.5mg  due to patient's blood pressure being 142/83 when it was retaken in the exam room. If BP stays around the same value, consider discontinuing medication.  -Continue Metoprolol

## 2015-06-26 NOTE — Assessment & Plan Note (Signed)
-  Continue Metformin XR  -Continue to encourage patient to exercise and maintain a low carbohydrate diet.

## 2015-07-09 IMAGING — CR DG CHEST 2V
2 series · 2 of 2 positions shown · non-contrast
Comparison: Prior chest x-ray 05/31/2013

CLINICAL DATA: 63-year-old female with chest pain. Preprocedural
chest x-ray prior to cardiac catheterization

EXAM:
CHEST  2 VIEW

[w chest pa]
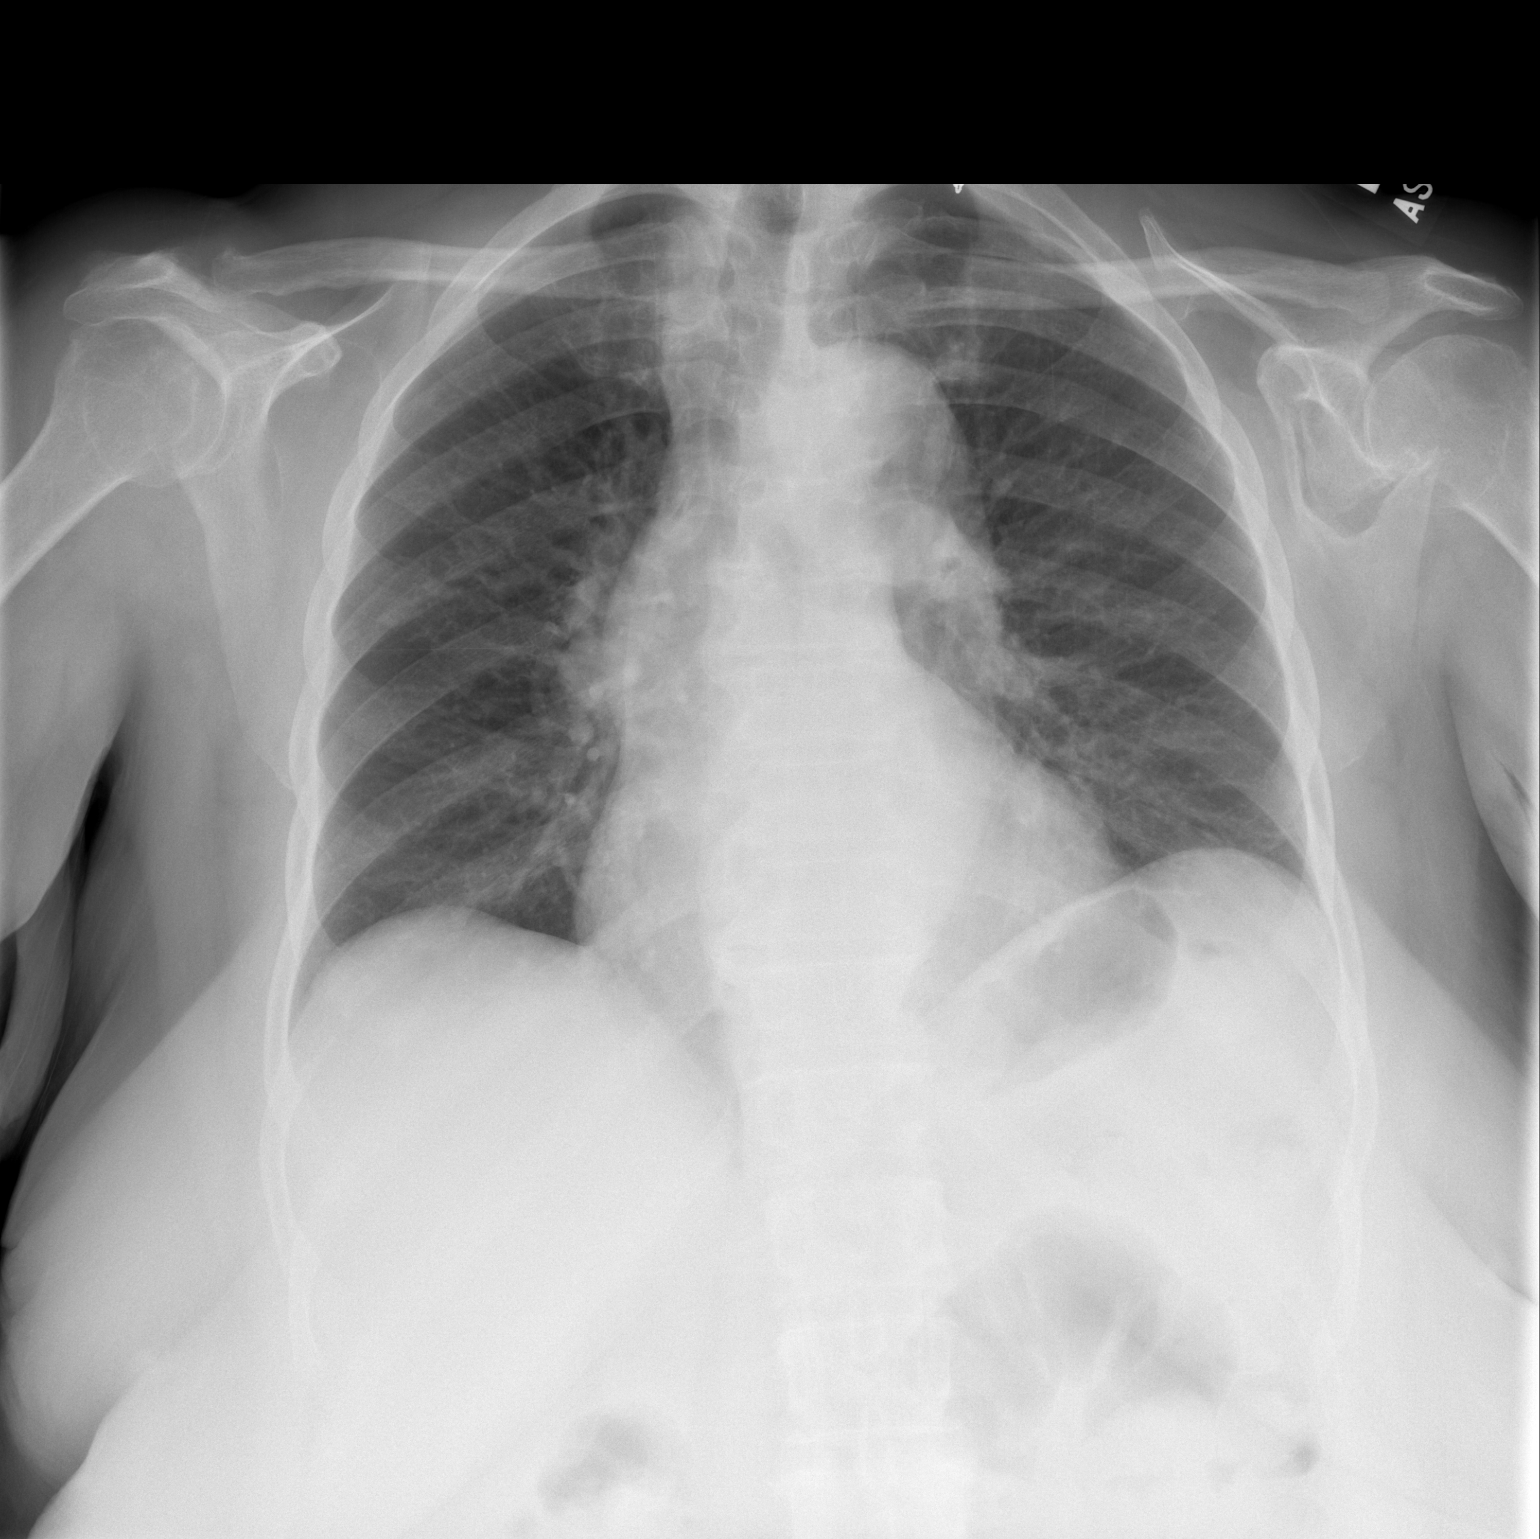

[w chest lat]
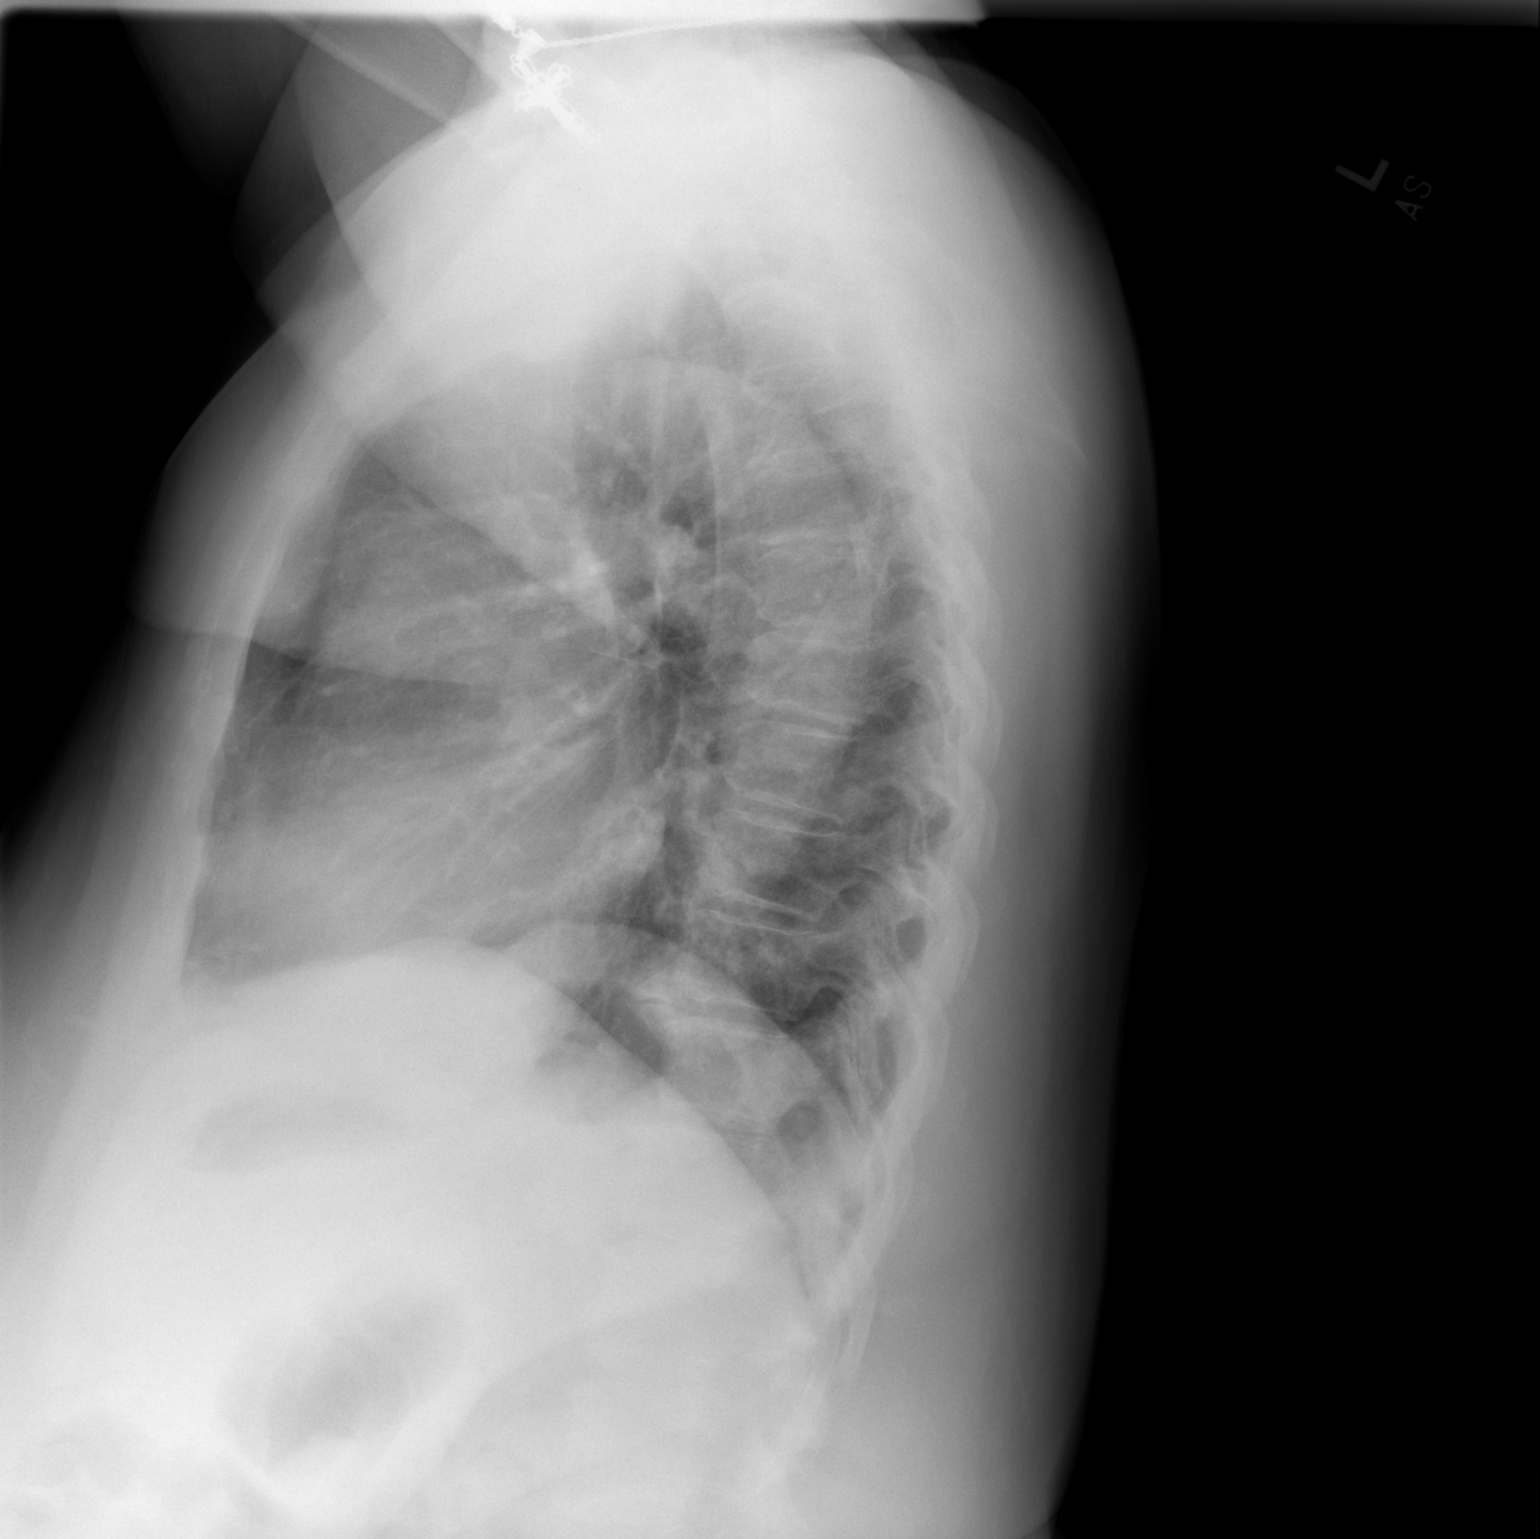

[2 of 2 positions shown; findings below may reference images not displayed]

FINDINGS: Stable cardiac and mediastinal contours. No focal airspace
consolidation, pleural effusion, pneumothorax or pulmonary edema.
Central bronchitic changes are similar compared to prior. No acute
osseous abnormality. Similar appearance of the left scapula
consistent with remote healed fracture. No acute osseous
abnormality.
IMPRESSION: Stable chest x-ray without evidence of acute cardiopulmonary
process.

## 2015-07-31 ENCOUNTER — Telehealth: Payer: Self-pay | Admitting: Family Medicine

## 2015-07-31 NOTE — Telephone Encounter (Signed)
Pt called back to ask about a bruise on her inner thigh area.  Concerned that it may come from

## 2015-08-01 NOTE — Telephone Encounter (Signed)
Spoke with patient regarding bruise on inner thigh.  Patient stated she was sitting on the sofa and felt like a sting and a bruise was on her thigh.  Patient denies hitting her leg and denies any other symptoms.  Patient applied alcohol, hyrdocoristone cream and the bruise is almost gone.  Advised patient that if bruise occur or get worse to call for an appointment for a provider to assess the area.  Will forward to PCP.  Clovis Pu, RN

## 2015-08-11 ENCOUNTER — Emergency Department (HOSPITAL_COMMUNITY)
Admission: EM | Admit: 2015-08-11 | Discharge: 2015-08-11 | Disposition: A | Discharge status: Home / Self Care | Payer: Commercial Managed Care - HMO | Attending: Emergency Medicine | Admitting: Emergency Medicine

## 2015-08-11 ENCOUNTER — Encounter (HOSPITAL_COMMUNITY): Payer: Self-pay | Admitting: *Deleted

## 2015-08-11 ENCOUNTER — Emergency Department (HOSPITAL_COMMUNITY): Payer: Commercial Managed Care - HMO

## 2015-08-11 DIAGNOSIS — Z79899 Other long term (current) drug therapy: Secondary | ICD-10-CM | POA: Diagnosis not present

## 2015-08-11 DIAGNOSIS — Z87891 Personal history of nicotine dependence: Secondary | ICD-10-CM | POA: Insufficient documentation

## 2015-08-11 DIAGNOSIS — E119 Type 2 diabetes mellitus without complications: Secondary | ICD-10-CM | POA: Insufficient documentation

## 2015-08-11 DIAGNOSIS — M549 Dorsalgia, unspecified: Secondary | ICD-10-CM

## 2015-08-11 DIAGNOSIS — Z8719 Personal history of other diseases of the digestive system: Secondary | ICD-10-CM | POA: Diagnosis not present

## 2015-08-11 DIAGNOSIS — Z87448 Personal history of other diseases of urinary system: Secondary | ICD-10-CM | POA: Insufficient documentation

## 2015-08-11 DIAGNOSIS — E669 Obesity, unspecified: Secondary | ICD-10-CM | POA: Insufficient documentation

## 2015-08-11 DIAGNOSIS — Z86010 Personal history of colonic polyps: Secondary | ICD-10-CM | POA: Diagnosis not present

## 2015-08-11 DIAGNOSIS — E785 Hyperlipidemia, unspecified: Secondary | ICD-10-CM | POA: Insufficient documentation

## 2015-08-11 DIAGNOSIS — I10 Essential (primary) hypertension: Secondary | ICD-10-CM | POA: Insufficient documentation

## 2015-08-11 DIAGNOSIS — Z862 Personal history of diseases of the blood and blood-forming organs and certain disorders involving the immune mechanism: Secondary | ICD-10-CM | POA: Insufficient documentation

## 2015-08-11 DIAGNOSIS — M545 Low back pain: Secondary | ICD-10-CM | POA: Insufficient documentation

## 2015-08-11 HISTORY — DX: Obesity, unspecified: E66.9

## 2015-08-11 LAB — URINALYSIS, ROUTINE W REFLEX MICROSCOPIC
Bilirubin Urine: NEGATIVE
Glucose, UA: NEGATIVE mg/dL
Ketones, ur: NEGATIVE mg/dL
LEUKOCYTES UA: NEGATIVE
Nitrite: NEGATIVE
PH: 5 (ref 5.0–8.0)
Protein, ur: NEGATIVE mg/dL
Specific Gravity, Urine: 1.02 (ref 1.005–1.030)
Urobilinogen, UA: 0.2 mg/dL (ref 0.0–1.0)

## 2015-08-11 LAB — URINE MICROSCOPIC-ADD ON

## 2015-08-11 MED ORDER — TRAMADOL HCL 50 MG PO TABS: 50.0000 mg | ORAL_TABLET | Freq: Four times a day (QID) | ORAL | Status: DC | PRN

## 2015-08-11 NOTE — ED Notes (Signed)
Pt stable, ambulatory, states understanding of discharge instructions 

## 2015-08-11 NOTE — Discharge Instructions (Signed)
1. Medications: tramadol, usual home medications 2. Treatment: rest, drink plenty of fluids, back exercises 3. Follow Up: please followup with your primary doctor for discussion of your diagnoses and further evaluation after today's visit; if you do not have a primary care doctor use the resource guide provided to find one; please return to the ER for repeated episodes of bowel or bladder incontinence, new numbness or tingling, weakness, numbness in the groin, severe pain, new or worsening symptoms   Back Exercises Back exercises help treat and prevent back injuries. The goal is to increase your strength in your belly (abdominal) and back muscles. These exercises can also help with flexibility. Start these exercises when told by your doctor. HOME CARE Back exercises include: Pelvic Tilt.  Lie on your back with your knees bent. Tilt your pelvis until the lower part of your back is against the floor. Hold this position 5 to 10 sec. Repeat this exercise 5 to 10 times. Knee to Chest.  Pull 1 knee up against your chest and hold for 20 to 30 seconds. Repeat this with the other knee. This may be done with the other leg straight or bent, whichever feels better. Then, pull both knees up against your chest. Sit-Ups or Curl-Ups.  Bend your knees 90 degrees. Start with tilting your pelvis, and do a partial, slow sit-up. Only lift your upper half 30 to 45 degrees off the floor. Take at least 2 to 3 seonds for each sit-up. Do not do sit-ups with your knees out straight. If partial sit-ups are difficult, simply do the above but with only tightening your belly (abdominal) muscles and holding it as told. Hip-Lift.  Lie on your back with your knees flexed 90 degrees. Push down with your feet and shoulders as you raise your hips 2 inches off the floor. Hold for 10 seconds, repeat 5 to 10 times. Back Arches.  Lie on your stomach. Prop yourself up on bent elbows. Slowly press on your hands, causing an arch in your  low back. Repeat 3 to 5 times. Shoulder-Lifts.  Lie face down with arms beside your body. Keep hips and belly pressed to floor as you slowly lift your head and shoulders off the floor. Do not overdo your exercises. Be careful in the beginning. Exercises may cause you some mild back discomfort. If the pain lasts for more than 15 minutes, stop the exercises until you see your doctor. Improvement with exercise for back problems is slow.  Document Released: 12/28/2010 Document Revised: 02/17/2012 Document Reviewed: 09/26/2011 [facility] Patient Information 2015 [place], [place]. This information is not intended to replace advice given to you by your health care provider. Make sure you discuss any questions you have with your health care provider.  Back Pain, Adult Back pain is very common. The pain often gets better over time. The cause of back pain is usually not dangerous. Most people can learn to manage their back pain on their own.  HOME CARE   Stay active. Start with short walks on flat ground if you can. Try to walk farther each day.  Do not sit, drive, or stand in one place for more than 30 minutes. Do not stay in bed.  Do not avoid exercise or work. Activity can help your back heal faster.  Be careful when you bend or lift an object. Bend at your knees, keep the object close to you, and do not twist.  Sleep on a firm mattress. Lie on your side, and bend your  knees. If you lie on your back, put a pillow under your knees.  Only take medicines as told by your doctor.  Put ice on the injured area.  Put ice in a plastic bag.  Place a towel between your skin and the bag.  Leave the ice on for 15-20 minutes, 03-04 times a day for the first 2 to 3 days. After that, you can switch between ice and heat packs.  Ask your doctor about back exercises or massage.  Avoid feeling anxious or stressed. Find good ways to deal with stress, such as exercise. GET HELP RIGHT AWAY IF:   Your pain does  not go away with rest or medicine.  Your pain does not go away in 1 week.  You have new problems.  You do not feel well.  The pain spreads into your legs.  You cannot control when you poop (bowel movement) or pee (urinate).  Your arms or legs feel weak or lose feeling (numbness).  You feel sick to your stomach (nauseous) or throw up (vomit).  You have belly (abdominal) pain.  You feel like you may pass out (faint). MAKE SURE YOU:   Understand these instructions.  Will watch your condition.  Will get help right away if you are not doing well or get worse. Document Released: 05/13/2008 Document Revised: 02/17/2012 Document Reviewed: 03/29/2014 [facility] Patient Information 2015 [place], [place]. This information is not intended to replace advice given to you by your health care provider. Make sure you discuss any questions you have with your health care provider.   Emergency Department Resource Guide 1) Find a Doctor and Pay Out of Pocket Although you won't have to find out who is covered by your insurance plan, it is a good idea to ask around and get recommendations. You will then need to call the office and see if the doctor you have chosen will accept you as a new patient and what types of options they offer for patients who are self-pay. Some doctors offer discounts or will set up payment plans for their patients who do not have insurance, but you will need to ask so you aren't surprised when you get to your appointment.  2) Contact Your Local Health Department Not all health departments have doctors that can see patients for sick visits, but many do, so it is worth a call to see if yours does. If you don't know where your local health department is, you can check in your phone book. The CDC also has a tool to help you locate your state's health department, and many state websites also have listings of all of their local health departments.  3) Find a Walk-in Clinic If your  illness is not likely to be very severe or complicated, you may want to try a walk in clinic. These are popping up all over the country in pharmacies, drugstores, and shopping centers. They're usually staffed by nurse practitioners or physician assistants that have been trained to treat common illnesses and complaints. They're usually fairly quick and inexpensive. However, if you have serious medical issues or chronic medical problems, these are probably not your best option.  No Primary Care Doctor: - Call Health Connect at  [phone] - they can help you locate a primary care doctor that  accepts your insurance, provides certain services, etc. - Physician Referral Service- [phone]  Chronic Pain Problems: Organization         Address  Phone   Notes  [facility]  [phone] Patients need to be referred by their primary care doctor.   Medication Assistance: Organization         Address  Phone   Notes  [facility] [address] [place] [place], [place] [address] [phone] --Must be a resident of [facility] -- Must have NO insurance coverage whatsoever (no Medicaid/ Medicare, etc.) -- The pt. MUST have a primary care doctor that directs their care regularly and follows them in the community   [facility]ist  [phone]   [employer]  [phone]    Agencies that provide inexpensive medical care: Organization         Address  Phone   Notes  [facility]  [phone]   [facility]    [phone]   [facility] [address] [place], [place] [address] [phone]   [facility] [address] [place], [place] [phone]   Planned Parenthood    [phone]   [facility]    [phone]   [facility]  [address], [place] Phone:  [phone], Fax:  [phone] Hours of Operation:   9 am - 6 pm, M-F.  Also accepts Medicaid/Medicare and self-pay.  [facility]  [address], [place] Phone: [phone], Fax: [phone]. Hours of Operation:  8:30 am - 5:30 pm, M-F.  Also accepts Medicaid and self-pay.  [facility] [place] [address], [place] Phone: [phone]   [facility] [address] Natasha Bence [place], [place] [phone], Ext. [phone] Mondays & Thursdays: 7-9 AM.  First 15 patients are seen on a first come, first serve basis.    Medicaid-accepting [facility] Providers:  Organization         Address  Phone   Notes  [facility] [address], [place] [phone] Also accepts self-pay patients.  [facility] [address] Laurell Josephs [place], [place]  [phone]   [facility] [address], [place] [phone]   [facility] [address] [place] [phone]   Renaye Rakers [address], [place]   [phone] Only accepts [place] Access [place] patients after they have their name applied to their card.   Self-Pay (no insurance) in [facility]:  Organization         Address  Phone   Notes  Sickle Cell Patients, [facility] Internal Medicine [address] [place], [place] [phone]   [facility] Urgent Care [address] [place], [place] [phone]   [facility] [place]  [address], [place] [phone]   [facility]/Dr. Osei-Bonsu  [address], [place] or [address], [place] [phone] Phone number for both [place] and [place] locations is the same.  [facility] [address], [place] [phone]   [facility] [address], [place] or [address] [phone]   [facility] [address] [place] [phone], phone; [phone], fax Sees patients 1st and 3rd Saturday of every month.  Must not qualify for public or private insurance (i.e. Medicaid, Medicare,  Health  Choice, Veterans' Benefits)  Household income should be no more than 200% of the poverty level The clinic cannot treat you if you are pregnant or think you are pregnant  Sexually transmitted diseases are not treated at the clinic.    Dental Care: Organization         Address  Phone  Notes  [facility] [address] [place], [place] [phone] Accepts children up to age 48 who are enrolled in [place] or [place] Health Choice; pregnant women with a Medicaid card; and children who have applied for Medicaid or [place] Health Choice, but were declined, whose parents can pay a reduced fee at time of service.  [facility] Department of [facility]  [address], [place] [phone] Accepts children up to age 59 who are enrolled in [place] or [place] Health Choice; pregnant women with a Medicaid card; and children who have applied for Medicaid or [place] Health Choice, but were declined, whose parents can pay a reduced fee at time of service.  [facility]  [address] [place], [place] [phone] Patients are seen by appointment only. Walk-ins are not accepted. [facility] will see patients 59 years of age and older. Monday - Tuesday (8am-5pm) Most Wednesdays (8:30-5pm) $30 per visit, cash only  [facility] Adult Dental Access PROGRAM  [address], [place] [facility] [phone] Patients are seen by appointment only. Walk-ins are not accepted. [facility] will see patients 77 years of age and older. One Wednesday Evening (Monthly: Volunteer Based).  $30 per visit, cash only  [employer]  [phone] for adults; Children under age 76, call Graduate Pediatric Dentistry at  [phone]. Children aged 58-14, please call [phone] to request a pediatric application.  Dental services are provided in all areas of dental care including fillings, crowns and bridges, complete and partial dentures, implants, gum treatment, root canals, and extractions. Preventive care is also provided. Treatment is provided to both adults and children. Patients are selected via a lottery and there is often a waiting list.   [facility] [address], [place]  [phone] [email or web address]   [facility] [address] [place], [place] [phone], Ext. [phone] Second and Fourth Thursday of each month, opens at 6:30 AM; Clinic ends at 9 AM.  Patients are seen on a first-come first-served basis, and a limited number are seen during each clinic.   [facility]  [address] [place], [place] [phone]   Eligibility Requirements You must have lived in [place], [place], or [place] counties for at least the last three months.   You cannot be eligible for state or federal sponsored [place], including [employer], [place], or [employer].   You generally cannot be eligible for healthcare insurance through your employer.    How to apply: Eligibility screenings are held every Tuesday and Wednesday afternoon from 1:00 pm until 4:00 pm. You do not need an appointment for the interview!  [facility] [address], [place], [place] [phone]   [facility] Health Department  [phone]   [facility] Health Department  [phone]   [facility] Health Department  [phone]    Behavioral Health Resources in the Community: Intensive Outpatient Programs Organization         Address  Phone  Notes  [facility] [address] [place], [place] [phone]   [facility] Outpatient [address] [place], [place] [phone]   [facility]  D[facility] [address] [place], [place]  [phone]   [facility] Mental Health [address],  [place], [place] [phone] or [phone]   Substance Abuse Resources Organization         Address  Phone  Notes  [facility]  [phone]   [facility]  [phone]   [facility]  [phone]   Floydene Flock  [phone]   Residential & Outpatient Substance Abuse Program  7654683292   Psychological Services Organization         Address  Phone  Notes  [facility]  [phone]   [facility] Services  [phone]   [facility] Mental Health [address] [place] [phone] or [phone]    Mobile Crisis Teams Organization         Address  Phone  Notes  [facility], Mobile Crisis Care Unit  [phone]   [facility]  [address] [place], [place] [phone]   Doristine Locks [address] [place] [phone]    Self-Help/Support Groups Organization         Address  Phone             Notes  [facility] [place] - variety of support groups  336- I7437963 Call for more information  Narcotics Anonymous (NA), [facility] [address], [employer] [place]  2 meetings at this location   Statistician         Address  Phone  Notes  [facility] [address],    [place]  [phone]   [facility]  [address], [place] [address], [place], [place] [phone]   [facility] [address] [place], [place] [phone] Admissions: 8am-3pm M-F  [facility] [address],    [place], [place] [phone]   [facility] [address] [place], [place], [place] [phone]   [facility] [address]  [place], [place] [phone]   [facility] - Intensive Outpatient [address] Laurell Josephs [address], [place], [place]  [phone]   [facility] ([facility] [address] [place]  [place], [place] [phone] or [phone]   [facility] ([facility]) [address] [place], [place] [phone] Accepts Medicaid  [facility] [address],  [place] [phone] Substance Abuse/Addiction Treatment   [facility] Organization         Address  Phone  Notes  [facility]  [phone]   Angie Fava, PhD [address] Ervin Knack [place], [place]   [phone] or [phone]   [facility] Behavioral   [address] [place], [place] [phone]   [facility] [address] [place], [place] [phone] Insurance/Medicaid/sponsorship through [facility] and Families [address]                                    [place], [place] [phone] Therapy/tele-psych/case  [facility] [address], [place] [phone]    Dr. Lolly Mustache  [phone]   [facility]  [employer] [facility] Dept. 1) [address]  2) [place] 3)  [place], [place] [phone]  [phone]   [facility] Child Abuse Hotline 717-740-8457 or [phone] (After Hours)

## 2015-08-11 NOTE — ED Provider Notes (Signed)
CSN: [id number]     Arrival date & time 08/11/15  1606 History  This chart was scribed for non-physician practitioner, Glean Hess, PA-C working with Bethann Berkshire, MD by Gwenyth Ober, ED scribe. This patient was seen in room TR07C/TR07C and the patient's care was started at 5:34 PM   Chief Complaint  Patient presents with  . Back Pain   The history is provided by the patient. No language interpreter was used.    HPI Comments: Meredith [name] is a 64 y.o. female with a history of DM, HTN, atrial fibrillation on xarelto, osteoarthritis, degenerative joint disease of the lumbar spine who presents to the Emergency Department complaining of intermittent, moderate, bilateral lower back pain that started yesterday. Pt reports onset of pain started while trying to get in and out of her car. Her pain also occurs with walking stairs. Pt has not tried any treatment for her symptoms. Pt has a history of back pain, but states current symptoms are different than prior episodes. She notes one episode of bladder and bowel incontinence that occurred yesterday after she ate lunch. Pt states she felt that she could not make it to the bathroom on time, but also could not control her bladder. Pt denies recent injuries or fall. She also denies fever, chills, HA, dizziness, light-headedness, cough, congestion, CP, SOB, heart palpitations, leg swelling, abdominal pain, nausea, vomiting, diarrhea, constipation, blood in her stool, difficulty urinating, dysuria, numbness, unilateral weakness, saddle anesthesia, generalized weakness, and fatigue as associated symptoms.  Past Medical History  Diagnosis Date  . Diabetes mellitus   . Hypertension   . Hyperlipidemia   . Osteoarthritis   . ANEMIA, IRON DEFICIENCY, UNSPEC. 02/05/2007    Qualifier: Diagnosis of  By: Para March MD, Cheree Ditto    . Ventral hernia   . Enteritis   . DJD (degenerative joint disease) of lumbar spine   . Diverticulosis 05/17/12  . Renal cyst, left  05/17/12  . Gastric ulcer 04/2000  . Colon polyps   . Irregular heart beat   . Obesity    Past Surgical History  Procedure Laterality Date  . Tubal ligation    . Uterine fibroid surgery    . Operative hysteroscopy    . Abdominal hysterectomy    . Left heart catheterization with coronary angiogram N/A 02/16/2015    Procedure: LEFT HEART CATHETERIZATION WITH CORONARY ANGIOGRAM;  Surgeon: Kathleene Hazel, MD;  Location: [facility] CATH LAB;  Service: Cardiovascular;  Laterality: N/A;   Family History  Problem Relation Age of Onset  . Diabetes Mother   . Kidney disease Mother   . Lung cancer Father   . Stomach cancer Sister   . Colon cancer Neg Hx   . Clotting disorder Other     neice   Social History  Substance Use Topics  . Smoking status: Former Smoker    Types: Cigarettes    Quit date: 12/09/1978  . Smokeless tobacco: Never Used  . Alcohol Use: No   OB History    No data available     Review of Systems  Constitutional: Negative for fever, chills, activity change, appetite change and fatigue.  HENT: Negative for congestion.   Eyes: Negative for visual disturbance.  Respiratory: Negative for cough and shortness of breath.   Cardiovascular: Negative for chest pain, palpitations and leg swelling.  Gastrointestinal: Negative for nausea, vomiting, abdominal pain, diarrhea, constipation, blood in stool and abdominal distention.  Genitourinary: Negative for dysuria, urgency, frequency and difficulty urinating.  Musculoskeletal: Positive for  back pain. Negative for myalgias, arthralgias, gait problem, neck pain and neck stiffness.  Skin: Negative for color change, pallor, rash and wound.  Neurological: Negative for dizziness, syncope, weakness, light-headedness, numbness and headaches.  All other systems reviewed and are negative.  Allergies  Flexeril; Lipitor; Lisinopril; and Metaxalone  Home Medications   Prior to Admission medications   Medication Sig Start Date End Date  Taking? Authorizing Provider  amLODipine (NORVASC) 5 MG tablet TAKE 1 TABLET EVERY DAY 05/18/15   Twana First Hess, DO  diazepam (VALIUM) 5 MG tablet Take 1 tablet (5 mg total) by mouth every 8 (eight) hours as needed for anxiety. 01/20/15   Roxy Horseman, PA-C  glucose blood ([facility] BLOOD GLUCOSE TEST STRIPS) test strip Use as instructed 11/24/13   Twana First Hess, DO  hydrochlorothiazide (HYDRODIURIL) 12.5 MG tablet Take 1 tablet (12.5 mg total) by mouth daily. 06/23/15   Beaulah Dinning, MD  lidocaine (XYLOCAINE) 5 % ointment  04/28/15   Historical Provider, MD  magnesium oxide (MAG-OX) 400 MG tablet Take 1 tablet (400 mg total) by mouth daily. 03/31/15   Beatrice Lecher, PA-C  metFORMIN (GLUCOPHAGE-XR) 500 MG 24 hr tablet TAKE 2 TABLETS BY MOUTH 2 TIMES A DAY 06/23/15   Beaulah Dinning, MD  metoprolol succinate (TOPROL-XL) 50 MG 24 hr tablet 1 and 1/2 tablet by mouth daily 06/23/15   Beaulah Dinning, MD  potassium chloride SA (KLOR-CON M20) 20 MEQ tablet Take 1 tablet (20 mEq total) by mouth daily. 03/31/15   Beatrice Lecher, PA-C  pravastatin (PRAVACHOL) 80 MG tablet TAKE 1 TABLET EVERY DAY Patient taking differently: TAKE 1 TABLET BY MOUTH EVERY DAY 09/27/14   Briscoe Deutscher, DO  rivaroxaban (XARELTO) 20 MG TABS tablet Take 1 tablet (20 mg total) by mouth daily with supper. 02/23/15   Newman Nip, NP  traMADol (ULTRAM) 50 MG tablet Take 1 tablet (50 mg total) by mouth every 6 (six) hours as needed. Patient taking differently: Take 50 mg by mouth every 6 (six) hours as needed (pain).  03/11/15   Hanna Patel-Mills, PA-C   BP 181/89 mmHg  Pulse 76  Temp(Src) 97.8 F (36.6 C) (Oral)  Resp 18  SpO2 98% Physical Exam  Constitutional: She is oriented to person, place, and time. She appears well-developed and well-nourished. No distress.  HENT:  Head: Normocephalic and atraumatic.  Right Ear: External ear normal.  Left Ear: External ear normal.  Nose: Nose normal.  Mouth/Throat: Uvula is midline,  oropharynx is clear and moist and mucous membranes are normal. No oropharyngeal exudate.  Eyes: Conjunctivae, EOM and lids are normal. Pupils are equal, round, and reactive to light. Right eye exhibits no discharge. Left eye exhibits no discharge. No scleral icterus.  Neck: Normal range of motion and full passive range of motion without pain. Neck supple. No spinous process tenderness and no muscular tenderness present.  Cardiovascular: Normal rate, regular rhythm, normal heart sounds, intact distal pulses and normal pulses.   Regular rate and rhythm with occasional ectopy.  Pulmonary/Chest: Effort normal and breath sounds normal. No accessory muscle usage. No respiratory distress. She has no wheezes. She has no rales.  Abdominal: Soft. Normal appearance and bowel sounds are normal. She exhibits no distension and no mass. There is no tenderness. There is no rigidity, no rebound and no guarding.  Musculoskeletal: Normal range of motion. She exhibits no edema or tenderness.  No tenderness to palpation of spine at midline. No step-off or deformity.  No tenderness to palpation of lumbar paraspinal muscles.   Lymphadenopathy:    She has no cervical adenopathy.  Neurological: She is alert and oriented to person, place, and time. She has normal strength and normal reflexes. She displays normal reflexes. No cranial nerve deficit or sensory deficit. Coordination and gait normal.  Skin: Skin is warm, dry and intact. No rash noted. She is not diaphoretic. No erythema. No pallor.  Psychiatric: She has a normal mood and affect. Her speech is normal and behavior is normal. Judgment and thought content normal.  Nursing note and vitals reviewed.   ED Course  Procedures   DIAGNOSTIC STUDIES: Oxygen Saturation is 98% on RA, normal by my interpretation.    COORDINATION OF CARE: 5:50 PM Discussed treatment plan with pt which includes an x-ray of her lumbar spine and UA. Pt agreed to plan.   Labs Review Labs  Reviewed  URINALYSIS, ROUTINE W REFLEX MICROSCOPIC (NOT AT [facility]) - Abnormal; Notable for the following:    Hgb urine dipstick LARGE (*)    All other components within normal limits  URINE MICROSCOPIC-ADD ON - Abnormal; Notable for the following:    Bacteria, UA FEW (*)    All other components within normal limits    Imaging Review Dg Lumbar Spine Complete  08/11/2015   CLINICAL DATA:  64 year old female with lumbar pain for 2 weeks. No known injury.  EXAM: LUMBAR SPINE - COMPLETE 4+ VIEW  COMPARISON:  01/05/2015 CT and 10/04/2011 MR  FINDINGS: Five non rib-bearing lumbar type vertebra are again identified.  Grade 1 anterolisthesis of L5 on S1 is again noted.  There is no evidence of acute fracture or subluxation.  Moderate degenerative disc disease and facet arthropathy at L4-5 and L5-S1 again noted.  No focal bony lesions or spondylolysis identified.  Mild degenerative changes of the left SI joint noted.  IMPRESSION: No evidence of acute abnormality.  Moderate degenerative changes at L4-5 and L5-S1 with grade 1 anterolisthesis of L5 on S1 again noted.  Mild degenerative changes of the left SI joint.   Electronically Signed   By: Harmon Pier M.D.   On: 08/11/2015 18:41     Glean Hess, PA-C has personally reviewed and evaluated these images and lab results as part of the medical decision-making.   EKG Interpretation None      MDM   Final diagnoses:  Back pain, unspecified location    64 year old female presents with bilateral low back pain since yesterday. Reports episode of bowel and bladder incontinence, and states she felt like she could not make it to the bathroom on time and that she had no control over her bowel or bladder. Denies saddle anesthesia, weakness, numbness, paresthesia, history of cancer, IVDU. Denies recent injury. Denies abdominal pain, dysuria, urgency, frequency, urinary retention.  Patient is afebrile. Vitals stable. Normal neuro exam with no focal neuro  deficit. Strength, sensation, DTRs, coordination intact. Patient ambulates without difficulty. No TTP of midline spine or paraspinal muscles. No step-off or deformity. Low suspicion for fracture, abscess, hematoma, or cauda equina.   Patient had an abdominal CT in January, which demonstrated grade 1 anterolisthesis of L5 on S1 and chronic osseus fusion at L4-L5.   Patient is pain free in the ED. Will obtain x-ray of lumbar spine and UA to evaluate symptoms. Imaging of lumbar spine negative for acute abnormality. Reveals moderate degenerative changes at L4-5 and L5-S1 with grade I anterolisthesis of L5 on S1 and mild degenerative changes of left  SI joint. UA negative for infection. Large hemoglobin on dipstick with 11-20 RBC per hpf.   Will treat back pain with tramadol. Patient to follow-up with PCP. Return precautions discussed.    BP 181/89 mmHg  Pulse 76  Temp(Src) 97.8 F (36.6 C) (Oral)  Resp 18  SpO2 98%   I personally performed the services described in this documentation, which was scribed in my presence. The recorded information has been reviewed and is accurate.     Mady Gemma, PA-C 08/11/15 2029  Bethann Berkshire, MD 08/11/15 [phone]

## 2015-08-11 NOTE — ED Notes (Signed)
Pt reports lower back pain since yesterday. Denies injury or history of back pain. Denies urinary symptoms. Ambulatory at triage.

## 2015-08-17 ENCOUNTER — Ambulatory Visit (INDEPENDENT_AMBULATORY_CARE_PROVIDER_SITE_OTHER): Payer: Commercial Managed Care - HMO | Admitting: Family Medicine

## 2015-08-17 VITALS — BP 148/82 | HR 64 | Temp 98.4°F | Wt 246.0 lb

## 2015-08-17 DIAGNOSIS — M545 Low back pain, unspecified: Secondary | ICD-10-CM

## 2015-08-17 DIAGNOSIS — M549 Dorsalgia, unspecified: Secondary | ICD-10-CM | POA: Insufficient documentation

## 2015-08-17 MED ORDER — TRAMADOL HCL 50 MG PO TABS: 50.0000 mg | ORAL_TABLET | Freq: Four times a day (QID) | ORAL | Status: DC | PRN

## 2015-08-17 NOTE — Patient Instructions (Signed)
Thank you for coming in,   You have arthritis in your back and it will flare up from time to time.   If you have pain again we can send you to physical therapy   Please feel free to call with any questions or concerns at any time, at [phone]. --Dr. Jordan Likes

## 2015-08-17 NOTE — Progress Notes (Signed)
Subjective:    Meredith [name] - 64 y.o. female MRN [id number]  Date of birth: 1951/05/14  HPI  Meredith [name] is here for back pain.  Back Pain: Location: lumbar pain. Never had back pain like this before. This pain stopped her in her tracks.  Duration: started last Friday. 6 days.  Quality: 10/10 on Friday. Right now 5/10  Current Functional Status:  ADL's  Preceding Events: no falls. no trauma Alleviating Factors: hot water for the shower helps. Tramadol helps  Exacerbating Factors: pain with standing or walking  Hx of intervention: no surgery, no PT Hx of imaging: Imaging of her lumbar spine revealing for degenerative changes.  Red Flags: no weakness, no numbness and tingling, no impaired bowel or bladder function No reported radiculopathy.   Health Maintenance:  Health Maintenance Due  Topic Date Due  . Hepatitis C Screening  07/08/1951  . HIV Screening  03/03/1965  . ZOSTAVAX  03/03/2010  . PAP SMEAR  12/31/2010  . OPHTHALMOLOGY EXAM  09/13/2014  . DEXA SCAN  03/04/2015  . PNA vac Low Risk Adult (1 of 2 - PCV13) 03/04/2015  . FOOT EXAM  06/24/2015    -  reports that she quit smoking about 36 years ago. Her smoking use included Cigarettes. She has never used smokeless tobacco. - Review of Systems: Per HPI. - Past Medical History: Patient Active Problem List   Diagnosis Date Noted  . Back pain 08/17/2015  . Atrial fibrillation 02/23/2015  . PAC (premature atrial contraction) 01/19/2015  . Atrial tachycardia, paroxysmal 01/19/2015  . Irregular heart rhythm 11/25/2014  . ASTHMA, INTERMITTENT 09/07/2010  . HERNIA, VENTRAL 04/18/2010  . 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 Diabetes mellitus type II, controlled, with no complications 02/05/2007  . HYPERCHOLESTEROLEMIA 02/05/2007  . Obesity 02/05/2007  . DEPRESSION, MAJOR, RECURRENT 02/05/2007  . HYPERTENSION, BENIGN SYSTEMIC 02/05/2007  . GASTROESOPHAGEAL REFLUX, NO ESOPHAGITIS 02/05/2007  .  OSTEOARTHRITIS, MULTI SITES 02/05/2007   - Medications: reviewed and updated Current Outpatient Prescriptions  Medication Sig Dispense Refill  . amLODipine (NORVASC) 5 MG tablet TAKE 1 TABLET EVERY DAY 90 tablet 3  . diazepam (VALIUM) 5 MG tablet Take 1 tablet (5 mg total) by mouth every 8 (eight) hours as needed for anxiety. 5 tablet 0  . hydrochlorothiazide (HYDRODIURIL) 12.5 MG tablet Take 1 tablet (12.5 mg total) by mouth daily. 30 tablet 3  . magnesium oxide (MAG-OX) 400 MG tablet Take 1 tablet (400 mg total) by mouth daily. 30 tablet 1  . metFORMIN (GLUCOPHAGE-XR) 500 MG 24 hr tablet TAKE 2 TABLETS BY MOUTH 2 TIMES A DAY 180 tablet 2  . metoprolol succinate (TOPROL-XL) 50 MG 24 hr tablet 1 and 1/2 tablet by mouth daily 45 tablet 5  . potassium chloride SA (KLOR-CON M20) 20 MEQ tablet Take 1 tablet (20 mEq total) by mouth daily. 30 tablet 1  . pravastatin (PRAVACHOL) 80 MG tablet TAKE 1 TABLET EVERY DAY (Patient taking differently: TAKE 1 TABLET BY MOUTH EVERY DAY) 90 tablet 3  . rivaroxaban (XARELTO) 20 MG TABS tablet Take 1 tablet (20 mg total) by mouth daily with supper. 30 tablet 6  . traMADol (ULTRAM) 50 MG tablet Take 1 tablet (50 mg total) by mouth every 6 (six) hours as needed. 30 tablet 0   No current facility-administered medications for this visit.     Review of Systems See HPI     Objective:   Physical Exam BP 148/82 mmHg  Pulse 64  Temp(Src) 98.4 F (36.9 C) (Oral)  Wt 246 lb (111.585 kg) Gen: NAD, alert, cooperative with exam, well-appearing Back:  Appearance: No obvious deformity Palpation: Tenderness to palpation over the lumbar region. No thoracic or cervical spinal tenderness. Hip:  Appearance: Symmetric Palpation: No tenderness palpation along the greater trochanter Rotation Reduced: Exacerbated internal rotation and limited external rotation bilaterally Neuro: Strength hip flexion 5/5,  knee extension 5/5, knee flexion 5/5, dorsiflexion 5/5, plantar  flexion 5/5 Reflexes: patella 2/2 Bilateral  Achilles 2/2 Bilateral Straight Leg Raise: negative Sensation to light touch intact: yes     Assessment & Plan:   Back pain Back pain most likely secondary to arthritic changes. These were seen on imaging done in the ED where she is a recently evaluated. She denies any bowel or bladder incontinence or saddle anesthesia. Pain is much improved today compared to Friday - May want to avoid anti-inflammatory's with her cardiac history. - Tramadol #30 given to take as needed for severe pain. - Given home modalities - If pain reoccurs can consider trial of physical therapy versus burst of steroids versus IM steroids

## 2015-08-17 NOTE — Assessment & Plan Note (Addendum)
Back pain most likely secondary to arthritic changes. These were seen on imaging done in the ED where she is a recently evaluated. She denies any bowel or bladder incontinence or saddle anesthesia. Pain is much improved today compared to Friday - May want to avoid anti-inflammatory's with her cardiac history. - Tramadol #30 given to take as needed for severe pain. - Given home modalities - If pain reoccurs can consider trial of physical therapy versus burst of steroids versus IM steroids

## 2015-08-24 DIAGNOSIS — H524 Presbyopia: Secondary | ICD-10-CM | POA: Diagnosis not present

## 2015-08-24 DIAGNOSIS — H5210 Myopia, unspecified eye: Secondary | ICD-10-CM | POA: Diagnosis not present

## 2015-08-25 LAB — HM DIABETES EYE EXAM

## 2015-09-07 ENCOUNTER — Other Ambulatory Visit: Payer: Self-pay | Admitting: Nurse Practitioner

## 2015-10-05 ENCOUNTER — Other Ambulatory Visit: Payer: Self-pay | Admitting: Family Medicine

## 2015-10-05 NOTE — Telephone Encounter (Signed)
This is your patient, see refill request. 

## 2015-10-15 ENCOUNTER — Other Ambulatory Visit: Payer: Self-pay | Admitting: Family Medicine

## 2015-10-18 ENCOUNTER — Other Ambulatory Visit: Payer: Self-pay | Admitting: Family Medicine

## 2015-10-26 ENCOUNTER — Ambulatory Visit (INDEPENDENT_AMBULATORY_CARE_PROVIDER_SITE_OTHER): Payer: Commercial Managed Care - HMO | Admitting: Cardiology

## 2015-10-26 ENCOUNTER — Encounter: Payer: Self-pay | Admitting: Cardiology

## 2015-10-26 VITALS — BP 138/84 | HR 60 | Ht 63.0 in | Wt 253.6 lb

## 2015-10-26 DIAGNOSIS — I471 Supraventricular tachycardia: Secondary | ICD-10-CM | POA: Diagnosis not present

## 2015-10-26 DIAGNOSIS — I491 Atrial premature depolarization: Secondary | ICD-10-CM | POA: Diagnosis not present

## 2015-10-26 DIAGNOSIS — I2510 Atherosclerotic heart disease of native coronary artery without angina pectoris: Secondary | ICD-10-CM

## 2015-10-26 DIAGNOSIS — I10 Essential (primary) hypertension: Secondary | ICD-10-CM

## 2015-10-26 DIAGNOSIS — I4891 Unspecified atrial fibrillation: Secondary | ICD-10-CM | POA: Diagnosis not present

## 2015-10-26 DIAGNOSIS — I480 Paroxysmal atrial fibrillation: Secondary | ICD-10-CM | POA: Diagnosis not present

## 2015-10-26 DIAGNOSIS — E7800 Pure hypercholesterolemia, unspecified: Secondary | ICD-10-CM

## 2015-10-26 DIAGNOSIS — I4719 Other supraventricular tachycardia: Secondary | ICD-10-CM

## 2015-10-26 LAB — BASIC METABOLIC PANEL
BUN: 17 mg/dL (ref 7–25)
CHLORIDE: 103 mmol/L (ref 98–110)
CO2: 28 mmol/L (ref 20–31)
Calcium: 9.6 mg/dL (ref 8.6–10.4)
Creat: 0.64 mg/dL (ref 0.50–0.99)
GLUCOSE: 128 mg/dL — AB (ref 65–99)
POTASSIUM: 3.4 mmol/L — AB (ref 3.5–5.3)
Sodium: 140 mmol/L (ref 135–146)

## 2015-10-26 LAB — CBC WITH DIFFERENTIAL/PLATELET
BASOS ABS: 0 10*3/uL (ref 0.0–0.1)
BASOS PCT: 0 % (ref 0–1)
EOS PCT: 2 % (ref 0–5)
Eosinophils Absolute: 0.1 10*3/uL (ref 0.0–0.7)
HCT: 35.2 % — ABNORMAL LOW (ref 36.0–46.0)
HEMOGLOBIN: 12.3 g/dL (ref 12.0–15.0)
LYMPHS ABS: 1.7 10*3/uL (ref 0.7–4.0)
LYMPHS PCT: 27 % (ref 12–46)
MCH: 26.7 pg (ref 26.0–34.0)
MCHC: 34.9 g/dL (ref 30.0–36.0)
MCV: 76.5 fL — ABNORMAL LOW (ref 78.0–100.0)
MONO ABS: 0.4 10*3/uL (ref 0.1–1.0)
MPV: 9.6 fL (ref 8.6–12.4)
Monocytes Relative: 7 % (ref 3–12)
Neutro Abs: 4.1 10*3/uL (ref 1.7–7.7)
Neutrophils Relative %: 64 % (ref 43–77)
Platelets: 229 10*3/uL (ref 150–400)
RBC: 4.6 MIL/uL (ref 3.87–5.11)
RDW: 14.2 % (ref 11.5–15.5)
WBC: 6.4 10*3/uL (ref 4.0–10.5)

## 2015-10-26 NOTE — Progress Notes (Signed)
Cardiology Office Note   Date:  10/26/2015   ID:  Meredith [name], DOB 1950-12-10, MRN [id number]  PCP:  Beaulah Dinning, MD    Chief Complaint  Patient presents with  . Coronary Artery Disease  . Hypertension  . Atrial Fibrillation      History of Present Illness: Meredith [name] is a 64 y.o. 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  She presents back today for followup. She denies chest pressure, SOB, DOE, LE edema, dizziness or syncope. She denies any palpitations.    Past Medical History  Diagnosis Date  . Diabetes mellitus   . Hypertension   . Hyperlipidemia   . Osteoarthritis   . ANEMIA, IRON DEFICIENCY, UNSPEC. 02/05/2007    Qualifier: Diagnosis of  By: Para March MD, Cheree Ditto    . Ventral hernia   . Enteritis   . DJD (degenerative joint disease) of lumbar spine   . Diverticulosis 05/17/12  . Renal cyst, left 05/17/12  . Gastric ulcer 04/2000  . Colon polyps   . Irregular heart beat   . Obesity   . CAD (coronary artery disease), native coronary artery 02/16/2015    Cath with 20% LCx and RCA    Past Surgical History  Procedure Laterality Date  . Tubal ligation    . Uterine fibroid surgery    . Operative hysteroscopy    . Abdominal hysterectomy    . Left heart catheterization with coronary angiogram N/A 02/16/2015    Procedure: LEFT HEART CATHETERIZATION WITH CORONARY ANGIOGRAM;  Surgeon: Kathleene Hazel, MD;  Location: [facility] CATH LAB;  Service: Cardiovascular;  Laterality: N/A;     Current Outpatient Prescriptions  Medication Sig Dispense Refill  . amLODipine (NORVASC) 5 MG tablet Take 5 mg by mouth daily.    . hydrochlorothiazide (MICROZIDE) 12.5 MG capsule TAKE ONE CAPSULE BY MOUTH EVERY DAY 30 capsule 5  . metFORMIN (GLUCOPHAGE-XR) 500 MG 24 hr tablet TAKE 2 TABLETS BY MOUTH 2 TIMES A DAY 180 tablet 2  . metoprolol succinate (TOPROL-XL) 50 MG 24 hr  tablet 1 and 1/2 tablet by mouth daily 45 tablet 5  . potassium chloride SA (KLOR-CON M20) 20 MEQ tablet Take 1 tablet (20 mEq total) by mouth daily. 30 tablet 1  . pravastatin (PRAVACHOL) 80 MG tablet TAKE 1 TABLET EVERY DAY 90 tablet 3  . traMADol (ULTRAM) 50 MG tablet Take 1 tablet (50 mg total) by mouth every 6 (six) hours as needed. 30 tablet 0  . XARELTO 20 MG TABS tablet TAKE 1 TABLET BY MOUTH EVERY DAY WITH SUPPER 30 tablet 6   No current facility-administered medications for this visit.    Allergies:   Flexeril; Lipitor; Lisinopril; and Metaxalone    Social History:  The patient  reports that she quit smoking about 36 years ago. Her smoking use included Cigarettes. She has never used smokeless tobacco. She reports that she does not drink alcohol or use illicit drugs.   Family History:  The patient's family history includes Clotting disorder in her other; Diabetes in her mother; Kidney disease in her mother; Lung cancer in her father; Stomach cancer in her sister. There is no history of Colon cancer.    ROS:  Please see the history of present illness.   Otherwise, review of systems are positive for none.   All  other systems are reviewed and negative.    PHYSICAL EXAM: VS:  BP 138/84 mmHg  Pulse 60  Ht 5\' 3"  (1.6 m)  Wt 115.032 kg (253 lb 9.6 oz)  BMI 44.93 kg/m2 , BMI Body mass index is 44.93 kg/(m^2). GEN: Well nourished, well developed, in no acute distress HEENT: normal Neck: no JVD, carotid bruits, or masses Cardiac: RRR; no murmurs, rubs, or gallops,no edema  Respiratory:  clear to auscultation bilaterally, normal work of breathing GI: soft, nontender, nondistended, + BS MS: no deformity or atrophy Skin: warm and dry, no rash Neuro:  Strength and sensation are intact Psych: euthymic mood, full affect   EKG:  EKG was ordered today and showed NSR with PAC's, LAFB and septal infarct    Recent Labs: 11/25/2014: Pro B Natriuretic peptide (BNP) 670.00*; TSH  3.429 01/20/2015: ALT 11 02/14/2015: Hemoglobin 12.6; Platelets 218.0 04/07/2015: BUN 14; Creatinine, Ser 0.74; Magnesium 1.6; Potassium 4.0; Sodium 139    Lipid Panel    Component Value Date/Time   CHOL 173 11/25/2014 1426   TRIG 135 11/25/2014 1426   HDL 53 11/25/2014 1426   CHOLHDL 3.3 11/25/2014 1426   VLDL 27 11/25/2014 1426   LDLCALC 93 11/25/2014 1426   LDLDIRECT 130* 05/13/2012 0917      Wt Readings from Last 3 Encounters:  10/26/15 115.032 kg (253 lb 9.6 oz)  08/17/15 111.585 kg (246 lb)  06/23/15 109.544 kg (241 lb 8 oz)    ASSESSMENT AND PLAN:  1. PAF and nonsustained atrial tachycardia.Maintaining NSR with PAC's.   Continue BB/Xarelto.  Check NOAC.   2. HTN - BP controlled.  Continue BB/HCTZ/amlodipine.  Check BMET.   3. Nonobstructive ASCAD. She has not had any further chest discomfort.  She is not on ASA due to NOAC.  Continue statin.   4.  Dyslipidemia on statin.  Continue statin.      Current medicines are reviewed at length with the patient today.  The patient does not have concerns regarding medicines.  The following changes have been made:  no change  Labs/ tests ordered today: See above Assessment and Plan No orders of the defined types were placed in this encounter.     Disposition:   FU with me in 6 months  Signed, Quintella ReichertURNER,TRACI R, MD  10/26/2015 8:48 AM    [facility] [address], [place], [place]  [address] Phone: [phone]; Fax: [phone]

## 2015-10-26 NOTE — Patient Instructions (Signed)

## 2015-10-30 ENCOUNTER — Telehealth: Payer: Self-pay

## 2015-10-30 DIAGNOSIS — I471 Supraventricular tachycardia: Secondary | ICD-10-CM

## 2015-10-30 MED ORDER — POTASSIUM CHLORIDE CRYS ER 20 MEQ PO TBCR: 20.0000 meq | EXTENDED_RELEASE_TABLET | Freq: Every day | ORAL | Status: DC

## 2015-10-30 NOTE — Telephone Encounter (Signed)
Patient st she has not taken her Kdur in a few months because her Rx ran out and she never requested more. Patient to RESTART KDUR 20 meq daily. BMET scheduled for Dec 1. Patient agrees with treatment plan.

## 2015-10-30 NOTE — Telephone Encounter (Signed)
-----   Message from Quintella Reichertraci R Turner, MD sent at 10/28/2015  6:37 PM EST ----- Increase Kdur to 20meq BID and recheck BMET in 1 week

## 2015-11-06 ENCOUNTER — Other Ambulatory Visit: Payer: Self-pay

## 2015-11-06 DIAGNOSIS — E7800 Pure hypercholesterolemia, unspecified: Secondary | ICD-10-CM

## 2015-11-09 ENCOUNTER — Other Ambulatory Visit (INDEPENDENT_AMBULATORY_CARE_PROVIDER_SITE_OTHER): Payer: Commercial Managed Care - HMO | Admitting: *Deleted

## 2015-11-09 ENCOUNTER — Telehealth: Payer: Self-pay | Admitting: Student

## 2015-11-09 ENCOUNTER — Encounter: Payer: Self-pay | Admitting: Student

## 2015-11-09 DIAGNOSIS — E7800 Pure hypercholesterolemia, unspecified: Secondary | ICD-10-CM | POA: Diagnosis not present

## 2015-11-09 DIAGNOSIS — I471 Supraventricular tachycardia: Secondary | ICD-10-CM | POA: Diagnosis not present

## 2015-11-09 DIAGNOSIS — I4719 Other supraventricular tachycardia: Secondary | ICD-10-CM

## 2015-11-09 LAB — LIPID PANEL
CHOLESTEROL: 173 mg/dL (ref 125–200)
HDL: 57 mg/dL (ref >=46)
LDL Cholesterol: 101 mg/dL (ref <130)
Total CHOL/HDL Ratio: 3 Ratio (ref <=5.0)
Triglycerides: 77 mg/dL (ref <150)
VLDL: 15 mg/dL (ref <30)

## 2015-11-09 LAB — BASIC METABOLIC PANEL
BUN: 18 mg/dL (ref 7–25)
CALCIUM: 10 mg/dL (ref 8.6–10.4)
CO2: 26 mmol/L (ref 20–31)
CREATININE: 0.68 mg/dL (ref 0.50–0.99)
Chloride: 103 mmol/L (ref 98–110)
GLUCOSE: 121 mg/dL — AB (ref 65–99)
Potassium: 3.6 mmol/L (ref 3.5–5.3)
SODIUM: 137 mmol/L (ref 135–146)

## 2015-11-09 NOTE — Addendum Note (Signed)
Addended by: Tonita PhoenixBOWDEN, ROBIN K on: 11/09/2015 07:48 AM   Modules accepted: Orders

## 2015-11-09 NOTE — Telephone Encounter (Signed)
Called and discussed about her lab results (BMP and lipid panel) from today. She appreciated the call.

## 2015-11-28 ENCOUNTER — Telehealth: Payer: Self-pay | Admitting: Cardiology

## 2015-11-28 ENCOUNTER — Telehealth: Payer: Self-pay

## 2015-11-28 DIAGNOSIS — E7800 Pure hypercholesterolemia, unspecified: Secondary | ICD-10-CM

## 2015-11-28 MED ORDER — EZETIMIBE 10 MG PO TABS: 10.0000 mg | ORAL_TABLET | Freq: Every day | ORAL | Status: DC

## 2015-11-28 NOTE — Telephone Encounter (Signed)
error 

## 2015-11-28 NOTE — Telephone Encounter (Signed)
Zetia 10 mg daily called in to pharmacy of choice. FLP and ALT scheduled for 01/19/16. Patient agrees with treatment plan.

## 2015-11-28 NOTE — Telephone Encounter (Signed)
-----   Message from Quintella Reichertraci R Turner, MD sent at 11/09/2015  3:00 PM EST ----- LDL not at goal - add zetia 10 mg daily and recheck FLP and ALT in 8 weeks

## 2016-01-04 ENCOUNTER — Telehealth: Payer: Self-pay | Admitting: Family Medicine

## 2016-01-04 NOTE — Telephone Encounter (Signed)
Pt returning call, no notes documented as of yet. Meredith [name], ASA

## 2016-01-19 ENCOUNTER — Other Ambulatory Visit (INDEPENDENT_AMBULATORY_CARE_PROVIDER_SITE_OTHER): Payer: Commercial Managed Care - HMO | Admitting: *Deleted

## 2016-01-19 DIAGNOSIS — E7800 Pure hypercholesterolemia, unspecified: Secondary | ICD-10-CM

## 2016-01-19 LAB — ALT: ALT: 9 U/L (ref 6–29)

## 2016-01-19 LAB — LIPID PANEL
CHOL/HDL RATIO: 3.1 ratio (ref <=5.0)
Cholesterol: 176 mg/dL (ref 125–200)
HDL: 56 mg/dL (ref >=46)
LDL Cholesterol: 104 mg/dL (ref <130)
Triglycerides: 80 mg/dL (ref <150)
VLDL: 16 mg/dL (ref <30)

## 2016-01-19 NOTE — Addendum Note (Signed)
Addended by: Tonita Phoenix on: 01/19/2016 08:15 AM   Modules accepted: Orders

## 2016-01-25 ENCOUNTER — Telehealth: Payer: Self-pay

## 2016-01-25 ENCOUNTER — Other Ambulatory Visit: Payer: Self-pay | Admitting: Family Medicine

## 2016-01-25 DIAGNOSIS — E7800 Pure hypercholesterolemia, unspecified: Secondary | ICD-10-CM

## 2016-01-25 MED ORDER — ROSUVASTATIN CALCIUM 20 MG PO TABS: 20.0000 mg | ORAL_TABLET | Freq: Every day | ORAL | Status: DC

## 2016-01-25 NOTE — Telephone Encounter (Signed)
-----   Message from Evie Lacks, [facility] sent at 01/25/2016  1:41 PM EST ----- LDL still above goal of < 70 mg/dL.  She has not tolerated Lipitor in the past.  She was given Crestor but never took due to cost.  Would retry Crestor now that it is generic and should be affordable.   1.  Change pravastatin to Crestor  daily  2.  Continue Zetia  daily 3.  Recheck lipids and liver in 3 months.

## 2016-01-25 NOTE — Telephone Encounter (Signed)
Informed patient of results and verbal understanding expressed.  Instructed patient to STOP PRAVASTATIN and START CRESTOR 20 mg daily. Patient understands to continue Zetia 10 mg daily. Recall placed to recheck lipids and liver in 3 months per patient request. Patient agrees with treatment plan.

## 2016-02-01 ENCOUNTER — Other Ambulatory Visit: Payer: Self-pay | Admitting: Family Medicine

## 2016-02-01 ENCOUNTER — Telehealth: Payer: Self-pay | Admitting: *Deleted

## 2016-02-01 NOTE — Telephone Encounter (Signed)
Called patient to discuss medication refill for Ultram. Patient states she needs more Ultram for some knee pain she is experiencing. Explained to patient I cannot refill medication at this time because I have never seen her for knee pain. Patient was understanding. She will call and make an appointment to discuss her pain.   Anders Simmonds, MD

## 2016-02-01 NOTE — Telephone Encounter (Signed)
Would like to see Miss

## 2016-02-01 NOTE — Telephone Encounter (Signed)
Called patient to offer flu vaccine, however, VM picked up.  Left message on patient's voice mail to return call. DUCATTE, LAURENZE L, RN   

## 2016-02-03 ENCOUNTER — Other Ambulatory Visit: Payer: Self-pay | Admitting: Family Medicine

## 2016-03-01 ENCOUNTER — Ambulatory Visit (INDEPENDENT_AMBULATORY_CARE_PROVIDER_SITE_OTHER): Payer: Commercial Managed Care - HMO | Admitting: Family Medicine

## 2016-03-01 ENCOUNTER — Encounter: Payer: Self-pay | Admitting: Family Medicine

## 2016-03-01 VITALS — BP 140/90 | HR 81 | Temp 97.7°F | Ht 63.0 in | Wt 252.0 lb

## 2016-03-01 DIAGNOSIS — E119 Type 2 diabetes mellitus without complications: Secondary | ICD-10-CM

## 2016-03-01 DIAGNOSIS — I10 Essential (primary) hypertension: Secondary | ICD-10-CM

## 2016-03-01 LAB — POCT GLYCOSYLATED HEMOGLOBIN (HGB A1C): Hemoglobin A1C: 7

## 2016-03-01 MED ORDER — TRAMADOL HCL 50 MG PO TABS: 50.0000 mg | ORAL_TABLET | Freq: Four times a day (QID) | ORAL | Status: DC | PRN

## 2016-03-01 NOTE — Patient Instructions (Addendum)
Thank you for coming in today, it was so nice to see you!  Today we talked about your blood pressure and diabetes. Your blood pressure looks great! I would like to follow up with you in 3 months or sooner if needed. Please make an appointment at the front desk before you leave.   For your knee pain, you can take Tylenol as needed. You can take up to 4 pills of 500mg  Tylenol a day, that will not hurt your liver. If you are in extreme pain, you can take a Tramadol.  If you have any questions or concerns, please do not hesitate to call the office at [phone].  Sincerely,  Anders Simmondshristina Estrella Alcaraz, MD  Diabetes Mellitus and Food It is important for you to manage your blood sugar (glucose) level. Your blood glucose level can be greatly affected by what you eat. Eating healthier foods in the appropriate amounts throughout the day at about the same time each day will help you control your blood glucose level. It can also help slow or prevent worsening of your diabetes mellitus. Healthy eating may even help you improve the level of your blood pressure and reach or maintain a healthy weight.  General recommendations for healthful eating and cooking habits include:  Eating meals and snacks regularly. Avoid going long periods of time without eating to lose weight.  Eating a diet that consists mainly of plant-based foods, such as fruits, vegetables, nuts, legumes, and whole grains.  Using low-heat cooking methods, such as baking, instead of high-heat cooking methods, such as deep frying. Work with your dietitian to make sure you understand how to use the Nutrition Facts information on food labels. HOW CAN FOOD AFFECT ME? Carbohydrates Carbohydrates affect your blood glucose level more than any other type of food. Your dietitian will help you determine how many carbohydrates to eat at each meal and teach you how to count carbohydrates. Counting carbohydrates is important to keep your blood glucose at a  healthy level, especially if you are using insulin or taking certain medicines for diabetes mellitus. Alcohol Alcohol can cause sudden decreases in blood glucose (hypoglycemia), especially if you use insulin or take certain medicines for diabetes mellitus. Hypoglycemia can be a life-threatening condition. Symptoms of hypoglycemia (sleepiness, dizziness, and disorientation) are similar to symptoms of having too much alcohol.  If your health care provider has given you approval to drink alcohol, do so in moderation and use the following guidelines:  Women should not have more than one drink per day, and men should not have more than two drinks per day. One drink is equal to:  12 oz of beer.  5 oz of wine.  1 oz of hard liquor.  Do not drink on an empty stomach.  Keep yourself hydrated. Have water, diet soda, or unsweetened iced tea.  Regular soda, juice, and other mixers might contain a lot of carbohydrates and should be counted. WHAT FOODS ARE NOT RECOMMENDED? As you make food choices, it is important to remember that all foods are not the same. Some foods have fewer nutrients per serving than other foods, even though they might have the same number of calories or carbohydrates. It is difficult to get your body what it needs when you eat foods with fewer nutrients. Examples of foods that you should avoid that are high in calories and carbohydrates but low in nutrients include:  Trans fats (most processed foods list trans fats on the Nutrition Facts label).  Regular soda.  Juice.  Candy.  Sweets, such as cake, pie, doughnuts, and cookies.  Fried foods. WHAT FOODS CAN I EAT? Eat nutrient-rich foods, which will nourish your body and keep you healthy. The food you should eat also will depend on several factors, including:  The calories you need.  The medicines you take.  Your weight.  Your blood glucose level.  Your blood pressure level.  Your cholesterol level. You should  eat a variety of foods, including:  Protein.  Lean cuts of meat.  Proteins low in saturated fats, such as fish, egg whites, and beans. Avoid processed meats.  Fruits and vegetables.  Fruits and vegetables that may help control blood glucose levels, such as apples, mangoes, and yams.  Dairy products.  Choose fat-free or low-fat dairy products, such as milk, yogurt, and cheese.  Grains, bread, pasta, and rice.  Choose whole grain products, such as multigrain bread, whole oats, and brown rice. These foods may help control blood pressure.  Fats.  Foods containing healthful fats, such as nuts, avocado, olive oil, canola oil, and fish. DOES EVERYONE WITH DIABETES MELLITUS HAVE THE SAME MEAL PLAN? Because every person with diabetes mellitus is different, there is not one meal plan that works for everyone. It is very important that you meet with a dietitian who will help you create a meal plan that is just right for you.   This information is not intended to replace advice given to you by your health care provider. Make sure you discuss any questions you have with your health care provider.   Document Released: 08/22/2005 Document Revised: 12/16/2014 Document Reviewed: 10/22/2013 Elsevier Interactive Patient Education [employer]

## 2016-03-01 NOTE — Progress Notes (Signed)
Subjective:    Patient ID: Meredith MateBetty K President , female   DOB: [birth year] , 65 y.o..   MRN: [id number]  HPI  Meredith [name] is here for follow up.  Knee Pain: B/l knee pain worse with ambulation, has been presents for 3-4 years.  Has tried mobic and tramadol in the past, she states that mobic worked but raised her blood pressure. Tramadol did help but makes her sleepy at work. She will only use Tramadol sparingly if she is in extreme pain. Has not tried tylenol because her other doctor told her she could not take it because it can harm her liver. Has had knee injections in the past and did not like them. Does not want physical therapy.   Hypertension:  Taking medications as instructed, no medication side effects noted, no TIA's, no chest pain on exertion, no dyspnea on exertion and no swelling of ankles.  Denies chest pain, palpitations, SOB, blurry vision. Admits to some ankles edema.   Diabetes Mellitus:  Medication compliance: compliant most of the time, diabetic diet compliance: noncompliant some of the time, further diabetic ROS: no polyuria or polydipsia, no chest pain, dyspnea or TIA's, no unusual visual symptoms, no hypoglycemia.  Other symptoms and concerns: Admits to some hand pain/tingling but no foot numbness/tingling.   Review of Systems: Per HPI. All other systems reviewed and are negative.  Medications: reviewed and updated in Epic  Social Hx:   reports that she quit smoking about 37 years ago. Her smoking use included Cigarettes. She has never used smokeless tobacco.    Objective:   BP 140/90 mmHg  Pulse 81  Temp(Src) 97.7 F (36.5 C) (Oral)  Ht 5\' 3"  (1.6 m)  Wt 252 lb (114.306 kg)  BMI 44.65 kg/m2  SpO2 92% Physical Exam  Constitutional: She appears well-developed and well-nourished.  Obese  Cardiovascular: Normal rate, normal heart sounds and intact distal pulses.  An irregular rhythm present.  No murmur heard. Pulmonary/Chest: Effort normal and breath sounds  normal.  Musculoskeletal: Normal range of motion. She exhibits no edema or tenderness.       Right knee: She exhibits normal range of motion, no swelling and no deformity.       Left knee: She exhibits normal range of motion, no swelling and no deformity.      Diabetic Foot Exam - Simple   Simple Foot Form  Diabetic Foot exam was performed with the following findings:  Yes 03/01/2016  1:45 PM  Visual Inspection  No deformities, no ulcerations, no other skin breakdown bilaterally:  Yes  Sensation Testing  Intact to touch and monofilament testing bilaterally:  Yes  Pulse Check  Comments      Assessment & Plan:  HYPERTENSION, BENIGN SYSTEMIC Controlled. BP 140/90 today.  - Continue Metoprolol, HCTZ, and Amlodipine   Diabetes mellitus type II, controlled, with no complications Uncontrolled. A1C increased from 6.4 to 7 today. Normal diabetic foot exam today. Patient upset that her A1C increased and would like to try decreasing carbohydrates from diet.  - Patient will try a low carbohydrate diet and increase walking - Continue Metformin 2000 mg daily - Re-check A1C in 3 months - If A1C continues to be elevated at next check, may consider adding a sulfonylurea agent  OSTEOARTHRITIS, MULTI SITES Pain uncontrolled. Known bilateral end stage osteoarthritis in knees. Does not interested in  joint injections or physical therapy at this time.  - Will avoid NSAIDS due to cardiac history - For pain patient will  try Tylenol 500 mg TID and Tramadol 50 mg PRN (30 pills prescribed) - Follow up in 4-6 weeks

## 2016-03-02 ENCOUNTER — Other Ambulatory Visit: Payer: Self-pay | Admitting: Family Medicine

## 2016-03-03 NOTE — Assessment & Plan Note (Signed)
Pain uncontrolled. Known bilateral end stage osteoarthritis in knees. Does not interested in  joint injections or physical therapy at this time.  - Will avoid NSAIDS due to cardiac history - For pain patient will try Tylenol 500 mg TID and Tramadol 50 mg PRN (30 pills prescribed) - Follow up in 4-6 weeks

## 2016-03-03 NOTE — Assessment & Plan Note (Signed)
Controlled. BP 140/90 today.  - Continue Metoprolol, HCTZ, and Amlodipine

## 2016-03-03 NOTE — Assessment & Plan Note (Addendum)
Uncontrolled. A1C increased from 6.4 to 7 today. Normal diabetic foot exam today. Patient upset that her A1C increased and would like to try decreasing carbohydrates from diet.  - Patient will try a low carbohydrate diet and increase walking - Continue Metformin 2000 mg daily - Re-check A1C in 3 months - If A1C continues to be elevated at next check, may consider adding a sulfonylurea agent

## 2016-03-28 ENCOUNTER — Other Ambulatory Visit: Payer: Self-pay | Admitting: Nurse Practitioner

## 2016-03-29 ENCOUNTER — Telehealth: Payer: Self-pay | Admitting: Family Medicine

## 2016-03-29 DIAGNOSIS — E2839 Other primary ovarian failure: Secondary | ICD-10-CM

## 2016-03-29 NOTE — Telephone Encounter (Signed)
Pt called and was told by her insurance company that she can have bone density test. She would like us to order this and let her know when the appointment is. Myriam Jacobsonjw

## 2016-03-29 NOTE — Telephone Encounter (Signed)
Order for DEXA scan placed. Attempted to call patient to inform her but no answer and no option for voicemail. Thank you.

## 2016-04-05 ENCOUNTER — Encounter: Payer: Self-pay | Admitting: Family Medicine

## 2016-04-05 ENCOUNTER — Ambulatory Visit (INDEPENDENT_AMBULATORY_CARE_PROVIDER_SITE_OTHER): Payer: Commercial Managed Care - HMO | Admitting: Family Medicine

## 2016-04-05 VITALS — BP 163/73 | HR 74 | Temp 98.2°F | Ht 63.0 in | Wt 252.4 lb

## 2016-04-05 DIAGNOSIS — R208 Other disturbances of skin sensation: Secondary | ICD-10-CM | POA: Diagnosis not present

## 2016-04-05 DIAGNOSIS — E119 Type 2 diabetes mellitus without complications: Secondary | ICD-10-CM

## 2016-04-05 DIAGNOSIS — R200 Anesthesia of skin: Secondary | ICD-10-CM

## 2016-04-05 NOTE — Patient Instructions (Signed)
Thank you for coming in today, it was so nice to see you!  Today we talked about your right hand numbness and weakness. I will make a referral to neurology and physical therapy for you. You will receive a call to schedule those appointments.   I'd like to see you back in June. Please make an appointment at the front desk.  before you leave.   If you have any questions or concerns, please do not hesitate to call the office at [phone].  Sincerely,  Anders Simmondshristina Gambino, MD

## 2016-04-06 DIAGNOSIS — R200 Anesthesia of skin: Secondary | ICD-10-CM | POA: Insufficient documentation

## 2016-04-06 NOTE — Progress Notes (Signed)
Subjective:    Patient ID: Meredith [name] , female   DOB: [birth year] , 65 y.o..   MRN: [id number]  HPI  Meredith [name] is here for a same day visit for right hand numbness.  Right hand numbness:  - Began many months ago after a cardiac procedure patient had where they "went in through my arm" (cannot remember exact date, name of procedure, or what it was for).   - Numbness is intermittent - Worsens with laying on right arm, no alleviating factors identified.  - Has tried "shaking" her hand to try and get feeling back - Denies "pins and needles" feeling or any trauma to the area - Admits to right hand weakness. At times she she will be writing and drop the pen. - Denies any other area of weakness in the body  Review of Systems: Per HPI. All other systems reviewed and are negative.  Past Medical History: Patient Active Problem List   Diagnosis Date Noted  . Estrogen deficiency 03/29/2016  . CAD (coronary artery disease), native coronary artery 10/26/2015  . Back pain 08/17/2015  . Atrial fibrillation (HCC) 02/23/2015  . PAC (premature atrial contraction) 01/19/2015  . Atrial tachycardia, paroxysmal (HCC) 01/19/2015  . Irregular heart rhythm 11/25/2014  . ASTHMA, INTERMITTENT 09/07/2010  . HERNIA, VENTRAL 04/18/2010  . 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 Diabetes mellitus type II, controlled, with no complications (HCC) 02/05/2007  . HYPERCHOLESTEROLEMIA 02/05/2007  . Obesity 02/05/2007  . DEPRESSION, MAJOR, RECURRENT 02/05/2007  . HYPERTENSION, BENIGN SYSTEMIC 02/05/2007  . GASTROESOPHAGEAL REFLUX, NO ESOPHAGITIS 02/05/2007  . OSTEOARTHRITIS, MULTI SITES 02/05/2007    Medications: reviewed and updated Current Outpatient Prescriptions  Medication Sig Dispense Refill  . amLODipine (NORVASC) 5 MG tablet Take 5 mg by mouth daily.    Marland Kitchen. ezetimibe (ZETIA) 10 MG tablet Take 1 tablet (10 mg total) by mouth daily. 30 tablet 11  . hydrochlorothiazide (MICROZIDE)  12.5 MG capsule TAKE ONE CAPSULE BY MOUTH EVERY DAY 30 capsule 5  . metFORMIN (GLUCOPHAGE-XR) 500 MG 24 hr tablet TAKE 2 TABLETS BY MOUTH TWICE A DAY 180 tablet 3  . metoprolol succinate (TOPROL-XL) 50 MG 24 hr tablet TAKE 1 AND 1/2 TABLET BY MOUTH DAILY 45 tablet 5  . potassium chloride SA (KLOR-CON M20) 20 MEQ tablet Take 1 tablet (20 mEq total) by mouth daily. 30 tablet 11  . rosuvastatin (CRESTOR) 20 MG tablet Take 1 tablet (20 mg total) by mouth daily. 30 tablet 11  . traMADol (ULTRAM) 50 MG tablet Take 1 tablet (50 mg total) by mouth every 6 (six) hours as needed. 30 tablet 0  . XARELTO 20 MG TABS tablet TAKE 1 TABLET BY MOUTH EVERY DAY WITH SUPPER 30 tablet 6   No current facility-administered medications for this visit.    Social Hx:  reports that she quit smoking about 37 years ago. Her smoking use included Cigarettes. She has never used smokeless tobacco.    Objective:   BP 163/73 mmHg  Pulse 74  Temp(Src) 98.2 F (36.8 C) (Oral)  Ht 5\' 3"  (1.6 m)  Wt 252 lb 6.4 oz (114.488 kg)  BMI 44.72 kg/m2  SpO2 97% Physical Exam  Gen: NAD, alert, cooperative with exam, well-appearing, obese Cardiac: Irregular rhythm, no edema, capillary refill brisk  Respiratory: Clear to auscultation bilaterally, no wheezes, non-labored  Skin: no rashes, normal turgor  MSK: Negative phalen's test and tinnel sign Neurological:  Mental Status: alert; oriented to person, place and year; knowledge  is normal for age; language is normal Cranial Nerves: CN 2-12 intact Motor: Normal strength, tone and mass throughout except 4/5 strength in right hand when testing grip Sensory: intact throughout to light touch Coordination: good finger-to-nose Gait: antalgic gait (likely from b/l knee arthritis) Reflexes: no clonus  Psych: good insight, normal mood and affect   Assessment & Plan:  Numbness of hand Right hand numbness for "many months" following a possible cardiac procedure (no notes about procedure  seen in Epic). Neurological exam within normal limits except for decreased grip strength in right hand. Unlikely thromboembolic event as symptoms are chronic and rest of neurological exam is within normal limits. Differentials include carpal tunnel syndrome or possible neurological damage from T2DM or unknown trauma. Discussed case with Dr. Lum Babe.  - Will refer to neurology for nerve stimulation testing - Will refer to physical therapy - Continue to follow up as needed

## 2016-04-06 NOTE — Assessment & Plan Note (Addendum)
Right hand numbness for "many months" following a possible cardiac procedure (no notes about procedure seen in Epic). Neurological exam within normal limits except for decreased grip strength in right hand. Unlikely thromboembolic event as symptoms are chronic and rest of neurological exam is within normal limits. Differentials include carpal tunnel syndrome or possible neurological damage from T2DM or unknown trauma. Discussed case with Dr. Lum BabeEniola.  - Will refer to neurology for nerve stimulation testing - Will refer to physical therapy - Continue to follow up as needed

## 2016-04-12 NOTE — Addendum Note (Signed)
Addended by: Beaulah DinningGAMBINO, CHRISTINA M on: 04/12/2016 01:10 AM   Modules accepted: Orders

## 2016-05-05 ENCOUNTER — Other Ambulatory Visit: Payer: Self-pay | Admitting: Family Medicine

## 2016-05-10 ENCOUNTER — Ambulatory Visit (INDEPENDENT_AMBULATORY_CARE_PROVIDER_SITE_OTHER): Payer: Commercial Managed Care - HMO | Admitting: Cardiology

## 2016-05-10 ENCOUNTER — Encounter: Payer: Self-pay | Admitting: Cardiology

## 2016-05-10 ENCOUNTER — Other Ambulatory Visit (INDEPENDENT_AMBULATORY_CARE_PROVIDER_SITE_OTHER): Payer: Commercial Managed Care - HMO | Admitting: *Deleted

## 2016-05-10 VITALS — BP 150/88 | HR 79 | Ht 63.0 in | Wt 250.4 lb

## 2016-05-10 DIAGNOSIS — I10 Essential (primary) hypertension: Secondary | ICD-10-CM

## 2016-05-10 DIAGNOSIS — I2510 Atherosclerotic heart disease of native coronary artery without angina pectoris: Secondary | ICD-10-CM | POA: Diagnosis not present

## 2016-05-10 DIAGNOSIS — E7800 Pure hypercholesterolemia, unspecified: Secondary | ICD-10-CM

## 2016-05-10 DIAGNOSIS — I480 Paroxysmal atrial fibrillation: Secondary | ICD-10-CM

## 2016-05-10 LAB — HEPATIC FUNCTION PANEL
ALBUMIN: 4 g/dL (ref 3.6–5.1)
ALT: 9 U/L (ref 6–29)
AST: 12 U/L (ref 10–35)
Alkaline Phosphatase: 45 U/L (ref 33–130)
Bilirubin, Direct: 0.1 mg/dL (ref <=0.2)
Indirect Bilirubin: 0.3 mg/dL (ref 0.2–1.2)
TOTAL PROTEIN: 7 g/dL (ref 6.1–8.1)
Total Bilirubin: 0.4 mg/dL (ref 0.2–1.2)

## 2016-05-10 LAB — LIPID PANEL
Cholesterol: 137 mg/dL (ref 125–200)
HDL: 58 mg/dL (ref >=46)
LDL Cholesterol: 63 mg/dL (ref <130)
TRIGLYCERIDES: 81 mg/dL (ref <150)
Total CHOL/HDL Ratio: 2.4 Ratio (ref <=5.0)
VLDL: 16 mg/dL (ref <30)

## 2016-05-10 NOTE — Patient Instructions (Signed)

## 2016-05-10 NOTE — Addendum Note (Signed)
Addended by: Tonita PhoenixBOWDEN, Yamile Roedl K on: 05/10/2016 08:06 AM   Modules accepted: Orders

## 2016-05-10 NOTE — Progress Notes (Signed)
Cardiology Office Note    Date:  05/10/2016   ID:  Meredith MateBetty K Belgard, DOB [birth year], MRN [id number]  PCP:  Beaulah Dinninghristina M Gambino, MD  Cardiologist:  Armanda Magicraci Roselene Gray, MD   Chief Complaint  Patient presents with  . Coronary Artery Disease  . Hypertension  . Atrial Fibrillation    History of Present Illness:  Meredith [name] is a 65 y.o. female who presents for followup of atrial tachycardia, PAF on Xarelto, nonobstructive ASCAD with 20% LCx and RCA by cath, HTN and dyslipidemia. She presents back today for followup. She denies chest pressure, SOB, DOE, LE edema, palpitations, dizziness or syncope. She denies any claudication symptoms but does have joint pain.    Past Medical History  Diagnosis Date  . Diabetes mellitus   . Hypertension   . Hyperlipidemia   . Osteoarthritis   . ANEMIA, IRON DEFICIENCY, UNSPEC. 02/05/2007    Qualifier: Diagnosis of  By: Para Marchuncan MD, Cheree DittoGraham    . Ventral hernia   . Enteritis   . DJD (degenerative joint disease) of lumbar spine   . Diverticulosis 05/17/12  . Renal cyst, left 05/17/12  . Gastric ulcer 04/2000  . Colon polyps   . Irregular heart beat   . Obesity   . CAD (coronary artery disease), native coronary artery 02/16/2015    Cath with 20% LCx and RCA    Past Surgical History  Procedure Laterality Date  . Tubal ligation    . Uterine fibroid surgery    . Operative hysteroscopy    . Abdominal hysterectomy    . Left heart catheterization with coronary angiogram N/A 02/16/2015    Procedure: LEFT HEART CATHETERIZATION WITH CORONARY ANGIOGRAM;  Surgeon: Kathleene Hazelhristopher D McAlhany, MD;  [facility];  Service: Cardiovascular;  Laterality: N/A;    Current Medications: Outpatient Prescriptions Prior to Visit  Medication Sig Dispense Refill  . amLODipine (NORVASC) 5 MG tablet TAKE 1 TABLET BY MOUTH EVERY DAY 90 tablet 3  . ezetimibe (ZETIA) 10 MG tablet Take 1 tablet (10 mg total) by mouth daily. 30 tablet 11  . hydrochlorothiazide (MICROZIDE)  12.5 MG capsule TAKE ONE CAPSULE BY MOUTH EVERY DAY 30 capsule 5  . metFORMIN (GLUCOPHAGE-XR) 500 MG 24 hr tablet TAKE 2 TABLETS BY MOUTH TWICE A DAY 180 tablet 3  . metoprolol succinate (TOPROL-XL) 50 MG 24 hr tablet TAKE 1 AND 1/2 TABLET BY MOUTH DAILY 45 tablet 5  . potassium chloride SA (KLOR-CON M20) 20 MEQ tablet Take 1 tablet (20 mEq total) by mouth daily. 30 tablet 11  . rosuvastatin (CRESTOR) 20 MG tablet Take 1 tablet (20 mg total) by mouth daily. 30 tablet 11  . traMADol (ULTRAM) 50 MG tablet Take 1 tablet (50 mg total) by mouth every 6 (six) hours as needed. 30 tablet 0  . XARELTO 20 MG TABS tablet TAKE 1 TABLET BY MOUTH EVERY DAY WITH SUPPER 30 tablet 6   No facility-administered medications prior to visit.     Allergies:   Flexeril; Lipitor; Lisinopril; and Metaxalone   Social History   Social History  . Marital Status: Divorced    Spouse Name: N/A  . Number of Children: 4  . Years of Education: N/A   Occupational History  . [employer]    Social History Main Topics  . Smoking status: Former Smoker    Types: Cigarettes    Quit date: 12/09/1978  . Smokeless tobacco: Never Used  . Alcohol Use: No  . Drug Use: No  .  Sexual Activity: Not Currently   Other Topics Concern  . None   Social History Narrative     Family History:  The patient's family history includes Clotting disorder in her other; Diabetes in her mother; Kidney disease in her mother; Lung cancer in her father; Stomach cancer in her sister. There is no history of Colon cancer.   ROS:   Please see the history of present illness.    Review of Systems  Musculoskeletal: Positive for muscle cramps.  Neurological: Positive for loss of balance.   All other systems reviewed and are negative.   PHYSICAL EXAM:   VS:  BP 150/88 mmHg  Pulse 79  Ht  (1.6 m)  Wt 250 lb 6.4 oz (113.581 kg)  BMI 44.37 kg/m2  SpO2 98%   GEN: Well nourished, well developed, in no acute distress HEENT: normal Neck: no JVD,  carotid bruits, or masses Cardiac: RRR; no murmurs, rubs, or gallops,no edema.  Intact distal pulses bilaterally.  Respiratory:  clear to auscultation bilaterally, normal work of breathing GI: soft, nontender, nondistended, + BS MS: no deformity or atrophy Skin: warm and dry, no rash Neuro:  Alert and Oriented x 3, Strength and sensation are intact Psych: euthymic mood, full affect  Wt Readings from Last 3 Encounters:  05/10/16 250 lb 6.4 oz (113.581 kg)  04/05/16 252 lb 6.4 oz (114.488 kg)  03/01/16 252 lb (114.306 kg)      Studies/Labs Reviewed:   EKG:  EKG is was ordered today NSR with PACs and septal infarct  Recent Labs: 10/26/2015: Hemoglobin 12.3; Platelets 229 11/09/2015: BUN 18; Creat 0.68; Potassium 3.6; Sodium 137 01/19/2016: ALT 9   Lipid Panel    Component Value Date/Time   CHOL 176 01/19/2016 0816   TRIG 80 01/19/2016 0816   HDL 56 01/19/2016 0816   CHOLHDL 3.1 01/19/2016 0816   VLDL 16 01/19/2016 0816   LDLCALC 104 01/19/2016 0816   LDLDIRECT 130* 05/13/2012 0917    Additional studies/ records that were reviewed today include:  none    ASSESSMENT:    1. Paroxysmal atrial fibrillation (HCC)   2. Coronary artery disease involving native coronary artery of native heart without angina pectoris   3. HYPERTENSION, BENIGN SYSTEMIC   4. HYPERCHOLESTEROLEMIA      PLAN:  In order of problems listed above:  1. PAF - maintaining NSR. Continue BB and NOAC. Check NOAC panel. 2. ASCAD - nonobstructive- with no angina.  Not on ASA due to NOAC.  Continue statin and BB. 3. HTN - BP borderline controlled on current medical regimen.  Continue amlodipine/diuretic/BB. 4. Hyperlipidemia - LDL goal < 70.  Her last LDL was not at goal and she was changed to Crestor.  Continue statin/Zetia.  Check FLP and ALT.      Medication Adjustments/Labs and Tests Ordered: Current medicines are reviewed at length with the patient today.  Concerns regarding medicines are outlined  above.  Medication changes, Labs and Tests ordered today are listed in the Patient Instructions below.  Patient Instructions  Medication Instructions:  Your physician recommends that you continue on your current medications as directed. Please refer to the Current Medication list given to you today.   Labwork: Your physician recommends that you return for FASTING lab work.   Testing/Procedures: None  Follow-Up: Your physician wants you to follow-up in: 6 months with Dr. Mayford Knife. You will receive a reminder letter in the mail two months in advance. If you don't receive a letter, please call  our office to schedule the follow-up appointment.   Any Other Special Instructions Will Be Listed Below (If Applicable).     If you need a refill on your cardiac medications before your next appointment, please call your pharmacy.       Signed, Armanda Magic, MD  05/10/2016 9:40 AM    [facility] [address] [place], [place], [place]  [address] Phone: [phone]; Fax: [phone]

## 2016-05-16 ENCOUNTER — Other Ambulatory Visit (INDEPENDENT_AMBULATORY_CARE_PROVIDER_SITE_OTHER): Payer: Commercial Managed Care - HMO | Admitting: *Deleted

## 2016-05-16 DIAGNOSIS — I2510 Atherosclerotic heart disease of native coronary artery without angina pectoris: Secondary | ICD-10-CM

## 2016-05-16 DIAGNOSIS — E7800 Pure hypercholesterolemia, unspecified: Secondary | ICD-10-CM

## 2016-05-16 DIAGNOSIS — I480 Paroxysmal atrial fibrillation: Secondary | ICD-10-CM | POA: Diagnosis not present

## 2016-05-16 DIAGNOSIS — I4891 Unspecified atrial fibrillation: Secondary | ICD-10-CM | POA: Diagnosis not present

## 2016-05-16 LAB — HEPATIC FUNCTION PANEL
ALBUMIN: 3.9 g/dL (ref 3.6–5.1)
ALT: 9 U/L (ref 6–29)
AST: 13 U/L (ref 10–35)
Alkaline Phosphatase: 42 U/L (ref 33–130)
BILIRUBIN DIRECT: 0.1 mg/dL (ref <=0.2)
BILIRUBIN TOTAL: 0.4 mg/dL (ref 0.2–1.2)
Indirect Bilirubin: 0.3 mg/dL (ref 0.2–1.2)
Total Protein: 7.2 g/dL (ref 6.1–8.1)

## 2016-05-16 LAB — CBC WITH DIFFERENTIAL/PLATELET
Basophils Absolute: 0 cells/uL (ref 0–200)
Basophils Relative: 0 %
EOS PCT: 2 %
Eosinophils Absolute: 142 cells/uL (ref 15–500)
HCT: 36.8 % (ref 35.0–45.0)
HEMOGLOBIN: 12.5 g/dL (ref 11.7–15.5)
LYMPHS ABS: 2272 {cells}/uL (ref 850–3900)
LYMPHS PCT: 32 %
MCH: 26.1 pg — AB (ref 27.0–33.0)
MCHC: 34 g/dL (ref 32.0–36.0)
MCV: 76.8 fL — ABNORMAL LOW (ref 80.0–100.0)
MONOS PCT: 5 %
MPV: 10.2 fL (ref 7.5–12.5)
Monocytes Absolute: 355 cells/uL (ref 200–950)
NEUTROS PCT: 61 %
Neutro Abs: 4331 cells/uL (ref 1500–7800)
Platelets: 205 10*3/uL (ref 140–400)
RBC: 4.79 MIL/uL (ref 3.80–5.10)
RDW: 14.9 % (ref 11.0–15.0)
WBC: 7.1 10*3/uL (ref 3.8–10.8)

## 2016-05-16 LAB — LIPID PANEL
CHOL/HDL RATIO: 2.3 ratio (ref <=5.0)
CHOLESTEROL: 131 mg/dL (ref 125–200)
HDL: 58 mg/dL (ref >=46)
LDL Cholesterol: 56 mg/dL (ref <130)
TRIGLYCERIDES: 84 mg/dL (ref <150)
VLDL: 17 mg/dL (ref <30)

## 2016-05-16 LAB — BASIC METABOLIC PANEL
BUN: 19 mg/dL (ref 7–25)
CHLORIDE: 104 mmol/L (ref 98–110)
CO2: 25 mmol/L (ref 20–31)
CREATININE: 0.77 mg/dL (ref 0.50–0.99)
Calcium: 9.7 mg/dL (ref 8.6–10.4)
Glucose, Bld: 132 mg/dL — ABNORMAL HIGH (ref 65–99)
POTASSIUM: 3.9 mmol/L (ref 3.5–5.3)
Sodium: 141 mmol/L (ref 135–146)

## 2016-05-30 ENCOUNTER — Ambulatory Visit: Payer: Commercial Managed Care - HMO | Attending: Radiology | Admitting: Occupational Therapy

## 2016-05-30 ENCOUNTER — Encounter: Payer: Self-pay | Admitting: Occupational Therapy

## 2016-05-30 DIAGNOSIS — M79641 Pain in right hand: Secondary | ICD-10-CM | POA: Insufficient documentation

## 2016-05-30 DIAGNOSIS — M6281 Muscle weakness (generalized): Secondary | ICD-10-CM | POA: Insufficient documentation

## 2016-05-30 DIAGNOSIS — R208 Other disturbances of skin sensation: Secondary | ICD-10-CM | POA: Diagnosis not present

## 2016-05-30 NOTE — Therapy (Signed)
[facility] [address] [place], [place], [address] Phone: [phone]   Fax:  [phone]  Occupational Therapy Evaluation  Patient Details  Name: Meredith [name] MRN: [id number] Date of Birth: 10-Jun-1951 Referring Provider: Dr. Anders Simmondshristina Gambino  Encounter Date: 05/30/2016      OT End of Session - 05/30/16 1642    Visit Number 1   Number of Visits 5   Date for OT Re-Evaluation 06/29/16   Authorization Type [employer] MCR - G code needed, MCD secondary   Authorization Time Period No authorization or visit limit required   OT Start Time 1530   OT Stop Time 1615   OT Time Calculation (min) 45 min   Activity Tolerance Patient tolerated treatment well      Past Medical History  Diagnosis Date  . Diabetes mellitus   . Hypertension   . Hyperlipidemia   . Osteoarthritis   . ANEMIA, IRON DEFICIENCY, UNSPEC. 02/05/2007    Qualifier: Diagnosis of  By: Para Marchuncan MD, Cheree DittoGraham    . Ventral hernia   . Enteritis   . DJD (degenerative joint disease) of lumbar spine   . Diverticulosis 05/17/12  . Renal cyst, left 05/17/12  . Gastric ulcer 04/2000  . Colon polyps   . Irregular heart beat   . Obesity   . CAD (coronary artery disease), native coronary artery 02/16/2015    Cath with 20% LCx and RCA    Past Surgical History  Procedure Laterality Date  . Tubal ligation    . Uterine fibroid surgery    . Operative hysteroscopy    . Abdominal hysterectomy    . Left heart catheterization with coronary angiogram N/A 02/16/2015    Procedure: LEFT HEART CATHETERIZATION WITH CORONARY ANGIOGRAM;  Surgeon: Kathleene Hazelhristopher D McAlhany, MD;  [facility];  Service: Cardiovascular;  Laterality: N/A;    There were no vitals filed for this visit.      Subjective Assessment - 05/30/16 1539    Subjective  My hand has been numb ever since they put the catheter in my wrist   Pertinent History DM, A-fib   Patient Stated Goals to get strength  and pain relief in my Rt hand   Currently in Pain? Yes   Pain Score 5   up to 8/10 sometimes   Pain Location Hand   Pain Orientation Right   Pain Descriptors / Indicators Numbness;Throbbing   Pain Type Neuropathic pain   Pain Onset More than a month ago   Pain Frequency Intermittent   Aggravating Factors  cold weather   Pain Relieving Factors movement           [facility] OT Assessment - 05/30/16 0001    Assessment   Diagnosis numbness of Rt hand   Referring Provider Dr. Anders Simmondshristina Gambino   Onset Date 02/16/15   Prior Therapy none   Precautions   Precautions Fall   Restrictions   Weight Bearing Restrictions No   Balance Screen   Has the patient fallen in the past 6 months No   Has the patient had a decrease in activity level because of a fear of falling?  Yes   Is the patient reluctant to leave their home because of a fear of falling?  No   Home  Environment   Bathroom Shower/Tub Tub/Shower [employer];Curtain   Home Equipment Cane - single point   Additional Comments Pt lives in 1 story home with 5 steps to enter with railing on Rt going up  Lives With Alone   Prior Function   Level of Independence Independent;Independent with community mobility with device   Vocation Part time employment   Investment banker, corporate at [employer]   ADL   ADL comments Independent with BADLS with slip on shoes and reports dropping smaller items from Rt hand. Pt performs light cooking/meal prep, and all household work. Pt independent with grocery shopping, medication and financial management    Mobility   Mobility Status Independent  with cane   Written Expression   Dominant Hand Right   Handwriting 75% legible;90% legible  fluctuates, noted mild resting tremor   Vision - History   Baseline Vision Wears glasses only for reading   Visual History Cataracts   Sensation   Light Touch Appears Intact   Stereognosis Appears Intact  id 6/7 items   Additional Comments however localizing  stimulus on ring and long finger not accurate   Coordination   9 Hole Peg Test Right;Left   Right 9 Hole Peg Test 23.47 sec.    Left 9 Hole Peg Test 26.38 sec.    Edema   Edema none   ROM / Strength   AROM / PROM / Strength AROM   AROM   Overall AROM Comments BUE AROM WNL's except slight tightness and pain with full composite flexion Rt hand   Hand Function   Right Hand Grip (lbs) 30   Right Hand Lateral Pinch 15 lbs   Right Hand 3 Point Pinch 15 lbs   Left Hand Grip (lbs) 40   Left Hand Lateral Pinch 16 lbs   Left 3 point pinch 15 lbs                              OT Long Term Goals - 05/30/16 1648    OT LONG TERM GOAL #1   Title Pt independent with HEP for Rt hand (all LTG's due 06/29/16)    Time 4   Period Weeks   Status New   OT LONG TERM GOAL #2   Title Pt to improve grip strength by 5 # or > Rt hand    Baseline eval = 30 lbs (Lt = 40)   Time 4   Period Weeks   Status New   OT LONG TERM GOAL #3   Title Pt to report increased legibility with handwriting   Time 4   Period Weeks   Status New   OT LONG TERM GOAL #4   Title Pt to id pain management strategies, desensitization techniques, and safety techniques d/t altered sensation Rt hand   Time 4   Period Weeks   Status New   OT LONG TERM GOAL #5   Title Pt to report overall decreased drops Rt hand throughout day   Time 4   Period Weeks   Status New               Plan - 05/30/16 1644    Clinical Impression Statement Pt is a 65 y.o. female who presents to outpatient rehab with dominant Rt hand numbness, pain, and decreased strength following heart catherization on 02/16/15 in which a catheter was inserted via Rt wrist to look at the heart. Pt reports all symptoms began at this time. Pt also with other extensive PMH (see EPIC for details)    OT Frequency 1x / week   OT Duration 4 weeks  plus evaluation   OT Treatment/Interventions Self-care/ADL  training;Therapeutic  exercise;Patient/family education;Manual Therapy;DME and/or AE instruction;Parrafin;Therapeutic activities;Fluidtherapy;Moist Heat;Passive range of motion   Plan Putty HEP, fluidotherapy, pain management strategies (heat, glove during winter, desensitization)    Consulted and Agree with Plan of Care Patient      Patient will benefit from skilled therapeutic intervention in order to improve the following deficits and impairments:  Impaired sensation, Impaired UE functional use, Pain, Decreased strength  Visit Diagnosis: Pain in right hand  Muscle weakness (generalized)  Other disturbances of skin sensation      G-Codes - 05/30/16 1650    Functional Assessment Tool Used Rt hand grip = 30 lbs, pt subjective report of dropping items frequently   Functional Limitation Carrying, moving and handling objects   Carrying, Moving and Handling Objects Current Status ([id number]) At least 20 percent but less than 40 percent impaired, limited or restricted   Carrying, Moving and Handling Objects Goal Status (U0454(G8985) At least 1 percent but less than 20 percent impaired, limited or restricted      Problem List Patient Active Problem List   Diagnosis Date Noted  . Numbness of hand 04/06/2016  . Estrogen deficiency 03/29/2016  . CAD (coronary artery disease), native coronary artery 10/26/2015  . Back pain 08/17/2015  . Atrial fibrillation (HCC) 02/23/2015  . PAC (premature atrial contraction) 01/19/2015  . Atrial tachycardia, paroxysmal (HCC) 01/19/2015  . Irregular heart rhythm 11/25/2014  . ASTHMA, INTERMITTENT 09/07/2010  . HERNIA, VENTRAL 04/18/2010  . 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 Diabetes mellitus type II, controlled, with no complications (HCC) 02/05/2007  . HYPERCHOLESTEROLEMIA 02/05/2007  . Obesity 02/05/2007  . DEPRESSION, MAJOR, RECURRENT 02/05/2007  . HYPERTENSION, BENIGN SYSTEMIC 02/05/2007  . GASTROESOPHAGEAL REFLUX, NO ESOPHAGITIS 02/05/2007  .  OSTEOARTHRITIS, MULTI SITES 02/05/2007    Kelli ChurnBallie, Lenoard Helbert Johnson, OTR/L  05/30/2016, 4:55 PM  [place] [facility] [address] [place], [place], [address] Phone: [phone]   Fax:  [phone]  Name: Meredith MateBetty K [name] MRN: [id number] Date of Birth: 08/27/1951

## 2016-06-06 ENCOUNTER — Ambulatory Visit (INDEPENDENT_AMBULATORY_CARE_PROVIDER_SITE_OTHER): Payer: Commercial Managed Care - HMO | Admitting: Neurology

## 2016-06-06 ENCOUNTER — Ambulatory Visit (INDEPENDENT_AMBULATORY_CARE_PROVIDER_SITE_OTHER): Payer: Self-pay | Admitting: Neurology

## 2016-06-06 DIAGNOSIS — G5601 Carpal tunnel syndrome, right upper limb: Secondary | ICD-10-CM

## 2016-06-06 DIAGNOSIS — G5600 Carpal tunnel syndrome, unspecified upper limb: Secondary | ICD-10-CM

## 2016-06-06 DIAGNOSIS — Z0289 Encounter for other administrative examinations: Secondary | ICD-10-CM

## 2016-06-06 HISTORY — DX: Carpal tunnel syndrome, unspecified upper limb: G56.00

## 2016-06-06 NOTE — Progress Notes (Signed)
EMG nerve conduction study report was under procedure section dated June 06 2016.

## 2016-06-06 NOTE — Procedures (Signed)
   NCS (NERVE CONDUCTION STUDY) WITH EMG (ELECTROMYOGRAPHY) REPORT   STUDY DATE: June 06 2016 PATIENT NAME: Meredith [name] DOB: 28-Oct-1951 MRN: [id number]    TECHNOLOGIST: Gearldine ShownLorraine Jones ELECTROMYOGRAPHER: Levert FeinsteinYan, Bassem Bernasconi M.D.  CLINICAL INFORMATION:  65 year old female presenting with chronic right finger paresthesia, mild weakness, she denies neck pain.  FINDINGS: NERVE CONDUCTION STUDY: Right ulnar sensory and motor responses were normal. Right median sensory responses showed moderately prolonged peak latency, with well preserved snap amplitude. Right median motor responses showed severely prolonged distal latency, and F-wave latency, with mildly decreased to C map amplitude, normal conduction velocity.  NEEDLE ELECTROMYOGRAPHY: Selective needle examinations was performed at right upper extremity muscles, right cervical paraspinal muscles.  Right abductor pollicis brevis: Increased insertional activity, no spontaneous activity, mildly enlarged complex motor unit with mildly decreased recruitment patterns.  Needle examination of right pronator teres, biceps, triceps, deltoid was normal.  There was no spontaneous activity at right cervical paraspinal muscles, right C5-6 and 7.  IMPRESSION:   This is an abnormal study. 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 I have suggested her right wrist splint, as needed NSAIDs.   INTERPRETING PHYSICIAN:   Levert FeinsteinYan, Alexya Mcdaris M.D. Ph.D. [facility] [address], [address] [place], [place] [address] [phone]

## 2016-06-20 ENCOUNTER — Ambulatory Visit: Payer: Commercial Managed Care - HMO | Attending: Radiology | Admitting: Occupational Therapy

## 2016-06-20 DIAGNOSIS — M79641 Pain in right hand: Secondary | ICD-10-CM | POA: Insufficient documentation

## 2016-06-20 DIAGNOSIS — M6281 Muscle weakness (generalized): Secondary | ICD-10-CM | POA: Diagnosis not present

## 2016-06-20 DIAGNOSIS — R208 Other disturbances of skin sensation: Secondary | ICD-10-CM

## 2016-06-20 NOTE — Patient Instructions (Signed)
PUTTY EXERCISES:   1. Grip Strengthening (Resistive Putty)   Squeeze putty using thumb and all fingers. Repeat _15__ times. Do __2__ sessions per day.   2. Roll putty into tube on table and pinch between each finger and thumb x 10 reps each. (Do ring and small finger together)     Desensitization Techniques  Perform these exercises every 6 hours for 15 minute sessions.  Progress to the next exercises when the exercises you are doing become easy.  1)  Using light pressure, rub the various textures along with the hypersensitive area:  A.  Flannel  E.  Polyester  B.  Velvet  F.  Corduroy  C.  Wool  G.  Cotton material  D.  Terry cloth  2)  With the same textures use a firmer pressure. 3)  Use a hand held vibrator and massage along the sensitive area. 4)  With a small dowel rod, eraser on a pencil or base of an ink pen tap along the sensitive area. 5)  Use an empty roll-on deodorant bottle to roll along the sensitive area. 6)  Place your hand/forearm in separate containers of the following items:  A.  Sand  D.  Dry lentil beans  B.  Dry Rice  E.  Dry kidney beans  C.  Ball bearings F.  Dry pinto beans   Massage  Technique: Use a cream to massage with, as it insures a smooth gliding motion and avoids irritation caused by rubbing skin to skin.  Cream is preferred over a lotion.   May begin as soon as any suture areas are healed.   Apply a firm, steady pressure with your finger-tip, pulling the skin in a circular motion over the scarred area.  Do not rub.  Massage 5 minutes, at least 2 times a day, unless you are getting tender afterwards   PAIN MANAGEMENT: Desensitization techniques above, use of heat (hot pack or paraffin)  when needed (especially during cold weather) for no more than 20 minutes at a time, wear isotoner glove or mitten in winter months as needed, consult with MD re: pain medicine or topical rubs if above measures don't help

## 2016-06-20 NOTE — Therapy (Signed)
[facility] [address] [place], [place], [address] Phone: [phone]   Fax:  [phone]  Occupational Therapy Treatment  Patient Details  Name: Meredith [name] MRN: [id number] Date of Birth: 1951-01-02 Referring Provider: Dr. Anders Simmonds  Encounter Date: 06/20/2016      OT End of Session - 06/20/16 1435    Visit Number 2   Number of Visits 5   Date for OT Re-Evaluation 06/29/16   Authorization Type [employer] MCR - G code needed, MCD secondary   Authorization Time Period week 1/4   OT Start Time 1350   OT Stop Time 1435   OT Time Calculation (min) 45 min   Activity Tolerance Patient tolerated treatment well      Past Medical History  Diagnosis Date  . Diabetes mellitus   . Hypertension   . Hyperlipidemia   . Osteoarthritis   . ANEMIA, IRON DEFICIENCY, UNSPEC. 02/05/2007    Qualifier: Diagnosis of  By: Para March MD, Cheree Ditto    . Ventral hernia   . Enteritis   . DJD (degenerative joint disease) of lumbar spine   . Diverticulosis 05/17/12  . Renal cyst, left 05/17/12  . Gastric ulcer 04/2000  . Colon polyps   . Irregular heart beat   . Obesity   . CAD (coronary artery disease), native coronary artery 02/16/2015    Cath with 20% LCx and RCA    Past Surgical History  Procedure Laterality Date  . Tubal ligation    . Uterine fibroid surgery    . Operative hysteroscopy    . Abdominal hysterectomy    . Left heart catheterization with coronary angiogram N/A 02/16/2015    Procedure: LEFT HEART CATHETERIZATION WITH CORONARY ANGIOGRAM;  Surgeon: Kathleene Hazel, MD;  [facility] C[facility] LAB;  Service: Cardiovascular;  Laterality: N/A;    There were no vitals filed for this visit.      Subjective Assessment - 06/20/16 1357    Subjective  It doesn't hurt today, the numbness comes and goes   Pertinent History DM, A-fib   Patient Stated Goals to get strength and pain relief in my Rt hand   Currently in Pain?  No/denies                      OT Treatments/Exercises (OP) - 06/20/16 0001    ADLs   ADL Comments Discussed desensitization techniques and other pain management strategies (handout provided)    Exercises   Exercises Hand   Hand Exercises   Other Hand Exercises Pt issued putty HEP - see pt instructions for details. Pt issued red resistance putty for home   Modalities   Modalities Fluidotherapy   RUE Fluidotherapy   Number Minutes Fluidotherapy 12 Minutes   RUE Fluidotherapy Location Hand;Wrist   Comments at beginning of session for sensory input, pain management                OT Education - 06/20/16 1427    Education provided Yes   Education Details Theraputty HEP, Desensitization and pain management strategies   Person(s) Educated Patient   Methods Explanation;Demonstration;Handout   Comprehension Verbalized understanding;Returned demonstration             OT Long Term Goals - 05/30/16 1648    OT LONG TERM GOAL #1   Title Pt independent with HEP for Rt hand (all LTG's due 06/29/16)    Time 4   Period Weeks   Status New  OT LONG TERM GOAL #2   Title Pt to improve grip strength by 5 # or > Rt hand    Baseline eval = 30 lbs (Lt = 40)   Time 4   Period Weeks   Status New   OT LONG TERM GOAL #3   Title Pt to report increased legibility with handwriting   Time 4   Period Weeks   Status New   OT LONG TERM GOAL #4   Title Pt to id pain management strategies, desensitization techniques, and safety techniques d/t altered sensation Rt hand   Time 4   Period Weeks   Status New   OT LONG TERM GOAL #5   Title Pt to report overall decreased drops Rt hand throughout day   Time 4   Period Weeks   Status New               Plan - 06/20/16 1435    Clinical Impression Statement Pt able to demo putty ex's and increased understanding of pain management strategies. Approximating LTG's #1 and #4   OT Frequency 1x / week   OT Duration 4 weeks    OT Treatment/Interventions Self-care/ADL training;Therapeutic exercise;Patient/family education;Manual Therapy;DME and/or AE instruction;Parrafin;Therapeutic activities;Fluidtherapy;Moist Heat;Passive range of motion   Plan paraffin, handwriting, gripper and clothespin activity, safety considerations   Consulted and Agree with Plan of Care Patient      Patient will benefit from skilled therapeutic intervention in order to improve the following deficits and impairments:  Impaired sensation, Impaired UE functional use, Pain, Decreased strength  Visit Diagnosis: Pain in right hand  Muscle weakness (generalized)  Other disturbances of skin sensation    Problem List Patient Active Problem List   Diagnosis Date Noted  . CTS (carpal tunnel syndrome) 06/06/2016  . Numbness of hand 04/06/2016  . Estrogen deficiency 03/29/2016  . CAD (coronary artery disease), native coronary artery 10/26/2015  . Back pain 08/17/2015  . Atrial fibrillation (HCC) 02/23/2015  . PAC (premature atrial contraction) 01/19/2015  . Atrial tachycardia, paroxysmal (HCC) 01/19/2015  . Irregular heart rhythm 11/25/2014  . ASTHMA, INTERMITTENT 09/07/2010  . HERNIA, VENTRAL 04/18/2010  . 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 Diabetes mellitus type II, controlled, with no complications (HCC) 02/05/2007  . HYPERCHOLESTEROLEMIA 02/05/2007  . Obesity 02/05/2007  . DEPRESSION, MAJOR, RECURRENT 02/05/2007  . HYPERTENSION, BENIGN SYSTEMIC 02/05/2007  . GASTROESOPHAGEAL REFLUX, NO ESOPHAGITIS 02/05/2007  . OSTEOARTHRITIS, MULTI SITES 02/05/2007    Kelli ChurnBallie, Kadijah Shamoon Johnson, OTR/L 06/20/2016, 2:38 PM  [place] [facility] [address] [place], [place], [address] Phone: [phone]   Fax:  [phone]  Name: Meredith [name] MRN: [id number] Date of Birth: 03/28/1951

## 2016-06-27 ENCOUNTER — Encounter: Payer: Commercial Managed Care - HMO | Admitting: Occupational Therapy

## 2016-06-27 ENCOUNTER — Other Ambulatory Visit: Payer: Self-pay | Admitting: Family Medicine

## 2016-06-27 DIAGNOSIS — Z139 Encounter for screening, unspecified: Secondary | ICD-10-CM

## 2016-07-04 ENCOUNTER — Encounter: Payer: Commercial Managed Care - HMO | Admitting: Occupational Therapy

## 2016-07-04 ENCOUNTER — Ambulatory Visit
Admission: RE | Admit: 2016-07-04 | Discharge: 2016-07-04 | Disposition: A | Discharge status: Home / Self Care | Payer: Commercial Managed Care - HMO | Source: Ambulatory Visit | Attending: Family Medicine | Admitting: Family Medicine

## 2016-07-04 DIAGNOSIS — Z139 Encounter for screening, unspecified: Secondary | ICD-10-CM

## 2016-07-04 DIAGNOSIS — Z1231 Encounter for screening mammogram for malignant neoplasm of breast: Secondary | ICD-10-CM | POA: Diagnosis not present

## 2016-07-11 ENCOUNTER — Encounter: Payer: Commercial Managed Care - HMO | Admitting: Occupational Therapy

## 2016-07-19 ENCOUNTER — Encounter: Payer: Self-pay | Admitting: Family Medicine

## 2016-07-19 ENCOUNTER — Ambulatory Visit (INDEPENDENT_AMBULATORY_CARE_PROVIDER_SITE_OTHER): Payer: Commercial Managed Care - HMO | Admitting: Family Medicine

## 2016-07-19 VITALS — BP 120/86 | HR 71 | Temp 98.1°F | Ht 63.0 in | Wt 250.0 lb

## 2016-07-19 DIAGNOSIS — E119 Type 2 diabetes mellitus without complications: Secondary | ICD-10-CM

## 2016-07-19 DIAGNOSIS — M25562 Pain in left knee: Secondary | ICD-10-CM

## 2016-07-19 DIAGNOSIS — Z0000 Encounter for general adult medical examination without abnormal findings: Secondary | ICD-10-CM | POA: Diagnosis not present

## 2016-07-19 DIAGNOSIS — Z1159 Encounter for screening for other viral diseases: Secondary | ICD-10-CM | POA: Diagnosis not present

## 2016-07-19 DIAGNOSIS — I10 Essential (primary) hypertension: Secondary | ICD-10-CM | POA: Diagnosis not present

## 2016-07-19 DIAGNOSIS — M25561 Pain in right knee: Secondary | ICD-10-CM

## 2016-07-19 LAB — POCT GLYCOSYLATED HEMOGLOBIN (HGB A1C): Hemoglobin A1C: 7.2

## 2016-07-19 MED ORDER — TRAMADOL HCL 50 MG PO TABS: 50.0000 mg | ORAL_TABLET | Freq: Four times a day (QID) | ORAL | 0 refills | Status: DC | PRN

## 2016-07-19 NOTE — Patient Instructions (Signed)
Thank you for coming in today, it was so nice to see you. Today we talked about:   Blood pressure (IS AWESOME), diabetes (please continue to decrease sugar intake), and knee pain (refilled tramadol when you need it)  Please follow up in 3 months. You can schedule this appointment at the front desk before you leave or call the clinic.  If you have any questions or concerns, please do not hesitate to call the office at [phone]. You can also message me directly via MyChart.   Sincerely,  Meredith Simmondshristina Jakevion Arney, MD   Diet Recommendations for Diabetes   Starchy (carb) foods include: Bread, rice, pasta, potatoes, corn, crackers, bagels, muffins, all baked goods.  (Fruits, milk, and yogurt also have carbohydrate, but most of these foods will not spike your blood sugar as the starchy foods will.)  A few fruits do cause high blood sugars; use small portions of bananas (limit to 1/2 at a time), grapes, watermelon, and most tropical fruits.    Protein foods include: Meat, fish, poultry, eggs, dairy foods, and beans such as pinto and kidney beans (beans also provide carbohydrate).   1. Eat at least 3 meals and 1-2 snacks per day. Never go more than 4-5 hours while awake without eating. Eat breakfast within the first hour of getting up.   2. Limit starchy foods to TWO per meal and ONE per snack. ONE portion of a starchy  food is equal to the following:   - ONE slice of bread (or its equivalent, such as half of a hamburger bun).   - 1/2 cup of a "scoopable" starchy food such as potatoes or rice.   - 15 grams of carbohydrate as shown on food label.  3. Include at every meal: a protein food, a carb food, and vegetables and/or fruit.   - Obtain twice as many veg's as protein or carbohydrate foods for both lunch and dinner.   - Fresh or frozen veg's are best.   - Try to keep frozen veg's on hand for a quick vegetable serving.

## 2016-07-19 NOTE — Progress Notes (Signed)
Subjective:    Patient ID: Meredith [name] , female   DOB: [birth year] , 65 y.o..   MRN: [id number]  HPI  Meredith MateBetty K Bradshaw is here for follow up.  Hypertension Saw cardiologist in early June for a-fib and HTN, was told to continue current medications. Blood pressure at home: not taking, her BP cuff baterry died Exercise: Walks around her neighborhood a few times a week Low salt diet: no Medications: Compliant with Amlodipine, HCTZ, and Metoprolol Side effects: None ROS: Denies headache, dizziness, visual changes, nausea, vomiting, chest pain, abdominal pain or shortness of breath. BP Readings from Last 3 Encounters:  07/19/16 120/86  05/10/16 (!) 150/88  04/05/16 (!) 163/73    Chronic Diabetes:  Disease Monitoring  Polyuria: no   Visual problems: yes, no worsening vision but wears glasses   Medication Compliance: yes  Medication Side Effects  Hypoglycemia: no   Preventitive Health Care  Eye Exam: 08/25/2015  Foot Exam: 03/01/2016  Diet pattern: Continues to eat mostly whatever she wants including sugary beverages like sweet tea  Exercise: Walks around the block sometimes  Bilateral Knee Pain:  Same knee pain she has had for the last 3-4 years that is worse with ambulation. Currently taking Tramadol PRN which provides relief. Has had knee injections in the past and did not like them because she does not like needles. Does not want physical therapy. Patient does not want to have any knee replacement. She would like some tramadol because she feels like it helps. She has tried tylenol but it didn't help as much. She is also hesistant about seeing any orthopedic specialist at this time due to financial limitations. She states she still hasn't even been following up for carpal tunnel syndrome because she can't afford it.   Review of Systems: Per HPI. All other systems reviewed and are negative.   Past Medical History: Patient Active Problem List   Diagnosis Date Noted  . CTS  (carpal tunnel syndrome) 06/06/2016  . Numbness of hand 04/06/2016  . Estrogen deficiency 03/29/2016  . CAD (coronary artery disease), native coronary artery 10/26/2015  . Back pain 08/17/2015  . Atrial fibrillation (HCC) 02/23/2015  . PAC (premature atrial contraction) 01/19/2015  . Atrial tachycardia, paroxysmal (HCC) 01/19/2015  . Irregular heart rhythm 11/25/2014  . ASTHMA, INTERMITTENT 09/07/2010  . HERNIA, VENTRAL 04/18/2010  . 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 Diabetes mellitus type II, controlled, with no complications (HCC) 02/05/2007  . HYPERCHOLESTEROLEMIA 02/05/2007  . Obesity 02/05/2007  . DEPRESSION, MAJOR, RECURRENT 02/05/2007  . HYPERTENSION, BENIGN SYSTEMIC 02/05/2007  . GASTROESOPHAGEAL REFLUX, NO ESOPHAGITIS 02/05/2007  . OSTEOARTHRITIS, MULTI SITES 02/05/2007    Medications: reviewed and updated  Social Hx:  reports that she quit smoking about 37 years ago. Her smoking use included Cigarettes. She has never used smokeless tobacco.    Objective:   BP 120/86   Pulse 71   Temp 98.1 F (36.7 C) (Oral)   Ht 5\' 3"  (1.6 m)   Wt 250 lb (113.4 kg)   SpO2 96%   BMI 44.29 kg/m  Physical Exam  Gen: NAD, alert, cooperative with exam, well-appearing, obese Cardiac: Regular rate and rhythm, normal S1/S2, no murmur, no edema, capillary refill brisk  Respiratory: Clear to auscultation bilaterally, no wheezes, non-labored Gastrointestinal: soft, non tender, non distended, bowel sounds present Skin: no rashes, normal turgor  Neurological: no gross deficits. Antalgic gait Psych: good insight, normal mood and affect MSK: Left Knee: Normal to inspection with no  erythema or effusion or obvious bony abnormalities. Palpation: No warmth,no joint line tenderness, patellar tenderness, or condyle tenderness. ROM full in flexion and extension and lower leg rotation.  Ligaments with solid consistent endpoints including ACL, PCL, LCL, MCL. Negative  Mcmurray's Non painful patellar compression. Patellar glide with crepitus. Patellar and quadriceps tendons unremarkable. Hamstring and quadriceps strength is normal.   Right Knee: Normal to inspection with no erythema or effusion or obvious bony abnormalities. Palpation: No warmth, patellar tenderness, or condyle tenderness. Positive medial joint line tenderness ROM full in flexion and extension and lower leg rotation. Ligaments with solid consistent endpoints including ACL, PCL, LCL, MCL. Negative Mcmurray's Non painful patellar compression. Patellar glide with crepitus. Patellar and quadriceps tendons unremarkable. Hamstring and quadriceps strength is normal.     Assessment & Plan:  HYPERTENSION, BENIGN SYSTEMIC Controlled. Continue current Amlodipine, HCTZ, and Metoprolol  Diabetes mellitus type II, controlled, with no complications (HCC) Uncontrolled with hgb A1C goal of less than 7%. Hgb A1C 7.2% today (was 7% on 03/04/16).  -Discussed decreasing sugary beverages such as sweet tea - Provided patient with diabetic diet plan - Continue Metformin 200 mg daily - Recheck A1C in 3 months - If A1C continues to rise, will see if patient is willing to see nutritionist as it seems her diet is major contributing factor to rising A1C   Bilateral knee pain Chronic pain likely due to osteoarthritis. Last knee XRays per Epic were 2015, showing degenerative changes in bilateral knees particularly in the medial compartments with complete loss of joint space. At this point I suspect patient will need both knees replaced. We discussed referral to orthopedic specialist but patient not interested at this time. Will continue Tramadol 50 mg q 6 PRN for pain, refilled medication today. Will revisit the need for referral to orthopedic specialist in the future.

## 2016-07-20 ENCOUNTER — Encounter: Payer: Self-pay | Admitting: Family Medicine

## 2016-07-20 LAB — HEPATITIS C ANTIBODY: HCV Ab: NEGATIVE

## 2016-07-21 DIAGNOSIS — G8929 Other chronic pain: Secondary | ICD-10-CM | POA: Insufficient documentation

## 2016-07-21 DIAGNOSIS — M25569 Pain in unspecified knee: Secondary | ICD-10-CM

## 2016-07-21 NOTE — Assessment & Plan Note (Signed)
Uncontrolled with hgb A1C goal of less than 7%. Hgb A1C 7.2% today (was 7% on 03/04/16).  -Discussed decreasing sugary beverages such as sweet tea - Provided patient with diabetic diet plan - Continue Metformin 200 mg daily - Recheck A1C in 3 months - If A1C continues to rise, will see if patient is willing to see nutritionist as it seems her diet is major contributing factor to rising A1C

## 2016-07-21 NOTE — Assessment & Plan Note (Signed)
Chronic pain likely due to osteoarthritis. Last knee XRays per Epic were 2015, showing degenerative changes in bilateral knees particularly in the medial compartments with complete loss of joint space. At this point I suspect patient will need both knees replaced. We discussed referral to orthopedic specialist but patient not interested at this time. Will continue Tramadol 50 mg q 6 PRN for pain, refilled medication today. Will revisit the need for referral to orthopedic specialist in the future.

## 2016-07-21 NOTE — Assessment & Plan Note (Signed)
Controlled. Continue current Amlodipine, HCTZ, and Metoprolol

## 2016-07-22 ENCOUNTER — Encounter: Payer: Self-pay | Admitting: Family Medicine

## 2016-07-27 ENCOUNTER — Other Ambulatory Visit: Payer: Self-pay | Admitting: Family Medicine

## 2016-08-16 ENCOUNTER — Other Ambulatory Visit: Payer: Self-pay | Admitting: Family Medicine

## 2016-09-05 DIAGNOSIS — H524 Presbyopia: Secondary | ICD-10-CM | POA: Diagnosis not present

## 2016-09-05 DIAGNOSIS — H52203 Unspecified astigmatism, bilateral: Secondary | ICD-10-CM | POA: Diagnosis not present

## 2016-09-05 DIAGNOSIS — E119 Type 2 diabetes mellitus without complications: Secondary | ICD-10-CM | POA: Diagnosis not present

## 2016-09-05 DIAGNOSIS — H2513 Age-related nuclear cataract, bilateral: Secondary | ICD-10-CM | POA: Diagnosis not present

## 2016-09-05 LAB — HM DIABETES EYE EXAM

## 2016-09-18 ENCOUNTER — Other Ambulatory Visit: Payer: Self-pay | Admitting: Family Medicine

## 2016-09-19 ENCOUNTER — Ambulatory Visit (INDEPENDENT_AMBULATORY_CARE_PROVIDER_SITE_OTHER): Payer: Commercial Managed Care - HMO | Admitting: Student

## 2016-09-19 ENCOUNTER — Encounter: Payer: Self-pay | Admitting: Student

## 2016-09-19 VITALS — BP 175/97 | HR 71 | Temp 97.7°F | Ht 63.0 in | Wt 246.0 lb

## 2016-09-19 DIAGNOSIS — G8929 Other chronic pain: Secondary | ICD-10-CM

## 2016-09-19 DIAGNOSIS — M25561 Pain in right knee: Secondary | ICD-10-CM | POA: Diagnosis not present

## 2016-09-19 DIAGNOSIS — M25562 Pain in left knee: Secondary | ICD-10-CM | POA: Diagnosis not present

## 2016-09-19 DIAGNOSIS — I10 Essential (primary) hypertension: Secondary | ICD-10-CM | POA: Diagnosis not present

## 2016-09-19 NOTE — Progress Notes (Signed)
   Subjective:    Patient ID: Meredith [name] is a 65 y.o. old female.  HPI #Bilateral knee pain: this has been going on for about a year. She knows she has OA. It hurts more for the last 4 days. No recent trauma. She walks from her house to store and back home. She doesn't know how long and how far. She walk slowly. She describes the pain as throbbing. It gets sharp when she walks. It is always there. It hurts more when she walks but she still continues to walk so it doesn't get "stiff" on her. Pain is over frontal aspect of her knees. No radiation. Denies weakness, numbness in her legs. Denies swelling in her knee. Denies fever, chills. She tried tramadol when her pain is severe; one tablet every 3 days. She also tried "tropical" gel sent to her in mail but it didn't work.   PMH: reviewed  SH: denies smoking  Review of Systems Per HPI Objective:   Vitals:   09/19/16 1349  BP: (!) 175/97  Pulse: 71  Temp: 97.7 F (36.5 C)  TempSrc: Oral  Weight: 246 lb (111.6 kg)  Height: 5\' 3"  (1.6 m)    GEN: appears well, obese, no apparent distress. RESP: no increased work of breathing MSK:  Knees: no apparent swelling or skin erythema bilaterally, no increased warmth, full range of motion, positive for crepitus bilaterally, tender to palpation over the joint lines in both knees, negative anterior and posterior drawer signs.  SKIN: no erythema or apparent lesion NEURO: alert and oriented appropriately, no gross defecits  PSYCH: appropriate mood and affect, contemptuous    Assessment & Plan:  Knee pain, chronic Likely osteoarthritis. No effusion or sign of infection. No red flags except for age. She has been using her tramadol 1 tablet in about 3 days out of concern for habituation. She is walking daily.  -Advised her to use her tramadol as prescribed  HYPERTENSION, BENIGN SYSTEMIC Noted that her blood pressure was elevated to 170/97. I requested repeat blood pressure. Unfortunately, this  was not documented. Patient reports taking her blood pressure medication this morning. She didn't bring the medication bottles for this visit. Advised patient to follow-up with PCP on this.   Patient is contempt about flu vaccine and pneumonia vaccination.

## 2016-09-19 NOTE — Patient Instructions (Addendum)
It was great seeing you today! We have addressed the following issues today  1. Knee pain: this is likely due to osteoarthritis. I recommend you take the tramadol as prescribed. I also recommend lifestyle changes such as more exercise and eating healthy with plenty of vegetables. Please contact your pharmacy if you need refill on your tramadol.  2. Blood pressure: I recommend you follow up with your primary care doctor. Your goal blood pressure is less than 140/90. 3. I strongly recommended flu and pneumonia vaccine.    If we did any lab work today, and the results require attention, either me or my nurse will get in touch with you. If everything is normal, you will get a letter in mail. If you don't hear from us in two weeks, please give us a call.  If you have any questions or concerns before then, please call the clinic at [phone].  Please bring all your medications to every doctors visit   Sign up for My Chart to have easy access to your labs results, and communication with your Primary care physician.    Please check-out at the front desk before leaving the clinic.   Take Care,

## 2016-09-20 ENCOUNTER — Other Ambulatory Visit: Payer: Self-pay | Admitting: Family Medicine

## 2016-09-20 NOTE — Assessment & Plan Note (Signed)
Likely osteoarthritis. No effusion or sign of infection. No red flags except for age. She has been using her tramadol 1 tablet in about 3 days out of concern for habituation. She is walking daily.  -Advised her to use her tramadol as prescribed

## 2016-09-20 NOTE — Assessment & Plan Note (Addendum)
Noted that her blood pressure was elevated to 170/97. I requested repeat blood pressure. Unfortunately, this was not documented. Patient reports taking her blood pressure medication this morning. She didn't bring the medication bottles for this visit. Advised patient to follow-up with PCP on this.

## 2016-09-27 ENCOUNTER — Telehealth: Payer: Self-pay | Admitting: Family Medicine

## 2016-09-27 NOTE — Telephone Encounter (Signed)
Pt called about her tramadol refill but it has been refused. Please let pt know why

## 2016-09-30 ENCOUNTER — Telehealth: Payer: Self-pay

## 2016-09-30 NOTE — Telephone Encounter (Signed)
If pt calls, please schedule an appt for pt to be seen. Sunday SpillersSharon T Saunders, CMA

## 2016-09-30 NOTE — Telephone Encounter (Signed)
Called pt. Spoke to a family member. He said she work from 8 am until 9pm. I asked him to have her call our office tomorrow. Meredith SpillersSharon T Tytionna Cloyd, CMA

## 2016-09-30 NOTE — Telephone Encounter (Signed)
Spoke to pt family member. I was told pt works from 8am until 9pm. I asked if he would have pt give our office a call. Pt needs appt for pain meds. Sunday SpillersSharon T Saunders, CMA

## 2016-09-30 NOTE — Telephone Encounter (Signed)
White team please call patient and inform her she needs an appointment to receive Tramadol refills as this is a controlled substance and requires paper scripts. Thank you.

## 2016-10-01 NOTE — Telephone Encounter (Signed)
Error

## 2016-10-03 ENCOUNTER — Telehealth: Payer: Self-pay | Admitting: Family Medicine

## 2016-10-03 NOTE — Telephone Encounter (Signed)
Pt is a diabetic and eye is blood shot red. Pt would like to know what to do. Please advise. Thanks! ep

## 2016-10-04 NOTE — Telephone Encounter (Signed)
Spoke with pt.  Eyes are clearing up today. They were never painful and see never had any visual disturbance.  She did c/o of slight itchiness. Advised that wince they were clearing up this am and were itchy that the redness was likely from allergies.  Pt to call back if she is having pain or loss of vision.  She is up to date on her diabetic eye exam as [place] Cason Luffman, Maryjo RochesterJessica Dawn, CMA

## 2016-10-08 ENCOUNTER — Other Ambulatory Visit: Payer: Self-pay | Admitting: Cardiology

## 2016-10-10 ENCOUNTER — Ambulatory Visit (INDEPENDENT_AMBULATORY_CARE_PROVIDER_SITE_OTHER): Payer: Commercial Managed Care - HMO | Admitting: Family Medicine

## 2016-10-10 ENCOUNTER — Encounter: Payer: Self-pay | Admitting: Family Medicine

## 2016-10-10 VITALS — BP 159/61 | HR 71 | Temp 97.6°F | Ht 63.0 in | Wt 247.6 lb

## 2016-10-10 DIAGNOSIS — I10 Essential (primary) hypertension: Secondary | ICD-10-CM | POA: Diagnosis not present

## 2016-10-10 DIAGNOSIS — Z13820 Encounter for screening for osteoporosis: Secondary | ICD-10-CM

## 2016-10-10 DIAGNOSIS — Z0000 Encounter for general adult medical examination without abnormal findings: Secondary | ICD-10-CM

## 2016-10-10 DIAGNOSIS — E119 Type 2 diabetes mellitus without complications: Secondary | ICD-10-CM | POA: Diagnosis not present

## 2016-10-10 LAB — GLUCOSE, POCT (MANUAL RESULT ENTRY): POC GLUCOSE: 142 mg/dL — AB (ref 70–99)

## 2016-10-10 LAB — POCT GLYCOSYLATED HEMOGLOBIN (HGB A1C): Hemoglobin A1C: 6.9

## 2016-10-10 MED ORDER — ZOSTER VACCINE LIVE 19400 UNT/0.65ML ~~LOC~~ SUSR: 0.6500 mL | Freq: Once | SUBCUTANEOUS | 0 refills | Status: AC

## 2016-10-10 MED ORDER — TRAMADOL HCL 50 MG PO TABS: 50.0000 mg | ORAL_TABLET | Freq: Four times a day (QID) | ORAL | 0 refills | Status: DC | PRN

## 2016-10-10 NOTE — Progress Notes (Signed)
Subjective:  Meredith [name] is a 65 y.o. female who presents to [facility] today with a chief complaint of medication refills.  HPI:  Hypertension: Patient seen on 09/19/2016, blood pressure at that point was 175/97. Previously well controlled, and on occasion blood pressure is elevated possibly due to white coat syndrome.  She denies any red flag symptoms, such as chest pain, shortness of breath, changes in vision or headaches.  Ocasionally (~4x per month) will feel lightheaded when she stands up.   Chronic pain: History of degenerative disc disease in the lumbar spine, and osteoarthritis in multiple joints. Takes tramadol, and is here today for a refill. No history of narcotic abuse, no numbness or tingling in extremities.   Health maintenance: Patient overdue for Zostavax vaccination, DEXA scan, Prevnar and ophthalmology exam. Denies any changes in vision, however per chart review she call the office with complaints of redness in her eyes without any vision changes, pain or discharge  SH: Patient is a nonsmoker PMH: Oster arthritis, type 2 diabetes, hyperlipidemia ROS: See history of present illness  Objective:  Physical Exam: BP (!) 159/61 (BP Location: Left Arm, Patient Position: Sitting, Cuff Size: Normal)   Pulse 71   Temp 97.6 F (36.4 C) (Oral)   Ht 5\' 3"  (1.6 m)   Wt 247 lb 9.6 oz (112.3 kg)   BMI 43.86 kg/m   Gen: 65 year old female in NAD, resting comfortably HEENT: EOMI, PERRL, no pain with extraocular movements, no discharge and eyes noninjected CV: RRR with no murmurs appreciated Pulm: NWOB, CTAB with no crackles, wheezes, or rhonchi GI: Normal bowel sounds present. Soft, Nontender, Nondistended. MSK: no edema, cyanosis, or clubbing noted, tenderness in bilateral knees Skin: warm, dry Neuro: grossly normal, moves all extremities Psych: Normal affect and thought content  Results for orders placed or performed in visit on 10/10/16 (from the past 72 hour(s))    Glucose (CBG)     Status: None   Collection Time: 10/10/16  8:54 AM  Result Value Ref Range   POC Glucose 142 (A) 70 - 99 mg/dl  POCT glycosylated hemoglobin (Hb A1C)     Status: None   Collection Time: 10/10/16  9:34 AM  Result Value Ref Range   Hemoglobin A1C 6.9      Assessment/Plan:  HYPERTENSION, BENIGN SYSTEMIC Blood pressure today was 159/61, patient denies any chest pain, shortness of breath, changes in vision or headache.  Last visit blood pressure was 175/97. Patient endorses some lightheadedness occasionally, around 4 times per month when she stands up.  For that reason, I will hold off on increasing her amlodipine 5 mg to 10 mg.  - Plan to follow-up with primary care doctor in 1 month for a pressure follow-up - Consider increasing 5 mg amlodipine to 10 mg at that point, if appropriate.   OSTEOARTHRITIS, MULTI SITES Osteoarthritis in multiple joints, and degenerative disc disease in the lumbar spine. Here today for refill of tramadol. Also requests that I fill out disability paperwork to that she's able to get a placard.  I feel that her arthritis and disc disease as significantly impair her ability to walk for that reason I have agreed to fill out a temporary placard for 6 months and she can be reevaluated at that point. - Filled out temporary placard for 6 months - Refilled tramadol, patient to follow-up in 1 month for refill  Healthcare maintenance Patient overdue for Zostavax vaccination, DEXA scan, Prevnar and ophthalmology exam. States that she had a  vision scan 1 month ago, we need these results satisfy her overdue health maintenance.  Patient declines Zostavax vaccination and Prevnar had previously declined flu shot at a recent visit. However she has agreed to get a DEXA scan and I have put this order. - Follow-up DEXA scan - Continue to encourage patient to receive Zostavax vaccination, Prevnar and flu shot

## 2016-10-10 NOTE — Assessment & Plan Note (Signed)
Patient overdue for Zostavax vaccination, DEXA scan, Prevnar and ophthalmology exam. States that she had a vision scan 1 month ago, we need these results satisfy her overdue health maintenance.  Patient declines Zostavax vaccination and Prevnar had previously declined flu shot at a recent visit. However she has agreed to get a DEXA scan and I have put this order. - Follow-up DEXA scan - Continue to encourage patient to receive Zostavax vaccination, Prevnar and flu shot

## 2016-10-10 NOTE — Assessment & Plan Note (Signed)
Blood pressure today was 159/61, patient denies any chest pain, shortness of breath, changes in vision or headache.  Last visit blood pressure was 175/97. Patient endorses some lightheadedness occasionally, around 4 times per month when she stands up.  For that reason, I will hold off on increasing her amlodipine 5 mg to 10 mg.  - Plan to follow-up with primary care doctor in 1 month for a pressure follow-up - Consider increasing 5 mg amlodipine to 10 mg at that point, if appropriate.

## 2016-10-10 NOTE — Patient Instructions (Addendum)
Meredith [name], you were seen today for follow-up to refill some medications. Your blood pressure was elevated today to 159/61.  This is higher than I would like to see, however you have told me that you sometimes have some symptoms of lightheadedness when you stands up.  For that reason I'm going to hold off on increasing her blood pressure medication at the moment.  Please follow up in 4 weeks for a blood pressure check.   I have also filled out paperwork for a disability placard for you due to your degenerative disc disease, and osteoarthritis and I have refilled your tramadol.  I would highly recommend trying to exercise more, perhaps start with walking about 15 minutes per day. Weight loss will help quite a bit with the arthritis.  Nice meeting you,  Rande BruntDaniel L. Myrtie SomanWarden, MD [facility] Resident PGY-1 10/10/2016 9:30 AM

## 2016-10-10 NOTE — Assessment & Plan Note (Signed)
Osteoarthritis in multiple joints, and degenerative disc disease in the lumbar spine. Here today for refill of tramadol. Also requests that I fill out disability paperwork to that she's able to get a placard.  I feel that her arthritis and disc disease as significantly impair her ability to walk for that reason I have agreed to fill out a temporary placard for 6 months and she can be reevaluated at that point. - Filled out temporary placard for 6 months - Refilled tramadol, patient to follow-up in 1 month for refill

## 2016-10-16 ENCOUNTER — Encounter (HOSPITAL_COMMUNITY): Payer: Self-pay | Admitting: Emergency Medicine

## 2016-10-16 ENCOUNTER — Emergency Department (HOSPITAL_COMMUNITY)
Admission: EM | Admit: 2016-10-16 | Discharge: 2016-10-16 | Disposition: A | Discharge status: Home / Self Care | Payer: Commercial Managed Care - HMO | Attending: Emergency Medicine | Admitting: Emergency Medicine

## 2016-10-16 ENCOUNTER — Emergency Department (HOSPITAL_COMMUNITY): Payer: Commercial Managed Care - HMO

## 2016-10-16 DIAGNOSIS — M7711 Lateral epicondylitis, right elbow: Secondary | ICD-10-CM | POA: Diagnosis not present

## 2016-10-16 DIAGNOSIS — I2510 Atherosclerotic heart disease of native coronary artery without angina pectoris: Secondary | ICD-10-CM | POA: Diagnosis not present

## 2016-10-16 DIAGNOSIS — I10 Essential (primary) hypertension: Secondary | ICD-10-CM | POA: Insufficient documentation

## 2016-10-16 DIAGNOSIS — Z87891 Personal history of nicotine dependence: Secondary | ICD-10-CM | POA: Diagnosis not present

## 2016-10-16 DIAGNOSIS — J45909 Unspecified asthma, uncomplicated: Secondary | ICD-10-CM | POA: Insufficient documentation

## 2016-10-16 DIAGNOSIS — M722 Plantar fascial fibromatosis: Secondary | ICD-10-CM

## 2016-10-16 DIAGNOSIS — Z7984 Long term (current) use of oral hypoglycemic drugs: Secondary | ICD-10-CM | POA: Insufficient documentation

## 2016-10-16 DIAGNOSIS — E119 Type 2 diabetes mellitus without complications: Secondary | ICD-10-CM | POA: Insufficient documentation

## 2016-10-16 DIAGNOSIS — M25571 Pain in right ankle and joints of right foot: Secondary | ICD-10-CM

## 2016-10-16 LAB — CBG MONITORING, ED: Glucose-Capillary: 159 mg/dL — ABNORMAL HIGH (ref 65–99)

## 2016-10-16 MED ORDER — PREDNISONE 20 MG PO TABS
60.0000 mg | ORAL_TABLET | Freq: Once | ORAL | Status: AC
  Administered 2016-10-16: 60 mg via ORAL
  Filled 2016-10-16: qty 3

## 2016-10-16 MED ORDER — PREDNISONE 50 MG PO TABS: ORAL_TABLET | ORAL | 0 refills | Status: DC

## 2016-10-16 NOTE — ED Provider Notes (Signed)
[facility]-EMERGENCY DEPT Provider Note   CSN: [id number] Arrival date & time: 10/16/16  0118  By signing my name below, I, Modena Jansky, attest that this documentation has been prepared under the direction and in the presence of Zadie Rhine, MD . Electronically Signed: Modena Jansky, Scribe. 10/16/2016. 2:45 AM.  History   Chief Complaint Chief Complaint  Patient presents with  . Leg Pain   The history is provided by the patient. No language interpreter was used.  Leg Pain   This is a new problem. The current episode started 6 to 12 hours ago. The problem occurs constantly. The problem has not changed since onset.The pain is present in the right knee and right ankle. The quality of the pain is described as constant. The pain is moderate. The symptoms are aggravated by activity. She has tried nothing for the symptoms. There has been no history of extremity trauma.   HPI Comments: Meredith [name] is a 65 y.o. female with a hx of DM who presents to the Emergency Department complaining of constant moderate RLE pain that started yesterday. She describes the pain as located in her knees and ankles. She states that the pain is exacerbated by ambulation. She states that she is scheduled to get X-rays. She denies any recent trauma, hx of gout, fever, vomiting, abdominal pain, chest pain, SOB, or other complaints.     PCP: Beaulah Dinning, MD  Past Medical History:  Diagnosis Date  . ANEMIA, IRON DEFICIENCY, UNSPEC. 02/05/2007   Qualifier: Diagnosis of  By: Para March MD, Cheree Ditto    . CAD (coronary artery disease), native coronary artery 02/16/2015   Cath with 20% LCx and RCA  . Colon polyps   . Diabetes mellitus   . Diverticulosis 05/17/12  . DJD (degenerative joint disease) of lumbar spine   . Enteritis   . Gastric ulcer 04/2000  . Hyperlipidemia   . Hypertension   . Irregular heart beat   . Obesity   . Osteoarthritis   . Renal cyst, left 05/17/12  . Ventral hernia     Patient Active  Problem List   Diagnosis Date Noted  . Knee pain, chronic 07/21/2016  . CTS (carpal tunnel syndrome) 06/06/2016  . Numbness of hand 04/06/2016  . Estrogen deficiency 03/29/2016  . CAD (coronary artery disease), native coronary artery 10/26/2015  . Back pain 08/17/2015  . Atrial fibrillation (HCC) 02/23/2015  . PAC (premature atrial contraction) 01/19/2015  . Atrial tachycardia, paroxysmal (HCC) 01/19/2015  . Irregular heart rhythm 11/25/2014  . Healthcare maintenance 03/04/2014  . ASTHMA, INTERMITTENT 09/07/2010  . HERNIA, VENTRAL 04/18/2010  . 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 Diabetes mellitus type II, controlled, with no complications (HCC) 02/05/2007  . HYPERCHOLESTEROLEMIA 02/05/2007  . Obesity 02/05/2007  . DEPRESSION, MAJOR, RECURRENT 02/05/2007  . HYPERTENSION, BENIGN SYSTEMIC 02/05/2007  . GASTROESOPHAGEAL REFLUX, NO ESOPHAGITIS 02/05/2007  . OSTEOARTHRITIS, MULTI SITES 02/05/2007    Past Surgical History:  Procedure Laterality Date  . ABDOMINAL HYSTERECTOMY    . LEFT HEART CATHETERIZATION WITH CORONARY ANGIOGRAM N/A 02/16/2015   Procedure: LEFT HEART CATHETERIZATION WITH CORONARY ANGIOGRAM;  Surgeon: Kathleene Hazel, MD;  Location: [facility] CATH LAB;  Service: Cardiovascular;  Laterality: N/A;  . OPERATIVE HYSTEROSCOPY    . TUBAL LIGATION    . UTERINE FIBROID SURGERY      OB History    No data available       Home Medications    Prior to Admission medications   Medication Sig  Start Date End Date Taking? Authorizing Provider  amLODipine (NORVASC) 5 MG tablet TAKE 1 TABLET BY MOUTH EVERY DAY 05/07/16  Yes Beaulah Dinninghristina M Gambino, MD  ezetimibe (ZETIA) 10 MG tablet Take 1 tablet (10 mg total) by mouth daily. 11/28/15  Yes Quintella Reichertraci R Turner, MD  hydrochlorothiazide (MICROZIDE) 12.5 MG capsule TAKE ONE CAPSULE BY MOUTH EVERY DAY 07/29/16  Yes Beaulah Dinninghristina M Gambino, MD  KLOR-CON M20 20 MEQ tablet TAKE 1 TABLET (20 MEQ TOTAL) BY MOUTH DAILY. 10/08/16  Yes  Quintella Reichertraci R Turner, MD  metFORMIN (GLUCOPHAGE-XR) 500 MG 24 hr tablet TAKE 2 TABLETS BY MOUTH TWICE A DAY 09/18/16  Yes Beaulah Dinninghristina M Gambino, MD  metoprolol succinate (TOPROL-XL) 50 MG 24 hr tablet TAKE 1 & 1/2 TAB BY MOUTH EVERY DAY 08/17/16  Yes Beaulah Dinninghristina M Gambino, MD  rosuvastatin (CRESTOR) 20 MG tablet Take 1 tablet (20 mg total) by mouth daily. 01/25/16  Yes Quintella Reichertraci R Turner, MD  traMADol (ULTRAM) 50 MG tablet Take 1 tablet (50 mg total) by mouth every 6 (six) hours as needed. 10/10/16  Yes Renne Muscaaniel L Warden, MD  XARELTO 20 MG TABS tablet TAKE 1 TABLET BY MOUTH EVERY DAY WITH SUPPER 03/28/16  Yes Newman Niponna C Carroll, NP    Family History Family History  Problem Relation Age of Onset  . Diabetes Mother   . Kidney disease Mother   . Lung cancer Father   . Stomach cancer Sister   . Clotting disorder Other     neice  . Colon cancer Neg Hx     Social History Social History  Substance Use Topics  . Smoking status: Former Smoker    Types: Cigarettes    Quit date: 12/09/1978  . Smokeless tobacco: Never Used  . Alcohol use No     Allergies   Flexeril [cyclobenzaprine hcl]; Lipitor [atorvastatin calcium]; Lisinopril; and Metaxalone   Review of Systems Review of Systems  Constitutional: Negative for fever.  Respiratory: Negative for shortness of breath.   Cardiovascular: Negative for chest pain.  Gastrointestinal: Negative for abdominal pain and vomiting.  Musculoskeletal: Positive for myalgias (RLE).  Hematological: Bruises/bleeds easily.  All other systems reviewed and are negative.    Physical Exam Updated Vital Signs BP (!) 159/103 (BP Location: Left Arm)   Pulse 86   Temp 98.2 F (36.8 C) (Oral)   Resp 16   Ht 5\' 3"  (1.6 m)   Wt 246 lb (111.6 kg)   SpO2 97%   BMI 43.58 kg/m   Physical Exam  Nursing note and vitals reviewed.  CONSTITUTIONAL: Well developed/well nourished HEAD: Normocephalic/atraumatic EYES: EOMI ENMT: Mucous membranes moist NECK: supple no meningeal  signs SPINE/BACK:entire spine nontender CV: S1/S2 noted, no murmurs/rubs/gallops noted LUNGS: Lungs are clear to auscultation bilaterally, no apparent distress ABDOMEN: soft, nontender GU:no cva tenderness NEURO: Pt is awake/alert/appropriate, moves all extremitiesx4.  No facial droop.   EXTREMITIES: pulses normal/equal, full ROM, TTP and swelling to right ankle, no warmth or induration, no deformity, distal pulses intact, no calf tenderness noted SKIN: warm, color normal PSYCH: no abnormalities of mood noted, alert and oriented to situation   ED Treatments / Results  DIAGNOSTIC STUDIES: Oxygen Saturation is 97% on RA, normal by my interpretation.    COORDINATION OF CARE: 2:50 AM- Pt advised of plan for treatment and pt agrees.  Labs (all labs ordered are listed, but only abnormal results are displayed) Labs Reviewed  CBG MONITORING, ED    EKG  EKG Interpretation None  Radiology Dg Ankle Complete Right  Result Date: 10/16/2016 CLINICAL DATA:  Right ankle pain for 1 day.  No known injury. EXAM: RIGHT ANKLE - COMPLETE 3+ VIEW COMPARISON:  None. FINDINGS: Mild soft tissue swelling about the right ankle. Degenerative changes. Old ununited ossicles adjacent to the medial and lateral malleolus. Cartilage calcification in the joint space consistent with chondrocalcinosis. Degenerative changes in the visualized intertarsal joints. Small plantar calcaneal spur with calcifications over the plantar fascia suggesting plantar fasciitis. No acute displaced fractures identified. IMPRESSION: Degenerative changes in the right ankle. Plantar calcaneal spur with soft tissue calcifications suggesting plantar fasciitis. Electronically Signed   By: Burman NievesWilliam  Stevens M.D.   On: 10/16/2016 03:18    Procedures Procedures (including critical care time) SPLINT APPLICATION Date/Time: 4:34 AM Authorized by: Joya GaskinsWICKLINE,Eri Mcevers W Consent: Verbal consent obtained. Risks and benefits: risks, benefits and  alternatives were discussed Consent given by: patient Splint applied by: nurse Location details: right foot Splint type: post op shoe Supplies used: shoe Patient tolerance: Patient tolerated the procedure well with no immediate complications.    Medications Ordered in ED Medications  predniSONE (DELTASONE) tablet 60 mg (60 mg Oral Given 10/16/16 0320)     Initial Impression / Assessment and Plan / ED Course  I have reviewed the triage vital signs and the nursing notes.  Pertinent  imaging results that were available during my care of the patient were reviewed by me and considered in my medical decision making (see chart for details).  Clinical Course     Pt well appearing Xray negative She is currently being followed by PCP for arthralgias She may have some early gout in right ankle, but also has some component of plantar fascitis on xray Will place in post op shoe Short course of prednisone (advised to monitor glucose) She can continue followup with her PCP  no signs of septic joint at this time   Final Clinical Impressions(s) / ED Diagnoses   Final diagnoses:  Plantar fasciitis  Acute right ankle pain    New Prescriptions New Prescriptions   PREDNISONE (DELTASONE) 50 MG TABLET    One tablet PO daily for 4 days Please check your blood sugar frequently while taking this medicine   I personally performed the services described in this documentation, which was scribed in my presence. The recorded information has been reviewed and is accurate.        Zadie Rhineonald Jada Fass, MD 10/16/16 [id number]

## 2016-10-16 NOTE — ED Triage Notes (Signed)
Pt. reports right lower leg/right knee pain onset yesterday , denies injury /ambulatory using her cane .

## 2016-10-17 ENCOUNTER — Other Ambulatory Visit: Payer: Self-pay | Admitting: Family Medicine

## 2016-10-17 MED ORDER — GLUCOSE BLOOD VI STRP: ORAL_STRIP | 12 refills | Status: DC

## 2016-10-17 NOTE — Telephone Encounter (Signed)
Pt needs a refill on test strips. [facility] [address] Please advise. Thanks! ep

## 2016-10-22 ENCOUNTER — Other Ambulatory Visit: Payer: Self-pay | Admitting: Nurse Practitioner

## 2016-10-23 ENCOUNTER — Other Ambulatory Visit: Payer: Self-pay | Admitting: Family Medicine

## 2016-10-23 ENCOUNTER — Telehealth: Payer: Self-pay | Admitting: Family Medicine

## 2016-10-23 MED ORDER — GLUCOSE BLOOD VI STRP: ORAL_STRIP | 12 refills | Status: DC

## 2016-10-23 MED ORDER — CVS BLOOD GLUCOSE METER W/DEVICE KIT: PACK | 0 refills | Status: DC

## 2016-10-23 NOTE — Telephone Encounter (Signed)
Pharmacy called and would like us to re-fax or escribe the [facility] test [facility]. We sent this in on 10/17/16 but they never received this and she has medicare so it has to be fax or escribe.jw

## 2016-10-23 NOTE — Telephone Encounter (Signed)
Standardized DM supply form completed for Accu-Chek Aviva Plus Meter, Strips and Lancets.  Form signed by Dr. Randolm IdolFletke.  Clovis PuMartin, Tamika L, RN

## 2016-10-23 NOTE — Telephone Encounter (Signed)
Pt needs a glucose meter. [facility] [address] Please advise. Thanks! ep

## 2016-10-28 ENCOUNTER — Encounter: Payer: Self-pay | Admitting: Occupational Therapy

## 2016-10-28 NOTE — Therapy (Signed)
[facility] [address]  [place], [place], [address] Phone: [phone]   Fax:  [phone]  Patient Details  Name: Meredith [name] MRN: [id number] Date of Birth: July 05, 1951 Referring Provider:  Dr. Juanito Doom   Encounter Date: 10/28/2016   OCCUPATIONAL THERAPY DISCHARGE SUMMARY  Visits from Start of Care: 2  Current functional level related to goals / functional outcomes: Pt met no LTG's secondary to not returning after 2nd visit on 06/20/16   Remaining deficits: unknown   Education / Equipment: HEP   Plan: Patient agrees to discharge.  Patient goals were not met. Patient is being discharged due to not returning since the last visit.  ?????       Carey Bullocks, OTR/L 10/28/2016, 10:16 AM  [facility] [address]  [place], [place], [address] Phone: [phone]   Fax:  [phone]

## 2016-11-05 ENCOUNTER — Other Ambulatory Visit: Payer: Self-pay | Admitting: Cardiology

## 2016-11-05 NOTE — Telephone Encounter (Signed)
Pt saw Dr Myrtie SomanWarden on 10/10/2016 and Dr. Jonathon JordanGambino on 10/16/2016. Sunday SpillersSharon T Saunders, CMA

## 2016-11-07 ENCOUNTER — Ambulatory Visit: Payer: Commercial Managed Care - HMO | Admitting: Cardiology

## 2016-11-15 ENCOUNTER — Encounter: Payer: Self-pay | Admitting: Cardiology

## 2016-11-15 ENCOUNTER — Encounter (INDEPENDENT_AMBULATORY_CARE_PROVIDER_SITE_OTHER): Payer: Self-pay

## 2016-11-15 ENCOUNTER — Ambulatory Visit (INDEPENDENT_AMBULATORY_CARE_PROVIDER_SITE_OTHER): Payer: Commercial Managed Care - HMO | Admitting: Cardiology

## 2016-11-15 VITALS — BP 186/100 | HR 51 | Ht 63.0 in | Wt 247.0 lb

## 2016-11-15 DIAGNOSIS — I2510 Atherosclerotic heart disease of native coronary artery without angina pectoris: Secondary | ICD-10-CM

## 2016-11-15 DIAGNOSIS — I4819 Other persistent atrial fibrillation: Secondary | ICD-10-CM

## 2016-11-15 DIAGNOSIS — I481 Persistent atrial fibrillation: Secondary | ICD-10-CM | POA: Diagnosis not present

## 2016-11-15 DIAGNOSIS — E7800 Pure hypercholesterolemia, unspecified: Secondary | ICD-10-CM | POA: Diagnosis not present

## 2016-11-15 DIAGNOSIS — I10 Essential (primary) hypertension: Secondary | ICD-10-CM

## 2016-11-15 MED ORDER — AMLODIPINE BESYLATE 10 MG PO TABS: 10.0000 mg | ORAL_TABLET | Freq: Every day | ORAL | 3 refills | Status: DC

## 2016-11-15 NOTE — Patient Instructions (Signed)
Medication Instructions:  1) INCREASE AMLODIPINE to 10 mg daily  Labwork: Your physician recommends that you return for FASTING lab work.   Testing/Procedures: Dr. Mayford Knifeurner recommends you have an AMBULATORY BLOOD PRESSURE MONITOR.  Follow-Up: Your physician wants you to follow-up in: 6 months with Dr. Mayford Knifeurner. You will receive a reminder letter in the mail two months in advance. If you don't receive a letter, please call our office to schedule the follow-up appointment.   Any Other Special Instructions Will Be Listed Below (If Applicable).     If you need a refill on your cardiac medications before your next appointment, please call your pharmacy.

## 2016-11-15 NOTE — Progress Notes (Signed)
Cardiology Office Note    Date:  11/15/2016   ID:  Meredith [name], DOB 1951-03-09, MRN [id number]  PCP:  Carlyle Dolly, MD  Cardiologist:  Fransico Him, MD   Chief Complaint  Patient presents with  . Coronary Artery Disease  . Hypertension  . Hyperlipidemia    History of Present Illness:  Meredith [name] is a 65 y.o. female who presents for followup of atrial tachycardia, PAF on Xarelto, nonobstructive ASCAD with 20% LCx and RCA by cath, HTN and dyslipidemia. She presents back today for followup. She denies chest pressure, SOB, DOE, LE edema, palpitations, dizziness or syncope. She denies any claudication symptoms.  Past Medical History:  Diagnosis Date  . ANEMIA, IRON DEFICIENCY, UNSPEC. 02/05/2007   Qualifier: Diagnosis of  By: Damita Dunnings MD, Phillip Heal    . CAD (coronary artery disease), native coronary artery 02/16/2015   Cath with 20% LCx and RCA  . Colon polyps   . Diabetes mellitus   . Diverticulosis 05/17/12  . DJD (degenerative joint disease) of lumbar spine   . Enteritis   . Gastric ulcer 04/2000  . Hyperlipidemia   . Hypertension   . Irregular heart beat   . Obesity   . Osteoarthritis   . Persistent atrial fibrillation (HCC)    CHADS2VASC score is 5 and on Xarelto  . Renal cyst, left 05/17/12  . Ventral hernia     Past Surgical History:  Procedure Laterality Date  . ABDOMINAL HYSTERECTOMY    . LEFT HEART CATHETERIZATION WITH CORONARY ANGIOGRAM N/A 02/16/2015   Procedure: LEFT HEART CATHETERIZATION WITH CORONARY ANGIOGRAM;  Surgeon: Burnell Blanks, MD;  Location: [facility] CATH LAB;  Service: Cardiovascular;  Laterality: N/A;  . OPERATIVE HYSTEROSCOPY    . TUBAL LIGATION    . UTERINE FIBROID SURGERY      Current Medications: Outpatient Medications Prior to Visit  Medication Sig Dispense Refill  . amLODipine (NORVASC) 5 MG tablet TAKE 1 TABLET BY MOUTH EVERY DAY 90 tablet 3  . Blood Glucose Monitoring Suppl ([facility] BLOOD GLUCOSE METER) w/Device KIT As  Directed 1 kit 0  . glucose blood ([facility]S BLOOD GLUCOSE TEST STRIPS) test strip Use test blood glucose once daily. ICD-10 code: E11.9 100 each 12  . hydrochlorothiazide (MICROZIDE) 12.5 MG capsule TAKE ONE CAPSULE BY MOUTH EVERY DAY 30 capsule 5  . KLOR-CON M20 20 MEQ tablet TAKE 1 TABLET (20 MEQ TOTAL) BY MOUTH DAILY. 30 tablet 6  . metFORMIN (GLUCOPHAGE-XR) 500 MG 24 hr tablet TAKE 2 TABLETS BY MOUTH TWICE A DAY 180 tablet 3  . metoprolol succinate (TOPROL-XL) 50 MG 24 hr tablet TAKE 1 & 1/2 TAB BY MOUTH EVERY DAY 45 tablet 5  . rosuvastatin (CRESTOR) 20 MG tablet Take 1 tablet (20 mg total) by mouth daily. 30 tablet 11  . traMADol (ULTRAM) 50 MG tablet Take 1 tablet (50 mg total) by mouth every 6 (six) hours as needed. 30 tablet 0  . XARELTO 20 MG TABS tablet TAKE 1 TABLET BY MOUTH EVERY DAY WITH SUPPER 30 tablet 7  . ZETIA 10 MG tablet TAKE 1 TABLET BY MOUTH EVERY DAY 30 tablet 5  . predniSONE (DELTASONE) 50 MG tablet One tablet PO daily for 4 days Please check your blood sugar frequently while taking this medicine 4 tablet 0   No facility-administered medications prior to visit.      Allergies:   Flexeril [cyclobenzaprine hcl]; Lipitor [atorvastatin calcium]; Lisinopril; and Metaxalone   Social History   Social History  .  Marital status: Divorced    Spouse name: N/A  . Number of children: 4  . Years of education: N/A   Occupational History  . [employer]   Social History Main Topics  . Smoking status: Former Smoker    Types: Cigarettes    Quit date: 12/09/1978  . Smokeless tobacco: Never Used  . Alcohol use No  . Drug use: No  . Sexual activity: Not Currently   Other Topics Concern  . None   Social History Narrative  . None     Family History:  The patient's family history includes Clotting disorder in her other; Diabetes in her mother; Kidney disease in her mother; Lung cancer in her father; Stomach cancer in her sister.   ROS:   Please see the history of  present illness.    ROS All other systems reviewed and are negative.  No flowsheet data found.     PHYSICAL EXAM:   VS:  BP (!) 186/100   Pulse (!) 51 Comment: 51 by Pulse Ox, 50 by palp.  Ht 5' 3" (1.6 m)   Wt 247 lb (112 kg)   SpO2 99%   BMI 43.75 kg/m    GEN: Well nourished, well developed, in no acute distress  HEENT: normal  Neck: no JVD, carotid bruits, or masses Cardiac: RRR; no murmurs, rubs, or gallops,no edema.  Intact distal pulses bilaterally.  Respiratory:  clear to auscultation bilaterally, normal work of breathing GI: soft, nontender, nondistended, + BS MS: no deformity or atrophy  Skin: warm and dry, no rash Neuro:  Alert and Oriented x 3, Strength and sensation are intact Psych: euthymic mood, full affect  Wt Readings from Last 3 Encounters:  11/15/16 247 lb (112 kg)  10/16/16 246 lb (111.6 kg)  10/10/16 247 lb 9.6 oz (112.3 kg)      Studies/Labs Reviewed:   EKG:  EKG is not ordered today.   Recent Labs: 05/16/2016: ALT 9; BUN 19; Creat 0.77; Hemoglobin 12.5; Platelets 205; Potassium 3.9; Sodium 141   Lipid Panel    Component Value Date/Time   CHOL 131 05/16/2016 0739   TRIG 84 05/16/2016 0739   HDL 58 05/16/2016 0739   CHOLHDL 2.3 05/16/2016 0739   VLDL 17 05/16/2016 0739   LDLCALC 56 05/16/2016 0739   LDLDIRECT 130 (H) 05/13/2012 0917    Additional studies/ records that were reviewed today include:  none    ASSESSMENT:    1. Persistent atrial fibrillation ([place])   2. HYPERTENSION, BENIGN SYSTEMIC   3. Coronary artery disease involving native coronary artery of native heart without angina pectoris   4. HYPERCHOLESTEROLEMIA      PLAN:  In order of problems listed above:  1. Persistent atrial fibrillation - maintaining NSR.  Continue Xarelto and BB.  Check NOAC panel. 2. HTN - Bp poorly controlled today but she says that she has white coat HTN.  Continue diuretic and BB.  I will increase amlodipine to 7m daily and get a 24 hour BP  reading. 3. ASCAD - nonobstructive with 20% RCA and LCx.  She has no anginal symptoms. 4. Hyperlipidemia - LDL goal < 70.  Continue statin and Zetia.  Check FLp and ALT.     Medication Adjustments/Labs and Tests Ordered: Current medicines are reviewed at length with the patient today.  Concerns regarding medicines are outlined above.  Medication changes, Labs and Tests ordered today are listed in the Patient Instructions below.  There are no Patient Instructions on file  for this visit.   Signed, Fransico Him, MD  11/15/2016 10:20 AM    [facility] [place], [place], [place]  [address] Phone: [phone]; Fax: [phone]

## 2016-11-28 ENCOUNTER — Ambulatory Visit (INDEPENDENT_AMBULATORY_CARE_PROVIDER_SITE_OTHER): Payer: Commercial Managed Care - HMO | Admitting: Family Medicine

## 2016-11-28 ENCOUNTER — Encounter: Payer: Self-pay | Admitting: Family Medicine

## 2016-11-28 DIAGNOSIS — Z0000 Encounter for general adult medical examination without abnormal findings: Secondary | ICD-10-CM | POA: Diagnosis not present

## 2016-11-28 DIAGNOSIS — I10 Essential (primary) hypertension: Secondary | ICD-10-CM | POA: Diagnosis not present

## 2016-11-28 NOTE — Assessment & Plan Note (Signed)
Patient due for zoster vaccine as well as pneumonia vaccine. She declines having both of these at this time as she does not think the vaccines will help her. She is concerned that she needs a urine test for her general wellness, discussed with patient that she does not need this at this time. -Counseled patient on the benefits of vaccines, will respect her decision at this time to not receive suggested vaccines

## 2016-11-28 NOTE — Progress Notes (Signed)
   Subjective:    Patient ID: Meredith [name] , female   DOB: 1951/09/28 , 65 y.o..   MRN: [id number]  HPI  Meredith [name] is here for  Chief Complaint  Patient presents with  . general check up   Hypertension Blood pressure at home: Does not check typically Exercise: Minimal secondary to arthralgias Low salt diet: compliant Medications: Compliant with amlodipine 10 mg daily, hydrochlorothiazide 12.5 mg daily, metoprolol 75 mg daily Side effects:  None noted Denies headache, dizziness, visual changes, nausea, vomiting, chest pain, abdominal pain or shortness of breath. BP Readings from Last 3 Encounters:  11/28/16 130/82  11/15/16 (!) 186/100  10/16/16 138/76    Concerns for urine test:  Patient states that she received a letter in the mail possibly from her insurance company saying that she needs to provide a urine specimen. She was told that she may need to go to her doctor to have this urine test.  Health maintenance :  Patient states that she does not do vaccines. She thinks that the vaccines will make her sick. She did talk about the zoster vaccine and pneumonia vaccine previously but has refused both.   Review of Systems: Per HPI. All other systems reviewed and are negative.  Social Hx:  reports that she quit smoking about 37 years ago. Her smoking use included Cigarettes. She has never used smokeless tobacco.   Objective:   BP 130/82 (BP Location: Right Arm, Patient Position: Sitting, Cuff Size: Large)   Pulse (!) 44   Temp 97.7 F (36.5 C) (Oral)   Ht 5\' 3"  (1.6 m)   Wt 248 lb 3.2 oz (112.6 kg)   SpO2 99%   BMI 43.97 kg/m  Physical Exam  Gen: NAD, alert, cooperative with exam, well-appearing, obese Cardiac: Regular rate and rhythm, normal S1/S2, no murmur, no edema, capillary refill brisk  Respiratory: Clear to auscultation bilaterally, no wheezes, non-labored Gastrointestinal: soft, non tender, non distended, bowel sounds present Skin: no rashes, normal  turgor  Neurological: no gross deficits. Antalgic gait Psych: good insight, normal mood and affect   Assessment & Plan:  HYPERTENSION, BENIGN SYSTEMIC Controlled. At goal. Blood pressure 130/82 today. -Continue current medication regimen -Patient is following up with cardiology, patient states that she will be having a 24-hour blood pressure monitor in the near future  Healthcare maintenance Patient due for zoster vaccine as well as pneumonia vaccine. She declines having both of these at this time as she does not think the vaccines will help her. She is concerned that she needs a urine test for her general wellness, discussed with patient that she does not need this at this time. -Counseled patient on the benefits of vaccines, will respect her decision at this time to not receive suggested vaccines   Anders Simmondshristina Gambino, MD [facility], PGY-2

## 2016-11-28 NOTE — Assessment & Plan Note (Signed)
Controlled. At goal. Blood pressure 130/82 today. -Continue current medication regimen -Patient is following up with cardiology, patient states that she will be having a 24-hour blood pressure monitor in the near future

## 2016-12-20 ENCOUNTER — Other Ambulatory Visit: Payer: Medicare HMO | Admitting: *Deleted

## 2016-12-20 ENCOUNTER — Ambulatory Visit (INDEPENDENT_AMBULATORY_CARE_PROVIDER_SITE_OTHER): Payer: Medicare HMO

## 2016-12-20 DIAGNOSIS — E7800 Pure hypercholesterolemia, unspecified: Secondary | ICD-10-CM

## 2016-12-20 DIAGNOSIS — I10 Essential (primary) hypertension: Secondary | ICD-10-CM | POA: Diagnosis not present

## 2016-12-20 DIAGNOSIS — I4819 Other persistent atrial fibrillation: Secondary | ICD-10-CM

## 2016-12-20 DIAGNOSIS — I481 Persistent atrial fibrillation: Secondary | ICD-10-CM | POA: Diagnosis not present

## 2016-12-21 LAB — CBC WITH DIFFERENTIAL/PLATELET
BASOS: 0 %
Basophils Absolute: 0 10*3/uL (ref 0.0–0.2)
EOS (ABSOLUTE): 0.1 10*3/uL (ref 0.0–0.4)
EOS: 2 %
HEMATOCRIT: 36.5 % (ref 34.0–46.6)
Hemoglobin: 12.4 g/dL (ref 11.1–15.9)
Immature Grans (Abs): 0 10*3/uL (ref 0.0–0.1)
Immature Granulocytes: 0 %
LYMPHS ABS: 1.8 10*3/uL (ref 0.7–3.1)
Lymphs: 33 %
MCH: 26.1 pg — AB (ref 26.6–33.0)
MCHC: 34 g/dL (ref 31.5–35.7)
MCV: 77 fL — AB (ref 79–97)
MONOS ABS: 0.3 10*3/uL (ref 0.1–0.9)
Monocytes: 5 %
NEUTROS ABS: 3.3 10*3/uL (ref 1.4–7.0)
NEUTROS PCT: 60 %
PLATELETS: 208 10*3/uL (ref 150–379)
RBC: 4.75 x10E6/uL (ref 3.77–5.28)
RDW: 15.1 % (ref 12.3–15.4)
WBC: 5.5 10*3/uL (ref 3.4–10.8)

## 2016-12-21 LAB — LIPID PANEL
CHOL/HDL RATIO: 2.6 ratio (ref 0.0–4.4)
Cholesterol, Total: 149 mg/dL (ref 100–199)
HDL: 58 mg/dL (ref >39)
LDL Calculated: 76 mg/dL (ref 0–99)
TRIGLYCERIDES: 76 mg/dL (ref 0–149)
VLDL CHOLESTEROL CAL: 15 mg/dL (ref 5–40)

## 2016-12-21 LAB — BASIC METABOLIC PANEL
BUN/Creatinine Ratio: 18 (ref 12–28)
BUN: 12 mg/dL (ref 8–27)
CALCIUM: 9.7 mg/dL (ref 8.7–10.3)
CO2: 25 mmol/L (ref 18–29)
Chloride: 100 mmol/L (ref 96–106)
Creatinine, Ser: 0.67 mg/dL (ref 0.57–1.00)
GFR, EST AFRICAN AMERICAN: 106 mL/min/{1.73_m2} (ref >59)
GFR, EST NON AFRICAN AMERICAN: 92 mL/min/{1.73_m2} (ref >59)
Glucose: 147 mg/dL — ABNORMAL HIGH (ref 65–99)
POTASSIUM: 3.9 mmol/L (ref 3.5–5.2)
Sodium: 140 mmol/L (ref 134–144)

## 2016-12-21 LAB — HEPATIC FUNCTION PANEL
ALK PHOS: 52 IU/L (ref 39–117)
ALT: 10 IU/L (ref 0–32)
AST: 10 IU/L (ref 0–40)
Albumin: 4.1 g/dL (ref 3.6–4.8)
BILIRUBIN, DIRECT: 0.13 mg/dL (ref 0.00–0.40)
Bilirubin Total: 0.3 mg/dL (ref 0.0–1.2)
Total Protein: 6.9 g/dL (ref 6.0–8.5)

## 2016-12-27 ENCOUNTER — Telehealth: Payer: Self-pay

## 2016-12-27 NOTE — Telephone Encounter (Signed)
Ambulatory blood pressure monitor showed average BP 135/74 and HR 71. Per Dr. Mayford Knifeurner, informed patient that BP is OK and there will be no changes. She was grateful for call.

## 2017-01-12 ENCOUNTER — Other Ambulatory Visit: Payer: Self-pay | Admitting: Cardiology

## 2017-01-22 ENCOUNTER — Other Ambulatory Visit: Payer: Self-pay | Admitting: Family Medicine

## 2017-01-30 ENCOUNTER — Telehealth: Payer: Self-pay | Admitting: Family Medicine

## 2017-01-30 NOTE — Telephone Encounter (Signed)
Pt states [employer] will no longer pay for Zetia and pt doesn't understand why because she has been taking it. Is there something similar she can take? [employer] also sent pt a letter telling her that she needed to take a kidney test, which worried pt. Pt would like PCP to call her regarding this. ep

## 2017-01-31 ENCOUNTER — Telehealth: Payer: Self-pay

## 2017-01-31 ENCOUNTER — Telehealth: Payer: Self-pay | Admitting: Family Medicine

## 2017-01-31 NOTE — Telephone Encounter (Signed)
PA for Zetia has been sent in through covermymeds. Awaiting response.

## 2017-01-31 NOTE — Telephone Encounter (Signed)
Pt would call back when she knows her availability. - Meredith [name]

## 2017-02-04 MED ORDER — EZETIMIBE 10 MG PO TABS: 10.0000 mg | ORAL_TABLET | Freq: Every day | ORAL | 3 refills | Status: DC

## 2017-02-04 NOTE — Telephone Encounter (Signed)
Returned patient's call. Switched from Zetia to generic Ezetimibe, sent new prescription to pharmacy. Also explained to patient that her kidney's looked good on her most recent labs last month, no need to recheck. She expressed good understanding and was appreciative of the call.   Anders Simmondshristina Gambino, MD [facility], PGY-2

## 2017-02-06 NOTE — Telephone Encounter (Signed)
I received PA approval from [employer] stating that the pts Zetia has been approved until 02/01/2019. I [id number].

## 2017-02-10 ENCOUNTER — Other Ambulatory Visit: Payer: Self-pay | Admitting: Family Medicine

## 2017-02-13 NOTE — Progress Notes (Signed)
Subjective:    Patient ID: Meredith [name] , female   DOB: [birth year] , 66 y.o..   MRN: [id number]  HPI  [name] is here for  Chief Complaint  Patient presents with  . Diabetes    1. Hypertension Follows with Dr. Mayford Knife her cardiologist for HTN, CAD, and afib Blood pressure at home: Does not check  Exercise: None Low salt diet: Sometimes compliant  Medications: Compliant with HCTZ 12.5mg  daily, Metoprolol 75mg  daily, amlodpine 10mg  daily Side effects: none ROS: Denies headache, dizziness, visual changes, nausea, vomiting, chest pain, abdominal pain or shortness of breath. BP Readings from Last 3 Encounters:  02/14/17 132/88  11/28/16 130/82  11/15/16 (!) 186/100   2. Chronic Diabetes Disease Monitoring  Blood Sugar Ranges: Does not check   Polyuria: no   Visual problems: no   Last hemoglobin A1C:  Lab Results  Component Value Date   HGBA1C 7.9 02/14/2017   Medication Compliance: yes, Metofmrin 1000 mg BID  Medication Side Effects  Hypoglycemia: no   Preventitive Health Care  Eye Exam: up to date   Foot Exam: up to date  Diet pattern: decreasing carbs  Exercise: None  3. Bilateral Knee Pain: Is currently taking Tramadol PRN for her pain. She takes about 2 a day. She is trying not be take too many because she thinks it may make her sleepy. She notes that she is very busy right now and can't afford to see the orthopedic doctor. Has had knee injections before, it helped but she did not like it and does not desire anymore. Hurts most with prolonged standing and walking. Sometimes she will need a cane.  Review of Systems: Per HPI. All other systems reviewed and are negative.  Social Hx:  reports that she quit smoking about 38 years ago. Her smoking use included Cigarettes. She has never used smokeless tobacco.   Objective:   BP 132/88   Pulse 71   Temp 98.4 F (36.9 C) (Oral)   Ht 5\' 3"  (1.6 m)   Wt 253 lb (114.8 kg)   SpO2 97%   BMI 44.82 kg/m    Physical Exam  Gen: NAD, alert, cooperative with exam, well-appearing, Obese Cardiac: Regular rate and rhythm, normal S1/S2, no edema, capillary refill brisk  Respiratory: Clear to auscultation bilaterally, no wheezes, non-labored breathing Skin: no rashes, normal turgor  Psych: good insight, normal mood and affect  Assessment & Plan:  HYPERTENSION, BENIGN SYSTEMIC Controlled medical -Continue current medication regimen -Follows with cardiology for A. fib, CAD, and hypertension; will follow up with me again in 3 months  Knee pain, chronic Uncontrolled pain likely from osteoarthritis. Is able to ambulate but this is painful for her. Taking tramadol as prescribed but this only provides temporary relief. Has tried injections in the past and stated they helped for a short amount of time but she is not interested in any injections in the future. Knee x-rays from April 2015 showing almost complete loss of joint space particularly in the medial compartment.  Discussed reimaging knees and possibly sending to orthopedics as she may need a repeat knee replacements. Patient not agreeable to this at this time as she has a lot going on her personal life. Also would like to defer physical therapy to a later date.  -Refilled tramadol -Avoid NSAIDs due to CAD -Recommending orthopedic referral in the future when patient is agreeable  Diabetes mellitus type II, controlled, with no complications (HCC) Uncontrolled. Hemoglobin A1c increased today to 7.9  from 6.9. Likely due to uncontrolled diet and no exercise secondary to bilateral knee pain. -Discussed diabetic diet and referral to nutrition. Patient not agreeable to nutrition referral at this time -Continue metformin 1000 mg twice a day -Follow-up in 3 months  Orders Placed This Encounter  Procedures  . POCT glycosylated hemoglobin (Hb A1C)   Meds ordered this encounter  Medications  . traMADol (ULTRAM) 50 MG tablet    Sig: Take 1 tablet (50 mg  total) by mouth every 6 (six) hours as needed.    Dispense:  30 tablet    Refill:  0    Anders Simmondshristina Weronika Birch, MD [facility], PGY-2

## 2017-02-14 ENCOUNTER — Encounter: Payer: Self-pay | Admitting: Family Medicine

## 2017-02-14 ENCOUNTER — Ambulatory Visit (INDEPENDENT_AMBULATORY_CARE_PROVIDER_SITE_OTHER): Payer: Medicare HMO | Admitting: Family Medicine

## 2017-02-14 VITALS — BP 132/88 | HR 71 | Temp 98.4°F | Ht 63.0 in | Wt 253.0 lb

## 2017-02-14 DIAGNOSIS — E119 Type 2 diabetes mellitus without complications: Secondary | ICD-10-CM | POA: Diagnosis not present

## 2017-02-14 DIAGNOSIS — I10 Essential (primary) hypertension: Secondary | ICD-10-CM

## 2017-02-14 DIAGNOSIS — G8929 Other chronic pain: Secondary | ICD-10-CM | POA: Diagnosis not present

## 2017-02-14 DIAGNOSIS — M25561 Pain in right knee: Secondary | ICD-10-CM | POA: Diagnosis not present

## 2017-02-14 DIAGNOSIS — M25562 Pain in left knee: Secondary | ICD-10-CM

## 2017-02-14 LAB — POCT GLYCOSYLATED HEMOGLOBIN (HGB A1C): HEMOGLOBIN A1C: 7.9

## 2017-02-14 MED ORDER — TRAMADOL HCL 50 MG PO TABS: 50.0000 mg | ORAL_TABLET | Freq: Four times a day (QID) | ORAL | 0 refills | Status: DC | PRN

## 2017-02-14 NOTE — Assessment & Plan Note (Signed)
Controlled medical -Continue current medication regimen -Follows with cardiology for A. fib, CAD, and hypertension; will follow up with me again in 3 months

## 2017-02-14 NOTE — Assessment & Plan Note (Signed)
Uncontrolled. Hemoglobin A1c increased today to 7.9 from 6.9. Likely due to uncontrolled diet and no exercise secondary to bilateral knee pain. -Discussed diabetic diet and referral to nutrition. Patient not agreeable to nutrition referral at this time -Continue metformin 1000 mg twice a day -Follow-up in 3 months

## 2017-02-14 NOTE — Patient Instructions (Signed)
Thank you for coming in today, it was so nice to see you! Today we talked about:    Blood pressure: Your blood pressure looks good! [facility] Your goal pressure is for the top number to under 150 and for the bottom number to be under 90.   Diabetes: Your hemoglobin A1C is higher than before. Please continue decreasing carbohydrates from your diet  Knee pain: You can continue taking Tramadol as prescribed. I will give you a refill today.   Please follow up in 3 months. You can schedule this appointment at the front desk before you leave or call the clinic.  Bring in all your medications or supplements to each appointment for review.   If we ordered any tests today, you will be notified via telephone of any abnormalities. If everything is normal you will get a letter in the mail.   If you have any questions or concerns, please do not hesitate to call the office at [phone]. You can also message me directly via MyChart.   Sincerely,  Anders Simmondshristina Gambino, MD

## 2017-02-14 NOTE — Assessment & Plan Note (Signed)
Uncontrolled pain likely from osteoarthritis. Is able to ambulate but this is painful for her. Taking tramadol as prescribed but this only provides temporary relief. Has tried injections in the past and stated they helped for a short amount of time but she is not interested in any injections in the future. Knee x-rays from April 2015 showing almost complete loss of joint space particularly in the medial compartment.  Discussed reimaging knees and possibly sending to orthopedics as she may need a repeat knee replacements. Patient not agreeable to this at this time as she has a lot going on her personal life. Also would like to defer physical therapy to a later date.  -Refilled tramadol -Avoid NSAIDs due to CAD -Recommending orthopedic referral in the future when patient is agreeable

## 2017-03-13 ENCOUNTER — Ambulatory Visit (INDEPENDENT_AMBULATORY_CARE_PROVIDER_SITE_OTHER): Payer: Medicare HMO | Admitting: Family Medicine

## 2017-03-13 ENCOUNTER — Encounter: Payer: Self-pay | Admitting: Family Medicine

## 2017-03-13 VITALS — BP 140/82 | HR 77 | Temp 97.9°F | Wt 257.0 lb

## 2017-03-13 DIAGNOSIS — H1132 Conjunctival hemorrhage, left eye: Secondary | ICD-10-CM

## 2017-03-13 NOTE — Progress Notes (Signed)
Subjective:     Patient ID: Meredith [name], female   DOB: 1951-08-11, 66 y.o.   MRN: [id number]  HPI Meredith [name] is a 66yo presenting today for redness of left eye. Noted since Tuesday 4/3. Felt vessel in eye pop and then redness spread. Very mild pain, not worse with movement of eye. Denies changes in vision. Denies discharge or matting of eye. Reports this is similar to prior symptoms, occurring about once a year for the last three years. Denies sick contacts. Denies history of allergies. Does have history of hypertension, diabetes, atrial fibrillation currently on xarelto. Former smoker.  Review of Systems Per HPI    Objective:   Physical Exam  Constitutional: She appears well-developed and well-nourished. No distress.  Eyes:  Redness of left eye sparing upper medial eye. Apparent remnants of burst blood vessel along lateral left eye. Spares pupil. No increased pain with movement of eye. No signs of foreign body. PERRLA. Vision 20/20 both, 20/50 right, 20/40 left.  Psychiatric: She has a normal mood and affect. Her behavior is normal.      Assessment and Plan:      1. Subconjunctival hemorrhage, left Watchful waiting. To notify office if does not resolve in 2-3 weeks. Return precautions given.

## 2017-03-13 NOTE — Patient Instructions (Signed)

## 2017-03-15 ENCOUNTER — Other Ambulatory Visit: Payer: Self-pay | Admitting: Family Medicine

## 2017-04-10 ENCOUNTER — Telehealth: Payer: Self-pay | Admitting: Family Medicine

## 2017-04-10 NOTE — Telephone Encounter (Signed)
Disability Parking Placard form dropped off for at front desk for completion.  Verified that patient section of form has been completed.  Last DOS/WCC with PCP was 02/14/17.  Placed form in white team folder to be completed by clinical staff.  Lina Sarheryl A Stanley

## 2017-04-14 NOTE — Telephone Encounter (Signed)
Clinical info completed on disability parking placard form.  Place form put in Dr Pennie RushingGambino's box for completion.  Sunday SpillersSharon T Danton Palmateer, CMA

## 2017-04-16 NOTE — Telephone Encounter (Signed)
Pt advised placard form was completed and ready for pickup.  Clovis PuMartin, Tamika L, RN

## 2017-04-17 ENCOUNTER — Telehealth: Payer: Self-pay | Admitting: Family Medicine

## 2017-04-17 NOTE — Telephone Encounter (Addendum)
Received form, completed and signed and placed in Meredith [name]'s box

## 2017-04-17 NOTE — Telephone Encounter (Signed)
[employer] placard form dropped off for at front desk for completion. (per form one needs to be circled on 1 - 3 Verified that patient section of form has been completed.    Placed form in white team folder to be completed by clinical staff.  Lina Sarheryl A Stanley

## 2017-04-21 NOTE — Telephone Encounter (Signed)
Clinical info completed on Parking Placard form.  Place form in Dr. Pennie RushingGambino's box for completion.  Sunday SpillersSharon T Saunders, CMA

## 2017-04-22 NOTE — Telephone Encounter (Signed)
Patient informed that placard form is complete and ready for pick up.  Albert Devaul L, RN  

## 2017-04-22 NOTE — Telephone Encounter (Signed)
Completed and placed in Tamika's box. Thank you.

## 2017-05-04 ENCOUNTER — Other Ambulatory Visit: Payer: Self-pay | Admitting: Cardiology

## 2017-05-15 ENCOUNTER — Other Ambulatory Visit: Payer: Self-pay | Admitting: *Deleted

## 2017-05-15 NOTE — Telephone Encounter (Signed)
Refill request for 90 day supply.  Lashayla Armes L, RN  

## 2017-05-16 ENCOUNTER — Ambulatory Visit (INDEPENDENT_AMBULATORY_CARE_PROVIDER_SITE_OTHER): Payer: Medicare HMO | Admitting: Cardiology

## 2017-05-16 ENCOUNTER — Encounter: Payer: Self-pay | Admitting: Cardiology

## 2017-05-16 VITALS — BP 160/94 | HR 68 | Ht 63.0 in | Wt 255.8 lb

## 2017-05-16 DIAGNOSIS — E7800 Pure hypercholesterolemia, unspecified: Secondary | ICD-10-CM | POA: Diagnosis not present

## 2017-05-16 DIAGNOSIS — I481 Persistent atrial fibrillation: Secondary | ICD-10-CM

## 2017-05-16 DIAGNOSIS — I4819 Other persistent atrial fibrillation: Secondary | ICD-10-CM

## 2017-05-16 DIAGNOSIS — I2510 Atherosclerotic heart disease of native coronary artery without angina pectoris: Secondary | ICD-10-CM

## 2017-05-16 DIAGNOSIS — I10 Essential (primary) hypertension: Secondary | ICD-10-CM | POA: Diagnosis not present

## 2017-05-16 LAB — BASIC METABOLIC PANEL WITH GFR
BUN/Creatinine Ratio: 26 (ref 12–28)
BUN: 20 mg/dL (ref 8–27)
CO2: 26 mmol/L (ref 18–29)
Calcium: 10.1 mg/dL (ref 8.7–10.3)
Chloride: 100 mmol/L (ref 96–106)
Creatinine, Ser: 0.77 mg/dL (ref 0.57–1.00)
GFR calc Af Amer: 92 mL/min/1.73 (ref >59)
GFR calc non Af Amer: 80 mL/min/1.73 (ref >59)
Glucose: 127 mg/dL — ABNORMAL HIGH (ref 65–99)
Potassium: 4 mmol/L (ref 3.5–5.2)
Sodium: 142 mmol/L (ref 134–144)

## 2017-05-16 LAB — CBC WITH DIFFERENTIAL/PLATELET
Basophils Absolute: 0 x10E3/uL (ref 0.0–0.2)
Basos: 0 %
EOS (ABSOLUTE): 0.2 x10E3/uL (ref 0.0–0.4)
Eos: 2 %
Hematocrit: 37.4 % (ref 34.0–46.6)
Hemoglobin: 12.7 g/dL (ref 11.1–15.9)
Immature Grans (Abs): 0 x10E3/uL (ref 0.0–0.1)
Immature Granulocytes: 0 %
Lymphocytes Absolute: 2.6 x10E3/uL (ref 0.7–3.1)
Lymphs: 38 %
MCH: 25.6 pg — ABNORMAL LOW (ref 26.6–33.0)
MCHC: 34 g/dL (ref 31.5–35.7)
MCV: 75 fL — ABNORMAL LOW (ref 79–97)
Monocytes Absolute: 0.4 x10E3/uL (ref 0.1–0.9)
Monocytes: 6 %
Neutrophils Absolute: 3.7 x10E3/uL (ref 1.4–7.0)
Neutrophils: 54 %
Platelets: 207 x10E3/uL (ref 150–379)
RBC: 4.96 x10E6/uL (ref 3.77–5.28)
RDW: 14.8 % (ref 12.3–15.4)
WBC: 6.9 x10E3/uL (ref 3.4–10.8)

## 2017-05-16 LAB — HEPATIC FUNCTION PANEL
ALBUMIN: 4.2 g/dL (ref 3.6–4.8)
ALT: 11 IU/L (ref 0–32)
AST: 11 IU/L (ref 0–40)
Alkaline Phosphatase: 54 IU/L (ref 39–117)
Bilirubin Total: 0.3 mg/dL (ref 0.0–1.2)
Bilirubin, Direct: 0.13 mg/dL (ref 0.00–0.40)
Total Protein: 7.1 g/dL (ref 6.0–8.5)

## 2017-05-16 LAB — LIPID PANEL
Chol/HDL Ratio: 2.4 ratio (ref 0.0–4.4)
Cholesterol, Total: 142 mg/dL (ref 100–199)
HDL: 58 mg/dL (ref >39)
LDL Calculated: 69 mg/dL (ref 0–99)
TRIGLYCERIDES: 73 mg/dL (ref 0–149)
VLDL CHOLESTEROL CAL: 15 mg/dL (ref 5–40)

## 2017-05-16 MED ORDER — HYDROCHLOROTHIAZIDE 25 MG PO TABS: 25.0000 mg | ORAL_TABLET | Freq: Every day | ORAL | 3 refills | Status: DC

## 2017-05-16 MED ORDER — METOPROLOL SUCCINATE ER 50 MG PO TB24: ORAL_TABLET | ORAL | 0 refills | Status: DC

## 2017-05-16 NOTE — Patient Instructions (Signed)
Medication Instructions:  1) INCREASE HYDROCHLOROTHIAZIDE to 25 mg daily  Labwork: TODAY: BMET, CBC, LFTs, Lipids  Testing/Procedures: None  Follow-Up: Your physician wants you to follow-up in: 6 months with Dr. Mayford Knifeurner. You will receive a reminder letter in the mail two months in advance. If you don't receive a letter, please call our office to schedule the follow-up appointment.   Any Other Special Instructions Will Be Listed Below (If Applicable).     If you need a refill on your cardiac medications before your next appointment, please call your pharmacy.

## 2017-05-16 NOTE — Progress Notes (Signed)
Cardiology Office Note    Date:  05/16/2017   ID:  [name], DOB 10/28/1951, MRN [id number]  PCP:  Meredith Dolly, MD  Cardiologist:  Meredith Him, MD   Chief Complaint  Patient presents with  . Coronary Artery Disease  . Hypertension  . Hyperlipidemia    History of Present Illness:  Meredith [name] is a 66 y.o. female who presents for followup of atrial tachycardia, persistent AF on Xarelto, nonobstructive ASCAD with 20% LCx and RCA by cath, HTN and dyslipidemia. She presents back today for followup and is doing well. She denies chest pressure or pain, SOB, DOE, PND, orthopnea,  palpitations, dizziness or syncope. She denies any claudication symptoms.She has noticed increased LE edema recently.   Past Medical History:  Diagnosis Date  . ANEMIA, IRON DEFICIENCY, UNSPEC. 02/05/2007   Qualifier: Diagnosis of  By: Meredith Dunnings MD, Meredith [name]    . CAD (coronary artery disease), native coronary artery 02/16/2015   Cath with 20% LCx and RCA  . Colon polyps   . Diabetes mellitus   . Diverticulosis 05/17/12  . DJD (degenerative joint disease) of lumbar spine   . Enteritis   . Gastric ulcer 04/2000  . Hyperlipidemia   . Hypertension   . Irregular heart beat   . Obesity   . Osteoarthritis   . Persistent atrial fibrillation (HCC)    CHADS2VASC score is 5 and on Xarelto  . Renal cyst, left 05/17/12  . Ventral hernia     Past Surgical History:  Procedure Laterality Date  . ABDOMINAL HYSTERECTOMY    . LEFT HEART CATHETERIZATION WITH CORONARY ANGIOGRAM N/A 02/16/2015   Procedure: LEFT HEART CATHETERIZATION WITH CORONARY ANGIOGRAM;  Surgeon: Meredith Blanks, MD;  Location: [facility] CATH LAB;  Service: Cardiovascular;  Laterality: N/A;  . OPERATIVE HYSTEROSCOPY    . TUBAL LIGATION    . UTERINE FIBROID SURGERY      Current Medications: Current Meds  Medication Sig  . amLODipine (NORVASC) 10 MG tablet Take 1 tablet (10 mg total) by mouth daily.  . Blood Glucose Monitoring  Suppl ([facility] BLOOD GLUCOSE METER) w/Device KIT As Directed  . ezetimibe (ZETIA) 10 MG tablet Take 1 tablet (10 mg total) by mouth daily.  Marland Kitchen glucose blood ([facility] BLOOD GLUCOSE TEST STRIPS) test strip Use test blood glucose once daily. ICD-10 code: E11.9  . hydrochlorothiazide (MICROZIDE) 12.5 MG capsule TAKE ONE CAPSULE BY MOUTH EVERY DAY  . KLOR-CON M20 20 MEQ tablet TAKE 1 TABLET (20 MEQ TOTAL) BY MOUTH DAILY.  . metFORMIN (GLUCOPHAGE-XR) 500 MG 24 hr tablet TAKE 2 TABLETS BY MOUTH TWICE A DAY  . metoprolol succinate (TOPROL-XL) 50 MG 24 hr tablet TAKE 1 & 1/2 TAB BY MOUTH EVERY DAY  . rosuvastatin (CRESTOR) 20 MG tablet TAKE 1 TABLET BY MOUTH EVERY DAY  . traMADol (ULTRAM) 50 MG tablet Take 1 tablet (50 mg total) by mouth every 6 (six) hours as needed.  Alveda Reasons 20 MG TABS tablet TAKE 1 TABLET BY MOUTH EVERY DAY WITH SUPPER    Allergies:   Flexeril [cyclobenzaprine hcl]; Lipitor [atorvastatin calcium]; Lisinopril; and Metaxalone   Social History   Social History  . Marital status: Divorced    Spouse name: N/A  . Number of children: 4  . Years of education: N/A   Occupational History  . [employer]   Social History Main Topics  . Smoking status: Former Smoker    Types: Cigarettes    Quit date: 12/09/1978  .  Smokeless tobacco: Never Used  . Alcohol use No  . Drug use: No  . Sexual activity: Not Currently   Other Topics Concern  . None   Social History Narrative  . None     Family History:  The patient's family history includes Clotting disorder in her other; Diabetes in her mother; Kidney disease in her mother; Lung cancer in her father; Stomach cancer in her sister.   ROS:   Please see the history of present illness.    Review of Systems  Cardiovascular: Positive for leg swelling.  Musculoskeletal: Positive for joint pain and muscle cramps.  Gastrointestinal: Positive for abdominal pain and constipation.  Psychiatric/Behavioral: Positive for depression.   All  other systems reviewed and are negative.  No flowsheet data found.     PHYSICAL EXAM:   VS:  BP (!) 160/94   Pulse 68   Ht _0  (1.6 m)   Wt 255 lb 12.8 oz (116 kg)   SpO2 98%   BMI 45.31 kg/m    GEN: Well nourished, well developed, in no acute distress  HEENT: normal  Neck: no JVD, carotid bruits, or masses Cardiac: RRR; no murmurs, rubs, or gallops.  Trace edema of RLE.  Intact distal pulses bilaterally.  Respiratory:  clear to auscultation bilaterally, normal work of breathing GI: soft, nontender, nondistended, + BS MS: no deformity or atrophy  Skin: warm and dry, no rash Neuro:  Alert and Oriented x 3, Strength and sensation are intact Psych: euthymic mood, full affect  Wt Readings from Last 3 Encounters:  05/16/17 255 lb 12.8 oz (116 kg)  03/13/17 257 lb (116.6 kg)  02/14/17 253 lb (114.8 kg)      Studies/Labs Reviewed:   EKG:  EKG is ordered today.  The ekg ordered today demonstrates NSR at 65bpm with no ST changes and septal infarct  Recent Labs: 12/20/2016: ALT 10; BUN 12; Creatinine, Ser 0.67; Hemoglobin 12.4; Platelets 208; Potassium 3.9; Sodium 140   Lipid Panel    Component Value Date/Time   CHOL 149 12/20/2016 0819   TRIG 76 12/20/2016 0819   HDL 58 12/20/2016 0819   CHOLHDL 2.6 12/20/2016 0819   CHOLHDL 2.3 05/16/2016 0739   VLDL 17 05/16/2016 0739   LDLCALC 76 12/20/2016 0819   LDLDIRECT 130 (H) 05/13/2012 0917    Additional studies/ records that were reviewed today include:  none    ASSESSMENT:    1. Persistent atrial fibrillation ([place])   2. Coronary artery disease involving native coronary artery of native heart without angina pectoris   3. HYPERTENSION, BENIGN SYSTEMIC   4. HYPERCHOLESTEROLEMIA      PLAN:  In order of problems listed above:  1. Persistent atrial fibrillation - she is maintaining NSR.  She will continue on Toprol and Xarelto.  I will check a NOAC panel. 2. ASCAD - nonobstructive ASCAD with 20% LCx and RCA - she  has no angina symptoms.  She will continue on statin.  No ASA due to NOAC. 3. HTN - her BP is borderline controlled on exam today but she did not take her BP meds yet.  She will continue on amldopine 10m daily,Toprol XL 781mdaily.  I will give her a Rx for an Omron BP cuff and have asked her to check her BP daily for a week and call with results.  She is having some mild LE edema so I will increase her HCTZ to 2546maily. 4. Hyperlipidemia with LDL goal < 70.  She  will continue on Crestor 29m daily.  Her last LDL was 76.  I will repeat an FLP and ALT.     Medication Adjustments/Labs and Tests Ordered: Current medicines are reviewed at length with the patient today.  Concerns regarding medicines are outlined above.  Medication changes, Labs and Tests ordered today are listed in the Patient Instructions below.  There are no Patient Instructions on file for this visit.   Signed, TFransico Him MD  05/16/2017 8:59 AM    [facility] [address]   [id number]: [phone] Fax: [phone]

## 2017-05-19 ENCOUNTER — Telehealth: Payer: Self-pay | Admitting: Cardiology

## 2017-05-19 NOTE — Telephone Encounter (Signed)
New message     Pt returning your call about lab results , she is at work she will call you back if she misses you

## 2017-05-19 NOTE — Telephone Encounter (Signed)
-----   Message from Quintella Reichertraci R Turner, MD sent at 05/18/2017 10:28 PM EDT ----- Stable labs - continue current meds

## 2017-05-19 NOTE — Telephone Encounter (Signed)
Informed patient of results and verbal understanding expressed.  

## 2017-06-10 ENCOUNTER — Other Ambulatory Visit: Payer: Self-pay | Admitting: Family Medicine

## 2017-06-12 ENCOUNTER — Ambulatory Visit (INDEPENDENT_AMBULATORY_CARE_PROVIDER_SITE_OTHER): Payer: Medicare HMO | Admitting: Internal Medicine

## 2017-06-12 ENCOUNTER — Other Ambulatory Visit: Payer: Self-pay | Admitting: Internal Medicine

## 2017-06-12 ENCOUNTER — Encounter: Payer: Self-pay | Admitting: Internal Medicine

## 2017-06-12 ENCOUNTER — Ambulatory Visit
Admission: RE | Admit: 2017-06-12 | Discharge: 2017-06-12 | Disposition: A | Discharge status: Home / Self Care | Payer: Medicare HMO | Source: Ambulatory Visit | Attending: Family Medicine | Admitting: Family Medicine

## 2017-06-12 VITALS — BP 152/86 | HR 51 | Temp 98.2°F | Wt 254.0 lb

## 2017-06-12 DIAGNOSIS — M25551 Pain in right hip: Secondary | ICD-10-CM

## 2017-06-12 DIAGNOSIS — M160 Bilateral primary osteoarthritis of hip: Secondary | ICD-10-CM | POA: Diagnosis not present

## 2017-06-12 DIAGNOSIS — M25552 Pain in left hip: Principal | ICD-10-CM

## 2017-06-12 MED ORDER — METFORMIN HCL 1000 MG PO TABS: 1000.0000 mg | ORAL_TABLET | Freq: Two times a day (BID) | ORAL | 1 refills | Status: DC

## 2017-06-12 MED ORDER — ACETAMINOPHEN ER 650 MG PO TBCR: 650.0000 mg | EXTENDED_RELEASE_TABLET | Freq: Four times a day (QID) | ORAL | 2 refills | Status: DC | PRN

## 2017-06-12 NOTE — Patient Instructions (Signed)
Ms. Meredith [name],  I will call you with your x-ray results. I am worried about worsening osteoarthritis.  Please try taking tylenol regularly. You can take 650 mg 3-4 times a day.  Best, Dr. Sampson GoonFitzgerald

## 2017-06-13 DIAGNOSIS — M25551 Pain in right hip: Secondary | ICD-10-CM | POA: Insufficient documentation

## 2017-06-13 DIAGNOSIS — M25552 Pain in left hip: Principal | ICD-10-CM

## 2017-06-13 NOTE — Progress Notes (Signed)
[facility] Progress Note  Subjective:  Meredith [name] is a 66 y.o. female with T2DM, DJD, HTN, OA, afib and CAD who presents for complaint of groin pain.  #Groin pain: - Began over 1 month ago - Now having to use a cane to walk - Has pain in both hips but L > R that radiates into groin - Occurring most days. Pain is sharp. Worse with sitting for a long time or standing up.  - No recent falls. [name] out of bed once last year after sitting too close to the edge and turning quickly but says she did okay thereafter. No increased activity. - Did get a new mattress but was about 1 year ago. - Takes tramadol occasionally for knee pain but avoids ibuprofen.  - Denies vaginal discharge and has not had sex since 2009 - Denies back pain or numbness and tingling down legs ROS: No fevers, no change in appetite, no dysuria, does endorse chills but says this is chronic due to her blood thinner  Allergies  Allergen Reactions  . Flexeril [Cyclobenzaprine Hcl]     unknown  . Lipitor [Atorvastatin Calcium]     Myalgias  . Lisinopril Swelling  . Metaxalone Itching and Other (See Comments)    whelps   Social: Former smoker  Objective: Blood pressure (!) 152/86, pulse (!) 51, temperature 98.2 F (36.8 C), temperature source Oral, weight 254 lb (115.2 kg). Body mass index is 44.99 kg/m. Constitutional: Obese female, in NAD Cardiovascular: RRR, S1, S2, no m/r/g.  Pulmonary/Chest: Effort normal and breath sounds normal. No respiratory distress.  Abdominal: +BS, soft, NT, ND Musculoskeletal: Pain with external rotation at L hip and mild discomfort with external rotation of R hip. No TTP over bilateral greater trochanters. Straight leg raise limited by pain on L, normal strength on R. No midline spinal TTP. Neurological: AOx3, no focal deficits. Skin: Skin is warm and dry. No rash or erythema surrounding hips or groin.  Psychiatric: Normal mood and affect.  Vitals reviewed  Had MRI  of L hip in 2012 for left hip pain with radiation down thigh. Findings of "moderate left and mild right hip osteoarthritis with small effusions."  Dg Hips Bilat With Pelvis Min 5 Views  Result Date: 06/12/2017 CLINICAL DATA:  Chronic bilateral hip pain with no known injury. EXAM: DG HIP (WITH OR WITHOUT PELVIS) 5+V BILAT COMPARISON:  Coronal and sagittal CT images through the pelvis and hips from a scan dated January 05, 2015 FINDINGS: The bony pelvis is subjectively adequately mineralized. There is no lytic or blastic lesion. No acute or old fracture is observed. There are degenerative changes at the lumbosacral junction. The SI joints are normal for age. AP and lateral views of both hips reveal mild asymmetric narrowing of the right hip joint space with minimal symmetric narrowing of the left hip joint space. The articular surfaces of the femoral heads and acetabuli remains smoothly rounded. The femoral necks, intertrochanteric, and subtrochanteric regions appear normal. IMPRESSION: There is no acute bony abnormality of either hip. There are mild osteoarthritic changes greatest on the right. Electronically Signed   By: David  [place] M.D.   On: 06/12/2017 15:40   Assessment/Plan: Pain of both hip joints - Acute on chronic. No fever or point tenderness over greater trochanters to suggest septic joint. Concern for worsening OA. - Ordered DG Bilateral Hips with Pelvis (read above) that patient had performed afternoon of appointment, which showed no acute bony abnormality (fracture, lesion). OA changes  read as mild and greater on R than L. - Called patient with results and recommended regular use of acetaminophen 3-4 times a day over the next week and next month if still hurting. Recommended PT referral, but patient declines at this time. - [facility] if she changes her mind about PT referral.   Follow-up if no improvement. Continue to encourage weight loss.   Dani GobbleHillary Fitzgerald, MD [facility], PGY-3

## 2017-06-13 NOTE — Assessment & Plan Note (Addendum)
-   Acute on chronic. No fever or point tenderness over greater trochanters to suggest septic joint. Concern for worsening OA. - Ordered DG Bilateral Hips with Pelvis (read above) that patient had performed afternoon of appointment, which showed no acute bony abnormality (fracture, lesion). OA changes read as mild and greater on R than L. - Called patient with results and recommended regular use of acetaminophen 3-4 times a day over the next week and next month if still hurting. Recommended PT referral, but patient declines at this time. - As[facility] to [facility] if she changes her mind about PT referral.

## 2017-06-16 ENCOUNTER — Other Ambulatory Visit: Payer: Self-pay | Admitting: Nurse Practitioner

## 2017-06-16 NOTE — Telephone Encounter (Signed)
Age [age] years 06/12/2017 Wt 115.2kg 05/16/2017 Saw Dr Mayford Knifeurner 05/16/2017 Hgb 12.7 HCT 37.4 05/16/2017 SrCr 0.77 CrCl 128.93 Refill done for Xarelto 20 mg daily as requested

## 2017-07-01 ENCOUNTER — Other Ambulatory Visit: Payer: Self-pay | Admitting: Internal Medicine

## 2017-07-01 NOTE — Telephone Encounter (Signed)
Pharmacy had question about metformin. Continue 1000 mg BID.

## 2017-07-17 DIAGNOSIS — H1131 Conjunctival hemorrhage, right eye: Secondary | ICD-10-CM | POA: Diagnosis not present

## 2017-08-07 ENCOUNTER — Other Ambulatory Visit: Payer: Self-pay | Admitting: Family Medicine

## 2017-08-07 DIAGNOSIS — Z1231 Encounter for screening mammogram for malignant neoplasm of breast: Secondary | ICD-10-CM

## 2017-08-09 ENCOUNTER — Emergency Department (HOSPITAL_COMMUNITY): Payer: Medicare HMO

## 2017-08-09 ENCOUNTER — Emergency Department (HOSPITAL_COMMUNITY)
Admission: EM | Admit: 2017-08-09 | Discharge: 2017-08-09 | Disposition: A | Discharge status: Home / Self Care | Payer: Medicare HMO | Attending: Emergency Medicine | Admitting: Emergency Medicine

## 2017-08-09 ENCOUNTER — Encounter (HOSPITAL_COMMUNITY): Payer: Self-pay | Admitting: *Deleted

## 2017-08-09 DIAGNOSIS — E119 Type 2 diabetes mellitus without complications: Secondary | ICD-10-CM | POA: Diagnosis not present

## 2017-08-09 DIAGNOSIS — J45909 Unspecified asthma, uncomplicated: Secondary | ICD-10-CM | POA: Diagnosis not present

## 2017-08-09 DIAGNOSIS — Z87891 Personal history of nicotine dependence: Secondary | ICD-10-CM | POA: Insufficient documentation

## 2017-08-09 DIAGNOSIS — Z7902 Long term (current) use of antithrombotics/antiplatelets: Secondary | ICD-10-CM | POA: Diagnosis not present

## 2017-08-09 DIAGNOSIS — Z7984 Long term (current) use of oral hypoglycemic drugs: Secondary | ICD-10-CM | POA: Insufficient documentation

## 2017-08-09 DIAGNOSIS — Z79899 Other long term (current) drug therapy: Secondary | ICD-10-CM | POA: Diagnosis not present

## 2017-08-09 DIAGNOSIS — R102 Pelvic and perineal pain: Secondary | ICD-10-CM

## 2017-08-09 DIAGNOSIS — I2510 Atherosclerotic heart disease of native coronary artery without angina pectoris: Secondary | ICD-10-CM | POA: Insufficient documentation

## 2017-08-09 DIAGNOSIS — I10 Essential (primary) hypertension: Secondary | ICD-10-CM | POA: Insufficient documentation

## 2017-08-09 DIAGNOSIS — K5900 Constipation, unspecified: Secondary | ICD-10-CM | POA: Diagnosis not present

## 2017-08-09 DIAGNOSIS — R1030 Lower abdominal pain, unspecified: Secondary | ICD-10-CM | POA: Diagnosis not present

## 2017-08-09 LAB — COMPREHENSIVE METABOLIC PANEL
ALK PHOS: 51 U/L (ref 38–126)
ALT: 13 U/L — AB (ref 14–54)
AST: 17 U/L (ref 15–41)
Albumin: 3.8 g/dL (ref 3.5–5.0)
Anion gap: 10 (ref 5–15)
BUN: 16 mg/dL (ref 6–20)
CALCIUM: 10.3 mg/dL (ref 8.9–10.3)
CO2: 27 mmol/L (ref 22–32)
CREATININE: 0.96 mg/dL (ref 0.44–1.00)
Chloride: 101 mmol/L (ref 101–111)
GFR, EST NON AFRICAN AMERICAN: 60 mL/min — AB (ref >60)
Glucose, Bld: 130 mg/dL — ABNORMAL HIGH (ref 65–99)
Potassium: 3.7 mmol/L (ref 3.5–5.1)
Sodium: 138 mmol/L (ref 135–145)
Total Bilirubin: 0.6 mg/dL (ref 0.3–1.2)
Total Protein: 7.4 g/dL (ref 6.5–8.1)

## 2017-08-09 LAB — CBC
HEMATOCRIT: 37.7 % (ref 36.0–46.0)
Hemoglobin: 13.1 g/dL (ref 12.0–15.0)
MCH: 25.8 pg — AB (ref 26.0–34.0)
MCHC: 34.7 g/dL (ref 30.0–36.0)
MCV: 74.2 fL — AB (ref 78.0–100.0)
Platelets: 202 10*3/uL (ref 150–400)
RBC: 5.08 MIL/uL (ref 3.87–5.11)
RDW: 14.2 % (ref 11.5–15.5)
WBC: 10 10*3/uL (ref 4.0–10.5)

## 2017-08-09 LAB — URINALYSIS, ROUTINE W REFLEX MICROSCOPIC
BILIRUBIN URINE: NEGATIVE
Glucose, UA: NEGATIVE mg/dL
Hgb urine dipstick: NEGATIVE
Ketones, ur: NEGATIVE mg/dL
Leukocytes, UA: NEGATIVE
Nitrite: NEGATIVE
Protein, ur: NEGATIVE mg/dL
SPECIFIC GRAVITY, URINE: 1.017 (ref 1.005–1.030)
pH: 5 (ref 5.0–8.0)

## 2017-08-09 LAB — LIPASE, BLOOD: Lipase: 41 U/L (ref 11–51)

## 2017-08-09 MED ORDER — POLYETHYLENE GLYCOL 3350 17 GM/SCOOP PO POWD
ORAL | 0 refills | Status: DC
Start: 2017-08-09 — End: 2018-10-22

## 2017-08-09 MED ORDER — ACETAMINOPHEN ER 650 MG PO TBCR: 650.0000 mg | EXTENDED_RELEASE_TABLET | Freq: Three times a day (TID) | ORAL | 0 refills | Status: DC

## 2017-08-09 MED ORDER — DICLOFENAC SODIUM 1 % TD GEL: 2.0000 g | Freq: Four times a day (QID) | TRANSDERMAL | 0 refills | Status: DC

## 2017-08-09 NOTE — ED Triage Notes (Signed)
Pt reports having lower abd pain, rectal pain and bilateral leg pain since wed. Pain increases when walking and moving. Denies urinary symptoms.

## 2017-08-09 NOTE — Discharge Instructions (Signed)
Take Tylenol 3 times a day. You may use Voltaren gel as needed for pain. Take MiraLAX as prescribed. It is important that you stay well-hydrated with water. Follow-up with your primary care doctor in 1 week for further evaluation of her symptoms. Return to the emergency room if you develop fever, chills, loss of bowel or bladder control, numbness, tingling, or any new or worsening symptoms.

## 2017-08-09 NOTE — ED Provider Notes (Signed)
[facility] DEPT Provider Note   CSN: [id number] Arrival date & time: 08/09/17  1459     History   Chief Complaint Chief Complaint  Patient presents with  . Abdominal Pain  . Leg Pain    HPI Meredith [name] is a 66 y.o. female presenting with lower abd/upper ext pain x 4 days.   Patient states that the pain began on Wednesday when she was seated. Described as an ache, which comes and goes. Pain is reproducible with movement. She denies history of similar. She has not taken anything for her pain. Nothing makes it better or worse. She denies recent falls, trauma, or injury. She denies fever, chills, chest pain, shortness of breath, nausea, vomiting. She denies urinary symptoms including hematuria, dysuria, or increased frequency. She denies loss of bowel or bladder control. She states that she last had a bowel movement approximately 7 days ago, and it was very hard. This is abnormal for her. She denies blood in stool. She denies numbness or tingling.  She denies leg pain or swelling. States she is taking Xarelto and takes it daily. Patient states that she had a hip x-ray a few months ago which showed arthritis of bilateral hips.  HPI  Past Medical History:  Diagnosis Date  . ANEMIA, IRON DEFICIENCY, UNSPEC. 02/05/2007   Qualifier: Diagnosis of  By: Damita Dunnings MD, Phillip Heal    . CAD (coronary artery disease), native coronary artery 02/16/2015   Cath with 20% LCx and RCA  . Colon polyps   . Diabetes mellitus   . Diverticulosis 05/17/12  . DJD (degenerative joint disease) of lumbar spine   . Enteritis   . Gastric ulcer 04/2000  . Hyperlipidemia   . Hypertension   . Irregular heart beat   . Obesity   . Osteoarthritis   . Persistent atrial fibrillation (HCC)    CHADS2VASC score is 5 and on Xarelto  . Renal cyst, left 05/17/12  . Ventral hernia     Patient Active Problem List   Diagnosis Date Noted  . Pain of both hip joints 06/13/2017  . Knee pain, chronic 07/21/2016  . CTS (carpal  tunnel syndrome) 06/06/2016  . Numbness of hand 04/06/2016  . Estrogen deficiency 03/29/2016  . CAD (coronary artery disease), native coronary artery 10/26/2015  . Back pain 08/17/2015  . Persistent atrial fibrillation ([place]) 02/23/2015  . PAC (premature atrial contraction) 01/19/2015  . Atrial tachycardia, paroxysmal ([place]) 01/19/2015  . Irregular heart rhythm 11/25/2014  . Healthcare maintenance 03/04/2014  . ASTHMA, INTERMITTENT 09/07/2010  . HERNIA, VENTRAL 04/18/2010  . 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 Diabetes mellitus type II, controlled, with no complications ([place]) 27/61/4709  . HYPERCHOLESTEROLEMIA 02/05/2007  . Obesity 02/05/2007  . DEPRESSION, MAJOR, RECURRENT 02/05/2007  . HYPERTENSION, BENIGN SYSTEMIC 02/05/2007  . GASTROESOPHAGEAL REFLUX, NO ESOPHAGITIS 02/05/2007  . OSTEOARTHRITIS, MULTI SITES 02/05/2007    Past Surgical History:  Procedure Laterality Date  . ABDOMINAL HYSTERECTOMY    . LEFT HEART CATHETERIZATION WITH CORONARY ANGIOGRAM N/A 02/16/2015   Procedure: LEFT HEART CATHETERIZATION WITH CORONARY ANGIOGRAM;  Surgeon: Burnell Blanks, MD;  Location: [facility] CATH LAB;  Service: Cardiovascular;  Laterality: N/A;  . OPERATIVE HYSTEROSCOPY    . TUBAL LIGATION    . UTERINE FIBROID SURGERY      OB History    No data available       Home Medications    Prior to Admission medications   Medication Sig Start Date End Date Taking? Authorizing Provider  amLODipine (NORVASC) 10 MG tablet Take 1 tablet (10 mg total) by mouth daily. 11/15/16  Yes Turner, Eber Hong, MD  ezetimibe (ZETIA) 10 MG tablet TAKE 1 TABLET BY MOUTH EVERY DAY Patient taking differently: Take 10 mg  by mouth once a day 06/10/17  Yes Gambino, Arlie Solomons, MD  hydrochlorothiazide (HYDRODIURIL) 25 MG tablet Take 1 tablet (25 mg total) by mouth daily. 05/16/17 08/14/17 Yes Turner, Traci R, MD  KLOR-CON M20 20 MEQ tablet TAKE 1 TABLET (20 MEQ TOTAL) BY MOUTH DAILY. 05/06/17  Yes Sueanne Margarita, MD  metFORMIN (GLUCOPHAGE) 1000 MG tablet Take 1 tablet (1,000 mg total) by mouth 2 (two) times daily with a meal. 06/12/17  Yes Rogue Bussing, MD  metoprolol succinate (TOPROL-XL) 50 MG 24 hr tablet Take with or immediately following a meal. Patient taking differently: Take 50 mg by mouth daily.  05/16/17  Yes Carlyle Dolly, MD  rosuvastatin (CRESTOR) 20 MG tablet TAKE 1 TABLET BY MOUTH EVERY DAY Patient taking differently: Take 20 mg by mouth once a day 01/13/17  Yes Turner, Eber Hong, MD  traMADol (ULTRAM) 50 MG tablet Take 1 tablet (50 mg total) by mouth every 6 (six) hours as needed. Patient taking differently: Take 50 mg by mouth every 6 (six) hours as needed (for pain).  02/14/17  Yes Gambino, Arlie Solomons, MD  XARELTO 20 MG TABS tablet TAKE 1 TABLET BY MOUTH EVERY DAY WITH SUPPER Patient taking differently: Take 20 mg by mouth every day with Supper 06/16/17  Yes Turner, Eber Hong, MD  acetaminophen (ACETAMINOPHEN 8 HOUR) 650 MG CR tablet Take 1 tablet (650 mg total) by mouth every 8 (eight) hours. 08/09/17   Josefina Rynders, PA-C  Blood Glucose Monitoring Suppl ([employer] BLOOD GLUCOSE METER) w/Device KIT As Directed 10/23/16   Carlyle Dolly, MD  diclofenac sodium (VOLTAREN) 1 % GEL Apply 2 g topically 4 (four) times daily. 08/09/17   Lenis Nettleton, PA-C  glucose blood ([employer] BLOOD GLUCOSE TEST STRIPS) test strip Use test blood glucose once daily. ICD-10 code: E11.9 10/23/16   Dickie La, MD  polyethylene glycol powder [facility]) powder Take 2 capsules daily until constipation is resolved 08/09/17   Jahayra Mazo, PA-C    Family History Family History  Problem Relation Age of Onset  . Diabetes Mother   . Kidney disease Mother   . Lung cancer Father   . Stomach cancer Sister   . Clotting disorder Other        neice  . Colon cancer Neg Hx     Social History Social History  Substance Use Topics  . Smoking status: Former Smoker    Types: Cigarettes     Quit date: 12/09/1978  . Smokeless tobacco: Never Used  . Alcohol use No     Allergies   Lisinopril; Flexeril [cyclobenzaprine hcl]; Lipitor [atorvastatin calcium]; and Metaxalone   Review of Systems Review of Systems  Constitutional: Negative for chills and fever.  Respiratory: Negative for cough, chest tightness and shortness of breath.   Cardiovascular: Negative for chest pain, palpitations and leg swelling.  Gastrointestinal: Positive for constipation. Negative for blood in stool, nausea and vomiting.  Genitourinary: Positive for pelvic pain. Negative for dysuria, frequency, hematuria, vaginal bleeding and vaginal discharge.  Musculoskeletal: Positive for arthralgias.  Skin: Negative for wound.  Neurological: Negative for dizziness and light-headedness.  Hematological: Bruises/bleeds easily.     Physical Exam Updated Vital Signs BP (!) 159/88   Pulse 63   Temp  98.1 F (36.7 C) (Oral)   Resp 16   SpO2 99%   Physical Exam  Constitutional: She is oriented to person, place, and time. She appears well-developed and well-nourished. No distress.  HENT:  Head: Normocephalic and atraumatic.  Eyes: Pupils are equal, round, and reactive to light. Conjunctivae and EOM are normal.  Neck: Normal range of motion. Neck supple.  Cardiovascular: Normal rate, regular rhythm and intact distal pulses.   Pulmonary/Chest: Effort normal and breath sounds normal. No respiratory distress. She has no wheezes.  Abdominal: Soft. Bowel sounds are normal. She exhibits no distension. There is no tenderness. There is no rigidity, no rebound, no guarding, no CVA tenderness, no tenderness at McBurney's point and negative Murphy's sign.  Genitourinary: Rectum normal. Rectal exam shows no external hemorrhoid, no internal hemorrhoid, no fissure, no mass, no tenderness and anal tone normal.  Genitourinary Comments: Rectal exam performed with chaperone in the room. Normal sphincter tone. No gross blood. No  masses or hemorrhoids palpated.  Musculoskeletal: Normal range of motion.  Patient is ambulatory with cane, per baseline. No tenderness to palpation of back, hips, or legs. Strength intact bilaterally. Pedal pulses equal bilaterally. Color and warmth equal bilaterally. Sensation intact bilaterally. No saddle paresthesias or numbness. No pain or swelling of the calves.  Neurological: She is alert and oriented to person, place, and time. She has normal strength and normal reflexes. No sensory deficit. She exhibits normal muscle tone. She displays a negative Romberg sign. Coordination and gait normal. GCS eye subscore is 4. GCS verbal subscore is 5. GCS motor subscore is 6.  Skin: Skin is warm and dry.  Psychiatric: She has a normal mood and affect.  Nursing note and vitals reviewed.    ED Treatments / Results  Labs (all labs ordered are listed, but only abnormal results are displayed) Labs Reviewed  COMPREHENSIVE METABOLIC PANEL - Abnormal; Notable for the following:       Result Value   Glucose, Bld 130 (*)    ALT 13 (*)    GFR calc non Af Amer 60 (*)    All other components within normal limits  CBC - Abnormal; Notable for the following:    MCV 74.2 (*)    MCH 25.8 (*)    All other components within normal limits  URINALYSIS, ROUTINE W REFLEX MICROSCOPIC - Abnormal; Notable for the following:    Bacteria, UA RARE (*)    Squamous Epithelial / LPF 0-5 (*)    All other components within normal limits  LIPASE, BLOOD    EKG  EKG Interpretation None       Radiology Dg Abd 2 Views  Result Date: 08/09/2017 CLINICAL DATA:  Lower abdominal pain EXAM: ABDOMEN - 2 VIEW COMPARISON:  CT 01/05/2015, 08/11/2015 lumbar radiograph FINDINGS: Lung bases are clear. No free air beneath the diaphragm. Multiple calcifications in the pelvis likely phleboliths. Sclerosis at the pubic symphysis. Nonobstructed bowel-gas pattern with moderate stool. IMPRESSION: Nonobstructed gas pattern with moderate  stool Electronically Signed   By: Donavan Foil M.D.   On: 08/09/2017 18:37    Procedures Procedures (including critical care time)  Medications Ordered in ED Medications - No data to display   Initial Impression / Assessment and Plan / ED Course  I have reviewed the triage vital signs and the nursing notes.  Pertinent labs & imaging results that were available during my care of the patient were reviewed by me and considered in my medical decision making (see chart for details).  Vision presenting for 4 days of pelvic and upper leg pain. Physical exam reassuring, as patient without reproducible pain, no numbness or tingling, and normal sphincter tone. When discussing what causes the patient's pain, patient demonstrates by getting off the bed standing up and walking without prompting. Doubt cauda equina. Patient is on blood thinners and without leg pain or swelling. Doubt blood clot. Labs reassuring. UA negative for UTI. X-ray shows moderate stool burden. Discussed case with attending, Dr. Jeanell Sparrow agrees to plan. Likely musculoskeletal pain and/or constipation. Will discharge with scheduled Tylenol and Voltaren gel for pain control. MiraLAX for constipation. Patient follow-up with her primary care doctor, and she states she has an appointment for next Thursday. At this time, patient appears safe for discharge. Strict return precautions given, including loss of bowel or bladder control and numbness. Patient states she understands and agrees to plan.   Final Clinical Impressions(s) / ED Diagnoses   Final diagnoses:  Constipation  Pelvic pain    New Prescriptions Discharge Medication List as of 08/09/2017  7:29 PM    START taking these medications   Details  diclofenac sodium (VOLTAREN) 1 % GEL Apply 2 g topically 4 (four) times daily., Starting Sat 08/09/2017, Print    polyethylene glycol powder (GLYCOLAX/MIRALAX) powder Take 2 capsules daily until constipation is resolved, Print          Franchot Heidelberg, PA-C 08/09/17 1941    Pattricia Boss, MD 08/09/17 2216

## 2017-08-14 ENCOUNTER — Other Ambulatory Visit: Payer: Self-pay | Admitting: *Deleted

## 2017-08-14 ENCOUNTER — Encounter: Payer: Self-pay | Admitting: *Deleted

## 2017-08-14 ENCOUNTER — Ambulatory Visit (INDEPENDENT_AMBULATORY_CARE_PROVIDER_SITE_OTHER): Payer: Medicare HMO | Admitting: *Deleted

## 2017-08-14 VITALS — BP 132/66 | HR 80 | Temp 98.5°F | Ht 63.0 in | Wt 252.6 lb

## 2017-08-14 DIAGNOSIS — Z0000 Encounter for general adult medical examination without abnormal findings: Secondary | ICD-10-CM | POA: Diagnosis not present

## 2017-08-14 DIAGNOSIS — E119 Type 2 diabetes mellitus without complications: Secondary | ICD-10-CM

## 2017-08-14 DIAGNOSIS — E2839 Other primary ovarian failure: Secondary | ICD-10-CM | POA: Diagnosis not present

## 2017-08-14 LAB — POCT GLYCOSYLATED HEMOGLOBIN (HGB A1C): HEMOGLOBIN A1C: 8.5

## 2017-08-14 NOTE — Progress Notes (Signed)
Subjective:   Meredith [name] is a 66 y.o. female who presents for an Initial Medicare Annual Wellness Visit.   Cardiac Risk Factors include: advanced age (>7mn, >>11women);diabetes mellitus;dyslipidemia;hypertension;obesity (BMI >30kg/m2);smoking/ tobacco exposure     Objective:    Today's Vitals   08/14/17 0959 08/14/17 1001  BP:  132/66  Pulse:  80  Temp:  98.5 F (36.9 C)  SpO2:  97%  Weight: 252 lb 9.6 oz (114.6 kg)   Height: _0  (1.6 m)   PainSc: 2  2   PainLoc: Knee    Body mass index is 44.75 kg/m.   Current Medications (verified) Outpatient Encounter Prescriptions as of 08/14/2017  Medication Sig  . acetaminophen (ACETAMINOPHEN 8 HOUR) 650 MG CR tablet Take 1 tablet (650 mg total) by mouth every 8 (eight) hours.  .Marland KitchenamLODipine (NORVASC) 10 MG tablet Take 1 tablet (10 mg total) by mouth daily.  . diclofenac sodium (VOLTAREN) 1 % GEL Apply 2 g topically 4 (four) times daily.  .Marland Kitchenezetimibe (ZETIA) 10 MG tablet TAKE 1 TABLET BY MOUTH EVERY DAY (Patient taking differently: Take 10 mg  by mouth once a day)  . hydrochlorothiazide (HYDRODIURIL) 25 MG tablet Take 1 tablet (25 mg total) by mouth daily.  .Marland KitchenKLOR-CON M20 20 MEQ tablet TAKE 1 TABLET (20 MEQ TOTAL) BY MOUTH DAILY.  . metFORMIN (GLUCOPHAGE) 1000 MG tablet Take 1 tablet (1,000 mg total) by mouth 2 (two) times daily with a meal.  . metoprolol succinate (TOPROL-XL) 50 MG 24 hr tablet Take with or immediately following a meal. (Patient taking differently: Take 50 mg by mouth daily. )  . polyethylene glycol powder (GLYCOLAX/MIRALAX) powder Take 2 capsules daily until constipation is resolved  . rosuvastatin (CRESTOR) 20 MG tablet TAKE 1 TABLET BY MOUTH EVERY DAY (Patient taking differently: Take 20 mg by mouth once a day)  . traMADol (ULTRAM) 50 MG tablet Take 1 tablet (50 mg total) by mouth every 6 (six) hours as needed. (Patient taking differently: Take 50 mg by mouth every 6 (six) hours as needed (for pain). )  .  XARELTO 20 MG TABS tablet TAKE 1 TABLET BY MOUTH EVERY DAY WITH SUPPER (Patient taking differently: Take 20 mg by mouth every day with Supper)  . Blood Glucose Monitoring Suppl ([employer]S BLOOD GLUCOSE METER) w/Device KIT As Directed (Patient not taking: Reported on 08/14/2017)  . glucose blood (CVS BLOOD GLUCOSE TEST STRIPS) test strip Use test blood glucose once daily. ICD-10 code: E11.9 (Patient not taking: Reported on 08/14/2017)   No facility-administered encounter medications on file as of 08/14/2017.     Allergies (verified) Lisinopril; Flexeril [cyclobenzaprine hcl]; Lipitor [atorvastatin calcium]; and Metaxalone   History: Past Medical History:  Diagnosis Date  . ANEMIA, IRON DEFICIENCY, UNSPEC. 02/05/2007   Qualifier: Diagnosis of  By: DDamita DunningsMD, GPhillip Heal   . CAD (coronary artery disease), native coronary artery 02/16/2015   Cath with 20% LCx and RCA  . Colon polyps   . Diabetes mellitus   . Diverticulosis 05/17/12  . DJD (degenerative joint disease) of lumbar spine   . Enteritis   . Gastric ulcer 04/2000  . Hyperlipidemia   . Hypertension   . Irregular heart beat   . Obesity   . Osteoarthritis   . Persistent atrial fibrillation (HCC)    CHADS2VASC score is 5 and on Xarelto  . Renal cyst, left 05/17/12  . Ventral hernia    Past Surgical History:  Procedure Laterality Date  . ABDOMINAL  HYSTERECTOMY    . LEFT HEART CATHETERIZATION WITH CORONARY ANGIOGRAM N/A 02/16/2015   Procedure: LEFT HEART CATHETERIZATION WITH CORONARY ANGIOGRAM;  Surgeon: Burnell Blanks, MD;  Location: [facility] C[facility] LAB;  Service: Cardiovascular;  Laterality: N/A;  . OPERATIVE HYSTEROSCOPY    . TUBAL LIGATION    . UTERINE FIBROID SURGERY     Family History  Problem Relation Age of Onset  . Diabetes Mother   . Kidney disease Mother   . Heart disease Mother   . Lung cancer Father   . Heart disease Father   . Cancer Father   . Stomach cancer Sister   . Cancer Sister   . Clotting disorder Other        neice    . Colon cancer Neg Hx    Social History   Occupational History  . [employer]   Social History Main Topics  . Smoking status: Former Smoker    Packs/day: 1.00    Years: 5.00    Types: Cigarettes    Quit date: 12/09/1978  . Smokeless tobacco: Never Used  . Alcohol use No  . Drug use: No  . Sexual activity: No    Tobacco Counseling Patient is former smoker with no plans to restart  Activities of Daily Living In your present state of health, do you have any difficulty performing the following activities: 08/14/2017  Hearing? N  Vision? N  Difficulty concentrating or making decisions? N  Walking or climbing stairs? Y  Dressing or bathing? N  Doing errands, shopping? Y  Preparing Food and eating ? N  Using the Toilet? N  In the past six months, have you accidently leaked urine? N  Do you have problems with loss of bowel control? N  Managing your Medications? N  Managing your Finances? N  Housekeeping or managing your Housekeeping? N  Some recent data might be hidden   Home Safety:  My home has a working smoke alarm:  Yes X 1           My home throw rugs have been fastened down to the floor or removed:  No, discussed removing or tacking down to decrease fall risk. I have a non-slip surface or non-slip mats in the bathtub and shower:  Yes and grab bar        All my home's stairs have handrails, including any outdoor stairs  One level home with 5 outside steps with handrail          My home's floors, stairs and hallways are free from clutter, wires and cords:  Yes     I have animals in my home  No I wear seatbelts consistently:  No, discussed wearing all the time for safety.   Immunizations and Health Maintenance Immunization History  Administered Date(s) Administered  . Td 02/07/1996, 12/11/2007   Health Maintenance Due  Topic Date Due  . DEXA SCAN  03/04/2015  . PNA vac Low Risk Adult (1 of 2 - PCV13) 03/04/2015  . FOOT EXAM  03/01/2017  . MAMMOGRAM   07/04/2017   Dexa scan ordered today Declined flu vaccine and pneumonia vaccine Patient has appt for mammo tomorrow Hgb A1c done today = 8.5 Last A1c 7.9 on 02/14/2017 Patient had eye exam at [place] last month. Will obtain record Foot exam done today  Diabetic Foot Exam - Simple   Simple Foot Form Diabetic Foot exam was performed with the following findings:  Yes 08/14/2017 10:54 AM  Visual Inspection No deformities,  no ulcerations, no other skin breakdown bilaterally:  Yes Sensation Testing Intact to touch and monofilament testing bilaterally:  Yes Pulse Check Posterior Tibialis and Dorsalis pulse intact bilaterally:  Yes Comments Dry skin noted soles and heels both feet. Foot care discussed. Hubbard Hartshorn, RN, BSN         Patient Care Team: Carlyle Dolly, MD as PCP - General Rutherford Guys, MD as Consulting Physician (Ophthalmology) Sueanne Margarita, MD as Consulting Physician (Cardiology)  Indicate any recent Medical Services you may have received from other than [facility] providers in the past year (date may be approximate).     Assessment:   This is a routine wellness examination for Meredith [name].   Hearing/Vision screen  Hearing Screening   Method: Audiometry   _0  _1  _2  _3  _4  _5  _6  _7  _8   Right ear:   40 40 40  40    Left ear:   40 40 40  Fail      Dietary issues and exercise activities discussed: Current Exercise Habits: Home exercise routine, Type of exercise: walking, Time (Minutes): 20, Frequency (Times/Week): 7, Weekly Exercise (Minutes/Week): 140, Intensity: Mild, Exercise limited by: orthopedic condition(s)  Goals    . Eat more fruits and vegetables    . Maintain current level of activity (pt-stated)          Walking 20 minutes daily      Depression Screen PHQ 2/9 Scores 08/14/2017 06/12/2017 03/13/2017 02/14/2017 11/28/2016 10/10/2016 09/19/2016  PHQ - 2 Score 0 0 0 0 0 0 1    Fall Risk Fall Risk  08/14/2017 02/14/2017 11/28/2016  09/19/2016 04/05/2016  Falls in the past year? No No Yes No No  Number falls in past yr: - - 1 - -  Injury with Fall? - - No - -   TUG Test:  Done in 19 seconds. Patient used one hand to sit back down only. Falls prevention discussed in detail and literature given.  Cognitive Function: Mini-Cog  Passed with score 3/5   Screening Tests Health Maintenance  Topic Date Due  . DEXA SCAN  03/04/2015  . PNA vac Low Risk Adult (1 of 2 - PCV13) 03/04/2015  . FOOT EXAM  03/01/2017  . MAMMOGRAM  07/04/2017  . INFLUENZA VACCINE  03/08/2018 (Originally 07/09/2017)  . HEMOGLOBIN A1C  08/17/2017  . OPHTHALMOLOGY EXAM  09/05/2017  . TETANUS/TDAP  12/10/2017  . COLONOSCOPY  05/22/2020  . Hepatitis C Screening  Completed      Plan:    Dexa scan ordered today Declined flu vaccine and pneumonia vaccine Patient has appt for mammo tomorrow Hgb A1c done today = 8.5 Last A1c 7.9 on 02/14/2017 Patient had eye exam at [place] last month. Will obtain record Foot exam done today  Refill requests sent to PCP in separate message. Patient requesting liquid form of klor-con as she cannot swallow large tabs without throat pain.    I have personally reviewed and noted the following in the patient's chart:   . Medical and social history . Use of alcohol, tobacco or illicit drugs  . Current medications and supplements . Functional ability and status . Nutritional status . Physical activity . Advanced directives . List of other physicians . Hospitalizations, surgeries, and ER visits in previous 12 months . Vitals . Screenings to include cognitive, depression, and falls . Referrals and appointments  In addition, I have reviewed and discussed with patient certain preventive protocols, quality metrics, and best practice recommendations. A written personalized care plan for  preventive services as well as general preventive health recommendations were provided to patient.     Velora Heckler,  RN   08/14/2017

## 2017-08-14 NOTE — Telephone Encounter (Signed)
Patient requesting liquid form of klor-con as she cannot swallow large tabs without throat pain. Meredith [name]. Mikiah Demond, RN, BSN

## 2017-08-14 NOTE — Patient Instructions (Addendum)
Meredith [name],  Thank you for taking time to come for yourMedicare Wellness Visit. I appreciate your ongoing commitment to your health goals. Please review the following plan we discussed and let me know if I can assist you in the future.   These are the goals we discussed:  Goals    . Eat more fruits and vegetables    . Maintain current level of activity (pt-stated)          Walking 20 minutes daily        Bone Densitometry Bone densitometry is an imaging test that uses a special X-ray to measure the amount of calcium and other minerals in your bones (bone density). This test is also known as a bone mineral density test or dual-energy X-ray absorptiometry (DXA). The test can measure bone density at your hip and your spine. It is similar to having a regular X-ray. You may have this test to:  Diagnose a condition that causes weak or thin bones (osteoporosis).  Predict your risk of a broken bone (fracture).  Determine how well osteoporosis treatment is working.  Tell a health care provider about:  Any allergies you have.  All medicines you are taking, including vitamins, herbs, eye drops, creams, and over-the-counter medicines.  Any problems you or family members have had with anesthetic medicines.  Any blood disorders you have.  Any surgeries you have had.  Any medical conditions you have.  Possibility of pregnancy.  Any other medical test you had within the previous 14 days that used contrast material. What are the risks? Generally, this is a safe procedure. However, problems can occur and may include the following:  This test exposes you to a very small amount of radiation.  The risks of radiation exposure may be greater to unborn children.  What happens before the procedure?  Do not take any calcium supplements for 24 hours before having the test. You can otherwise eat and drink what you usually do.  Take off all metal jewelry, eyeglasses, dental appliances,  and any other metal objects. What happens during the procedure?  You may lie on an exam table. There will be an X-ray generator below you and an imaging device above you.  Other devices, such as boxes or braces, may be used to position your body properly for the scan.  You will need to lie still while the machine slowly scans your body.  The images will show up on a computer monitor. What happens after the procedure? You may need more testing at a later time. This information is not intended to replace advice given to you by your health care provider. Make sure you discuss any questions you have with your health care provider. Document Released: 12/17/2004 Document Revised: 05/02/2016 Document Reviewed: 05/05/2014 Elsevier Interactive Patient Education  2018 [employer].  Diabetes and Foot Care Diabetes may cause you to have problems because of poor blood supply (circulation) to your feet and legs. This may cause the skin on your feet to become thinner, break easier, and heal more slowly. Your skin may become dry, and the skin may peel and crack. You may also have nerve damage in your legs and feet causing decreased feeling in them. You may not notice minor injuries to your feet that could lead to infections or more serious problems. Taking care of your feet is one of the most important things you can do for yourself. Follow these instructions at home:  Wear shoes at all times, even in the  house. Do not go barefoot. Bare feet are easily injured.  Check your feet daily for blisters, cuts, and redness. If you cannot see the bottom of your feet, use a mirror or ask someone for help.  Wash your feet with warm water (do not use hot water) and mild soap. Then pat your feet and the areas between your toes until they are completely dry. Do not soak your feet as this can dry your skin.  Apply a moisturizing lotion or petroleum jelly (that does not contain alcohol and is unscented) to the skin on  your feet and to dry, brittle toenails. Do not apply lotion between your toes.  Trim your toenails straight across. Do not dig under them or around the cuticle. File the edges of your nails with an emery board or nail file.  Do not cut corns or calluses or try to remove them with medicine.  Wear clean socks or stockings every day. Make sure they are not too tight. Do not wear knee-high stockings since they may decrease blood flow to your legs.  Wear shoes that fit properly and have enough cushioning. To break in new shoes, wear them for just a few hours a day. This prevents you from injuring your feet. Always look in your shoes before you put them on to be sure there are no objects inside.  Do not cross your legs. This may decrease the blood flow to your feet.  If you find a minor scrape, cut, or break in the skin on your feet, keep it and the skin around it clean and dry. These areas may be cleansed with mild soap and water. Do not cleanse the area with peroxide, alcohol, or iodine.  When you remove an adhesive bandage, be sure not to damage the skin around it.  If you have a wound, look at it several times a day to make sure it is healing.  Do not use heating pads or hot water bottles. They may burn your skin. If you have lost feeling in your feet or legs, you may not know it is happening until it is too late.  Make sure your health care provider performs a complete foot exam at least annually or more often if you have foot problems. Report any cuts, sores, or bruises to your health care provider immediately. Contact a health care provider if:  You have an injury that is not healing.  You have cuts or breaks in the skin.  You have an ingrown nail.  You notice redness on your legs or feet.  You feel burning or tingling in your legs or feet.  You have pain or cramps in your legs and feet.  Your legs or feet are numb.  Your feet always feel cold. Get help right away if:  There  is increasing redness, swelling, or pain in or around a wound.  There is a red line that goes up your leg.  Pus is coming from a wound.  You develop a fever or as directed by your health care provider.  You notice a bad smell coming from an ulcer or wound. This information is not intended to replace advice given to you by your health care provider. Make sure you discuss any questions you have with your health care provider. Document Released: 11/22/2000 Document Revised: 05/02/2016 Document Reviewed: 05/04/2013 Elsevier Interactive Patient Education  2017 [place] Prevention in the Home Falls can cause injuries. They can happen to people of  all ages. There are many things you can do to make your home safe and to help prevent falls. What can I do on the outside of my home?  Regularly fix the edges of walkways and driveways and fix any cracks.  Remove anything that might make you trip as you walk through a door, such as a raised step or threshold.  Trim any bushes or trees on the path to your home.  Use bright outdoor lighting.  Clear any walking paths of anything that might make someone trip, such as rocks or tools.  Regularly check to see if handrails are loose or broken. Make sure that both sides of any steps have handrails.  Any raised decks and porches should have guardrails on the edges.  Have any leaves, snow, or ice cleared regularly.  Use sand or salt on walking paths during winter.  Clean up any spills in your garage right away. This includes oil or grease spills. What can I do in the bathroom?  Use night lights.  Install grab bars by the toilet and in the tub and shower. Do not use towel bars as grab bars.  Use non-skid mats or decals in the tub or shower.  If you need to sit down in the shower, use a plastic, non-slip stool.  Keep the floor dry. Clean up any water that spills on the floor as soon as it happens.  Remove soap buildup in the tub or  shower regularly.  Attach bath mats securely with double-sided non-slip rug tape.  Do not have throw rugs and other things on the floor that can make you trip. What can I do in the bedroom?  Use night lights.  Make sure that you have a light by your bed that is easy to reach.  Do not use any sheets or blankets that are too big for your bed. They should not hang down onto the floor.  Have a firm chair that has side arms. You can use this for support while you get dressed.  Do not have throw rugs and other things on the floor that can make you trip. What can I do in the kitchen?  Clean up any spills right away.  Avoid walking on wet floors.  Keep items that you use a lot in easy-to-reach places.  If you need to reach something above you, use a strong step stool that has a grab bar.  Keep electrical cords out of the way.  Do not use floor polish or wax that makes floors slippery. If you must use wax, use non-skid floor wax.  Do not have throw rugs and other things on the floor that can make you trip. What can I do with my stairs?  Do not leave any items on the stairs.  Make sure that there are handrails on both sides of the stairs and use them. Fix handrails that are broken or loose. Make sure that handrails are as long as the stairways.  Check any carpeting to make sure that it is firmly attached to the stairs. Fix any carpet that is loose or worn.  Avoid having throw rugs at the top or bottom of the stairs. If you do have throw rugs, attach them to the floor with carpet tape.  Make sure that you have a light switch at the top of the stairs and the bottom of the stairs. If you do not have them, ask someone to add them for you. What else can I do  to help prevent falls?  Wear shoes that: ? Do not have high heels. ? Have rubber bottoms. ? Are comfortable and fit you well. ? Are closed at the toe. Do not wear sandals.  If you use a stepladder: ? Make sure that it is fully  opened. Do not climb a closed stepladder. ? Make sure that both sides of the stepladder are locked into place. ? Ask someone to hold it for you, if possible.  Clearly mark and make sure that you can see: ? Any grab bars or handrails. ? First and last steps. ? Where the edge of each step is.  Use tools that help you move around (mobility aids) if they are needed. These include: ? Canes. ? Walkers. ? Scooters. ? Crutches.  Turn on the lights when you go into a dark area. Replace any light bulbs as soon as they burn out.  Set up your furniture so you have a clear path. Avoid moving your furniture around.  If any of your floors are uneven, fix them.  If there are any pets around you, be aware of where they are.  Review your medicines with your doctor. Some medicines can make you feel dizzy. This can increase your chance of falling. Ask your doctor what other things that you can do to help prevent falls. This information is not intended to replace advice given to you by your health care provider. Make sure you discuss any questions you have with your health care provider. Document Released: 09/21/2009 Document Revised: 05/02/2016 Document Reviewed: 12/30/2014 Elsevier Interactive Patient Education  2018 [place] Maintenance, Female Adopting a healthy lifestyle and getting preventive care can go a long way to promote health and wellness. Talk with your health care provider about what schedule of regular examinations is right for you. This is a good chance for you to check in with your provider about disease prevention and staying healthy. In between checkups, there are plenty of things you can do on your own. Experts have done a lot of research about which lifestyle changes and preventive measures are most likely to keep you healthy. Ask your health care provider for more information. Weight and diet Eat a healthy diet  Be sure to include plenty of vegetables, fruits,  low-fat dairy products, and lean protein.  Do not eat a lot of foods high in solid fats, added sugars, or salt.  Get regular exercise. This is one of the most important things you can do for your health. ? Most adults should exercise for at least 150 minutes each week. The exercise should increase your heart rate and make you sweat (moderate-intensity exercise). ? Most adults should also do strengthening exercises at least twice a week. This is in addition to the moderate-intensity exercise.  Maintain a healthy weight  Body mass index (BMI) is a measurement that can be used to identify possible weight problems. It estimates body fat based on height and weight. Your health care provider can help determine your BMI and help you achieve or maintain a healthy weight.  For females 17 years of age and older: ? A BMI below 18.5 is considered underweight. ? A BMI of 18.5 to 24.9 is normal. ? A BMI of 25 to 29.9 is considered overweight. ? A BMI of 30 and above is considered obese.  Watch levels of cholesterol and blood lipids  You should start having your blood tested for lipids and cholesterol at 67 years of age, then have  this test every 5 years.  You may need to have your cholesterol levels checked more often if: ? Your lipid or cholesterol levels are high. ? You are older than 67 years of age. ? You are at high risk for heart disease.  Cancer screening Lung Cancer  Lung cancer screening is recommended for adults 36-56 years old who are at high risk for lung cancer because of a history of smoking.  A yearly low-dose CT scan of the lungs is recommended for people who: ? Currently smoke. ? Have quit within the past 15 years. ? Have at least a 30-pack-year history of smoking. A pack year is smoking an average of one pack of cigarettes a day for 1 year.  Yearly screening should continue until it has been 15 years since you quit.  Yearly screening should stop if you develop a health  problem that would prevent you from having lung cancer treatment.  Breast Cancer  Practice breast self-awareness. This means understanding how your breasts normally appear and feel.  It also means doing regular breast self-exams. Let your health care provider know about any changes, no matter how small.  If you are in your 20s or 30s, you should have a clinical breast exam (CBE) by a health care provider every 1-3 years as part of a regular health exam.  If you are 55 or older, have a CBE every year. Also consider having a breast X-ray (mammogram) every year.  If you have a family history of breast cancer, talk to your health care provider about genetic screening.  If you are at high risk for breast cancer, talk to your health care provider about having an MRI and a mammogram every year.  Breast cancer gene (BRCA) assessment is recommended for women who have family members with BRCA-related cancers. BRCA-related cancers include: ? Breast. ? Ovarian. ? Tubal. ? Peritoneal cancers.  Results of the assessment will determine the need for genetic counseling and BRCA1 and BRCA2 testing.  Cervical Cancer Your health care provider may recommend that you be screened regularly for cancer of the pelvic organs (ovaries, uterus, and vagina). This screening involves a pelvic examination, including checking for microscopic changes to the surface of your cervix (Pap test). You may be encouraged to have this screening done every 3 years, beginning at age 61.  For women ages 63-65, health care providers may recommend pelvic exams and Pap testing every 3 years, or they may recommend the Pap and pelvic exam, combined with testing for human papilloma virus (HPV), every 5 years. Some types of HPV increase your risk of cervical cancer. Testing for HPV may also be done on women of any age with unclear Pap test results.  Other health care providers may not recommend any screening for nonpregnant women who are  considered low risk for pelvic cancer and who do not have symptoms. Ask your health care provider if a screening pelvic exam is right for you.  If you have had past treatment for cervical cancer or a condition that could lead to cancer, you need Pap tests and screening for cancer for at least 20 years after your treatment. If Pap tests have been discontinued, your risk factors (such as having a new sexual partner) need to be reassessed to determine if screening should resume. Some women have medical problems that increase the chance of getting cervical cancer. In these cases, your health care provider may recommend more frequent screening and Pap tests.  Colorectal Cancer  This  type of cancer can be detected and often prevented.  Routine colorectal cancer screening usually begins at 67 years of age and continues through 67 years of age.  Your health care provider may recommend screening at an earlier age if you have risk factors for colon cancer.  Your health care provider may also recommend using home test kits to check for hidden blood in the stool.  A small camera at the end of a tube can be used to examine your colon directly (sigmoidoscopy or colonoscopy). This is done to check for the earliest forms of colorectal cancer.  Routine screening usually begins at age 60.  Direct examination of the colon should be repeated every 5-10 years through 67 years of age. However, you may need to be screened more often if early forms of precancerous polyps or small growths are found.  Skin Cancer  Check your skin from head to toe regularly.  Tell your health care provider about any new moles or changes in moles, especially if there is a change in a mole's shape or color.  Also tell your health care provider if you have a mole that is larger than the size of a pencil eraser.  Always use sunscreen. Apply sunscreen liberally and repeatedly throughout the day.  Protect yourself by wearing long  sleeves, pants, a wide-brimmed hat, and sunglasses whenever you are outside.  Heart disease, diabetes, and high blood pressure  High blood pressure causes heart disease and increases the risk of stroke. High blood pressure is more likely to develop in: ? People who have blood pressure in the high end of the normal range (130-139/85-89 mm Hg). ? People who are overweight or obese. ? People who are African American.  If you are 76-48 years of age, have your blood pressure checked every 3-5 years. If you are 67 years of age or older, have your blood pressure checked every year. You should have your blood pressure measured twice-once when you are at a hospital or clinic, and once when you are not at a hospital or clinic. Record the average of the two measurements. To check your blood pressure when you are not at a hospital or clinic, you can use: ? An automated blood pressure machine at a pharmacy. ? A home blood pressure monitor.  If you are between 71 years and 52 years old, ask your health care provider if you should take aspirin to prevent strokes.  Have regular diabetes screenings. This involves taking a blood sample to check your fasting blood sugar level. ? If you are at a normal weight and have a low risk for diabetes, have this test once every three years after 67 years of age. ? If you are overweight and have a high risk for diabetes, consider being tested at a younger age or more often. Preventing infection Hepatitis B  If you have a higher risk for hepatitis B, you should be screened for this virus. You are considered at high risk for hepatitis B if: ? You were born in a country where hepatitis B is common. Ask your health care provider which countries are considered high risk. ? Your parents were born in a high-risk country, and you have not been immunized against hepatitis B (hepatitis B vaccine). ? You have HIV or AIDS. ? You use needles to inject street drugs. ? You live with  someone who has hepatitis B. ? You have had sex with someone who has hepatitis B. ? You get hemodialysis treatment. ?  You take certain medicines for conditions, including cancer, organ transplantation, and autoimmune conditions.  Hepatitis C  Blood testing is recommended for: ? Everyone born from [birth year] through 1966. ? Anyone with known risk factors for hepatitis C.  Sexually transmitted infections (STIs)  You should be screened for sexually transmitted infections (STIs) including gonorrhea and chlamydia if: ? You are sexually active and are younger than 67 years of age. ? You are older than 67 years of age and your health care provider tells you that you are at risk for this type of infection. ? Your sexual activity has changed since you were last screened and you are at an increased risk for chlamydia or gonorrhea. Ask your health care provider if you are at risk.  If you do not have HIV, but are at risk, it may be recommended that you take a prescription medicine daily to prevent HIV infection. This is called pre-exposure prophylaxis (PrEP). You are considered at risk if: ? You are sexually active and do not regularly use condoms or know the HIV status of your partner(s). ? You take drugs by injection. ? You are sexually active with a partner who has HIV.  Talk with your health care provider about whether you are at high risk of being infected with HIV. If you choose to begin PrEP, you should first be tested for HIV. You should then be tested every 3 months for as long as you are taking PrEP. Pregnancy  If you are premenopausal and you may become pregnant, ask your health care provider about preconception counseling.  If you may become pregnant, take 400 to 800 micrograms (mcg) of folic acid every day.  If you want to prevent pregnancy, talk to your health care provider about birth control (contraception). Osteoporosis and menopause  Osteoporosis is a disease in which the bones lose  minerals and strength with aging. This can result in serious bone fractures. Your risk for osteoporosis can be identified using a bone density scan.  If you are 58 years of age or older, or if you are at risk for osteoporosis and fractures, ask your health care provider if you should be screened.  Ask your health care provider whether you should take a calcium or vitamin D supplement to lower your risk for osteoporosis.  Menopause may have certain physical symptoms and risks.  Hormone replacement therapy may reduce some of these symptoms and risks. Talk to your health care provider about whether hormone replacement therapy is right for you. Follow these instructions at home:  Schedule regular health, dental, and eye exams.  Stay current with your immunizations.  Do not use any tobacco products including cigarettes, chewing tobacco, or electronic cigarettes.  If you are pregnant, do not drink alcohol.  If you are breastfeeding, limit how much and how often you drink alcohol.  Limit alcohol intake to no more than 1 drink per day for nonpregnant women. One drink equals 12 ounces of beer, 5 ounces of wine, or 1 ounces of hard liquor.  Do not use street drugs.  Do not share needles.  Ask your health care provider for help if you need support or information about quitting drugs.  Tell your health care provider if you often feel depressed.  Tell your health care provider if you have ever been abused or do not feel safe at home. This information is not intended to replace advice given to you by your health care provider. Make sure you discuss any questions you have  with your health care provider. Document Released: 06/10/2011 Document Revised: 05/02/2016 Document Reviewed: 08/29/2015 Elsevier Interactive Patient Education  [employer].

## 2017-08-15 ENCOUNTER — Ambulatory Visit
Admission: RE | Admit: 2017-08-15 | Discharge: 2017-08-15 | Disposition: A | Discharge status: Home / Self Care | Payer: Medicare HMO | Source: Ambulatory Visit | Attending: Family Medicine | Admitting: Family Medicine

## 2017-08-15 DIAGNOSIS — Z1231 Encounter for screening mammogram for malignant neoplasm of breast: Secondary | ICD-10-CM

## 2017-08-18 MED ORDER — GLUCOSE BLOOD VI STRP: ORAL_STRIP | 12 refills | Status: DC

## 2017-08-18 MED ORDER — TRAMADOL HCL 50 MG PO TABS: 50.0000 mg | ORAL_TABLET | Freq: Four times a day (QID) | ORAL | 2 refills | Status: DC | PRN

## 2017-08-18 NOTE — Progress Notes (Signed)
addendum: I have reviewed this visit and discussed with Alecia LemmingLauren Ducatte, RN, BSN, and agree with her documentation.  Anders Simmondshristina Gambino, MD [facility], PGY-3

## 2017-09-04 ENCOUNTER — Ambulatory Visit
Admission: RE | Admit: 2017-09-04 | Discharge: 2017-09-04 | Disposition: A | Discharge status: Home / Self Care | Payer: Medicare HMO | Source: Ambulatory Visit | Attending: Family Medicine | Admitting: Family Medicine

## 2017-09-04 DIAGNOSIS — M8589 Other specified disorders of bone density and structure, multiple sites: Secondary | ICD-10-CM | POA: Diagnosis not present

## 2017-09-04 DIAGNOSIS — E2839 Other primary ovarian failure: Secondary | ICD-10-CM

## 2017-09-04 DIAGNOSIS — Z780 Asymptomatic menopausal state: Secondary | ICD-10-CM | POA: Diagnosis not present

## 2017-09-15 ENCOUNTER — Telehealth: Payer: Self-pay | Admitting: Family Medicine

## 2017-09-15 DIAGNOSIS — M8580 Other specified disorders of bone density and structure, unspecified site: Secondary | ICD-10-CM

## 2017-09-15 NOTE — Telephone Encounter (Signed)
Called patient to discuss DEXA scan. Discussed that she has osteopenia, no osteoporosis. Advised she continue eating dairy for calcium and a multivitamin with vitamin D. No treatment needed according to FRAX score. Will recheck DEXA in 2 years.  Christina Gambino, MD [facility], PGY-3

## 2017-09-16 ENCOUNTER — Other Ambulatory Visit: Payer: Self-pay | Admitting: *Deleted

## 2017-09-16 MED ORDER — GLUCOSE BLOOD VI STRP: ORAL_STRIP | 12 refills | Status: DC

## 2017-09-17 ENCOUNTER — Telehealth: Payer: Self-pay | Admitting: *Deleted

## 2017-09-17 MED ORDER — ACCU-CHEK SOFTCLIX LANCET DEV KIT: PACK | 0 refills | Status: DC

## 2017-09-17 MED ORDER — ACCU-CHEK SOFTCLIX LANCETS MISC: 12 refills | Status: DC

## 2017-09-17 MED ORDER — GLUCOSE BLOOD VI STRP: ORAL_STRIP | 12 refills | Status: DC

## 2017-09-17 MED ORDER — ACCU-CHEK AVIVA PLUS W/DEVICE KIT: 1.0000 | PACK | 0 refills | Status: DC

## 2017-09-17 NOTE — Telephone Encounter (Signed)
Received fax from [facility] stating patient's insurance will not cover Turetrack test strips and to send new rx for meter, lancets and strips.  Clovis Pu, RN

## 2017-10-05 ENCOUNTER — Other Ambulatory Visit: Payer: Self-pay | Admitting: Family Medicine

## 2017-10-19 ENCOUNTER — Other Ambulatory Visit: Payer: Self-pay | Admitting: Family Medicine

## 2017-11-04 IMAGING — CR DG HIP (WITH OR WITHOUT PELVIS) 5+V BILAT
5 series · 5 of 5 positions shown · non-contrast
Comparison: Coronal and sagittal CT images through the pelvis and
hips from a scan dated January 05, 2015

CLINICAL DATA: Chronic bilateral hip pain with no known injury.

EXAM:
DG HIP (WITH OR WITHOUT PELVIS) 5+V BILAT

[w hip ap left]
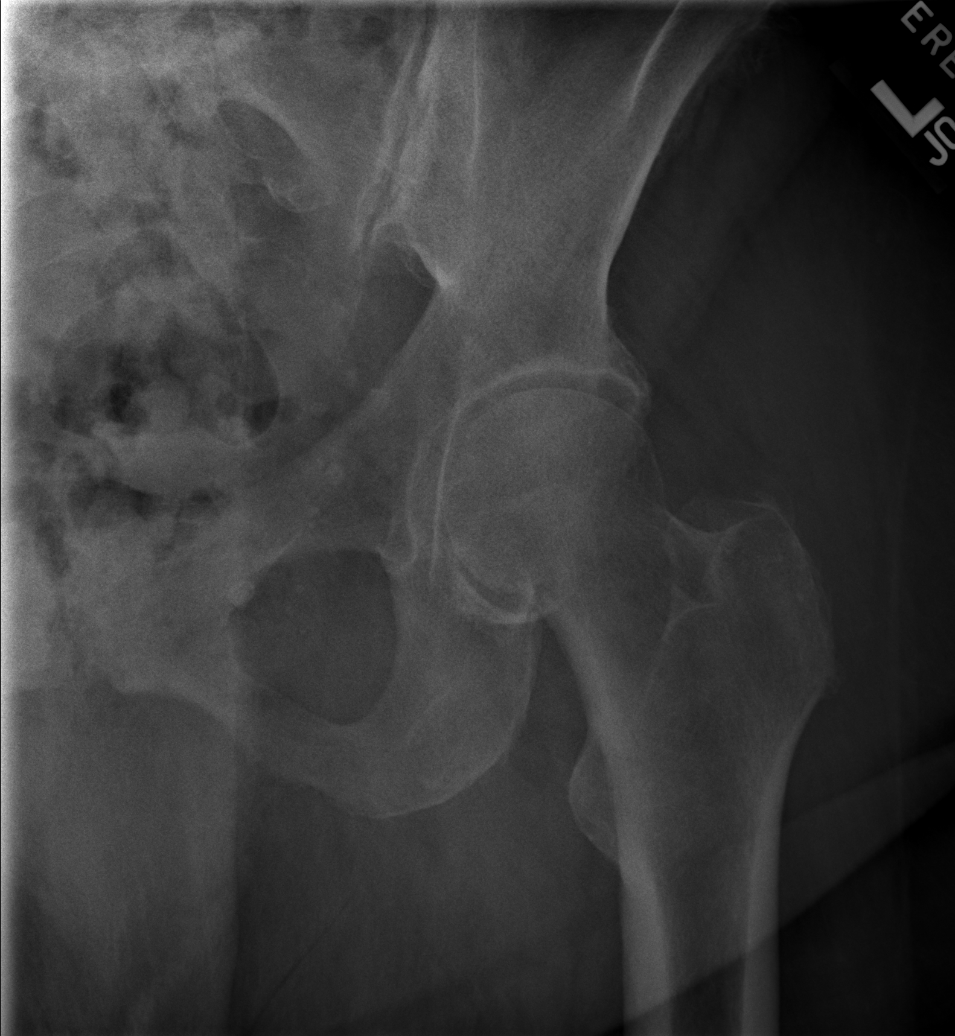

[w hip frog left]
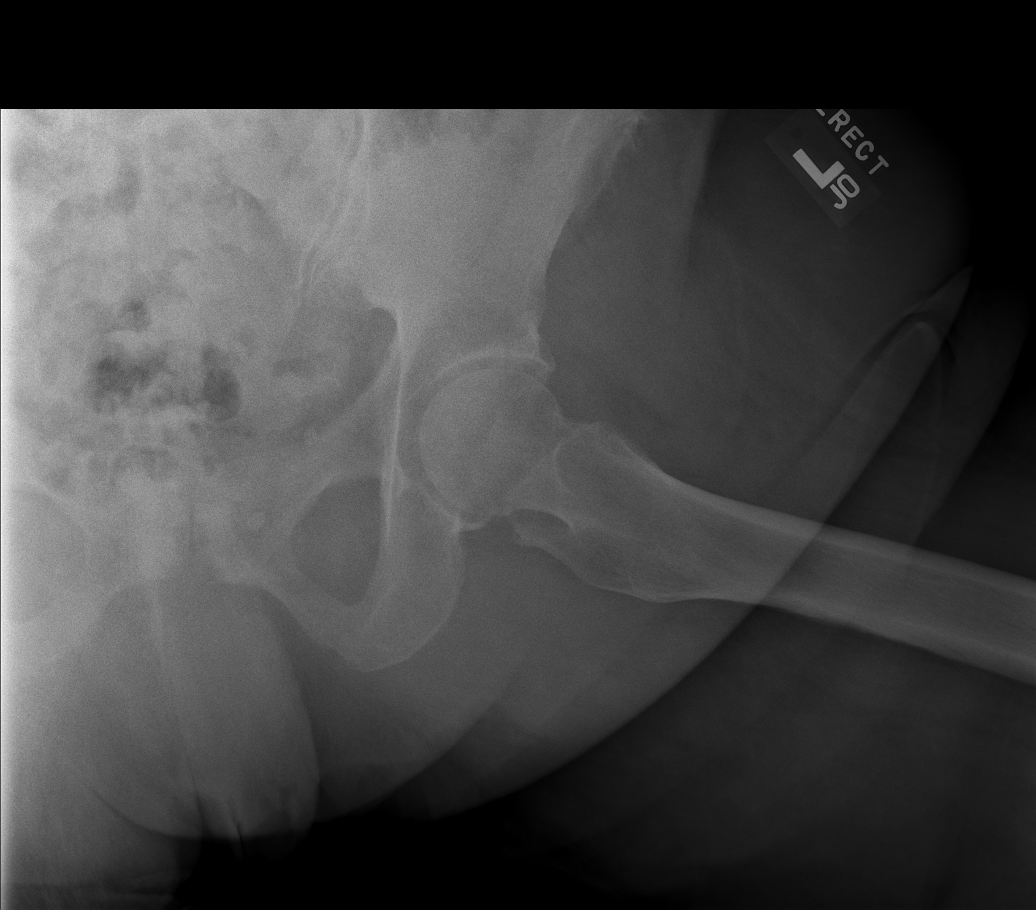

[w hip ap right]
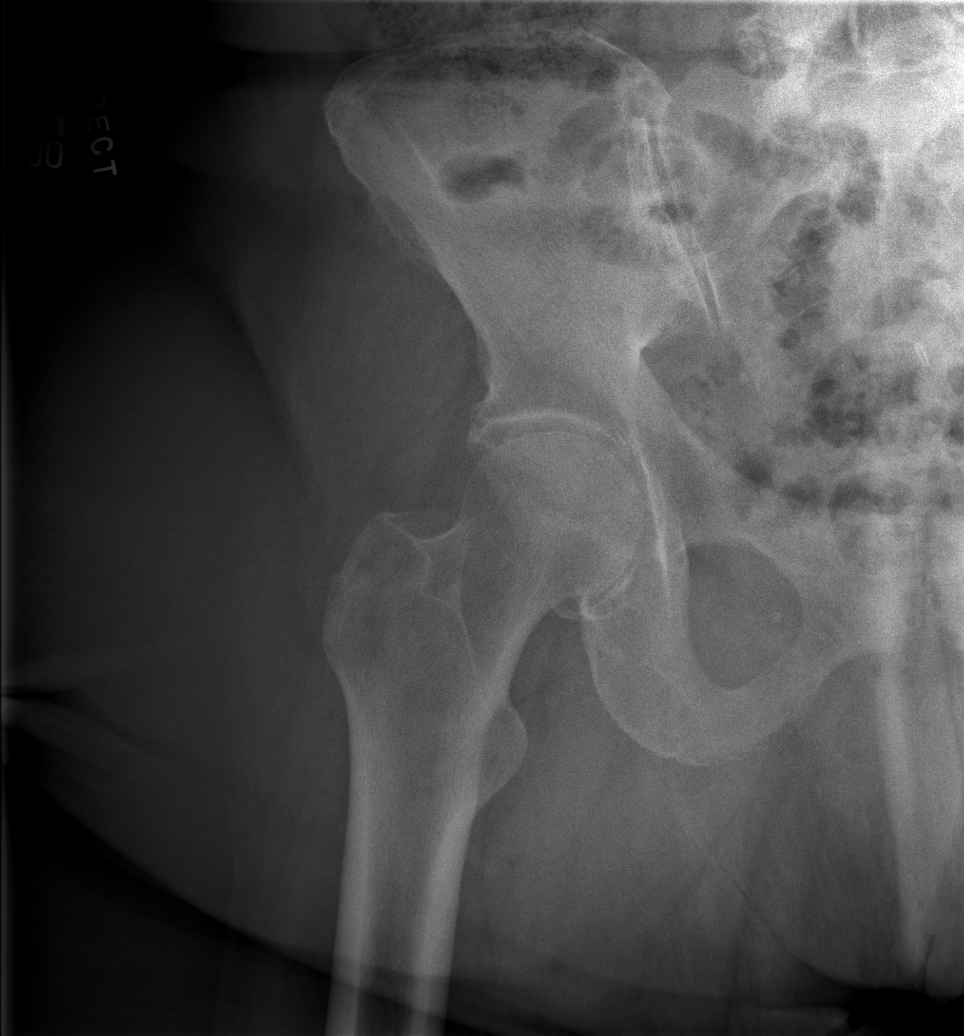

[w hip frog right]
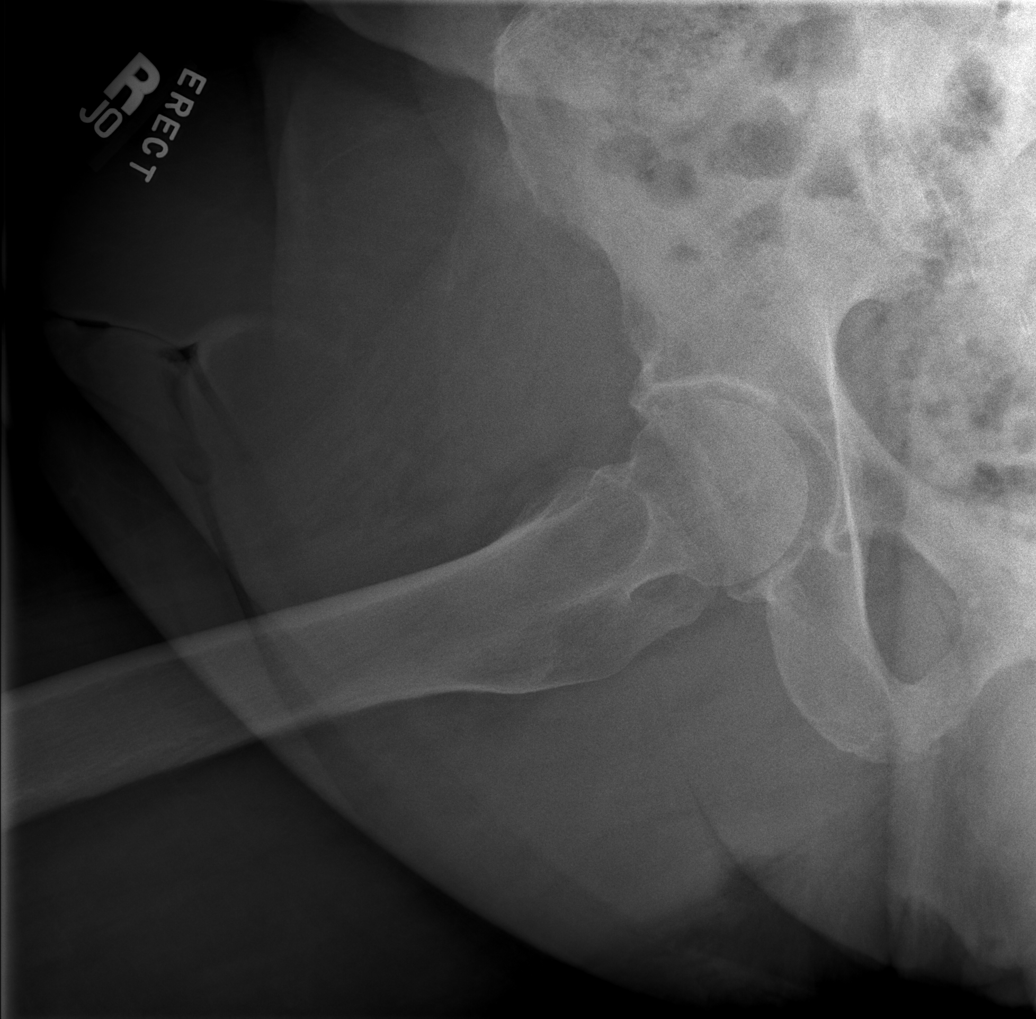

[w pelvis upright]
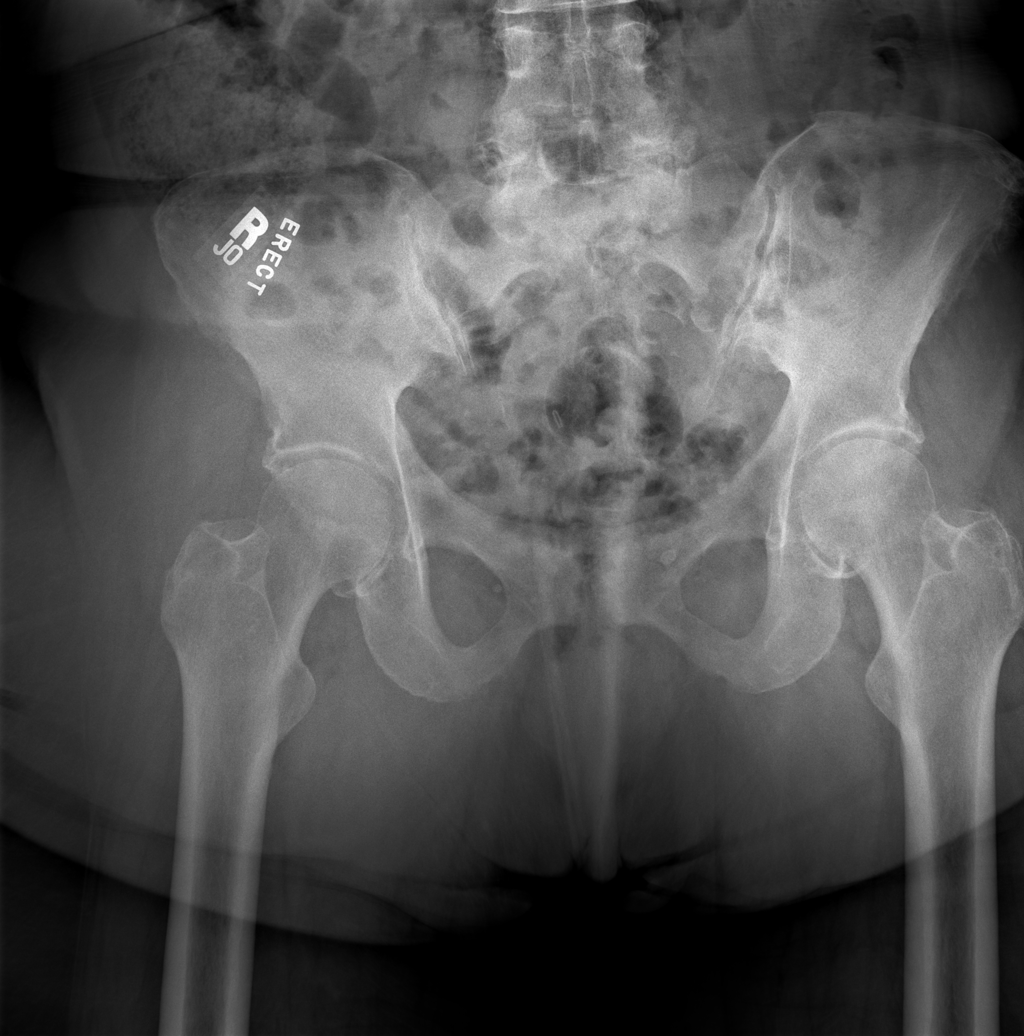

[5 of 5 positions shown; findings below may reference images not displayed]

FINDINGS: The bony pelvis is subjectively adequately mineralized. There is no
lytic or blastic lesion. No acute or old fracture is observed. There
are degenerative changes at the lumbosacral junction. The SI joints
are normal for age.

AP and lateral views of both hips reveal mild asymmetric narrowing
of the right hip joint space with minimal symmetric narrowing of the
left hip joint space. The articular surfaces of the femoral heads
and acetabuli remains smoothly rounded. The femoral necks,
intertrochanteric, and subtrochanteric regions appear normal.
IMPRESSION: There is no acute bony abnormality of either hip. There are mild
osteoarthritic changes greatest on the right.

## 2017-11-13 ENCOUNTER — Encounter: Payer: Self-pay | Admitting: Family Medicine

## 2017-11-13 ENCOUNTER — Ambulatory Visit: Payer: Medicare HMO | Admitting: Family Medicine

## 2017-11-13 ENCOUNTER — Other Ambulatory Visit: Payer: Self-pay

## 2017-11-13 VITALS — BP 139/82 | HR 73 | Temp 97.6°F | Ht 63.0 in | Wt 252.6 lb

## 2017-11-13 DIAGNOSIS — E119 Type 2 diabetes mellitus without complications: Secondary | ICD-10-CM | POA: Diagnosis not present

## 2017-11-13 DIAGNOSIS — R102 Pelvic and perineal pain: Secondary | ICD-10-CM | POA: Diagnosis not present

## 2017-11-13 LAB — POCT GLYCOSYLATED HEMOGLOBIN (HGB A1C): HEMOGLOBIN A1C: 8.5

## 2017-11-13 MED ORDER — FLUCONAZOLE 150 MG PO TABS: 150.0000 mg | ORAL_TABLET | Freq: Once | ORAL | 0 refills | Status: AC

## 2017-11-13 NOTE — Patient Instructions (Signed)
Thank you for coming in today, it was so nice to see you! Today we talked about:    Vaginal pain: This may be due to a yeast infection. We are going to try a medication called Diflucan. It is 1 pill. Try this and if you are still having symptoms after a week, please come back and see us.    If you have any questions or concerns, please do not hesitate to call the office at [phone]. You can also message me directly via MyChart.   Sincerely,  Anders Simmondshristina Ayleen Mckinstry, MD   Vaginal Yeast infection, Adult Vaginal yeast infection is a condition that causes soreness, swelling, and redness (inflammation) of the vagina. It also causes vaginal discharge. This is a common condition. Some women get this infection frequently. What are the causes? This condition is caused by a change in the normal balance of the yeast (candida) and bacteria that live in the vagina. This change causes an overgrowth of yeast, which causes the inflammation. What increases the risk? This condition is more likely to develop in:  Women who take antibiotic medicines.  Women who have diabetes.  Women who take birth control pills.  Women who are pregnant.  Women who douche often.  Women who have a weak defense (immune) system.  Women who have been taking steroid medicines for a long time.  Women who frequently wear tight clothing.  What are the signs or symptoms? Symptoms of this condition include:  White, thick vaginal discharge.  Swelling, itching, redness, and irritation of the vagina. The lips of the vagina (vulva) may be affected as well.  Pain or a burning feeling while urinating.  Pain during sex.  How is this diagnosed? This condition is diagnosed with a medical history and physical exam. This will include a pelvic exam. Your health care provider will examine a sample of your vaginal discharge under a microscope. Your health care provider may send this sample for testing to confirm the  diagnosis. How is this treated? This condition is treated with medicine. Medicines may be over-the-counter or prescription. You may be told to use one or more of the following:  Medicine that is taken orally.  Medicine that is applied as a cream.  Medicine that is inserted directly into the vagina (suppository).  Follow these instructions at home:  Take or apply over-the-counter and prescription medicines only as told by your health care provider.  Do not have sex until your health care provider has approved. Tell your sex partner that you have a yeast infection. That person should go to his or her health care provider if he or she develops symptoms.  Do not wear tight clothes, such as pantyhose or tight pants.  Avoid using tampons until your health care provider approves.  Eat more yogurt. This may help to keep your yeast infection from returning.  Try taking a sitz bath to help with discomfort. This is a warm water bath that is taken while you are sitting down. The water should only come up to your hips and should cover your buttocks. Do this 3-4 times per day or as told by your health care provider.  Do not douche.  Wear breathable, cotton underwear.  If you have diabetes, keep your blood sugar levels under control. Contact a health care provider if:  You have a fever.  Your symptoms go away and then return.  Your symptoms do not get better with treatment.  Your symptoms get worse.  You have new  symptoms.  You develop blisters in or around your vagina.  You have blood coming from your vagina and it is not your menstrual period.  You develop pain in your abdomen. This information is not intended to replace advice given to you by your health care provider. Make sure you discuss any questions you have with your health care provider. Document Released: 09/04/2005 Document Revised: 05/08/2016 Document Reviewed: 05/29/2015 Elsevier Interactive Patient Education  2018  [employer]

## 2017-11-13 NOTE — Progress Notes (Signed)
Subjective:    Patient ID: Meredith [name] , female   DOB: Aug 19, 1951 , 66 y.o..   MRN: [id number]  HPI  Meredith [name] is here for   1. Pain in vagina/pelvic area: Patient states that she has had pain on the inside of her vagina for about 3 months now.  She notes that this pain will come on about 3 times a week when she is ambulating.  She notes that occasionally she will feel the pain at rest.  She describes the pain as "throbbing".  She notes that she has tried Tylenol but it has not been helping her at all.  Denies any vaginal bleeding or discharge.  Is not sexually active.  Notes that the last time she was sexually active was in 2009.  No new soaps or detergents.  No abdominal pain, nausea, vomiting, diarrhea, constipation, dysuria, polyuria.  She notes that she has had a hysterectomy in the past.  Review of Systems: Per HPI.   Past Medical History: Patient Active Problem List   Diagnosis Date Noted  . Pelvic pain 11/14/2017  . Osteopenia 09/15/2017  . Pain of both hip joints 06/13/2017  . Knee pain, chronic 07/21/2016  . CTS (carpal tunnel syndrome) 06/06/2016  . Numbness of hand 04/06/2016  . Estrogen deficiency 03/29/2016  . CAD (coronary artery disease), native coronary artery 10/26/2015  . Back pain 08/17/2015  . Persistent atrial fibrillation (HCC) 02/23/2015  . PAC (premature atrial contraction) 01/19/2015  . Atrial tachycardia, paroxysmal (HCC) 01/19/2015  . Irregular heart rhythm 11/25/2014  . Healthcare maintenance 03/04/2014  . ASTHMA, INTERMITTENT 09/07/2010  . HERNIA, VENTRAL 04/18/2010  . 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 Diabetes mellitus type II, controlled, with no complications (HCC) 02/05/2007  . HYPERCHOLESTEROLEMIA 02/05/2007  . Obesity 02/05/2007  . DEPRESSION, MAJOR, RECURRENT 02/05/2007  . HYPERTENSION, BENIGN SYSTEMIC 02/05/2007  . GASTROESOPHAGEAL REFLUX, NO ESOPHAGITIS 02/05/2007  . OSTEOARTHRITIS, MULTI SITES 02/05/2007     Medications: reviewed  Social Hx:  reports that she quit smoking about 38 years ago. Her smoking use included cigarettes. She has a 5.00 pack-year smoking history. she has never used smokeless tobacco.   Objective:   BP 139/82   Pulse 73   Temp 97.6 F (36.4 C) (Oral)   Ht 5\' 3"  (1.6 m)   Wt 252 lb 9.6 oz (114.6 kg)   SpO2 98%   BMI 44.75 kg/m  Physical Exam  Gen: NAD, alert, cooperative with exam, well-appearing Gastrointestinal: soft, non tender, non distended, bowel sounds present Psych: good insight, normal mood and affect GYN:  External genitalia within normal limits.  Vaginal mucosa pink, moist, normal rugae.  Nonfriable cervix without lesions, no discharge or bleeding noted on speculum exam.  Bimanual exam revealed normal, nongravid uterus.  No cervical motion tenderness. No adnexal masses bilaterally.    Assessment & Plan:  Pelvic pain Unclear exactly where the patient's pain is and she endorsed it was "in the vagina" but appears to be more pelvic in origin.  Abdominal exam benign.  Speculum exam showing what looks to be like vaginal candidiasis, no lesions seen. Patient not sexually active for many years now, not at risk for STDs.  Status post hysterectomy. -We will treat prophylactically with Diflucan for likely vaginal candidiasis -Attempted to get UA to rule out UTI however patient was unable to provide today -If no improvement within the next 4-7 days, patient will return to clinic -Patient also needs appointment to discuss diabetes  Orders  Placed This Encounter  Procedures  . POCT glycosylated hemoglobin (Hb A1C)   Meds ordered this encounter  Medications  . fluconazole (DIFLUCAN) 150 MG tablet    Sig: Take 1 tablet (150 mg total) by mouth once for 1 dose.    Dispense:  1 tablet    Refill:  0    Anders Simmondshristina Cionna Collantes, MD [facility], PGY-3

## 2017-11-14 ENCOUNTER — Other Ambulatory Visit: Payer: Self-pay | Admitting: Cardiology

## 2017-11-14 ENCOUNTER — Encounter: Payer: Self-pay | Admitting: Family Medicine

## 2017-11-14 DIAGNOSIS — R102 Pelvic and perineal pain: Secondary | ICD-10-CM | POA: Insufficient documentation

## 2017-11-14 NOTE — Assessment & Plan Note (Signed)
Unclear exactly where the patient's pain is and she endorsed it was "in the vagina" but appears to be more pelvic in origin.  Abdominal exam benign.  Speculum exam showing what looks to be like vaginal candidiasis, no lesions seen. Patient not sexually active for many years now, not at risk for STDs.  Status post hysterectomy. -We will treat prophylactically with Diflucan for likely vaginal candidiasis -Attempted to get UA to rule out UTI however patient was unable to provide today -If no improvement within the next 4-7 days, patient will return to clinic -Patient also needs appointment to discuss diabetes

## 2017-11-20 ENCOUNTER — Other Ambulatory Visit: Payer: Self-pay | Admitting: Cardiology

## 2017-11-26 NOTE — Progress Notes (Signed)
Subjective:    Patient ID: Meredith [name] , female   DOB: 09-17-1951 , 66 y.o..   MRN: [id number]  HPI  Meredith [name] is a 25 F with PMH of a fib, T2DM, osteoarthritis, CAD, HLD, obesity here for  Chief Complaint  Patient presents with  . Diabetes    1. Chronic Diabetes  Disease Monitoring  Blood Sugar Ranges: 130-200s  Polyuria: no   Visual problems: no   Last hemoglobin A1C:  Lab Results  Component Value Date   HGBA1C 8.5 11/13/2017    Medication Compliance: yes  Medication Side Effects  Hypoglycemia: no   Preventitive Health Care  Eye Exam: overdue   Foot Exam: up to date   Diet pattern: She notes she has been trying to eat less sugar and more vegetables  Exercise: Limited secondary to knee pain    Review of Systems: Per HPI.  Past Medical History: Patient Active Problem List   Diagnosis Date Noted  . Pelvic pain 11/14/2017  . Osteopenia 09/15/2017  . Pain of both hip joints 06/13/2017  . Knee pain, chronic 07/21/2016  . CTS (carpal tunnel syndrome) 06/06/2016  . Numbness of hand 04/06/2016  . Estrogen deficiency 03/29/2016  . CAD (coronary artery disease), native coronary artery 10/26/2015  . Back pain 08/17/2015  . Persistent atrial fibrillation ([place]) 02/23/2015  . PAC (premature atrial contraction) 01/19/2015  . Atrial tachycardia, paroxysmal ([place]) 01/19/2015  . Irregular heart rhythm 11/25/2014  . Healthcare maintenance 03/04/2014  . ASTHMA, INTERMITTENT 09/07/2010  . HERNIA, VENTRAL 04/18/2010  . 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 Diabetes mellitus type II, controlled, with no complications ([place]) 11/65/7903  . HYPERCHOLESTEROLEMIA 02/05/2007  . Obesity 02/05/2007  . DEPRESSION, MAJOR, RECURRENT 02/05/2007  . HYPERTENSION, BENIGN SYSTEMIC 02/05/2007  . GASTROESOPHAGEAL REFLUX, NO ESOPHAGITIS 02/05/2007  . OSTEOARTHRITIS, MULTI SITES 02/05/2007    Medications: reviewed and updated Current Outpatient Medications    Medication Sig Dispense Refill  . ACCU-CHEK SOFTCLIX LANCETS lancets Use as instructed to test blood glucose once daily. ICD-10 code: E11.9 100 each 12  . acetaminophen (ACETAMINOPHEN 8 HOUR) 650 MG CR tablet Take 1 tablet (650 mg total) by mouth every 8 (eight) hours. 30 tablet 0  . amLODipine (NORVASC) 10 MG tablet TAKE 1 TABLET BY MOUTH EVERY DAY 90 tablet 1  . Blood Glucose Monitoring Suppl (ACCU-CHEK AVIVA PLUS) w/Device KIT 1 kit by Does not apply route as directed. ICD-10 code: E11.9 1 kit 0  . ezetimibe (ZETIA) 10 MG tablet TAKE 1 TABLET BY MOUTH EVERY DAY 30 tablet 3  . glucose blood (ACCU-CHEK AVIVA PLUS) test strip Use as instructed test blood glucose once daily. ICD-10 code: E11.9 100 each 12  . KLOR-CON M20 20 MEQ tablet TAKE 1 TABLET (20 MEQ TOTAL) BY MOUTH DAILY. 30 tablet 6  . Lancets Misc. (ACCU-CHEK SOFTCLIX LANCET DEV) KIT Use as directed to test once daily. ICD-10 code: E11.9 1 kit 0  . metFORMIN (GLUCOPHAGE) 1000 MG tablet Take 1 tablet (1,000 mg total) by mouth 2 (two) times daily with a meal. 180 tablet 1  . metoprolol succinate (TOPROL-XL) 50 MG 24 hr tablet TAKE 1 TABLET EVERY DAY WITH OR IMMEDIATELY FOLLOWING A MEAL 135 tablet 0  . polyethylene glycol powder (GLYCOLAX/MIRALAX) powder Take 2 capsules daily until constipation is resolved 255 g 0  . rosuvastatin (CRESTOR) 20 MG tablet Take 1 tablet (20 mg total) by mouth daily. 90 tablet 1  . XARELTO 20 MG TABS tablet TAKE  1 TABLET BY MOUTH EVERY DAY WITH SUPPER (Patient taking differently: Take 20 mg by mouth every day with Supper) 30 tablet 5  . diclofenac sodium (VOLTAREN) 1 % GEL Apply 2 g topically 4 (four) times daily. 100 g 1  . empagliflozin (JARDIANCE) 10 MG TABS tablet Take 10 mg by mouth daily. 90 tablet 0  . hydrochlorothiazide (HYDRODIURIL) 25 MG tablet Take 1 tablet (25 mg total) by mouth daily. 90 tablet 3  . traMADol (ULTRAM) 50 MG tablet Take 1 tablet (50 mg total) by mouth every 6 (six) hours as needed (for  pain). (Patient not taking: Reported on 11/27/2017) 30 tablet 2   No current facility-administered medications for this visit.     Social Hx:  reports that she quit smoking about 38 years ago. Her smoking use included cigarettes. She has a 5.00 pack-year smoking history. she has never used smokeless tobacco.   Objective:   BP 140/88   Pulse 72   Temp 98.1 F (36.7 C) (Oral)   Ht '5\' 3"'  (1.6 m)   Wt 252 lb 9.6 oz (114.6 kg)   SpO2 96%   BMI 44.75 kg/m  Physical Exam  Gen: NAD, alert, cooperative with exam, well-appearing Psych: good insight, normal mood and affect  Assessment & Plan:  Diabetes mellitus type II, controlled, with no complications (HCC) Uncontrolled.  Hemoglobin A1c 8.5 a couple weeks ago.  Likely due to poor nutrition and limited mobility secondary to bilateral knee pain.  Thoroughly discussed all of the options for lowering A1c.  Patient strongly against any injectables including insulin, she would like to try  oral therapy solely for now.  Goal A1c for her age is around 7-8 -Discussed limiting sugars and carbohydrates in diet -Continue metformin 1000 mg twice daily -Start Jardiance 10 mg daily -Follow-up in about 3 months for next A1c check -If A1c remains uncontrolled can consider going up on Jardiance 20 mg daily   Meds ordered this encounter  Medications  . empagliflozin (JARDIANCE) 10 MG TABS tablet    Sig: Take 10 mg by mouth daily.    Dispense:  90 tablet    Refill:  0    Smitty Cords, MD [place], PGY-3

## 2017-11-27 ENCOUNTER — Other Ambulatory Visit: Payer: Self-pay

## 2017-11-27 ENCOUNTER — Ambulatory Visit: Payer: Medicare HMO | Admitting: Family Medicine

## 2017-11-27 ENCOUNTER — Encounter: Payer: Self-pay | Admitting: Family Medicine

## 2017-11-27 DIAGNOSIS — E119 Type 2 diabetes mellitus without complications: Secondary | ICD-10-CM

## 2017-11-27 MED ORDER — DICLOFENAC SODIUM 1 % TD GEL: 2.0000 g | Freq: Four times a day (QID) | TRANSDERMAL | 0 refills | Status: DC

## 2017-11-27 MED ORDER — DICLOFENAC SODIUM 1 % TD GEL: 2.0000 g | Freq: Four times a day (QID) | TRANSDERMAL | 1 refills | Status: DC

## 2017-11-27 MED ORDER — EMPAGLIFLOZIN 10 MG PO TABS: 10.0000 mg | ORAL_TABLET | Freq: Every day | ORAL | 0 refills | Status: DC

## 2017-11-27 NOTE — Patient Instructions (Addendum)
Thank you for coming in today, it was so nice to see you! Today we talked about:    Diabetes: We are starting a medication called Jardiance today. This helps you pee out some excess sugar in your body. This should help your A1C lower to 7 which is your goal. Continue trying to eat less sugar and less fried foods. The more vegetables the better  Check your glucose every morning. If it is lower than 80 please let me know. Your goal sugar will be around the low 100's  Call your eye doctor to schedule an appointment  Please follow up in 3 months for diabetes.   Bring in all your medications or supplements to each appointment for review.   If you have any questions or concerns, please do not hesitate to call the office at [phone]. You can also message me directly via MyChart.   Sincerely,  Meredith Simmondshristina Maliya Marich, MD

## 2017-11-28 NOTE — Assessment & Plan Note (Signed)
Uncontrolled.  Hemoglobin A1c 8.5 a couple weeks ago.  Likely due to poor nutrition and limited mobility secondary to bilateral knee pain.  Thoroughly discussed all of the options for lowering A1c.  Patient strongly against any injectables including insulin, she would like to try  oral therapy solely for now.  Goal A1c for her age is around 7-8 -Discussed limiting sugars and carbohydrates in diet -Continue metformin 1000 mg twice daily -Start Jardiance 10 mg daily -Follow-up in about 3 months for next A1c check -If A1c remains uncontrolled can consider going up on Jardiance 20 mg daily

## 2017-12-16 ENCOUNTER — Other Ambulatory Visit: Payer: Self-pay | Admitting: Cardiology

## 2017-12-19 ENCOUNTER — Telehealth: Payer: Self-pay | Admitting: *Deleted

## 2017-12-19 NOTE — Telephone Encounter (Signed)
Pt lm on nurse line wanting to know if she can take tylenol.   Returned call, pt has a stiff neck and wanted to make sure she could take tylenol with her Diabetes.  Advised that she could and also suggested a heating pad on her neck.    Pt agreeable to plan. Cecilio Ohlrich, Maryjo RochesterJessica Dawn, CMA

## 2018-01-06 NOTE — Progress Notes (Signed)
Cardiology Office Note:    Date:  01/08/2018   ID:  Meredith [name], DOB Sep 02, 1951, MRN [id number]  PCP:  Carlyle Dolly, MD  Cardiologist:  No primary care provider on file.    Referring MD: Carlyle Dolly, MD   Chief Complaint  Patient presents with  . Atrial Fibrillation  . Hypertension  . Hyperlipidemia    History of Present Illness:    Meredith [name] is a 67 y.o. 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  She is here today for followup and is doing well.  She denies any chest pain or pressure, , PND, orthopnea, LE edema, dizziness, palpitations or syncope. She has chronic DOE when going up stairs which she thinks is unchanged.  She is compliant with his meds and is tolerating meds with no SE.    Past Medical History:  Diagnosis Date  . ANEMIA, IRON DEFICIENCY, UNSPEC. 02/05/2007   Qualifier: Diagnosis of  By: Damita Dunnings MD, Phillip Heal    . CAD (coronary artery disease), native coronary artery 02/16/2015   Cath with 20% LCx and RCA  . Colon polyps   . Diabetes mellitus   . Diverticulosis 05/17/12  . DJD (degenerative joint disease) of lumbar spine   . Enteritis   . Gastric ulcer 04/2000  . Hyperlipidemia   . Hypertension   . Irregular heart beat   . Obesity   . Osteoarthritis   . Persistent atrial fibrillation (HCC)    CHADS2VASC score is 5 and on Xarelto  . Renal cyst, left 05/17/12  . Ventral hernia     Past Surgical History:  Procedure Laterality Date  . ABDOMINAL HYSTERECTOMY    . LEFT HEART CATHETERIZATION WITH CORONARY ANGIOGRAM N/A 02/16/2015   Procedure: LEFT HEART CATHETERIZATION WITH CORONARY ANGIOGRAM;  Surgeon: Burnell Blanks, MD;  Location: [facility] CATH LAB;  Service: Cardiovascular;  Laterality: N/A;  . OPERATIVE HYSTEROSCOPY    . TUBAL LIGATION    . UTERINE FIBROID SURGERY      Current Medications: Current Meds  Medication Sig  . ACCU-CHEK  SOFTCLIX LANCETS lancets Use as instructed to test blood glucose once daily. ICD-10 code: E11.9  . acetaminophen (ACETAMINOPHEN 8 HOUR) 650 MG CR tablet Take 1 tablet (650 mg total) by mouth every 8 (eight) hours.  Marland Kitchen amLODipine (NORVASC) 10 MG tablet TAKE 1 TABLET BY MOUTH EVERY DAY  . Blood Glucose Monitoring Suppl (ACCU-CHEK AVIVA PLUS) w/Device KIT 1 kit by Does not apply route as directed. ICD-10 code: E11.9  . ezetimibe (ZETIA) 10 MG tablet TAKE 1 TABLET BY MOUTH EVERY DAY  . glucose blood (ACCU-CHEK AVIVA PLUS) test strip Use as instructed test blood glucose once daily. ICD-10 code: E11.9  . KLOR-CON M20 20 MEQ tablet TAKE 1 TABLET BY MOUTH EVERY DAY  . Lancets Misc. (ACCU-CHEK SOFTCLIX LANCET DEV) KIT Use as directed to test once daily. ICD-10 code: E11.9  . metFORMIN (GLUCOPHAGE) 1000 MG tablet Take 1 tablet (1,000 mg total) by mouth 2 (two) times daily with a meal.  . metoprolol succinate (TOPROL-XL) 50 MG 24 hr tablet TAKE 1 TABLET EVERY DAY WITH OR IMMEDIATELY FOLLOWING A MEAL  . polyethylene glycol powder (GLYCOLAX/MIRALAX) powder Take 2 capsules daily until constipation is resolved  . rosuvastatin (CRESTOR) 20 MG tablet Take 1 tablet (20 mg total) by mouth daily.  . traMADol (ULTRAM) 50 MG tablet Take 1 tablet (50 mg total) by  mouth every 6 (six) hours as needed (for pain).  Alveda Reasons 20 MG TABS tablet TAKE 1 TABLET BY MOUTH EVERY DAY WITH SUPPER     Allergies:   Lisinopril; Flexeril [cyclobenzaprine hcl]; Lipitor [atorvastatin calcium]; and Metaxalone   Social History   Socioeconomic History  . Marital status: Divorced    Spouse name: None  . Number of children: 4  . Years of education: None  . Highest education level: None  Social Needs  . Financial resource strain: None  . Food insecurity - worry: None  . Food insecurity - inability: None  . Transportation needs - medical: None  . Transportation needs - non-medical: None  Occupational History  . Occupation: Research scientist (physical sciences): [employer]  Tobacco Use  . Smoking status: Former Smoker    Packs/day: 1.00    Years: 5.00    Pack years: 5.00    Types: Cigarettes    Last attempt to quit: 12/09/1978    Years since quitting: 39.1  . Smokeless tobacco: Never Used  Substance and Sexual Activity  . Alcohol use: No  . Drug use: No  . Sexual activity: No  Other Topics Concern  . 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 Ducatte, RN, BSN                                                                                                   Family History: The patient's family history includes Cancer in her father and sister; Clotting disorder in her other; Diabetes in her mother; Heart disease in her father and mother; Kidney disease in her mother; Lung cancer in her father; Stomach cancer in her sister. There is no history of Colon cancer.  ROS:   Please see the history of present illness.    ROS  All other systems reviewed and negative.   EKGs/Labs/Other Studies Reviewed:    The following studies were reviewed today: none  EKG:  EKG is ordered today and showed NSR with PACs single and couplets  Recent Labs: 08/09/2017: ALT 13; BUN 16; Creatinine, Ser 0.96; Hemoglobin 13.1; Platelets 202; Potassium 3.7; Sodium 138   Recent Lipid Panel    Component Value Date/Time   CHOL 142 05/16/2017 0918   TRIG 73 05/16/2017 0918   HDL 58 05/16/2017 0918   CHOLHDL 2.4 05/16/2017 0918   CHOLHDL 2.3 05/16/2016 0739   VLDL 17 05/16/2016 0739   LDLCALC 69  05/16/2017 0918   LDLDIRECT 130 (H) 05/13/2012 0917    Physical Exam:  VS:  BP 122/80   Pulse 72   Ht _0  (1.6 m)   Wt 248 lb 6.4 oz (112.7 kg)   SpO2 96%   BMI 44.00 kg/m     Wt Readings from Last 3 Encounters:  01/08/18 248 lb 6.4 oz (112.7 kg)  11/27/17 252 lb 9.6 oz (114.6 kg)  11/13/17 252 lb 9.6 oz (114.6 kg)     GEN:  Well nourished, well developed in no acute distress HEENT: Normal NECK: No JVD; No carotid bruits LYMPHATICS: No lymphadenopathy CARDIAC: RRR, no murmurs, rubs, gallops RESPIRATORY:  Clear to auscultation without rales, wheezing or rhonchi  ABDOMEN: Soft, non-tender, non-distended MUSCULOSKELETAL:  No edema; No deformity  SKIN: Warm and dry NEUROLOGIC:  Alert and oriented x 3 PSYCHIATRIC:  Normal affect   ASSESSMENT:    1. Persistent atrial fibrillation ([place])   2. HYPERTENSION, BENIGN SYSTEMIC   3. Atrial tachycardia, paroxysmal ([place])   4. Coronary artery disease involving native coronary artery of native heart without angina pectoris   5. HYPERCHOLESTEROLEMIA    PLAN:    In order of problems listed above:  1.  Persistent atrial fibrillation - she is maintaining NSR on exam today.  She will continue on Toprol XL 18m daily and Xarelto 26mdaily. I will check a BMET and CBC.  SHe has not had any bleeding problems.  2.  HTN - Her BP is well controlled on exam today.  She will continue on HCTZ 2573maily, Toprol XL 91m47mily and amlodipine 10mg27mly.   3.  Nonsustained atrial tachycardia - this is suppressed on BB.  4.  ASCAD - 20% LCx and RCA by cath.  She denies any anginal symptoms.    5.  Hyperlipidemia with LDL goal < 70.  She will continue on Zetia 10mg 20my and Crestor 20mg d23m.  I will get an FLP and ALT.  Medication Adjustments/Labs and Tests Ordered: Current medicines are reviewed at length with the patient today.  Concerns regarding medicines are outlined above.  No orders of the defined types were placed in this  encounter.  No orders of the defined types were placed in this encounter.   Signed, Sabra Sessler TFransico Him/31/2019 8:39 AM    [facility]

## 2018-01-08 ENCOUNTER — Ambulatory Visit: Payer: Medicare HMO | Admitting: Cardiology

## 2018-01-08 ENCOUNTER — Encounter: Payer: Self-pay | Admitting: Cardiology

## 2018-01-08 VITALS — BP 122/80 | HR 72 | Ht 63.0 in | Wt 248.4 lb

## 2018-01-08 DIAGNOSIS — I4819 Other persistent atrial fibrillation: Secondary | ICD-10-CM

## 2018-01-08 DIAGNOSIS — I4719 Other supraventricular tachycardia: Secondary | ICD-10-CM

## 2018-01-08 DIAGNOSIS — I2510 Atherosclerotic heart disease of native coronary artery without angina pectoris: Secondary | ICD-10-CM | POA: Diagnosis not present

## 2018-01-08 DIAGNOSIS — I10 Essential (primary) hypertension: Secondary | ICD-10-CM

## 2018-01-08 DIAGNOSIS — I471 Supraventricular tachycardia: Secondary | ICD-10-CM

## 2018-01-08 DIAGNOSIS — E7800 Pure hypercholesterolemia, unspecified: Secondary | ICD-10-CM

## 2018-01-08 DIAGNOSIS — I481 Persistent atrial fibrillation: Secondary | ICD-10-CM

## 2018-01-08 LAB — BASIC METABOLIC PANEL
BUN/Creatinine Ratio: 21 (ref 12–28)
BUN: 18 mg/dL (ref 8–27)
CO2: 26 mmol/L (ref 20–29)
CREATININE: 0.87 mg/dL (ref 0.57–1.00)
Calcium: 10 mg/dL (ref 8.7–10.3)
Chloride: 97 mmol/L (ref 96–106)
GFR calc Af Amer: 80 mL/min/{1.73_m2} (ref >59)
GFR calc non Af Amer: 69 mL/min/{1.73_m2} (ref >59)
GLUCOSE: 158 mg/dL — AB (ref 65–99)
Potassium: 3.5 mmol/L (ref 3.5–5.2)
SODIUM: 140 mmol/L (ref 134–144)

## 2018-01-08 LAB — CBC
Hematocrit: 38.1 % (ref 34.0–46.6)
Hemoglobin: 13.1 g/dL (ref 11.1–15.9)
MCH: 26.3 pg — ABNORMAL LOW (ref 26.6–33.0)
MCHC: 34.4 g/dL (ref 31.5–35.7)
MCV: 77 fL — AB (ref 79–97)
PLATELETS: 217 10*3/uL (ref 150–379)
RBC: 4.98 x10E6/uL (ref 3.77–5.28)
RDW: 14.4 % (ref 12.3–15.4)
WBC: 7 10*3/uL (ref 3.4–10.8)

## 2018-01-08 LAB — LIPID PANEL
CHOL/HDL RATIO: 2.8 ratio (ref 0.0–4.4)
Cholesterol, Total: 130 mg/dL (ref 100–199)
HDL: 47 mg/dL (ref >39)
LDL CALC: 64 mg/dL (ref 0–99)
Triglycerides: 96 mg/dL (ref 0–149)
VLDL CHOLESTEROL CAL: 19 mg/dL (ref 5–40)

## 2018-01-08 LAB — HEPATIC FUNCTION PANEL
ALBUMIN: 4.3 g/dL (ref 3.6–4.8)
ALT: 9 IU/L (ref 0–32)
AST: 9 IU/L (ref 0–40)
Alkaline Phosphatase: 50 IU/L (ref 39–117)
BILIRUBIN, DIRECT: 0.11 mg/dL (ref 0.00–0.40)
Bilirubin Total: 0.3 mg/dL (ref 0.0–1.2)
Total Protein: 7 g/dL (ref 6.0–8.5)

## 2018-01-08 NOTE — Patient Instructions (Signed)
Medication Instructions:  Your physician recommends that you continue on your current medications as directed. Please refer to the Current Medication list given to you today.  Labwork: Today for kidney function, complete blood count, liver function, and fasting lipids  Testing/Procedures: None ordered   Follow-Up: Your physician wants you to follow-up in: 6 months with Dr. Turner. You will receive a reminder letter in the mail two months in advance. If you don't receive a letter, please call our office to schedule the follow-up appointment.  Any Other Special Instructions Will Be Listed Below (If Applicable).     If you need a refill on your cardiac medications before your next appointment, please call your pharmacy.   

## 2018-01-09 ENCOUNTER — Telehealth: Payer: Self-pay

## 2018-01-09 DIAGNOSIS — E876 Hypokalemia: Secondary | ICD-10-CM

## 2018-01-09 MED ORDER — POTASSIUM CHLORIDE ER 10 MEQ PO TBCR: 20.0000 meq | EXTENDED_RELEASE_TABLET | Freq: Two times a day (BID) | ORAL | 3 refills | Status: DC

## 2018-01-09 MED ORDER — POTASSIUM CHLORIDE ER 10 MEQ PO TBCR: 20.0000 meq | EXTENDED_RELEASE_TABLET | Freq: Every day | ORAL | 1 refills | Status: DC

## 2018-01-09 NOTE — Telephone Encounter (Signed)
Notes recorded by Phineas Semenobertson, Kaylany Tesoriero, RN on 01/09/2018 at 4:16 PM EST Patient made aware of results and instructed to INCREASE kdur to 20 meq two times a day. Patient scheduled for repeat bmet on 01/16/18. Patient in agreement with plan, verbalized understanding and thanked me for the call.     Notes recorded by Quintella Reicherturner, Traci R, MD on 01/09/2018 at 10:42 AM EST Potassium borderline low - increase Kdur to 20meq BID and repeat BMET in 1 week

## 2018-01-16 ENCOUNTER — Other Ambulatory Visit: Payer: Medicare HMO | Admitting: *Deleted

## 2018-01-16 DIAGNOSIS — E876 Hypokalemia: Secondary | ICD-10-CM

## 2018-01-17 LAB — BASIC METABOLIC PANEL
BUN/Creatinine Ratio: 16 (ref 12–28)
BUN: 16 mg/dL (ref 8–27)
CHLORIDE: 101 mmol/L (ref 96–106)
CO2: 26 mmol/L (ref 20–29)
CREATININE: 0.97 mg/dL (ref 0.57–1.00)
Calcium: 9.8 mg/dL (ref 8.7–10.3)
GFR calc Af Amer: 70 mL/min/{1.73_m2} (ref >59)
GFR calc non Af Amer: 61 mL/min/{1.73_m2} (ref >59)
GLUCOSE: 159 mg/dL — AB (ref 65–99)
Potassium: 3.4 mmol/L — ABNORMAL LOW (ref 3.5–5.2)
Sodium: 143 mmol/L (ref 134–144)

## 2018-01-19 ENCOUNTER — Other Ambulatory Visit: Payer: Self-pay | Admitting: *Deleted

## 2018-01-19 DIAGNOSIS — E876 Hypokalemia: Secondary | ICD-10-CM

## 2018-01-19 MED ORDER — POTASSIUM CHLORIDE ER 10 MEQ PO TBCR: 20.0000 meq | EXTENDED_RELEASE_TABLET | Freq: Two times a day (BID) | ORAL | 3 refills | Status: DC

## 2018-01-22 DIAGNOSIS — Z7984 Long term (current) use of oral hypoglycemic drugs: Secondary | ICD-10-CM | POA: Diagnosis not present

## 2018-01-22 DIAGNOSIS — H524 Presbyopia: Secondary | ICD-10-CM | POA: Diagnosis not present

## 2018-01-22 DIAGNOSIS — H2513 Age-related nuclear cataract, bilateral: Secondary | ICD-10-CM | POA: Diagnosis not present

## 2018-01-22 DIAGNOSIS — E119 Type 2 diabetes mellitus without complications: Secondary | ICD-10-CM | POA: Diagnosis not present

## 2018-01-22 DIAGNOSIS — H52203 Unspecified astigmatism, bilateral: Secondary | ICD-10-CM | POA: Diagnosis not present

## 2018-01-29 ENCOUNTER — Other Ambulatory Visit: Payer: Medicare HMO | Admitting: *Deleted

## 2018-01-29 DIAGNOSIS — E876 Hypokalemia: Secondary | ICD-10-CM | POA: Diagnosis not present

## 2018-01-29 LAB — BASIC METABOLIC PANEL
BUN / CREAT RATIO: 18 (ref 12–28)
BUN: 16 mg/dL (ref 8–27)
CHLORIDE: 101 mmol/L (ref 96–106)
CO2: 24 mmol/L (ref 20–29)
Calcium: 10 mg/dL (ref 8.7–10.3)
Creatinine, Ser: 0.87 mg/dL (ref 0.57–1.00)
GFR calc Af Amer: 80 mL/min/{1.73_m2} (ref >59)
GFR calc non Af Amer: 69 mL/min/{1.73_m2} (ref >59)
Glucose: 134 mg/dL — ABNORMAL HIGH (ref 65–99)
POTASSIUM: 4.4 mmol/L (ref 3.5–5.2)
Sodium: 141 mmol/L (ref 134–144)

## 2018-01-30 ENCOUNTER — Other Ambulatory Visit: Payer: Self-pay | Admitting: Family Medicine

## 2018-01-31 ENCOUNTER — Other Ambulatory Visit: Payer: Self-pay | Admitting: Internal Medicine

## 2018-02-03 ENCOUNTER — Other Ambulatory Visit: Payer: Self-pay | Admitting: Family Medicine

## 2018-03-02 ENCOUNTER — Other Ambulatory Visit: Payer: Self-pay | Admitting: Family Medicine

## 2018-03-06 ENCOUNTER — Ambulatory Visit (INDEPENDENT_AMBULATORY_CARE_PROVIDER_SITE_OTHER): Payer: Medicare HMO | Admitting: Internal Medicine

## 2018-03-06 ENCOUNTER — Other Ambulatory Visit: Payer: Self-pay

## 2018-03-06 ENCOUNTER — Encounter: Payer: Self-pay | Admitting: Internal Medicine

## 2018-03-06 VITALS — BP 122/78 | HR 61 | Temp 97.9°F | Wt 244.2 lb

## 2018-03-06 DIAGNOSIS — M545 Low back pain, unspecified: Secondary | ICD-10-CM

## 2018-03-06 MED ORDER — BACLOFEN 10 MG PO TABS: 10.0000 mg | ORAL_TABLET | Freq: Three times a day (TID) | ORAL | 0 refills | Status: DC

## 2018-03-06 NOTE — Progress Notes (Signed)
Subjective:    ESABELLA STOCKINGER - 67 y.o. female MRN [id number]  Date of birth: 1951-11-04  HPI  EVYNN BOUTELLE is here for back pain. Noted to have history of DDD of lumbar spine.   BACK PAIN  Back pain began 3 days ago. Pain is described as sharp pain located on right side of lumbar back. Worsens with movement. Pain improves with rest.  Patient has tried tylenol.  Pain radiates: no. History of trauma or injury: reports that she moved furniture within the past week.  Patient believes might be causing their pain: unsure.   Prior history of similar pain: no History of cancer: no Weak immune system:  no History of IV drug use: no History of steroid use: no History of nephrolithiasis: no   Symptoms Incontinence of bowel or bladder:  no Numbness of leg: no Fever: no Rest or Night pain: no Weight Loss:  no Rash: no Hematuria: no Dysuria: no    -  reports that she quit smoking about 39 years ago. Her smoking use included cigarettes. She has a 5.00 pack-year smoking history. She has never used smokeless tobacco. - Review of Systems: Per HPI. - Past Medical History: Patient Active Problem List   Diagnosis Date Noted  . Pelvic pain 11/14/2017  . Osteopenia 09/15/2017  . Pain of both hip joints 06/13/2017  . Knee pain, chronic 07/21/2016  . CTS (carpal tunnel syndrome) 06/06/2016  . Numbness of hand 04/06/2016  . Estrogen deficiency 03/29/2016  . CAD (coronary artery disease), native coronary artery 10/26/2015  . Back pain 08/17/2015  . Persistent atrial fibrillation (HCC) 02/23/2015  . PAC (premature atrial contraction) 01/19/2015  . Atrial tachycardia, paroxysmal (HCC) 01/19/2015  . Irregular heart rhythm 11/25/2014  . Healthcare maintenance 03/04/2014  . ASTHMA, INTERMITTENT 09/07/2010  . HERNIA, VENTRAL 04/18/2010  . 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 Diabetes mellitus type II, controlled, with no complications (HCC) 02/05/2007  .  HYPERCHOLESTEROLEMIA 02/05/2007  . Obesity 02/05/2007  . DEPRESSION, MAJOR, RECURRENT 02/05/2007  . HYPERTENSION, BENIGN SYSTEMIC 02/05/2007  . GASTROESOPHAGEAL REFLUX, NO ESOPHAGITIS 02/05/2007  . OSTEOARTHRITIS, MULTI SITES 02/05/2007   - Medications: reviewed and updated   Objective:   Physical Exam BP 122/78 (BP Location: Left Arm)   Pulse 61   Temp 97.9 F (36.6 C) (Oral)   Wt 244 lb 3.2 oz (110.8 kg)   SpO2 99%   BMI 43.26 kg/m  Gen: NAD, alert, cooperative with exam, well-appearing CV: RRR, good S1/S2, no murmur Resp: CTABL, no wheezes, non-labored MSK: Right lumbar paraspinal muscles feel tight compared to left. No CVA tenderness. Good ROM at lumbar spine. No TTP over lumbar spine.  Skin: no rashes, normal turgor  Neuro: Strength 5/5 in all extremities. Normal gait.  Psych: good insight, alert and oriented     Assessment & Plan:   1. Acute right-sided low back pain without sciatica Suspect most likely related to muscle strain given history of moving furniture and pain that presents with movement. Considered nephrolithiasis, however would anticipate much more severe pain. Patient also without pain over the kidneys and without hematuria or urinary symptoms. No rash and pain is not dermatomal to suggest herpes zoster. No red flag symptoms concerning for spinal cord compression.  - baclofen (LIORESAL) 10 MG tablet; Take 1 tablet (10 mg total) by mouth 3 (three) times daily.  Dispense: 30 each; Refill: 0 -tylenol and ibuprofen for pain -heat to the area  -return if pain not improving in  2 weeks -instructed to seek medical attention for significant worsening of pain, hematuria/urinary symptoms, numbness/weakness in lower extremities, bowel incontinence, bladder incontinence or retention   Marcy Sirenatherine Aldrich Lloyd, D.O. 03/06/2018, 12:02 PM PGY-3, [facility]

## 2018-03-06 NOTE — Patient Instructions (Addendum)
I think your back pain is related to a pulled muscle since it gets worse with movement and better with rest. Your muscles feel tighter on that side on my exam. Apply heat to the area. You can take tylenol or ibuprofen for pain. I have also prescribed a muscle relaxer for you. Take this at nighttime.   Sometimes pain in this area can be concerning for kidney stones. However, I would expect you to have much more significant pain even with rest. Please go to the ER if pain intensifies or you see blood in your urine, have trouble urinating, or have pain with urination.

## 2018-04-04 ENCOUNTER — Other Ambulatory Visit: Payer: Self-pay | Admitting: Cardiology

## 2018-04-07 NOTE — Telephone Encounter (Signed)
Xarelto  refill request received; pt is 67 years old, Wt-110.8kg, Crea-0.87 on 01/29/18, last seen by Dr. Mayford Knife on 01/08/18, CrCl-108.83ml/min.

## 2018-04-19 ENCOUNTER — Encounter (HOSPITAL_COMMUNITY): Payer: Self-pay | Admitting: *Deleted

## 2018-04-19 ENCOUNTER — Ambulatory Visit (HOSPITAL_COMMUNITY)
Admission: EM | Admit: 2018-04-19 | Discharge: 2018-04-19 | Disposition: A | Discharge status: Home / Self Care | Payer: Medicare HMO | Attending: Internal Medicine | Admitting: Internal Medicine

## 2018-04-19 ENCOUNTER — Other Ambulatory Visit: Payer: Self-pay | Admitting: Family Medicine

## 2018-04-19 DIAGNOSIS — W57XXXA Bitten or stung by nonvenomous insect and other nonvenomous arthropods, initial encounter: Secondary | ICD-10-CM

## 2018-04-19 DIAGNOSIS — S30861A Insect bite (nonvenomous) of abdominal wall, initial encounter: Secondary | ICD-10-CM | POA: Diagnosis not present

## 2018-04-19 MED ORDER — TRIAMCINOLONE ACETONIDE 0.1 % EX CREA: 1.0000 "application " | TOPICAL_CREAM | Freq: Two times a day (BID) | CUTANEOUS | 0 refills | Status: AC

## 2018-04-19 MED ORDER — DOXYCYCLINE HYCLATE 100 MG PO CAPS: 100.0000 mg | ORAL_CAPSULE | Freq: Two times a day (BID) | ORAL | 0 refills | Status: DC

## 2018-04-19 NOTE — ED Triage Notes (Signed)
Reports possible tick bite to left lower abd 3 days ago - states saw insect & thought it was a scab, so she picked it off.  Now site red and pruritic.

## 2018-04-19 NOTE — Discharge Instructions (Addendum)
You most likely had a tick bite. The black scab that you picked off was most likely the tick. I will treat you today for tick with doxycycline, take this twice a day for 10 days. I also send in a steroid cream to put on the rash.

## 2018-04-19 NOTE — ED Provider Notes (Addendum)
[place]    CSN: [id number] Arrival date & time: 04/19/18  1847     History   Chief Complaint Chief Complaint  Patient presents with  . Insect Bite    HPI Meredith [name] is a 67 y.o. female.   With multiple history, presents today for a rash to her left lower abdomen. Rash is pruritic. Patient noticed the rash 1 week ago. She saw something black in the center and thought it was a scab so she picked it off. She doesn't know it was a tick or not.  She denies fever, headache, nausea, fatigue or body ache. Patient denies SOB or CP. States the rash is warm to the touch.      Past Medical History:  Diagnosis Date  . ANEMIA, IRON DEFICIENCY, UNSPEC. 02/05/2007   Qualifier: Diagnosis of  By: Damita Dunnings MD, Phillip Heal    . CAD (coronary artery disease), native coronary artery 02/16/2015   Cath with 20% LCx and RCA  . Colon polyps   . Diabetes mellitus   . Diverticulosis 05/17/12  . DJD (degenerative joint disease) of lumbar spine   . Enteritis   . Gastric ulcer 04/2000  . Hyperlipidemia   . Hypertension   . Irregular heart beat   . Obesity   . Osteoarthritis   . Persistent atrial fibrillation (HCC)    CHADS2VASC score is 5 and on Xarelto  . Renal cyst, left 05/17/12  . Ventral hernia     Patient Active Problem List   Diagnosis Date Noted  . Pelvic pain 11/14/2017  . Osteopenia 09/15/2017  . Pain of both hip joints 06/13/2017  . Knee pain, chronic 07/21/2016  . CTS (carpal tunnel syndrome) 06/06/2016  . Numbness of hand 04/06/2016  . Estrogen deficiency 03/29/2016  . CAD (coronary artery disease), native coronary artery 10/26/2015  . Back pain 08/17/2015  . Persistent atrial fibrillation ([place]) 02/23/2015  . PAC (premature atrial contraction) 01/19/2015  . Atrial tachycardia, paroxysmal ([place]) 01/19/2015  . Irregular heart rhythm 11/25/2014  . Healthcare maintenance 03/04/2014  . ASTHMA, INTERMITTENT 09/07/2010  . HERNIA, VENTRAL 04/18/2010  . DEGENERATIVE DISC  DISEASE, LUMBAR SPINE 04/18/2010  . Diabetes mellitus type II, controlled, with no complications ([place]) 99/83/3825  . HYPERCHOLESTEROLEMIA 02/05/2007  . Obesity 02/05/2007  . DEPRESSION, MAJOR, RECURRENT 02/05/2007  . HYPERTENSION, BENIGN SYSTEMIC 02/05/2007  . GASTROESOPHAGEAL REFLUX, NO ESOPHAGITIS 02/05/2007  . OSTEOARTHRITIS, MULTI SITES 02/05/2007    Past Surgical History:  Procedure Laterality Date  . ABDOMINAL HYSTERECTOMY    . LEFT HEART CATHETERIZATION WITH CORONARY ANGIOGRAM N/A 02/16/2015   Procedure: LEFT HEART CATHETERIZATION WITH CORONARY ANGIOGRAM;  Surgeon: Burnell Blanks, MD;  Location: [facility] CATH LAB;  Service: Cardiovascular;  Laterality: N/A;  . OPERATIVE HYSTEROSCOPY    . TUBAL LIGATION    . UTERINE FIBROID SURGERY      OB History   None      Home Medications    Prior to Admission medications   Medication Sig Start Date End Date Taking? Authorizing Provider  ACCU-CHEK SOFTCLIX LANCETS lancets Use as instructed to test blood glucose once daily. ICD-10 code: E11.9 09/17/17   McDiarmid, Blane Ohara, MD  acetaminophen (ACETAMINOPHEN 8 HOUR) 650 MG CR tablet Take 1 tablet (650 mg total) by mouth every 8 (eight) hours. 08/09/17   Caccavale, Sophia, PA-C  amLODipine (NORVASC) 10 MG tablet TAKE 1 TABLET BY MOUTH EVERY DAY 11/20/17   Sueanne Margarita, MD  baclofen (LIORESAL) 10 MG tablet Take 1 tablet (10  mg total) by mouth 3 (three) times daily. 03/06/18   Nicolette Bang, DO  Blood Glucose Monitoring Suppl (ACCU-CHEK AVIVA PLUS) w/Device KIT 1 kit by Does not apply route as directed. ICD-10 code: E11.9 09/17/17   McDiarmid, Blane Ohara, MD  doxycycline (VIBRAMYCIN) 100 MG capsule Take 1 capsule (100 mg total) by mouth 2 (two) times daily for 10 days. 04/19/18 04/29/18  Barry Dienes, NP  ezetimibe (ZETIA) 10 MG tablet TAKE 1 TABLET BY MOUTH EVERY DAY 01/30/18   Carlyle Dolly, MD  glucose blood (ACCU-CHEK AVIVA PLUS) test strip Use as instructed test blood glucose  once daily. ICD-10 code: E11.9 09/17/17   McDiarmid, Blane Ohara, MD  hydrochlorothiazide (HYDRODIURIL) 25 MG tablet Take 1 tablet (25 mg total) by mouth daily. 05/16/17 08/14/17  Sueanne Margarita, MD  JARDIANCE 10 MG TABS tablet TAKE 1 TABLET BY MOUTH DAILY 03/02/18   Carlyle Dolly, MD  KLOR-CON 10 10 MEQ tablet TAKE 2 TABLETS (20 MEQ TOTAL) BY MOUTH 2 (TWO) TIMES DAILY. 04/07/18   Sueanne Margarita, MD  KLOR-CON M20 20 MEQ tablet TAKE 1 TABLET BY MOUTH EVERY DAY 12/16/17   Sueanne Margarita, MD  Lancets Misc. (ACCU-CHEK SOFTCLIX LANCET DEV) KIT Use as directed to test once daily. ICD-10 code: E11.9 09/17/17   McDiarmid, Blane Ohara, MD  metFORMIN (GLUCOPHAGE) 1000 MG tablet TAKE 1 TABLET BY MOUTH TWICE A DAY WITH MEALS 02/02/18   Carlyle Dolly, MD  metoprolol succinate (TOPROL-XL) 50 MG 24 hr tablet TAKE 1 TABLET EVERY DAY WITH OR IMMEDIATELY FOLLOWING A MEAL 02/03/18   Carlyle Dolly, MD  polyethylene glycol powder (GLYCOLAX/MIRALAX) powder Take 2 capsules daily until constipation is resolved 08/09/17   Caccavale, Sophia, PA-C  potassium chloride (K-DUR) 10 MEQ tablet Take 2 tablets (20 mEq total) by mouth 2 (two) times daily. 01/19/18 04/19/18  Sueanne Margarita, MD  rosuvastatin (CRESTOR) 20 MG tablet TAKE 1 TABLET BY MOUTH EVERY DAY 04/07/18   Sueanne Margarita, MD  traMADol (ULTRAM) 50 MG tablet Take 1 tablet (50 mg total) by mouth every 6 (six) hours as needed (for pain). 08/18/17   Carlyle Dolly, MD  triamcinolone cream (KENALOG) 0.1 % Apply 1 application topically 2 (two) times daily for 7 days. 04/19/18 04/26/18  Barry Dienes, NP  XARELTO 20 MG TABS tablet TAKE 1 TABLET BY MOUTH EVERY DAY WITH SUPPER 04/07/18   Sueanne Margarita, MD    Family History Family History  Problem Relation Age of Onset  . Diabetes Mother   . Kidney disease Mother   . Heart disease Mother   . Lung cancer Father   . Heart disease Father   . Cancer Father   . Stomach cancer Sister   . Cancer Sister   . Clotting  disorder Other        neice  . Colon cancer Neg Hx     Social History Social History   Tobacco Use  . Smoking status: Former Smoker    Packs/day: 1.00    Years: 5.00    Pack years: 5.00    Types: Cigarettes    Last attempt to quit: 12/09/1978    Years since quitting: 39.3  . Smokeless tobacco: Never Used  Substance Use Topics  . Alcohol use: No  . Drug use: No     Allergies   Lisinopril; Flexeril [cyclobenzaprine hcl]; Lipitor [atorvastatin calcium]; and Metaxalone   Review of Systems Review of Systems  Constitutional:  As stated in the HPI     Physical Exam Triage Vital Signs ED Triage Vitals  Enc Vitals Group     BP 04/19/18 1926 (!) 155/85     Pulse Rate 04/19/18 1926 78     Resp 04/19/18 1926 18     Temp 04/19/18 1926 97.9 F (36.6 C)     Temp Source 04/19/18 1926 Oral     SpO2 04/19/18 1926 95 %     Weight --      Height --      Head Circumference --      Peak Flow --      Pain Score 04/19/18 1927 0     Pain Loc --      Pain Edu? --      Excl. in [place]? --    No data found.  Updated Vital Signs BP (!) 155/85   Pulse 78   Temp 97.9 F (36.6 C) (Oral)   Resp 18   SpO2 95%   Visual Acuity Right Eye Distance:   Left Eye Distance:   Bilateral Distance:    Right Eye Near:   Left Eye Near:    Bilateral Near:     Physical Exam  Constitutional: She is oriented to person, place, and time. She appears well-developed and well-nourished. No distress.  Cardiovascular: Normal rate, regular rhythm and normal heart sounds.  Pulmonary/Chest: Effort normal and breath sounds normal. She has no wheezes.  Neurological: She is alert and oriented to person, place, and time.  Skin: She is not diaphoretic.  Erythematous papule rash on left lower abdomen with surrounding redness extending the erythema to 8 cm in diameter. Warm to touch but non-tender.  Nursing note and vitals reviewed.    UC Treatments / Results  Labs (all labs ordered are listed, but  only abnormal results are displayed) Labs Reviewed - No data to display  EKG None  Radiology No results found.  Procedures Procedures (including critical care time)  Medications Ordered in UC Medications - No data to display  Initial Impression / Assessment and Plan / UC Course  I have reviewed the triage vital signs and the nursing notes.  Pertinent labs & imaging results that were available during my care of the patient were reviewed by me and considered in my medical decision making (see chart for details).   Final Clinical Impressions(s) / UC Diagnoses   Final diagnoses:  Insect bite of abdominal wall, initial encounter   Patient most likely had a tick bit. She does not have any systemic symptoms. Rash is concerning for early onset of an infection. Will treat empirically with doxycycline 100 mg BID x 10 days. RX for kenalog 0.1% cream also given to apply topically. Instructed to f/u with PCP for no improvement.     Discharge Instructions     You most likely had a tick bite. The black scab that you picked off was most likely the tick. I will treat you today for tick with doxycycline, take this twice a day for 10 days. I also send in a steroid cream to put on the rash.     ED Prescriptions    Medication Sig Dispense Auth. Provider   doxycycline (VIBRAMYCIN) 100 MG capsule Take 1 capsule (100 mg total) by mouth 2 (two) times daily for 10 days. 20 capsule Barry Dienes, NP   triamcinolone cream (KENALOG) 0.1 % Apply 1 application topically 2 (two) times daily for 7 days. 30 g Barry Dienes, NP  Controlled Substance Prescriptions [place] Controlled Substance Registry consulted? Not Applicable      Barry Dienes, NP 04/19/18 1954

## 2018-04-29 ENCOUNTER — Emergency Department (HOSPITAL_COMMUNITY)
Admission: EM | Admit: 2018-04-29 | Discharge: 2018-04-29 | Disposition: A | Discharge status: Home / Self Care | Payer: Medicare HMO | Attending: Emergency Medicine | Admitting: Emergency Medicine

## 2018-04-29 ENCOUNTER — Encounter (HOSPITAL_COMMUNITY): Payer: Self-pay | Admitting: Emergency Medicine

## 2018-04-29 ENCOUNTER — Other Ambulatory Visit: Payer: Self-pay | Admitting: Cardiology

## 2018-04-29 DIAGNOSIS — Z79899 Other long term (current) drug therapy: Secondary | ICD-10-CM | POA: Insufficient documentation

## 2018-04-29 DIAGNOSIS — I2510 Atherosclerotic heart disease of native coronary artery without angina pectoris: Secondary | ICD-10-CM | POA: Diagnosis not present

## 2018-04-29 DIAGNOSIS — L509 Urticaria, unspecified: Secondary | ICD-10-CM

## 2018-04-29 DIAGNOSIS — R21 Rash and other nonspecific skin eruption: Secondary | ICD-10-CM | POA: Diagnosis not present

## 2018-04-29 DIAGNOSIS — E119 Type 2 diabetes mellitus without complications: Secondary | ICD-10-CM | POA: Diagnosis not present

## 2018-04-29 DIAGNOSIS — Z87891 Personal history of nicotine dependence: Secondary | ICD-10-CM | POA: Diagnosis not present

## 2018-04-29 DIAGNOSIS — Z7984 Long term (current) use of oral hypoglycemic drugs: Secondary | ICD-10-CM | POA: Insufficient documentation

## 2018-04-29 DIAGNOSIS — I10 Essential (primary) hypertension: Secondary | ICD-10-CM | POA: Insufficient documentation

## 2018-04-29 DIAGNOSIS — R0602 Shortness of breath: Secondary | ICD-10-CM | POA: Diagnosis not present

## 2018-04-29 MED ORDER — FAMOTIDINE 20 MG PO TABS: ORAL_TABLET | ORAL | 0 refills | Status: DC

## 2018-04-29 MED ORDER — DIPHENHYDRAMINE HCL 25 MG PO TABS: ORAL_TABLET | ORAL | 0 refills | Status: DC

## 2018-04-29 NOTE — ED Triage Notes (Signed)
Pt presents with rash to R arm and buttocks since Sunday; pt seen at urgent care and given cream and antibiotics but states she isnt better; pt denies any new soaps, washes, or medications;

## 2018-04-29 NOTE — Discharge Instructions (Signed)
Continue using topical steroids for rash. You can stop taking Doxycycline. Follow up with your doctor for recheck if rash persists. Return to the emergency department with any shortness of breath, difficulty swallowing or facial swelling.

## 2018-04-29 NOTE — ED Provider Notes (Signed)
[facility] EMERGENCY DEPARTMENT Provider Note   CSN: [id number] Arrival date & time: 04/29/18  0150     History   Chief Complaint Chief Complaint  Patient presents with  . Rash    HPI Meredith [name] is a 67 y.o. female.  Patient presents with concern for rash that has been intermittent for the past week. No modifying factors identified. She was seen at Urgent Care initially and prescribed topical steroids and Doxycycline. Tonight the rash again flared up and she felt mild SOB and throat tightening. Symptoms have improved significantly without intervention and she reports only a residual rash now. No nausea or vomiting.   The history is provided by the patient. No language interpreter was used.  Rash      Past Medical History:  Diagnosis Date  . ANEMIA, IRON DEFICIENCY, UNSPEC. 02/05/2007   Qualifier: Diagnosis of  By: Damita Dunnings MD, Phillip Heal    . CAD (coronary artery disease), native coronary artery 02/16/2015   Cath with 20% LCx and RCA  . Colon polyps   . Diabetes mellitus   . Diverticulosis 05/17/12  . DJD (degenerative joint disease) of lumbar spine   . Enteritis   . Gastric ulcer 04/2000  . Hyperlipidemia   . Hypertension   . Irregular heart beat   . Obesity   . Osteoarthritis   . Persistent atrial fibrillation (HCC)    CHADS2VASC score is 5 and on Xarelto  . Renal cyst, left 05/17/12  . Ventral hernia     Patient Active Problem List   Diagnosis Date Noted  . Pelvic pain 11/14/2017  . Osteopenia 09/15/2017  . Pain of both hip joints 06/13/2017  . Knee pain, chronic 07/21/2016  . CTS (carpal tunnel syndrome) 06/06/2016  . Numbness of hand 04/06/2016  . Estrogen deficiency 03/29/2016  . CAD (coronary artery disease), native coronary artery 10/26/2015  . Back pain 08/17/2015  . Persistent atrial fibrillation ([place]) 02/23/2015  . PAC (premature atrial contraction) 01/19/2015  . Atrial tachycardia, paroxysmal ([place]) 01/19/2015  . Irregular heart  rhythm 11/25/2014  . Healthcare maintenance 03/04/2014  . ASTHMA, INTERMITTENT 09/07/2010  . HERNIA, VENTRAL 04/18/2010  . 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 Diabetes mellitus type II, controlled, with no complications ([place]) 54/27/0623  . HYPERCHOLESTEROLEMIA 02/05/2007  . Obesity 02/05/2007  . DEPRESSION, MAJOR, RECURRENT 02/05/2007  . HYPERTENSION, BENIGN SYSTEMIC 02/05/2007  . GASTROESOPHAGEAL REFLUX, NO ESOPHAGITIS 02/05/2007  . OSTEOARTHRITIS, MULTI SITES 02/05/2007    Past Surgical History:  Procedure Laterality Date  . ABDOMINAL HYSTERECTOMY    . LEFT HEART CATHETERIZATION WITH CORONARY ANGIOGRAM N/A 02/16/2015   Procedure: LEFT HEART CATHETERIZATION WITH CORONARY ANGIOGRAM;  Surgeon: Burnell Blanks, MD;  Location: [facility] C[facility] LAB;  Service: Cardiovascular;  Laterality: N/A;  . OPERATIVE HYSTEROSCOPY    . TUBAL LIGATION    . UTERINE FIBROID SURGERY       OB History   None      Home Medications    Prior to Admission medications   Medication Sig Start Date End Date Taking? Authorizing Provider  ACCU-CHEK SOFTCLIX LANCETS lancets Use as instructed to test blood glucose once daily. ICD-10 code: E11.9 09/17/17   McDiarmid, Blane Ohara, MD  acetaminophen (ACETAMINOPHEN 8 HOUR) 650 MG CR tablet Take 1 tablet (650 mg total) by mouth every 8 (eight) hours. 08/09/17   Caccavale, Sophia, PA-C  amLODipine (NORVASC) 10 MG tablet TAKE 1 TABLET BY MOUTH EVERY DAY 11/20/17   Sueanne Margarita, MD  baclofen (LIORESAL) 10 MG tablet Take 1 tablet (10 mg total) by mouth 3 (three) times daily. 03/06/18   Nicolette Bang, DO  Blood Glucose Monitoring Suppl (ACCU-CHEK AVIVA PLUS) w/Device KIT 1 kit by Does not apply route as directed. ICD-10 code: E11.9 09/17/17   McDiarmid, Blane Ohara, MD  doxycycline (VIBRAMYCIN) 100 MG capsule Take 1 capsule (100 mg total) by mouth 2 (two) times daily for 10 days. 04/19/18 04/29/18  Barry Dienes, NP  ezetimibe (ZETIA) 10 MG tablet TAKE 1  TABLET BY MOUTH EVERY DAY 04/20/18   Carlyle Dolly, MD  glucose blood (ACCU-CHEK AVIVA PLUS) test strip Use as instructed test blood glucose once daily. ICD-10 code: E11.9 09/17/17   McDiarmid, Blane Ohara, MD  hydrochlorothiazide (HYDRODIURIL) 25 MG tablet Take 1 tablet (25 mg total) by mouth daily. 05/16/17 08/14/17  Sueanne Margarita, MD  JARDIANCE 10 MG TABS tablet TAKE 1 TABLET BY MOUTH DAILY 03/02/18   Carlyle Dolly, MD  KLOR-CON 10 10 MEQ tablet TAKE 2 TABLETS (20 MEQ TOTAL) BY MOUTH 2 (TWO) TIMES DAILY. 04/07/18   Sueanne Margarita, MD  KLOR-CON M20 20 MEQ tablet TAKE 1 TABLET BY MOUTH EVERY DAY 12/16/17   Sueanne Margarita, MD  Lancets Misc. (ACCU-CHEK SOFTCLIX LANCET DEV) KIT Use as directed to test once daily. ICD-10 code: E11.9 09/17/17   McDiarmid, Blane Ohara, MD  metFORMIN (GLUCOPHAGE) 1000 MG tablet TAKE 1 TABLET BY MOUTH TWICE A DAY WITH MEALS 02/02/18   Carlyle Dolly, MD  metoprolol succinate (TOPROL-XL) 50 MG 24 hr tablet TAKE 1 TABLET EVERY DAY WITH OR IMMEDIATELY FOLLOWING A MEAL 02/03/18   Carlyle Dolly, MD  polyethylene glycol powder (GLYCOLAX/MIRALAX) powder Take 2 capsules daily until constipation is resolved 08/09/17   Caccavale, Sophia, PA-C  potassium chloride (K-DUR) 10 MEQ tablet Take 2 tablets (20 mEq total) by mouth 2 (two) times daily. 01/19/18 04/19/18  Sueanne Margarita, MD  rosuvastatin (CRESTOR) 20 MG tablet TAKE 1 TABLET BY MOUTH EVERY DAY 04/07/18   Sueanne Margarita, MD  traMADol (ULTRAM) 50 MG tablet Take 1 tablet (50 mg total) by mouth every 6 (six) hours as needed (for pain). 08/18/17   Carlyle Dolly, MD  XARELTO 20 MG TABS tablet TAKE 1 TABLET BY MOUTH EVERY DAY WITH SUPPER 04/07/18   Sueanne Margarita, MD    Family History Family History  Problem Relation Age of Onset  . Diabetes Mother   . Kidney disease Mother   . Heart disease Mother   . Lung cancer Father   . Heart disease Father   . Cancer Father   . Stomach cancer Sister   . Cancer Sister   .  Clotting disorder Other        neice  . Colon cancer Neg Hx     Social History Social History   Tobacco Use  . Smoking status: Former Smoker    Packs/day: 1.00    Years: 5.00    Pack years: 5.00    Types: Cigarettes    Last attempt to quit: 12/09/1978    Years since quitting: 39.4  . Smokeless tobacco: Never Used  Substance Use Topics  . Alcohol use: No  . Drug use: No     Allergies   Lisinopril; Flexeril [cyclobenzaprine hcl]; Lipitor [atorvastatin calcium]; and Metaxalone   Review of Systems Review of Systems  Constitutional: Negative for chills.  HENT: Negative for trouble swallowing.        See  HPI.  Respiratory: Positive for shortness of breath.   Gastrointestinal: Negative for nausea and vomiting.  Skin: Positive for rash.  Neurological: Negative for syncope and weakness.     Physical Exam Updated Vital Signs BP 132/77 (BP Location: Right Arm)   Pulse 81   Temp 98.7 F (37.1 C) (Oral)   Resp 18   Ht _0  (1.575 m)   Wt 109.8 kg (242 lb)   SpO2 98%   BMI 44.26 kg/m   Physical Exam  Constitutional: She is oriented to person, place, and time. She appears well-developed and well-nourished.  HENT:  Head: Normocephalic.  Mouth/Throat: Oropharynx is clear and moist.  No facial swelling.  Neck: Normal range of motion. Neck supple.  Cardiovascular: Normal rate and regular rhythm.  Pulmonary/Chest: Effort normal and breath sounds normal. She has no wheezes. She has no rales.  Abdominal: Soft. Bowel sounds are normal. There is no tenderness. There is no rebound and no guarding.  Musculoskeletal: Normal range of motion.  Neurological: She is alert and oriented to person, place, and time.  Skin: Skin is warm and dry. No rash noted.  Small areas of redness to upper extremities that are not raised, c/w residual hives.   Psychiatric: She has a normal mood and affect.     ED Treatments / Results  Labs (all labs ordered are listed, but only abnormal results  are displayed) Labs Reviewed - No data to display  EKG None  Radiology No results found.  Procedures Procedures (including critical care time)  Medications Ordered in ED Medications - No data to display   Initial Impression / Assessment and Plan / ED Course  I have reviewed the triage vital signs and the nursing notes.  Pertinent labs & imaging results that were available during my care of the patient were reviewed by me and considered in my medical decision making (see chart for details).     Patient here with intermittent rash c/w hives per description of patient and pattern of rash. Will start on benadryl and pepcid, continue topical steroids. Recommend stopping doxycyline. Encouraged recheck with her doctor. REturn precautions discussed given subjective SOB and throat tightening tonight. Feel she is stable for discharge home.  Final Clinical Impressions(s) / ED Diagnoses   Final diagnoses:  None   1. [place]  ED Discharge Orders    None       Charlann Lange, PA-C 04/29/18 Boyds, [place], DO 04/29/18 [phone]

## 2018-04-30 ENCOUNTER — Other Ambulatory Visit: Payer: Self-pay

## 2018-04-30 ENCOUNTER — Emergency Department (HOSPITAL_COMMUNITY): Payer: Medicare HMO

## 2018-04-30 ENCOUNTER — Emergency Department (HOSPITAL_COMMUNITY)
Admission: EM | Admit: 2018-04-30 | Discharge: 2018-04-30 | Disposition: A | Discharge status: Home / Self Care | Payer: Medicare HMO | Attending: Emergency Medicine | Admitting: Emergency Medicine

## 2018-04-30 ENCOUNTER — Encounter (HOSPITAL_COMMUNITY): Payer: Self-pay

## 2018-04-30 DIAGNOSIS — Z79899 Other long term (current) drug therapy: Secondary | ICD-10-CM | POA: Diagnosis not present

## 2018-04-30 DIAGNOSIS — Z7901 Long term (current) use of anticoagulants: Secondary | ICD-10-CM | POA: Diagnosis not present

## 2018-04-30 DIAGNOSIS — R112 Nausea with vomiting, unspecified: Secondary | ICD-10-CM | POA: Diagnosis not present

## 2018-04-30 DIAGNOSIS — Z87891 Personal history of nicotine dependence: Secondary | ICD-10-CM | POA: Diagnosis not present

## 2018-04-30 DIAGNOSIS — Z7984 Long term (current) use of oral hypoglycemic drugs: Secondary | ICD-10-CM | POA: Diagnosis not present

## 2018-04-30 DIAGNOSIS — R1013 Epigastric pain: Secondary | ICD-10-CM | POA: Insufficient documentation

## 2018-04-30 DIAGNOSIS — R197 Diarrhea, unspecified: Secondary | ICD-10-CM | POA: Diagnosis not present

## 2018-04-30 DIAGNOSIS — R1110 Vomiting, unspecified: Secondary | ICD-10-CM | POA: Diagnosis not present

## 2018-04-30 DIAGNOSIS — J45909 Unspecified asthma, uncomplicated: Secondary | ICD-10-CM | POA: Insufficient documentation

## 2018-04-30 DIAGNOSIS — E119 Type 2 diabetes mellitus without complications: Secondary | ICD-10-CM | POA: Insufficient documentation

## 2018-04-30 DIAGNOSIS — I2510 Atherosclerotic heart disease of native coronary artery without angina pectoris: Secondary | ICD-10-CM | POA: Insufficient documentation

## 2018-04-30 DIAGNOSIS — L509 Urticaria, unspecified: Secondary | ICD-10-CM | POA: Insufficient documentation

## 2018-04-30 DIAGNOSIS — I10 Essential (primary) hypertension: Secondary | ICD-10-CM | POA: Diagnosis not present

## 2018-04-30 DIAGNOSIS — R109 Unspecified abdominal pain: Secondary | ICD-10-CM | POA: Diagnosis not present

## 2018-04-30 LAB — COMPREHENSIVE METABOLIC PANEL
ALBUMIN: 3.7 g/dL (ref 3.5–5.0)
ALK PHOS: 46 U/L (ref 38–126)
ALT: 17 U/L (ref 14–54)
AST: 20 U/L (ref 15–41)
Anion gap: 10 (ref 5–15)
BUN: 17 mg/dL (ref 6–20)
CALCIUM: 9.5 mg/dL (ref 8.9–10.3)
CHLORIDE: 100 mmol/L — AB (ref 101–111)
CO2: 29 mmol/L (ref 22–32)
Creatinine, Ser: 1 mg/dL (ref 0.44–1.00)
GFR calc non Af Amer: 57 mL/min — ABNORMAL LOW (ref >60)
GLUCOSE: 244 mg/dL — AB (ref 65–99)
Potassium: 3.2 mmol/L — ABNORMAL LOW (ref 3.5–5.1)
SODIUM: 139 mmol/L (ref 135–145)
Total Bilirubin: 0.7 mg/dL (ref 0.3–1.2)
Total Protein: 7 g/dL (ref 6.5–8.1)

## 2018-04-30 LAB — CBC WITH DIFFERENTIAL/PLATELET
ABS IMMATURE GRANULOCYTES: 0 10*3/uL (ref 0.0–0.1)
BASOS ABS: 0 10*3/uL (ref 0.0–0.1)
BASOS PCT: 0 %
Eosinophils Absolute: 0 10*3/uL (ref 0.0–0.7)
Eosinophils Relative: 1 %
HCT: 39.6 % (ref 36.0–46.0)
HEMOGLOBIN: 13.4 g/dL (ref 12.0–15.0)
IMMATURE GRANULOCYTES: 0 %
LYMPHS PCT: 20 %
Lymphs Abs: 1.4 10*3/uL (ref 0.7–4.0)
MCH: 25.5 pg — AB (ref 26.0–34.0)
MCHC: 33.8 g/dL (ref 30.0–36.0)
MCV: 75.4 fL — ABNORMAL LOW (ref 78.0–100.0)
MONO ABS: 0.3 10*3/uL (ref 0.1–1.0)
Monocytes Relative: 4 %
NEUTROS ABS: 5.5 10*3/uL (ref 1.7–7.7)
NEUTROS PCT: 75 %
PLATELETS: 173 10*3/uL (ref 150–400)
RBC: 5.25 MIL/uL — AB (ref 3.87–5.11)
RDW: 14.3 % (ref 11.5–15.5)
WBC: 7.3 10*3/uL (ref 4.0–10.5)

## 2018-04-30 LAB — URINALYSIS, ROUTINE W REFLEX MICROSCOPIC
BILIRUBIN URINE: NEGATIVE
GLUCOSE, UA: NEGATIVE mg/dL
Hgb urine dipstick: NEGATIVE
KETONES UR: NEGATIVE mg/dL
Leukocytes, UA: NEGATIVE
NITRITE: NEGATIVE
PH: 5 (ref 5.0–8.0)
Protein, ur: NEGATIVE mg/dL
SPECIFIC GRAVITY, URINE: 1.019 (ref 1.005–1.030)

## 2018-04-30 MED ORDER — HYDROXYZINE HCL 25 MG PO TABS: 25.0000 mg | ORAL_TABLET | Freq: Three times a day (TID) | ORAL | 0 refills | Status: DC | PRN

## 2018-04-30 MED ORDER — SODIUM CHLORIDE 0.9 % IV BOLUS
500.0000 mL | Freq: Once | INTRAVENOUS | Status: AC
Start: 2018-04-30 — End: 2018-04-30
  Administered 2018-04-30: 500 mL via INTRAVENOUS

## 2018-04-30 MED ORDER — ONDANSETRON HCL 4 MG/2ML IJ SOLN
4.0000 mg | Freq: Once | INTRAMUSCULAR | Status: AC
  Administered 2018-04-30: 4 mg via INTRAVENOUS
  Filled 2018-04-30: qty 2

## 2018-04-30 MED ORDER — IOHEXOL 300 MG/ML  SOLN
100.0000 mL | Freq: Once | INTRAMUSCULAR | Status: AC
  Administered 2018-04-30: 100 mL via INTRAVENOUS

## 2018-04-30 MED ORDER — FAMOTIDINE IN NACL 20-0.9 MG/50ML-% IV SOLN
20.0000 mg | Freq: Once | INTRAVENOUS | Status: AC
  Administered 2018-04-30: 20 mg via INTRAVENOUS
  Filled 2018-04-30: qty 50

## 2018-04-30 MED ORDER — IOHEXOL 300 MG/ML  SOLN: 100.0000 mL | Freq: Once | INTRAMUSCULAR | Status: DC | PRN

## 2018-04-30 MED ORDER — OMEPRAZOLE 20 MG PO CPDR: 20.0000 mg | DELAYED_RELEASE_CAPSULE | Freq: Every day | ORAL | 0 refills | Status: DC

## 2018-04-30 NOTE — ED Triage Notes (Signed)
Pt report abdominal pain, n/v, and 3 episodes of diarrhea since this AM.

## 2018-04-30 NOTE — ED Notes (Signed)
Patient transported to CT 

## 2018-04-30 NOTE — ED Provider Notes (Signed)
[facility] EMERGENCY DEPARTMENT Provider Note   CSN: [id number] Arrival date & time: 04/30/18  1441     History   Chief Complaint Chief Complaint  Patient presents with  . Abdominal Pain  . Emesis    HPI Meredith [name] is a 67 y.o. female.  HPI Patient presents with concern of nausea, abdominal pain, vomiting. Onset was about 2 hours prior to ED arrival, patient notes that she was generally well prior to this, though she has been dealing with recurrent urticaria. No clear precipitant, and onset was sudden, with pain in the upper abdomen, sharp, severe. Pain is improved somewhat, without clear intervention. No loose stool, no fever, no confusion, no chest pain, no dyspnea. Patient has a history of multiple abdominal surgery including tumor removal in the distant past and tubal ligation.  Past Medical History:  Diagnosis Date  . ANEMIA, IRON DEFICIENCY, UNSPEC. 02/05/2007   Qualifier: Diagnosis of  By: Damita Dunnings MD, Phillip Heal    . CAD (coronary artery disease), native coronary artery 02/16/2015   Cath with 20% LCx and RCA  . Colon polyps   . Diabetes mellitus   . Diverticulosis 05/17/12  . DJD (degenerative joint disease) of lumbar spine   . Enteritis   . Gastric ulcer 04/2000  . Hyperlipidemia   . Hypertension   . Irregular heart beat   . Obesity   . Osteoarthritis   . Persistent atrial fibrillation (HCC)    CHADS2VASC score is 5 and on Xarelto  . Renal cyst, left 05/17/12  . Ventral hernia     Patient Active Problem List   Diagnosis Date Noted  . Pelvic pain 11/14/2017  . Osteopenia 09/15/2017  . Pain of both hip joints 06/13/2017  . Knee pain, chronic 07/21/2016  . CTS (carpal tunnel syndrome) 06/06/2016  . Numbness of hand 04/06/2016  . Estrogen deficiency 03/29/2016  . CAD (coronary artery disease), native coronary artery 10/26/2015  . Back pain 08/17/2015  . Persistent atrial fibrillation ([place]) 02/23/2015  . PAC (premature atrial contraction)  01/19/2015  . Atrial tachycardia, paroxysmal ([place]) 01/19/2015  . Irregular heart rhythm 11/25/2014  . Healthcare maintenance 03/04/2014  . ASTHMA, INTERMITTENT 09/07/2010  . HERNIA, VENTRAL 04/18/2010  . 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 Diabetes mellitus type II, controlled, with no complications ([place]) 64/40/3474  . HYPERCHOLESTEROLEMIA 02/05/2007  . Obesity 02/05/2007  . DEPRESSION, MAJOR, RECURRENT 02/05/2007  . HYPERTENSION, BENIGN SYSTEMIC 02/05/2007  . GASTROESOPHAGEAL REFLUX, NO ESOPHAGITIS 02/05/2007  . OSTEOARTHRITIS, MULTI SITES 02/05/2007    Past Surgical History:  Procedure Laterality Date  . ABDOMINAL HYSTERECTOMY    . LEFT HEART CATHETERIZATION WITH CORONARY ANGIOGRAM N/A 02/16/2015   Procedure: LEFT HEART CATHETERIZATION WITH CORONARY ANGIOGRAM;  Surgeon: Burnell Blanks, MD;  Location[facility] CATH LAB;  Service: Cardiovascular;  Laterality: N/A;  . OPERATIVE HYSTEROSCOPY    . TUBAL LIGATION    . UTERINE FIBROID SURGERY       OB History   None      Home Medications    Prior to Admission medications   Medication Sig Start Date End Date Taking? Authorizing Provider  amLODipine (NORVASC) 10 MG tablet TAKE 1 TABLET BY MOUTH EVERY DAY 11/20/17  Yes Turner, Eber Hong, MD  diphenhydrAMINE (BENADRYL) 25 MG tablet Take one tablet every 8 hours for the next 3 days, then as needed if rash recurs. 04/29/18  Yes Upstill, Nehemiah Settle, PA-C  ezetimibe (ZETIA) 10 MG tablet TAKE 1 TABLET BY MOUTH EVERY DAY  04/20/18  Yes Carlyle Dolly, MD  famotidine (PEPCID) 20 MG tablet Take one tablet twice daily for the next 3 days then as needed for recurrent rash. 04/29/18  Yes Upstill, Nehemiah Settle, PA-C  hydrochlorothiazide (HYDRODIURIL) 25 MG tablet TAKE 1 TABLET BY MOUTH DAILY 04/29/18  Yes Turner, Traci R, MD  KLOR-CON M20 20 MEQ tablet TAKE 1 TABLET BY MOUTH EVERY DAY 12/16/17  Yes Turner, Traci R, MD  metFORMIN (GLUCOPHAGE) 1000 MG tablet TAKE 1 TABLET BY MOUTH TWICE A DAY  WITH MEALS 02/02/18  Yes Carlyle Dolly, MD  metoprolol succinate (TOPROL-XL) 50 MG 24 hr tablet TAKE 1 TABLET EVERY DAY WITH OR IMMEDIATELY FOLLOWING A MEAL 02/03/18  Yes Carlyle Dolly, MD  polyethylene glycol powder (GLYCOLAX/MIRALAX) powder Take 2 capsules daily until constipation is resolved 08/09/17  Yes Caccavale, Sophia, PA-C  rosuvastatin (CRESTOR) 20 MG tablet TAKE 1 TABLET BY MOUTH EVERY DAY 04/07/18  Yes Turner, Eber Hong, MD  traMADol (ULTRAM) 50 MG tablet Take 1 tablet (50 mg total) by mouth every 6 (six) hours as needed (for pain). 08/18/17  Yes Gambino, Arlie Solomons, MD  XARELTO 20 MG TABS tablet TAKE 1 TABLET BY MOUTH EVERY DAY WITH SUPPER 04/07/18  Yes Turner, Eber Hong, MD  ACCU-CHEK SOFTCLIX LANCETS lancets Use as instructed to test blood glucose once daily. ICD-10 code: E11.9 09/17/17   McDiarmid, Blane Ohara, MD  acetaminophen (ACETAMINOPHEN 8 HOUR) 650 MG CR tablet Take 1 tablet (650 mg total) by mouth every 8 (eight) hours. Patient not taking: Reported on 04/30/2018 08/09/17   Caccavale, Sophia, PA-C  baclofen (LIORESAL) 10 MG tablet Take 1 tablet (10 mg total) by mouth 3 (three) times daily. Patient not taking: Reported on 04/30/2018 03/06/18   Nicolette Bang, DO  Blood Glucose Monitoring Suppl (ACCU-CHEK AVIVA PLUS) w/Device KIT 1 kit by Does not apply route as directed. ICD-10 code: E11.9 09/17/17   McDiarmid, Blane Ohara, MD  glucose blood (ACCU-CHEK AVIVA PLUS) test strip Use as instructed test blood glucose once daily. ICD-10 code: E11.9 09/17/17   McDiarmid, Blane Ohara, MD  JARDIANCE 10 MG TABS tablet TAKE 1 TABLET BY MOUTH DAILY Patient not taking: Reported on 04/30/2018 03/02/18   Carlyle Dolly, MD  KLOR-CON 10 10 MEQ tablet TAKE 2 TABLETS (20 MEQ TOTAL) BY MOUTH 2 (TWO) TIMES DAILY. Patient not taking: Reported on 04/30/2018 04/07/18   Sueanne Margarita, MD  Lancets Misc. (ACCU-CHEK SOFTCLIX LANCET DEV) KIT Use as directed to test once daily. ICD-10 code: E11.9 09/17/17    McDiarmid, Blane Ohara, MD    Family History Family History  Problem Relation Age of Onset  . Diabetes Mother   . Kidney disease Mother   . Heart disease Mother   . Lung cancer Father   . Heart disease Father   . Cancer Father   . Stomach cancer Sister   . Cancer Sister   . Clotting disorder Other        neice  . Colon cancer Neg Hx     Social History Social History   Tobacco Use  . Smoking status: Former Smoker    Packs/day: 1.00    Years: 5.00    Pack years: 5.00    Types: Cigarettes    Last attempt to quit: 12/09/1978    Years since quitting: 39.4  . Smokeless tobacco: Never Used  Substance Use Topics  . Alcohol use: No  . Drug use: No     Allergies   Lisinopril;  Flexeril [cyclobenzaprine hcl]; Lipitor [atorvastatin calcium]; and Metaxalone   Review of Systems Review of Systems  Constitutional:       Per HPI, otherwise negative  HENT:       Per HPI, otherwise negative  Respiratory:       Per HPI, otherwise negative  Cardiovascular:       Per HPI, otherwise negative  Gastrointestinal: Positive for abdominal pain, nausea and vomiting.  Endocrine:       Negative aside from HPI  Genitourinary:       Neg aside from HPI   Musculoskeletal:       Per HPI, otherwise negative  Skin: Negative.   Neurological: Negative for syncope.     Physical Exam Updated Vital Signs BP (!) 103/57   Pulse 69   Temp 98.9 F (37.2 C) (Oral)   Resp 18   SpO2 96%   Physical Exam  Constitutional: She is oriented to person, place, and time. She appears well-developed and well-nourished. No distress.  HENT:  Head: Normocephalic and atraumatic.  Eyes: Conjunctivae and EOM are normal.  Cardiovascular: Normal rate and regular rhythm.  Pulmonary/Chest: Effort normal and breath sounds normal. No stridor. No respiratory distress.  Abdominal: She exhibits no distension. There is generalized tenderness and tenderness in the epigastric area. There is guarding.  Musculoskeletal: She  exhibits no edema.  Neurological: She is alert and oriented to person, place, and time. No cranial nerve deficit.  Skin: Skin is warm and dry. No rash noted.  Psychiatric: She has a normal mood and affect.  Nursing note and vitals reviewed.    ED Treatments / Results  Labs (all labs ordered are listed, but only abnormal results are displayed) Labs Reviewed  CBC WITH DIFFERENTIAL/PLATELET - Abnormal; Notable for the following components:      Result Value   RBC 5.25 (*)    MCV 75.4 (*)    MCH 25.5 (*)    All other components within normal limits  COMPREHENSIVE METABOLIC PANEL - Abnormal; Notable for the following components:   Potassium 3.2 (*)    Chloride 100 (*)    Glucose, Bld 244 (*)    GFR calc non Af Amer 57 (*)    All other components within normal limits  URINALYSIS, ROUTINE W REFLEX MICROSCOPIC - Abnormal; Notable for the following components:   Color, Urine STRAW (*)    All other components within normal limits    EKG None  Radiology Ct Abdomen Pelvis W Contrast  Result Date: 04/30/2018 CLINICAL DATA:  67 year old with acute abdominal pain. Three episodes of diarrhea. EXAM: CT ABDOMEN AND PELVIS WITH CONTRAST TECHNIQUE: Multidetector CT imaging of the abdomen and pelvis was performed using the standard protocol following bolus administration of intravenous contrast. CONTRAST:  18m OMNIPAQUE IOHEXOL 300 MG/ML  SOLN COMPARISON:  CT from 01/05/2015 FINDINGS: Lower chest: Dependent atelectasis in the lower lobes. Mild hazy densities in the left lower lobe are nonspecific but could also represent volume loss. No large pleural effusions. Hepatobiliary: Normal appearance of the liver, gallbladder and portal venous system. Pancreas: Unremarkable. No pancreatic ductal dilatation or surrounding inflammatory changes. Spleen: Normal in size without focal abnormality. Adrenals/Urinary Tract: Normal adrenal glands. Small low-density structures in left kidney lower pole are most  compatible with renal cysts. Normal appearance of the urinary bladder. No suspicious renal lesions. No hydronephrosis. Stomach/Bowel: Normal appendix. Scattered colonic diverticulosis without acute colonic inflammation. No evidence for bowel dilatation or obstruction. Stomach is unremarkable. Vascular/Lymphatic: Mild atherosclerotic disease in the  abdominal aorta without aneurysm. Main visceral arteries appear to be patent. Few small retroperitoneal lymph nodes. Overall, no significant lymph node enlargement in the abdomen or pelvis. Reproductive: Status post hysterectomy. No adnexal masses. Other: There is trace free fluid in the lower abdomen and pelvis. Multiple small ventral hernias containing fat. No free air. Musculoskeletal: Increased sclerosis at the pubic symphysis is probably related to degenerative disease. Minimal anterolisthesis at L5-S1 secondary to facet arthropathy. Severe disc space narrowing at L4-L5. IMPRESSION: No acute abnormality identified within the abdomen and pelvis. Trace fluid in the lower abdomen and pelvis could represent an occult inflammatory process but the source is unknown. Colonic diverticulosis without inflammation. Left renal cysts. Multilevel degenerative disease in the lumbar spine. Evidence for degenerative disease at the pubic symphysis. Electronically Signed   By: Markus Daft M.D.   On: 04/30/2018 16:56    Procedures Procedures (including critical care time)  Medications Ordered in ED Medications  iohexol (OMNIPAQUE) 300 MG/ML solution 100 mL (has no administration in time range)  sodium chloride 0.9 % bolus 500 mL (500 mLs Intravenous New Bag/Given 04/30/18 1610)  ondansetron (ZOFRAN) injection 4 mg (4 mg Intravenous Given 04/30/18 1611)  famotidine (PEPCID) IVPB 20 mg premix (0 mg Intravenous Stopped 04/30/18 1641)  iohexol (OMNIPAQUE) 300 MG/ML solution 100 mL (100 mLs Intravenous Contrast Given 04/30/18 1628)     Initial Impression / Assessment and Plan / ED  Course  I have reviewed the triage vital signs and the nursing notes.  Pertinent labs & imaging results that were available during my care of the patient were reviewed by me and considered in my medical decision making (see chart for details).   Review notable for recent evaluation here for urticaria.  7:09 PM On repeat exam the patient is awake alert, in no distress, no ongoing vomiting, nor complaints. She remains afebrile, hemodynamically unremarkable. We discussed the CT findings, as well as lab results, no evidence for acute new intra-abdominal process, some suspicion for gastric etiology given her focal pain, nausea, vomiting, absent diarrhea. Patient be started on Pepcid.  Patient has previously scheduled appointment tomorrow, which is preferred for follow-up.   Final Clinical Impressions(s) / ED Diagnoses  Epigastric pain Nausea and vomiting   Carmin Muskrat, MD 04/30/18 1910

## 2018-04-30 NOTE — Discharge Instructions (Signed)
As discussed, your evaluation today has been largely reassuring.  But, it is important that you monitor your condition carefully, and do not hesitate to return to the ED if you develop new, or concerning changes in your condition. ? ?Otherwise, please follow-up with your physician for appropriate ongoing care. ? ?

## 2018-04-30 NOTE — ED Provider Notes (Signed)
Patient placed in Quick Look pathway, seen and evaluated   Chief Complaint: abdominal pain and diarrhea  HPI:   Pt reports diarrhea and abdominal pain started this am   ROS: diarrhea.   Physical Exam:   Gen: No distress  Neuro: Awake and Alert  Skin: Warm    Focused Exam: diffusely tender abdomen    Initiation of care has begun. The patient has been counseled on the process, plan, and necessity for staying for the completion/evaluation, and the remainder of the medical screening examination   Meredith [name] 04/30/18 1500    Loren Racer, MD 05/01/18 1640

## 2018-05-01 ENCOUNTER — Ambulatory Visit (INDEPENDENT_AMBULATORY_CARE_PROVIDER_SITE_OTHER): Payer: Medicare HMO | Admitting: Family Medicine

## 2018-05-01 ENCOUNTER — Other Ambulatory Visit: Payer: Self-pay

## 2018-05-01 ENCOUNTER — Encounter: Payer: Self-pay | Admitting: Family Medicine

## 2018-05-01 VITALS — BP 116/76 | HR 80 | Temp 98.1°F | Ht 63.0 in | Wt 240.0 lb

## 2018-05-01 DIAGNOSIS — R109 Unspecified abdominal pain: Secondary | ICD-10-CM

## 2018-05-01 DIAGNOSIS — R21 Rash and other nonspecific skin eruption: Secondary | ICD-10-CM | POA: Diagnosis not present

## 2018-05-01 NOTE — Progress Notes (Signed)
Date of Visit: 05/01/2018   HPI:  Patient presents for a same day appointment to discuss ED follow up.  5/12 - was seen originally at urgent care for insect bite on left lower abdomen, with surrounding rash that was itchy. Thought it may be a tick. Given rx for doxycycline and kenalog cream. Took doxycycline for only 2-3 days. Developed lip swelling about 1 week later.  5/22 - seen at ED for continued rash, mild shortness of breath, and throat tightening. Symptoms had already improved without intervention at time of ED visit. Rash thought to be due to hives, given rx for benadryl and pepcid, continue topical steroids. Was told to stop doxycycline out of concern for it causing allergic symptoms.  5/23 - seen again at ED for n/v and abdominal pain. Had labs and CT scan done which was overall unremarkable. Started patient on prilosec and hydroxyzine. Sent home.  Since visit on 5/23 (yesterday) patient has been felling well. Feels all the way better. Has no other complaints, just came for visit here because she was told to follow up with our office.  ROS: See HPI  PMFSH: history of PAF, hypertension, OA, hyperlipidemia, asthma, type 2 diabetes, osteopenia, CAD, depression, GERD  PHYSICAL EXAM: BP 116/76 (BP Location: Left Wrist, Patient Position: Sitting, Cuff Size: Normal)   Pulse 80   Temp 98.1 F (36.7 C) (Oral)   Ht  (1.6 m)   Wt 240 lb (108.9 kg)   SpO2 97%   BMI 42.51 kg/m  Gen: no acute distress, pleasant, cooperative HEENT: normocephalic, atraumatic, moist mucous membranes, no oral swelling seen Heart: regular rate and rhythm Lungs: clear to auscultation bilaterally, normal work of breathing  Abdomen: soft, nontender to palpation  Neuro: alert, grossly nonfocal, speech normal Skin: no rashes seen  ASSESSMENT/PLAN:  67 yo F presents for ED follow up after rash and abdominal pain. Also possible allergic rxn to doxycycline. Will add doxycycline to allergies. Patient much  improved and is without complaints today. Advised continuing current medications and follow up with PCP for diabetes and other chronic medical problems, as she is past due for follow up on these. Patient appreciative & agreeable to this plan.  FOLLOW UP: Follow up when able with PCP for chronic medical issues.  [place] J. Pollie Meyer, MD [facility]

## 2018-05-01 NOTE — Patient Instructions (Signed)
I'm glad you are feeling better. I added doxycycline as an allergy, just to be on the safe side. Stay on medications you're on.  Schedule an appointment with Dr. Jonathon Jordan for your diabetes and other medical issues.  Be well, Dr. Pollie Meyer

## 2018-05-03 ENCOUNTER — Other Ambulatory Visit: Payer: Self-pay | Admitting: Family Medicine

## 2018-05-12 ENCOUNTER — Other Ambulatory Visit: Payer: Self-pay | Admitting: Cardiology

## 2018-05-22 ENCOUNTER — Ambulatory Visit (INDEPENDENT_AMBULATORY_CARE_PROVIDER_SITE_OTHER): Payer: Medicare HMO | Admitting: Family Medicine

## 2018-05-22 ENCOUNTER — Encounter: Payer: Self-pay | Admitting: Family Medicine

## 2018-05-22 ENCOUNTER — Other Ambulatory Visit: Payer: Self-pay

## 2018-05-22 VITALS — BP 128/82 | HR 63 | Temp 98.4°F | Ht 63.0 in | Wt 243.8 lb

## 2018-05-22 DIAGNOSIS — I10 Essential (primary) hypertension: Secondary | ICD-10-CM

## 2018-05-22 DIAGNOSIS — M25561 Pain in right knee: Secondary | ICD-10-CM

## 2018-05-22 DIAGNOSIS — M25562 Pain in left knee: Secondary | ICD-10-CM | POA: Diagnosis not present

## 2018-05-22 DIAGNOSIS — Z0000 Encounter for general adult medical examination without abnormal findings: Secondary | ICD-10-CM | POA: Diagnosis not present

## 2018-05-22 DIAGNOSIS — G8929 Other chronic pain: Secondary | ICD-10-CM

## 2018-05-22 DIAGNOSIS — E119 Type 2 diabetes mellitus without complications: Secondary | ICD-10-CM

## 2018-05-22 LAB — POCT GLYCOSYLATED HEMOGLOBIN (HGB A1C): HbA1c, POC (controlled diabetic range): 8.5 % — AB (ref 0.0–7.0)

## 2018-05-22 MED ORDER — TETANUS-DIPHTH-ACELL PERTUSSIS 5-2.5-18.5 LF-MCG/0.5 IM SUSP: 0.5000 mL | Freq: Once | INTRAMUSCULAR | 0 refills | Status: AC

## 2018-05-22 MED ORDER — TRAMADOL HCL 50 MG PO TABS: 50.0000 mg | ORAL_TABLET | Freq: Four times a day (QID) | ORAL | 0 refills | Status: DC | PRN

## 2018-05-22 NOTE — Assessment & Plan Note (Signed)
Controlled and at goal. Continue current medication regimen.  

## 2018-05-22 NOTE — Progress Notes (Signed)
Subjective:  Meredith [name] is a 67 y.o. year old female PMH of a fib on xarelto, T2DM, osteoarthritis, CAD, HLD, obesity who presents to office today for an annual physical examination.  Concerns today include:  1. Knee pain: Notes that her knee pain is daily. She notes she is "ok". She usually walks every day. She will take Tramadol PRN for pain, this happens about once a week. She also takes Tylenol PRN as well. She notes that she was on some kind of NSAID but was taken off of this because it raised her blood pressure. She notes that she does walk with a cane. She was told 10 years ago she needs her knees replaced because she said "THE ANSWER IS NO"   Review of Systems  Constitutional: Negative for fever and weight loss.  HENT: Negative for ear pain, hearing loss and sinus pain.   Eyes: Negative for blurred vision.  Respiratory: Negative for cough, shortness of breath and wheezing.   Cardiovascular: Negative for chest pain and leg swelling.  Gastrointestinal: Negative for abdominal pain, blood in stool, constipation, diarrhea, heartburn, melena, nausea and vomiting.  Genitourinary: Negative for dysuria and frequency.  Musculoskeletal: Positive for knee pain  Skin: Negative for rash.  Neurological: Negative for dizziness, tingling, focal weakness and headaches.  Psychiatric/Behavioral: Negative for depression and suicidal ideas.    General Healthcare: Medication Compliance: Compliant with most, does not take Jardiance Dx Hypertension: yes Dx Hyperlipidemia: yes Diabetes: yes Dx Obesity: yes Weight Loss: no Physical Activity: minimal and limited secondary to her knee pain Urinary Incontinence: No  Menstrual hx: Postmenopausal Last dental exam: In the last year, wears dentures  Social:  reports that she quit smoking about 39 years ago. Her smoking use included cigarettes. She has a 5.00 pack-year smoking history. She has never used smokeless tobacco. Driving: Drives herself,  wears a seatbelt Alcohol Use:  No Tobacco: No  Other Drugs:  None  Support and Life at Home:  Good support at home Advanced Directives: No Work: Works at a [employer] at [employer] Maintenance Due  Topic Date Due  . PNA vac Low Risk Adult (1 of 2 - PCV13) 03/04/2015  . OPHTHALMOLOGY EXAM  09/05/2017  . TETANUS/TDAP  12/10/2017  . HEMOGLOBIN A1C  05/14/2018    Past Medical History Past Medical History:  Diagnosis Date  . ANEMIA, IRON DEFICIENCY, UNSPEC. 02/05/2007   Qualifier: Diagnosis of  By: Damita Dunnings MD, Phillip Heal    . CAD (coronary artery disease), native coronary artery 02/16/2015   Cath with 20% LCx and RCA  . Colon polyps   . Diabetes mellitus   . Diverticulosis 05/17/12  . DJD (degenerative joint disease) of lumbar spine   . Enteritis   . Gastric ulcer 04/2000  . Hyperlipidemia   . Hypertension   . Irregular heart beat   . Obesity   . Osteoarthritis   . Persistent atrial fibrillation (HCC)    CHADS2VASC score is 5 and on Xarelto  . Renal cyst, left 05/17/12  . Ventral hernia    Patient Active Problem List   Diagnosis Date Noted  . Osteopenia 09/15/2017  . Pain of both hip joints 06/13/2017  . Knee pain, chronic 07/21/2016  . CTS (carpal tunnel syndrome) 06/06/2016  . Estrogen deficiency 03/29/2016  . CAD (coronary artery disease), native coronary artery 10/26/2015  . Back pain 08/17/2015  . Persistent atrial fibrillation ([place]) 02/23/2015  . PAC (premature atrial contraction) 01/19/2015  . Atrial tachycardia, paroxysmal ([place]) 01/19/2015  .  Irregular heart rhythm 11/25/2014  . Healthcare maintenance 03/04/2014  . ASTHMA, INTERMITTENT 09/07/2010  . HERNIA, VENTRAL 04/18/2010  . 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 Diabetes mellitus type II, controlled, with no complications ([place]) 19/50/9326  . HYPERCHOLESTEROLEMIA 02/05/2007  . Obesity 02/05/2007  . DEPRESSION, MAJOR, RECURRENT 02/05/2007  . HYPERTENSION, BENIGN SYSTEMIC 02/05/2007  .  GASTROESOPHAGEAL REFLUX, NO ESOPHAGITIS 02/05/2007  . OSTEOARTHRITIS, MULTI SITES 02/05/2007    Medications- reviewed and updated Current Outpatient Medications  Medication Sig Dispense Refill  . ACCU-CHEK SOFTCLIX LANCETS lancets Use as instructed to test blood glucose once daily. ICD-10 code: E11.9 100 each 12  . acetaminophen (ACETAMINOPHEN 8 HOUR) 650 MG CR tablet Take 1 tablet (650 mg total) by mouth every 8 (eight) hours. 30 tablet 0  . amLODipine (NORVASC) 10 MG tablet TAKE 1 TABLET BY MOUTH EVERY DAY 90 tablet 1  . Blood Glucose Monitoring Suppl (ACCU-CHEK AVIVA PLUS) w/Device KIT 1 kit by Does not apply route as directed. ICD-10 code: E11.9 1 kit 0  . ezetimibe (ZETIA) 10 MG tablet TAKE 1 TABLET BY MOUTH EVERY DAY 30 tablet 2  . glucose blood (ACCU-CHEK AVIVA PLUS) test strip Use as instructed test blood glucose once daily. ICD-10 code: E11.9 100 each 12  . hydrochlorothiazide (HYDRODIURIL) 25 MG tablet TAKE 1 TABLET BY MOUTH DAILY 90 tablet 1  . hydrOXYzine (ATARAX/VISTARIL) 25 MG tablet Take 1 tablet (25 mg total) by mouth every 8 (eight) hours as needed for itching. 12 tablet 0  . KLOR-CON M20 20 MEQ tablet TAKE 1 TABLET BY MOUTH EVERY DAY 30 tablet 5  . Lancets Misc. (ACCU-CHEK SOFTCLIX LANCET DEV) KIT Use as directed to test once daily. ICD-10 code: E11.9 1 kit 0  . metFORMIN (GLUCOPHAGE) 1000 MG tablet TAKE 1 TABLET BY MOUTH TWICE A DAY WITH MEALS 180 tablet 1  . metoprolol succinate (TOPROL-XL) 50 MG 24 hr tablet TAKE 1 TABLET EVERY DAY WITH OR IMMEDIATELY FOLLOWING A MEAL 90 tablet 0  . omeprazole (PRILOSEC) 20 MG capsule Take 1 capsule (20 mg total) by mouth daily. Take one tablet daily 21 capsule 0  . polyethylene glycol powder (GLYCOLAX/MIRALAX) powder Take 2 capsules daily until constipation is resolved 255 g 0  . rosuvastatin (CRESTOR) 20 MG tablet TAKE 1 TABLET BY MOUTH EVERY DAY 90 tablet 2  . traMADol (ULTRAM) 50 MG tablet Take 1 tablet (50 mg total) by mouth every 6  (six) hours as needed (for pain). 30 tablet 0  . XARELTO 20 MG TABS tablet TAKE 1 TABLET BY MOUTH EVERY DAY WITH SUPPER 30 tablet 10  . JARDIANCE 10 MG TABS tablet TAKE 1 TABLET BY MOUTH DAILY (Patient not taking: Reported on 04/30/2018) 90 tablet 0  . Tdap (BOOSTRIX) 5-2.5-18.5 LF-MCG/0.5 injection Inject 0.5 mLs into the muscle once for 1 dose. 0.5 mL 0   No current facility-administered medications for this visit.     Objective: BP 128/82   Pulse 63   Temp 98.4 F (36.9 C) (Oral)   Ht _0  (1.6 m)   Wt 243 lb 12.8 oz (110.6 kg)   SpO2 99%   BMI 43.19 kg/m  Gen: In no acute distress, alert, cooperative with exam, well groomed, increased BMI HEENT: NCAT, EOMI, PERRL CV: Regular rate and rhythm, normal S1/S2 Resp: Clear to auscultation bilaterally, no wheezes, non-labored Abd: Soft, Non Tender, Non Distended, bowel sounds present, no guarding or organomegaly Ext: No edema, warm and well perfused Neuro: Alert and oriented, No gross  deficits, normal gait Psych: Normal mood and affect   Assessment/Plan:  Annual physical exam; patient doing well overall.  Only complaint today is knee pain.  Health maintenance items addressed below -Follow-up in 1 year for next annual physical  HYPERTENSION, BENIGN SYSTEMIC Controlled and at goal - Continue current medication regimen  Diabetes mellitus type II, controlled, with no complications (HCC) Uncontrolled.  A1c checked today, it is the same as before, 8.5.  Asked patient if we could discuss this further and she politely declined noting that she did not want to add any more medications to her regimen and she would just try diet modification -Continue limiting sugars and carbohydrates from diet -Continue metformin 1000 mg twice daily - Although Jardiance is on her medication list she has not been taking this because of the "side effects I saw on TV" - Follow-up in 3 months for next A1c check  Healthcare maintenance Patient declines PCV  13 pneumonia vaccine but stated she would take the Tdap.  Prescription given for this.  She will get this done at the pharmacy.  She is due for her eye exam, she will call her eye doctor to schedule this.  Hemoglobin A1c performed today.  Otherwise she is up-to-date on her health maintenance  Knee pain, chronic Uncontrolled pain from osteoarthritis of both knees.  Patient has been seen by orthopedics in the past but states that she definitely does not want a knee replacement.  She would prefer to manage her pain with medications at this time. -Refill tramadol -Avoid NSAIDs due to CAD -Can take Tylenol along with tramadol - Declines orthopedic referral at this time -Declined physical therapy    Orders Placed This Encounter  Procedures  . HgB A1c    Meds ordered this encounter  Medications  . traMADol (ULTRAM) 50 MG tablet    Sig: Take 1 tablet (50 mg total) by mouth every 6 (six) hours as needed (for pain).    Dispense:  30 tablet    Refill:  0  . Tdap (BOOSTRIX) 5-2.5-18.5 LF-MCG/0.5 injection    Sig: Inject 0.5 mLs into the muscle once for 1 dose.    Dispense:  0.5 mL    Refill:  0     Smitty Cords, MD [place], PGY-3

## 2018-05-22 NOTE — Assessment & Plan Note (Addendum)
Uncontrolled.  A1c checked today, it is the same as before, 8.5.  Asked patient if we could discuss this further and she politely declined noting that she did not want to add any more medications to her regimen and she would just try diet modification -Continue limiting sugars and carbohydrates from diet -Continue metformin 1000 mg twice daily - Although Jardiance is on her medication list she has not been taking this because of the "side effects I saw on TV" - Follow-up in 3 months for next A1c check

## 2018-05-22 NOTE — Assessment & Plan Note (Signed)
Uncontrolled pain from osteoarthritis of both knees.  Patient has been seen by orthopedics in the past but states that she definitely does not want a knee replacement.  She would prefer to manage her pain with medications at this time. -Refill tramadol -Avoid NSAIDs due to CAD -Can take Tylenol along with tramadol - Declines orthopedic referral at this time -Declined physical therapy

## 2018-05-22 NOTE — Patient Instructions (Signed)
Thank you for coming in today, it was so nice to see you! Today we talked about:    Eyes exam, make an appointment: Dr. Christia ReadingMark Thomas Shapiro. [address], [place], [place] [address]. [phone]  We also gave you a refill for Tramadol  We gave you a prescription for the tetanus vaccine, you can take this to your pharmacy   Please follow up in 3 months for diabetes and an A1C check. You can schedule this appointment at the front desk before you leave or call the clinic.  Bring in all your medications or supplements to each appointment for review.   If we ordered any tests today, you will be notified via telephone of any abnormalities. If everything is normal you will get a letter in the mail.   If you have any questions or concerns, please do not hesitate to call the office at [phone]. You can also message me directly via MyChart.   Sincerely,  Anders Simmondshristina Gambino, MD

## 2018-05-22 NOTE — Assessment & Plan Note (Addendum)
Patient declines PCV 13 pneumonia vaccine but stated she would take the Tdap.  Prescription given for this.  She will get this done at the pharmacy.  She is due for her eye exam, she will call her eye doctor to schedule this.  Hemoglobin A1c performed today.  Otherwise she is up-to-date on her health maintenance

## 2018-06-02 ENCOUNTER — Other Ambulatory Visit: Payer: Self-pay | Admitting: Cardiology

## 2018-06-03 NOTE — Telephone Encounter (Signed)
Outpatient Medication Detail    Disp Refills Start End   hydrochlorothiazide (HYDRODIURIL) 25 MG tablet 90 tablet 1 04/29/2018    Sig: TAKE 1 TABLET BY MOUTH DAILY   Sent to pharmacy as: hydrochlorothiazide (HYDRODIURIL) 25 MG tablet   E-Prescribing Status: Receipt confirmed by pharmacy (04/29/2018 12:29 PM EDT)   Pharmacy   [id number] [address] [address]- [place], [place] - [address]

## 2018-06-09 ENCOUNTER — Other Ambulatory Visit: Payer: Self-pay

## 2018-06-09 MED ORDER — OMEPRAZOLE 20 MG PO CPDR: 20.0000 mg | DELAYED_RELEASE_CAPSULE | Freq: Every day | ORAL | 0 refills | Status: DC

## 2018-06-10 ENCOUNTER — Other Ambulatory Visit: Payer: Self-pay | Admitting: Cardiology

## 2018-06-10 MED ORDER — HYDROCHLOROTHIAZIDE 25 MG PO TABS: 25.0000 mg | ORAL_TABLET | Freq: Every day | ORAL | 1 refills | Status: DC

## 2018-07-07 ENCOUNTER — Other Ambulatory Visit: Payer: Self-pay | Admitting: Family Medicine

## 2018-07-07 DIAGNOSIS — Z1231 Encounter for screening mammogram for malignant neoplasm of breast: Secondary | ICD-10-CM

## 2018-07-10 ENCOUNTER — Encounter (INDEPENDENT_AMBULATORY_CARE_PROVIDER_SITE_OTHER): Payer: Self-pay

## 2018-07-10 ENCOUNTER — Telehealth: Payer: Self-pay

## 2018-07-10 ENCOUNTER — Ambulatory Visit: Payer: Medicare HMO | Admitting: Cardiology

## 2018-07-10 ENCOUNTER — Encounter: Payer: Self-pay | Admitting: Cardiology

## 2018-07-10 VITALS — BP 148/86 | HR 70 | Ht 63.0 in | Wt 241.0 lb

## 2018-07-10 DIAGNOSIS — I10 Essential (primary) hypertension: Secondary | ICD-10-CM | POA: Diagnosis not present

## 2018-07-10 DIAGNOSIS — I2510 Atherosclerotic heart disease of native coronary artery without angina pectoris: Secondary | ICD-10-CM | POA: Diagnosis not present

## 2018-07-10 DIAGNOSIS — E7800 Pure hypercholesterolemia, unspecified: Secondary | ICD-10-CM | POA: Diagnosis not present

## 2018-07-10 DIAGNOSIS — I471 Supraventricular tachycardia: Secondary | ICD-10-CM | POA: Diagnosis not present

## 2018-07-10 LAB — BASIC METABOLIC PANEL
BUN / CREAT RATIO: 18 (ref 12–28)
BUN: 15 mg/dL (ref 8–27)
CALCIUM: 10.1 mg/dL (ref 8.7–10.3)
CHLORIDE: 99 mmol/L (ref 96–106)
CO2: 26 mmol/L (ref 20–29)
CREATININE: 0.85 mg/dL (ref 0.57–1.00)
GFR calc Af Amer: 81 mL/min/{1.73_m2} (ref >59)
GFR calc non Af Amer: 71 mL/min/{1.73_m2} (ref >59)
GLUCOSE: 217 mg/dL — AB (ref 65–99)
Potassium: 3.9 mmol/L (ref 3.5–5.2)
Sodium: 141 mmol/L (ref 134–144)

## 2018-07-10 NOTE — Telephone Encounter (Signed)
Notes recorded by Sigurd Sosapp, Journi Moffa, RN on 07/10/2018 at 5:22 PM EDT Informed patient of results/recommendations. She verbalized understanding. ------

## 2018-07-10 NOTE — Telephone Encounter (Signed)
-----   Message from Quintella Reichertraci R Turner, MD sent at 07/10/2018  4:48 PM EDT ----- Please let patient know that labs were normal.  Continue current medical therapy.

## 2018-07-10 NOTE — Progress Notes (Signed)
Cardiology Office Note:    Date:  07/10/2018   ID:  Meredith [name], DOB 1951-02-20, MRN [id number]  PCP:  Kathrene Alu, MD  Cardiologist:  No primary care provider on file.    Referring MD: Carlyle Dolly, MD   Chief Complaint  Patient presents with  . Coronary Artery Disease  . Hypertension  . Hyperlipidemia  . Atrial Fibrillation    History of Present Illness:    Meredith [name] is a 66 y.o. female with a hx of atrial tachycardia,persistentAF on Xarelto, nonobstructive ASCAD with 20% LCx and RCA by cath, HTN and dyslipidemia. She is here today for followup and is doing well.  She denies any chest pain or pressure, SOB, DOE, PND, orthopnea, LE edema, dizziness, palpitations or syncope. She is compliant with her meds and is tolerating meds with no SE.    Past Medical History:  Diagnosis Date  . ANEMIA, IRON DEFICIENCY, UNSPEC. 02/05/2007   Qualifier: Diagnosis of  By: Damita Dunnings MD, Phillip Heal    . CAD (coronary artery disease), native coronary artery 02/16/2015   Cath with 20% LCx and RCA  . Colon polyps   . Diabetes mellitus   . Diverticulosis 05/17/12  . DJD (degenerative joint disease) of lumbar spine   . Enteritis   . Gastric ulcer 04/2000  . Hyperlipidemia   . Hypertension   . Irregular heart beat   . Obesity   . Osteoarthritis   . Persistent atrial fibrillation (HCC)    CHADS2VASC score is 5 and on Xarelto  . Renal cyst, left 05/17/12  . Ventral hernia     Past Surgical History:  Procedure Laterality Date  . ABDOMINAL HYSTERECTOMY    . LEFT HEART CATHETERIZATION WITH CORONARY ANGIOGRAM N/A 02/16/2015   Procedure: LEFT HEART CATHETERIZATION WITH CORONARY ANGIOGRAM;  Surgeon: Burnell Blanks, MD;  Location[facility] C[facility] LAB;  Service: Cardiovascular;  Laterality: N/A;  . OPERATIVE HYSTEROSCOPY    . TUBAL LIGATION    . UTERINE FIBROID SURGERY      Current Medications: Current Meds  Medication Sig  . ACCU-CHEK SOFTCLIX LANCETS lancets Use as instructed to  test blood glucose once daily. ICD-10 code: E11.9  . acetaminophen (ACETAMINOPHEN 8 HOUR) 650 MG CR tablet Take 1 tablet (650 mg total) by mouth every 8 (eight) hours.  Marland Kitchen amLODipine (NORVASC) 10 MG tablet TAKE 1 TABLET BY MOUTH EVERY DAY  . Blood Glucose Monitoring Suppl (ACCU-CHEK AVIVA PLUS) w/Device KIT 1 kit by Does not apply route as directed. ICD-10 code: E11.9  . ezetimibe (ZETIA) 10 MG tablet TAKE 1 TABLET BY MOUTH EVERY DAY  . glucose blood (ACCU-CHEK AVIVA PLUS) test strip Use as instructed test blood glucose once daily. ICD-10 code: E11.9  . hydrochlorothiazide (HYDRODIURIL) 25 MG tablet Take 1 tablet (25 mg total) by mouth daily.  . hydrOXYzine (ATARAX/VISTARIL) 25 MG tablet Take 1 tablet (25 mg total) by mouth every 8 (eight) hours as needed for itching.  . Lancets Misc. (ACCU-CHEK SOFTCLIX LANCET DEV) KIT Use as directed to test once daily. ICD-10 code: E11.9  . metFORMIN (GLUCOPHAGE) 1000 MG tablet TAKE 1 TABLET BY MOUTH TWICE A DAY WITH MEALS  . metoprolol succinate (TOPROL-XL) 50 MG 24 hr tablet TAKE 1 TABLET EVERY DAY WITH OR IMMEDIATELY FOLLOWING A MEAL  . polyethylene glycol powder (GLYCOLAX/MIRALAX) powder Take 2 capsules daily until constipation is resolved  . potassium chloride (K-DUR) 10 MEQ tablet Take 1 tablet by mouth 2 (two) times daily.  . rosuvastatin (CRESTOR)  20 MG tablet TAKE 1 TABLET BY MOUTH EVERY DAY  . traMADol (ULTRAM) 50 MG tablet Take 1 tablet (50 mg total) by mouth every 6 (six) hours as needed (for pain).  Alveda Reasons 20 MG TABS tablet TAKE 1 TABLET BY MOUTH EVERY DAY WITH SUPPER     Allergies:   Lisinopril; Doxycycline; Flexeril [cyclobenzaprine hcl]; Lipitor [atorvastatin calcium]; and Metaxalone   Social History   Socioeconomic History  . Marital status: Divorced    Spouse name: Not on file  . Number of children: 4  . Years of education: Not on file  . Highest education level: Not on file  Occupational History  . Occupation: Airline pilot:  [employer]  . Financial resource strain: Not on file  . Food insecurity:    Worry: Not on file    Inability: Not on file  . Transportation needs:    Medical: Not on file    Non-medical: Not on file  Tobacco Use  . Smoking status: Former Smoker    Packs/day: 1.00    Years: 5.00    Pack years: 5.00    Types: Cigarettes    Last attempt to quit: 12/09/1978    Years since quitting: 39.6  . Smokeless tobacco: Never Used  Substance and Sexual Activity  . Alcohol use: No  . Drug use: No  . Sexual activity: Not on file  Lifestyle  . Physical activity:    Days per week: Not on file    Minutes per session: Not on file  . Stress: Not on file  Relationships  . Social connections:    Talks on phone: Not on file    Gets together: Not on file    Attends religious service: Not on file    Active member of club or organization: Not on file    Attends meetings of clubs or organizations: Not on file    Relationship status: Not on file  Other Topics Concern  . 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 Ducatte, RN, BSN                                                                                                   Family History: The patient's family history includes Cancer in her father and sister; Clotting disorder in her other; Diabetes in her mother; Heart disease in her father and mother; Kidney disease in her mother; Lung cancer in her father; Stomach cancer in her sister. There is no history of Colon  cancer.  ROS:   Please see the history of present illness.    ROS  All other systems reviewed and negative.   EKGs/Labs/Other Studies Reviewed:    The following studies were reviewed today: none  EKG:  EKG is not ordered today.    Recent Labs: 04/30/2018: ALT 17; BUN 17; Creatinine, Ser 1.00; Hemoglobin 13.4; Platelets 173; Potassium 3.2; Sodium 139   Recent Lipid Panel    Component Value Date/Time   CHOL 130 01/08/2018 0902   TRIG 96 01/08/2018 0902   HDL 47 01/08/2018 0902   CHOLHDL 2.8 01/08/2018 0902   CHOLHDL 2.3 05/16/2016 0739   VLDL 17 05/16/2016 0739   LDLCALC 64 01/08/2018 0902   LDLDIRECT 130 (H) 05/13/2012 0917    Physical Exam:    VS:  BP (!) 148/86   Pulse 70   Ht '5\' 3"'  (1.6 m)   Wt 241 lb (109.3 kg)   SpO2 98%   BMI 42.69 kg/m     Wt Readings from Last 3 Encounters:  07/10/18 241 lb (109.3 kg)  05/22/18 243 lb 12.8 oz (110.6 kg)  05/01/18 240 lb (108.9 kg)     GEN:  Well nourished, well developed in no acute distress HEENT: Normal NECK: No JVD; No carotid bruits LYMPHATICS: No lymphadenopathy CARDIAC: RRR, no murmurs, rubs, gallops RESPIRATORY:  Clear to auscultation without rales, wheezing or rhonchi  ABDOMEN: Soft, non-tender, non-distended MUSCULOSKELETAL:  trace edema; No deformity  SKIN: Warm and dry NEUROLOGIC:  Alert and oriented x 3 PSYCHIATRIC:  Normal affect   ASSESSMENT:    1. Coronary artery disease involving native coronary artery of native heart without angina pectoris   2. HYPERTENSION, BENIGN SYSTEMIC   3. Atrial tachycardia, paroxysmal ([place])   4. HYPERCHOLESTEROLEMIA    PLAN:    In order of problems listed above:  1.  ASCAD - 20% LCx and RCA by cath.  She denies any anginal sx. She is not on ASA due to DOAC.  She will continue on BB and statin.   2. HTN - BP is borderline controlled on exam today.  She will continue on amlodipine 39m daily, Toprol XL 541mdaily, HCTZ 2568maily.   Repeat BMET since her K+ was 3.2  in May.  3.  PAT/Persistent AF - she is maintaining NSR on exam today.  She will continue on Toprol XL 41m67mily and Xarelto 20mg26mly.  Her creatinine was 1 on 04/30/2018. Repeat BMET since her K+ was 3.2 in May.  She has not had any bleeding problems on DOAC and Hb was 13.4 in May.   4.  Hyperlipidemia - LDL goal is < 70.  Her LDL was 64 on 01/08/2018.  She will continue on Zetia 10mg 14my and crestor 20mg d33m.     Medication Adjustments/Labs and Tests Ordered: Current medicines are reviewed at length with the patient today.  Concerns regarding medicines are outlined above.  No orders of the defined types were placed in this encounter.  No orders of the defined types were placed in this encounter.   Signed, Josedejesus Marcum TFransico Him/01/2018 7:59 AM    [facility] [place]

## 2018-07-10 NOTE — Patient Instructions (Signed)
Medication Instructions:  Your physician recommends that you continue on your current medications as directed. Please refer to the Current Medication list given to you today.   Labwork: TODAY: BMET  Testing/Procedures: None ordered  Follow-Up: Your physician wants you to follow-up in: 6 months with Dr. Mayford Knifeurner. You will receive a reminder letter in the mail two months in advance. If you don't receive a letter, please call our office to schedule the follow-up appointment.   Any Other Special Instructions Will Be Listed Below (If Applicable).     If you need a refill on your cardiac medications before your next appointment, please call your pharmacy.

## 2018-07-20 ENCOUNTER — Other Ambulatory Visit: Payer: Self-pay | Admitting: *Deleted

## 2018-07-21 MED ORDER — EZETIMIBE 10 MG PO TABS: 10.0000 mg | ORAL_TABLET | Freq: Every day | ORAL | 2 refills | Status: DC

## 2018-07-30 ENCOUNTER — Other Ambulatory Visit: Payer: Self-pay

## 2018-07-30 MED ORDER — METOPROLOL SUCCINATE ER 50 MG PO TB24: ORAL_TABLET | ORAL | 0 refills | Status: DC

## 2018-07-30 MED ORDER — METFORMIN HCL 1000 MG PO TABS: 1000.0000 mg | ORAL_TABLET | Freq: Two times a day (BID) | ORAL | 1 refills | Status: DC

## 2018-08-20 ENCOUNTER — Ambulatory Visit
Admission: RE | Admit: 2018-08-20 | Discharge: 2018-08-20 | Disposition: A | Discharge status: Home / Self Care | Payer: Medicare HMO | Source: Ambulatory Visit | Attending: Family Medicine | Admitting: Family Medicine

## 2018-08-20 ENCOUNTER — Ambulatory Visit: Payer: Medicare HMO

## 2018-08-20 ENCOUNTER — Encounter: Payer: Self-pay | Admitting: Family Medicine

## 2018-08-20 DIAGNOSIS — Z1231 Encounter for screening mammogram for malignant neoplasm of breast: Secondary | ICD-10-CM

## 2018-10-02 ENCOUNTER — Telehealth: Payer: Self-pay | Admitting: Family Medicine

## 2018-10-02 NOTE — Telephone Encounter (Signed)
I have not called her.  Thanks for checking.

## 2018-10-02 NOTE — Telephone Encounter (Signed)
Spoke to pt to let her know we don't who called or why. I did tell her it may have been an automated call about an AWV. Pt said she has talked to someone about those already. Meredith [name], CMA

## 2018-10-02 NOTE — Telephone Encounter (Signed)
Pt called and said she is returning two missed calls. She said there was no voicemail so she is not sure who called.

## 2018-10-15 ENCOUNTER — Other Ambulatory Visit: Payer: Self-pay

## 2018-10-15 ENCOUNTER — Ambulatory Visit (INDEPENDENT_AMBULATORY_CARE_PROVIDER_SITE_OTHER): Payer: Medicare HMO | Admitting: Family Medicine

## 2018-10-15 ENCOUNTER — Other Ambulatory Visit: Payer: Self-pay | Admitting: Family Medicine

## 2018-10-15 ENCOUNTER — Encounter: Payer: Self-pay | Admitting: Family Medicine

## 2018-10-15 VITALS — BP 120/70 | HR 78 | Temp 98.5°F | Ht 63.0 in | Wt 236.0 lb

## 2018-10-15 DIAGNOSIS — M5137 Other intervertebral disc degeneration, lumbosacral region: Secondary | ICD-10-CM

## 2018-10-15 DIAGNOSIS — E119 Type 2 diabetes mellitus without complications: Secondary | ICD-10-CM | POA: Diagnosis not present

## 2018-10-15 LAB — POCT GLYCOSYLATED HEMOGLOBIN (HGB A1C): HbA1c, POC (controlled diabetic range): 8.7 % — AB (ref 0.0–7.0)

## 2018-10-15 MED ORDER — EMPAGLIFLOZIN 10 MG PO TABS: 10.0000 mg | ORAL_TABLET | Freq: Every day | ORAL | 0 refills | Status: DC

## 2018-10-15 NOTE — Patient Instructions (Addendum)
Thanks for coming in today, it was a great to meet you.  Here is a quick summary of what we talked about.  Back pain:  I'm glad that you feel your back pain is improving.  Continue to treat your pain with tylenol and occasional Tramadol for flares.  If it worsens, we can think about physical therapy.  Diabetes:  I think that it would be beneficial for you to be on a second diabetes medication.  Jardiance would be a particularly good one. Take Jardiance 10 mg once daily for one week.  If you feel comfortable taking this medication after one week, continue taking it.  If you have concerns about side effects, call the clinic.   Osteoarthritis Osteoarthritis is a type of arthritis that affects tissue that covers the ends of bones in joints (cartilage). Cartilage acts as a cushion between the bones and helps them move smoothly. Osteoarthritis results when cartilage in the joints gets worn down. Osteoarthritis is sometimes called "wear and tear" arthritis. Osteoarthritis is the most common form of arthritis. It often occurs in older people. It is a condition that gets worse over time (a progressive condition). Joints that are most often affected by this condition are in:  Fingers.  Toes.  Hips.  Knees.  Spine, including neck and lower back.  What are the causes? This condition is caused by age-related wearing down of cartilage that covers the ends of bones. What increases the risk? The following factors may make you more likely to develop this condition:  Older age.  Being overweight or obese.  Overuse of joints, such as in athletes.  Past injury of a joint.  Past surgery on a joint.  Family history of osteoarthritis.  What are the signs or symptoms? The main symptoms of this condition are pain, swelling, and stiffness in the joint. The joint may lose its shape over time. Small pieces of bone or cartilage may break off and float inside of the joint, which may cause more pain and  damage to the joint. Small deposits of bone (osteophytes) may grow on the edges of the joint. Other symptoms may include:  A grating or scraping feeling inside the joint when you move it.  Popping or creaking sounds when you move.  Symptoms may affect one or more joints. Osteoarthritis in a major joint, such as your knee or hip, can make it painful to walk or exercise. If you have osteoarthritis in your hands, you might not be able to grip items, twist your hand, or control small movements of your hands and fingers (fine motor skills). How is this diagnosed? This condition may be diagnosed based on:  Your medical history.  A physical exam.  Your symptoms.  X-rays of the affected joint(s).  Blood tests to rule out other types of arthritis.  How is this treated? There is no cure for this condition, but treatment can help to control pain and improve joint function. Treatment plans may include:  A prescribed exercise program that allows for rest and joint relief. You may work with a physical therapist.  A weight control plan.  Pain relief techniques, such as: ? Applying heat and cold to the joint. ? Electric pulses delivered to nerve endings under the skin (transcutaneous electrical nerve stimulation, or TENS). ? Massage. ? Certain nutritional supplements.  NSAIDs or prescription medicines to help relieve pain.  Medicine to help relieve pain and inflammation (corticosteroids). This can be given by mouth (orally) or as an injection.  Assistive devices, such as a brace, wrap, splint, specialized glove, or cane.  Surgery, such as: ? An osteotomy. This is done to reposition the bones and relieve pain or to remove loose pieces of bone and cartilage. ? Joint replacement surgery. You may need this surgery if you have very bad (advanced) osteoarthritis.  Follow these instructions at home: Activity  Rest your affected joints as directed by your health care provider.  Do not drive  or use heavy machinery while taking prescription pain medicine.  Exercise as directed. Your health care provider or physical therapist may recommend specific types of exercise, such as: ? Strengthening exercises. These are done to strengthen the muscles that support joints that are affected by arthritis. They can be performed with weights or with exercise bands to add resistance. ? Aerobic activities. These are exercises, such as brisk walking or water aerobics, that get your heart pumping. ? Range-of-motion activities. These keep your joints easy to move. ? Balance and agility exercises. Managing pain, stiffness, and swelling  If directed, apply heat to the affected area as often as told by your health care provider. Use the heat source that your health care provider recommends, such as a moist heat pack or a heating pad. ? If you have a removable assistive device, remove it as told by your health care provider. ? Place a towel between your skin and the heat source. If your health care provider tells you to keep the assistive device on while you apply heat, place a towel between the assistive device and the heat source. ? Leave the heat on for 20-30 minutes. ? Remove the heat if your skin turns bright red. This is especially important if you are unable to feel pain, heat, or cold. You may have a greater risk of getting burned.  If directed, put ice on the affected joint: ? If you have a removable assistive device, remove it as told by your health care provider. ? Put ice in a plastic bag. ? Place a towel between your skin and the bag. If your health care provider tells you to keep the assistive device on during icing, place a towel between the assistive device and the bag. ? Leave the ice on for 20 minutes, 2-3 times a day. General instructions  Take over-the-counter and prescription medicines only as told by your health care provider.  Maintain a healthy weight. Follow instructions from  your health care provider for weight control. These may include dietary restrictions.  Do not use any products that contain nicotine or tobacco, such as cigarettes and e-cigarettes. These can delay bone healing. If you need help quitting, ask your health care provider.  Use assistive devices as directed by your health care provider.  Keep all follow-up visits as told by your health care provider. This is important. Where to find more information:  [employer] of Arthritis and Musculoskeletal and Skin Diseases: www.niams.http://www.myers.net/  [employer] on Aging: https://walker.com/  American College of Rheumatology: www.rheumatology.org Contact a health care provider if:  Your skin turns red.  You develop a rash.  You have pain that gets worse.  You have a fever along with joint or muscle aches. Get help right away if:  You lose a lot of weight.  You suddenly lose your appetite.  You have night sweats. Summary  Osteoarthritis is a type of arthritis that affects tissue covering the ends of bones in joints (cartilage).  This condition is caused by age-related wearing down of cartilage that  covers the ends of bones.  The main symptom of this condition is pain, swelling, and stiffness in the joint.  There is no cure for this condition, but treatment can help to control pain and improve joint function. This information is not intended to replace advice given to you by your health care provider. Make sure you discuss any questions you have with your health care provider. Document Released: 11/25/2005 Document Revised: 07/29/2016 Document Reviewed: 07/29/2016 Elsevier Interactive Patient Education  [employer].

## 2018-10-15 NOTE — Progress Notes (Signed)
    Subjective:  Meredith [name] is a 67 y.o. female who presents to the [facility] today with a chief complaint of back pain.   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  She reports in clinic today that her back pain has significantly improved since she stopped taking her potassium supplements about one week ago.  She has no new concerns about her back pain which she treats with tylenol and tramadol (though she rarely uses the tramadol).  She expressed concern regarding her potassium supplements because she remembers that her mother too potassium supplements before her death as she because more medically complicated, she worries that the potassium may lead to more medical complications.  She was also told by her sister that she can get potassium in bananas and does not need to take the pills.  Diabetes:  She has previously been seen for diabetes and was recommended to begin jardiance in addition to her metformin.  She filled the prescription but never took the jardiance because she saw an infomercial that described significant side effects included heart palpitations.  She was hope that her blood glucose would be appropriately controlled with simply the metformin.   Objective:  Physical Exam: BP 120/70   Pulse 78   Temp 98.5 F (36.9 C) (Oral)   Ht 5\' 3"  (1.6 m)   Wt 236 lb (107 kg)   SpO2 98%   BMI 41.81 kg/m    Gen: NAD, resting comfortably MSK:no tenderness to palpation of paraspinal muscles, no tenderness of thoracic/lumbar spine or SI joint.  Pt able to ambulate with a small gait and minor discomfort.  Results for orders placed or performed in visit on 10/15/18 (from the past 72 hour(s))  POCT glycosylated hemoglobin (Hb A1C)     Status: Abnormal   Collection Time: 10/15/18  8:55 AM  Result Value Ref Range   Hemoglobin A1C     HbA1c POC (<> result, manual entry)     HbA1c, POC (prediabetic range)     HbA1c, POC (controlled diabetic  range) 8.7 (A) 0.0 - 7.0 %     Assessment/Plan:  DEGENERATIVE DISC DISEASE, LUMBAR SPINE The reason for pt's potassium supplementation was discussed and it was explained that the timing of her decreased back pain and cessation of potassium was likely coincidental.  If she neglects her potassium too long, she will likely experience muscle spasms which will worsen her back pain.  Pt understood and agreed to continue her potassium supplementation.   -Continue Tylenol and tramadol PRN for back pain  Diabetes mellitus type II, controlled, with no complications (HCC) A1c previously 8.5, now 8.7. Pt understands that this signfies a worsening control of her blood sugar and that I agree with her previous doctors who recommended starting a second oral agent.  We discussed the benefits of starting Jardiance and she was agreeable to a trial of jardiance. -Continue metformin 1000 mg BID -Pt to take jardiance 10mg  daily for one week while she monitors for side effects. If she notices side effects, she will call back and we can attempt a different medication. If she has no side effects, she will continue taking the jardiance.  -f/u in 3 months for A1c check

## 2018-10-17 NOTE — Assessment & Plan Note (Signed)
The reason for pt's potassium supplementation was discussed and it was explained that the timing of her decreased back pain and cessation of potassium was likely coincidental.  If she neglects her potassium too long, she will likely experience muscle spasms which will worsen her back pain.  Pt understood and agreed to continue her potassium supplementation.   -Continue Tylenol and tramadol PRN for back pain

## 2018-10-17 NOTE — Assessment & Plan Note (Addendum)
A1c previously 8.5, now 8.7. Pt understands that this signfies a worsening control of her blood sugar and that I agree with her previous doctors who recommended starting a second oral agent.  We discussed the benefits of starting Jardiance and she was agreeable to a trial of jardiance. -Continue metformin 1000 mg BID -Pt to take jardiance 10mg  daily for one week while she monitors for side effects. If she notices side effects, she will call back and we can attempt a different medication. If she has no side effects, she will continue taking the jardiance.  -f/u in 3 months for A1c check

## 2018-10-22 ENCOUNTER — Emergency Department (HOSPITAL_COMMUNITY)
Admission: EM | Admit: 2018-10-22 | Discharge: 2018-10-22 | Disposition: A | Discharge status: Home / Self Care | Payer: Medicare HMO | Attending: Emergency Medicine | Admitting: Emergency Medicine

## 2018-10-22 ENCOUNTER — Encounter (HOSPITAL_COMMUNITY): Payer: Self-pay | Admitting: Emergency Medicine

## 2018-10-22 DIAGNOSIS — E119 Type 2 diabetes mellitus without complications: Secondary | ICD-10-CM | POA: Diagnosis not present

## 2018-10-22 DIAGNOSIS — M545 Low back pain, unspecified: Secondary | ICD-10-CM

## 2018-10-22 DIAGNOSIS — E876 Hypokalemia: Secondary | ICD-10-CM | POA: Diagnosis not present

## 2018-10-22 DIAGNOSIS — Z79899 Other long term (current) drug therapy: Secondary | ICD-10-CM | POA: Insufficient documentation

## 2018-10-22 DIAGNOSIS — Z7984 Long term (current) use of oral hypoglycemic drugs: Secondary | ICD-10-CM | POA: Insufficient documentation

## 2018-10-22 DIAGNOSIS — Z87891 Personal history of nicotine dependence: Secondary | ICD-10-CM | POA: Insufficient documentation

## 2018-10-22 LAB — URINALYSIS, ROUTINE W REFLEX MICROSCOPIC
Bacteria, UA: NONE SEEN
Bilirubin Urine: NEGATIVE
KETONES UR: NEGATIVE mg/dL
LEUKOCYTES UA: NEGATIVE
NITRITE: NEGATIVE
PROTEIN: 30 mg/dL — AB
Specific Gravity, Urine: 1.016 (ref 1.005–1.030)
pH: 6 (ref 5.0–8.0)

## 2018-10-22 LAB — I-STAT CHEM 8, ED
BUN: 24 mg/dL — ABNORMAL HIGH (ref 8–23)
CALCIUM ION: 1.22 mmol/L (ref 1.15–1.40)
CREATININE: 1.4 mg/dL — AB (ref 0.44–1.00)
Chloride: 100 mmol/L (ref 98–111)
GLUCOSE: 146 mg/dL — AB (ref 70–99)
HCT: 38 % (ref 36.0–46.0)
HEMOGLOBIN: 12.9 g/dL (ref 12.0–15.0)
Potassium: 2.9 mmol/L — ABNORMAL LOW (ref 3.5–5.1)
Sodium: 140 mmol/L (ref 135–145)
TCO2: 29 mmol/L (ref 22–32)

## 2018-10-22 MED ORDER — DIAZEPAM 5 MG PO TABS: 2.5000 mg | ORAL_TABLET | Freq: Three times a day (TID) | ORAL | 0 refills | Status: DC | PRN

## 2018-10-22 MED ORDER — DIAZEPAM 2 MG PO TABS
2.0000 mg | ORAL_TABLET | Freq: Once | ORAL | Status: AC
  Administered 2018-10-22: 2 mg via ORAL
  Filled 2018-10-22: qty 1

## 2018-10-22 MED ORDER — LIDOCAINE 5 % EX PTCH: 1.0000 | MEDICATED_PATCH | CUTANEOUS | 0 refills | Status: DC

## 2018-10-22 MED ORDER — ACETAMINOPHEN ER 650 MG PO TBCR: 650.0000 mg | EXTENDED_RELEASE_TABLET | Freq: Three times a day (TID) | ORAL | 0 refills | Status: DC

## 2018-10-22 MED ORDER — POTASSIUM CHLORIDE CRYS ER 20 MEQ PO TBCR
80.0000 meq | EXTENDED_RELEASE_TABLET | Freq: Once | ORAL | Status: AC
  Administered 2018-10-22: 80 meq via ORAL
  Filled 2018-10-22: qty 4

## 2018-10-22 MED ORDER — ACETAMINOPHEN 500 MG PO TABS
1000.0000 mg | ORAL_TABLET | Freq: Once | ORAL | Status: AC
  Administered 2018-10-22: 1000 mg via ORAL
  Filled 2018-10-22: qty 2

## 2018-10-22 MED ORDER — LIDOCAINE 5 % EX PTCH
1.0000 | MEDICATED_PATCH | Freq: Once | CUTANEOUS | Status: DC
  Administered 2018-10-22: 1 via TRANSDERMAL
  Filled 2018-10-22: qty 1

## 2018-10-22 NOTE — ED Provider Notes (Signed)
[facility] EMERGENCY DEPARTMENT Provider Note   CSN: [id number] Arrival date & time: 10/22/18  6433     History   Chief Complaint Chief Complaint  Patient presents with  . Back Pain    HPI VARIE MACHAMER is a 67 y.o. female.   Back Pain   This is a new problem. The current episode started more than 1 week ago. The problem occurs constantly. The problem has not changed since onset.The pain is associated with no known injury. Pain location: right paraspinal lumbar. The quality of the pain is described as stabbing and cramping. The pain does not radiate. Pertinent negatives include no dysuria.    Past Medical History:  Diagnosis Date  . ANEMIA, IRON DEFICIENCY, UNSPEC. 02/05/2007   Qualifier: Diagnosis of  By: Damita Dunnings MD, Phillip Heal    . CAD (coronary artery disease), native coronary artery 02/16/2015   Cath with 20% LCx and RCA  . Colon polyps   . Diabetes mellitus   . Diverticulosis 05/17/12  . DJD (degenerative joint disease) of lumbar spine   . Enteritis   . Gastric ulcer 04/2000  . Hyperlipidemia   . Hypertension   . Irregular heart beat   . Obesity   . Osteoarthritis   . Persistent atrial fibrillation    CHADS2VASC score is 5 and on Xarelto  . Renal cyst, left 05/17/12  . Ventral hernia     Patient Active Problem List   Diagnosis Date Noted  . Osteopenia 09/15/2017  . Pain of both hip joints 06/13/2017  . Knee pain, chronic 07/21/2016  . CTS (carpal tunnel syndrome) 06/06/2016  . Estrogen deficiency 03/29/2016  . CAD (coronary artery disease), native coronary artery 10/26/2015  . Back pain 08/17/2015  . Persistent atrial fibrillation 02/23/2015  . PAC (premature atrial contraction) 01/19/2015  . Atrial tachycardia, paroxysmal ([place]) 01/19/2015  . Irregular heart rhythm 11/25/2014  . Healthcare maintenance 03/04/2014  . ASTHMA, INTERMITTENT 09/07/2010  . HERNIA, VENTRAL 04/18/2010  . 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  Diabetes mellitus type II, controlled, with no complications ([place]) 29/51/8841  . HYPERCHOLESTEROLEMIA 02/05/2007  . Obesity 02/05/2007  . DEPRESSION, MAJOR, RECURRENT 02/05/2007  . HYPERTENSION, BENIGN SYSTEMIC 02/05/2007  . GASTROESOPHAGEAL REFLUX, NO ESOPHAGITIS 02/05/2007  . OSTEOARTHRITIS, MULTI SITES 02/05/2007    Past Surgical History:  Procedure Laterality Date  . ABDOMINAL HYSTERECTOMY    . LEFT HEART CATHETERIZATION WITH CORONARY ANGIOGRAM N/A 02/16/2015   Procedure: LEFT HEART CATHETERIZATION WITH CORONARY ANGIOGRAM;  Surgeon: Burnell Blanks, MD;  Location: [facility] CATH LAB;  Service: Cardiovascular;  Laterality: N/A;  . OPERATIVE HYSTEROSCOPY    . TUBAL LIGATION    . UTERINE FIBROID SURGERY       OB History   None      Home Medications    Prior to Admission medications   Medication Sig Start Date End Date Taking? Authorizing Provider  amLODipine (NORVASC) 10 MG tablet TAKE 1 TABLET BY MOUTH EVERY DAY Patient taking differently: Take 10 mg by mouth daily.  05/12/18  Yes Turner, Eber Hong, MD  ezetimibe (ZETIA) 10 MG tablet TAKE 1 TABLET BY MOUTH EVERY DAY Patient taking differently: Take 10 mg by mouth daily.  10/15/18  Yes [name], Alcario Drought, MD  hydrochlorothiazide (HYDRODIURIL) 25 MG tablet Take 1 tablet (25 mg total) by mouth daily. 06/10/18  Yes Turner, Eber Hong, MD  metFORMIN (GLUCOPHAGE) 1000 MG tablet Take 1 tablet (1,000 mg total) by mouth 2 (two) times daily with a  meal. 07/30/18  Yes [name], Alcario Drought, MD  metoprolol succinate (TOPROL-XL) 50 MG 24 hr tablet Take with or immediately following a meal. Patient taking differently: Take 50 mg by mouth daily. Take with or immediately following a meal. 07/30/18  Yes [name], Alcario Drought, MD  potassium chloride (K-DUR) 10 MEQ tablet Take 10 mEq by mouth 2 (two) times daily.  07/05/18  Yes [provider]  rosuvastatin (CRESTOR) 20 MG tablet TAKE 1 TABLET BY MOUTH EVERY DAY Patient taking differently: Take 20 mg by  mouth daily.  04/07/18  Yes Turner, Traci R, MD  XARELTO 20 MG TABS tablet TAKE 1 TABLET BY MOUTH EVERY DAY WITH SUPPER Patient taking differently: Take 20 mg by mouth every evening.  04/07/18  Yes Turner, Eber Hong, MD  ACCU-CHEK SOFTCLIX LANCETS lancets Use as instructed to test blood glucose once daily. ICD-10 code: E11.9 09/17/17   McDiarmid, Blane Ohara, MD  acetaminophen (ACETAMINOPHEN 8 HOUR) 650 MG CR tablet Take 1 tablet (650 mg total) by mouth every 8 (eight) hours. 10/22/18   Haile Bosler, Corene Cornea, MD  Blood Glucose Monitoring Suppl (ACCU-CHEK AVIVA PLUS) w/Device KIT 1 kit by Does not apply route as directed. ICD-10 code: E11.9 09/17/17   McDiarmid, Blane Ohara, MD  diazepam (VALIUM) 5 MG tablet Take 0.5 tablets (2.5 mg total) by mouth every 8 (eight) hours as needed for anxiety or muscle spasms (spasms). 10/22/18   Karyssa Amaral, Corene Cornea, MD  glucose blood (ACCU-CHEK AVIVA PLUS) test strip Use as instructed test blood glucose once daily. ICD-10 code: E11.9 09/17/17   McDiarmid, Blane Ohara, MD  Lancets Misc. (ACCU-CHEK SOFTCLIX LANCET DEV) KIT Use as directed to test once daily. ICD-10 code: E11.9 09/17/17   McDiarmid, Blane Ohara, MD  lidocaine (LIDODERM) 5 % Place 1 patch onto the skin daily. Remove & Discard patch within 12 hours or as directed by MD 10/22/18   Ananda Caya, Corene Cornea, MD    Family History Family History  Problem Relation Age of Onset  . Diabetes Mother   . Kidney disease Mother   . Heart disease Mother   . Lung cancer Father   . Heart disease Father   . Cancer Father   . Stomach cancer Sister   . Cancer Sister   . Clotting disorder Other        neice  . Colon cancer Neg Hx     Social History Social History   Tobacco Use  . Smoking status: Former Smoker    Packs/day: 1.00    Years: 5.00    Pack years: 5.00    Types: Cigarettes    Last attempt to quit: 12/09/1978    Years since quitting: 39.8  . Smokeless tobacco: Never Used  Substance Use Topics  . Alcohol use: No  . Drug use: No      Allergies   Lisinopril; Doxycycline; Flexeril [cyclobenzaprine hcl]; Lipitor [atorvastatin calcium]; and Metaxalone   Review of Systems Review of Systems  Gastrointestinal: Negative for constipation and diarrhea.  Genitourinary: Negative for decreased urine volume, dysuria and urgency.  Musculoskeletal: Positive for back pain.  All other systems reviewed and are negative.    Physical Exam Updated Vital Signs BP (!) 158/81 (BP Location: Right Arm)   Pulse 77   Temp 98.2 F (36.8 C) (Oral)   Resp 18   SpO2 100%   Physical Exam  Constitutional: She is oriented to person, place, and time. She appears well-developed and well-nourished.  HENT:  Head: Normocephalic and atraumatic.  Eyes: Conjunctivae and  EOM are normal.  Neck: Normal range of motion.  Cardiovascular: Normal rate and regular rhythm.  Pulmonary/Chest: Effort normal and breath sounds normal. No stridor. No respiratory distress.  Abdominal: Soft. Bowel sounds are normal. She exhibits no distension.  Musculoskeletal: Normal range of motion. She exhibits tenderness (right paraspinal lumbar area, worse with palpation and bending over). She exhibits no edema or deformity.  Neurological: She is alert and oriented to person, place, and time. No cranial nerve deficit.  Skin: Skin is warm and dry.  Nursing note and vitals reviewed.    ED Treatments / Results  Labs (all labs ordered are listed, but only abnormal results are displayed) Labs Reviewed  URINALYSIS, ROUTINE W REFLEX MICROSCOPIC - Abnormal; Notable for the following components:      Result Value   APPearance HAZY (*)    Glucose, UA >=500 (*)    Hgb urine dipstick SMALL (*)    Protein, ur 30 (*)    All other components within normal limits  I-STAT CHEM 8, ED - Abnormal; Notable for the following components:   Potassium 2.9 (*)    BUN 24 (*)    Creatinine, Ser 1.40 (*)    Glucose, Bld 146 (*)    All other components within normal limits  URINE  CULTURE    EKG None  Radiology No results found.  Procedures Procedures (including critical care time)  Medications Ordered in ED Medications  lidocaine (LIDODERM) 5 % 1 patch (1 patch Transdermal Patch Applied 10/22/18 0540)  acetaminophen (TYLENOL) tablet 1,000 mg (1,000 mg Oral Given 10/22/18 0541)  diazepam (VALIUM) tablet 2 mg (2 mg Oral Given 10/22/18 0541)  potassium chloride SA (K-DUR,KLOR-CON) CR tablet 80 mEq (80 mEq Oral Given 10/22/18 0617)     Initial Impression / Assessment and Plan / ED Course  I have reviewed the triage vital signs and the nursing notes.  Pertinent labs & imaging results that were available during my care of the patient were reviewed by me and considered in my medical decision making (see chart for details).  Likely MSK back pain. H/o hypokalemia, will check. Also urine. otherwise symptomatic treatment and contined PCP follow up.  K low but not needing admission, will continue home meds. Urine with leukocytes, but no other e/o infection, will culture.   Stable for dc.    Final Clinical Impressions(s) / ED Diagnoses   Final diagnoses:  Acute right-sided low back pain without sciatica  Hypokalemia    ED Discharge Orders         Ordered    acetaminophen (ACETAMINOPHEN 8 HOUR) 650 MG CR tablet  Every 8 hours     10/22/18 0622    diazepam (VALIUM) 5 MG tablet  Every 8 hours PRN     10/22/18 0622    lidocaine (LIDODERM) 5 %  Every 24 hours     10/22/18 0622           Giovani Neumeister, Corene Cornea, MD 10/22/18 [phone]

## 2018-10-22 NOTE — ED Triage Notes (Signed)
Patient reports intermittent right lower back pain for > 1 month worse with movement /changing positions , denies injury or urinary discomfort .

## 2018-10-23 ENCOUNTER — Other Ambulatory Visit: Payer: Self-pay | Admitting: Family Medicine

## 2018-10-23 LAB — URINE CULTURE: Culture: 20000 — AB

## 2018-11-04 ENCOUNTER — Other Ambulatory Visit: Payer: Self-pay | Admitting: Cardiology

## 2018-12-03 ENCOUNTER — Other Ambulatory Visit: Payer: Self-pay | Admitting: Cardiology

## 2018-12-03 NOTE — Telephone Encounter (Signed)
Xarelto 20mg  refill request received; pt is 67 yrs old, wt-107kg, Crea-1.40 on 10/22/18, last seen by Dr. Mayford Knifeurner on 07/10/18, CrCl-64.4195ml/min; will send in refill to requested pharmacy.

## 2018-12-04 ENCOUNTER — Other Ambulatory Visit: Payer: Self-pay

## 2018-12-04 ENCOUNTER — Ambulatory Visit (INDEPENDENT_AMBULATORY_CARE_PROVIDER_SITE_OTHER): Payer: Medicare HMO | Admitting: Family Medicine

## 2018-12-04 ENCOUNTER — Encounter: Payer: Self-pay | Admitting: Family Medicine

## 2018-12-04 VITALS — BP 122/72 | HR 67 | Temp 98.5°F | Ht 63.0 in | Wt 229.6 lb

## 2018-12-04 DIAGNOSIS — G2581 Restless legs syndrome: Secondary | ICD-10-CM | POA: Insufficient documentation

## 2018-12-04 DIAGNOSIS — G8929 Other chronic pain: Secondary | ICD-10-CM | POA: Diagnosis not present

## 2018-12-04 DIAGNOSIS — M545 Low back pain: Secondary | ICD-10-CM

## 2018-12-04 DIAGNOSIS — E876 Hypokalemia: Secondary | ICD-10-CM | POA: Insufficient documentation

## 2018-12-04 MED ORDER — TRAMADOL HCL 50 MG PO TABS: 50.0000 mg | ORAL_TABLET | Freq: Three times a day (TID) | ORAL | 0 refills | Status: DC | PRN

## 2018-12-04 MED ORDER — BACLOFEN 10 MG PO TABS: 10.0000 mg | ORAL_TABLET | Freq: Three times a day (TID) | ORAL | 2 refills | Status: DC | PRN

## 2018-12-04 MED ORDER — ROPINIROLE HCL 0.25 MG PO TABS: 0.2500 mg | ORAL_TABLET | Freq: Three times a day (TID) | ORAL | 1 refills | Status: DC

## 2018-12-04 MED ORDER — RIVAROXABAN 20 MG PO TABS: ORAL_TABLET | ORAL | 2 refills | Status: DC

## 2018-12-04 NOTE — Assessment & Plan Note (Signed)
Will try Requip 0.25 mg nightly to see if this helps her restless leg syndrome.

## 2018-12-04 NOTE — Patient Instructions (Addendum)
It was nice seeing you today Meredith [name]!  Congratulations on your weight loss!  Please continue to reduce the sugar in your diet and continue to walk daily.  For your back pain, we can try baclofen as needed and continue your tramadol as needed.  You can stop the diazepam that was prescribed in the emergency department.  For your restless legs, we can try ropinirole at night.  We can increase this dose later if you find it to be helpful.  We are measuring your potassium today.  I will call you if this level is abnormal.  I have also refilled your Xarelto.  If you have any questions or concerns, please feel free to call the clinic.   Be well,  Dr. Frances FurbishWinfrey  Chronic Back Pain When back pain lasts longer than 3 months, it is called chronic back pain.The cause of your back pain may not be known. Some common causes include:  Wear and tear (degenerative disease) of the bones, ligaments, or disks in your back.  Inflammation and stiffness in your back (arthritis). People who have chronic back pain often go through certain periods in which the pain is more intense (flare-ups). Many people can learn to manage the pain with home care. Follow these instructions at home: Pay attention to any changes in your symptoms. Take these actions to help with your pain: Activity   Avoid bending and other activities that make the problem worse.  Maintain a proper position when standing or sitting: ? When standing, keep your upper back and neck straight, with your shoulders pulled back. Avoid slouching. ? When sitting, keep your back straight and relax your shoulders. Do not round your shoulders or pull them backward.  Do not sit or stand in one place for long periods of time.  Take brief periods of rest throughout the day. This will reduce your pain. Resting in a lying or standing position is usually better than sitting to rest.  When you are resting for longer periods, mix in some mild activity or  stretching between periods of rest. This will help to prevent stiffness and pain.  Get regular exercise. Ask your health care provider what activities are safe for you.  Do not lift anything that is heavier than 10 lb (4.5 kg). Always use proper lifting technique, which includes: ? Bending your knees. ? Keeping the load close to your body. ? Avoiding twisting.  Sleep on a firm mattress in a comfortable position. Try lying on your side with your knees slightly bent. If you lie on your back, put a pillow under your knees. Managing pain  If directed, apply ice to the painful area. Your health care provider may recommend applying ice during the first 24-48 hours after a flare-up begins. ? Put ice in a plastic bag. ? Place a towel between your skin and the bag. ? Leave the ice on for 20 minutes, 2-3 times per day.  If directed, apply heat to the affected area as often as told by your health care provider. Use the heat source that your health care provider recommends, such as a moist heat pack or a heating pad. ? Place a towel between your skin and the heat source. ? Leave the heat on for 20-30 minutes. ? Remove the heat if your skin turns bright red. This is especially important if you are unable to feel pain, heat, or cold. You may have a greater risk of getting burned.  Try soaking in a warm tub.  Take over-the-counter and prescription medicines only as told by your health care provider.  Keep all follow-up visits as told by your health care provider. This is important. Contact a health care provider if:  You have pain that is not relieved with rest or medicine. Get help right away if:  You have weakness or numbness in one or both of your legs or feet.  You have trouble controlling your bladder or your bowels.  You have nausea or vomiting.  You have pain in your abdomen.  You have shortness of breath or you faint. This information is not intended to replace advice given to you  by your health care provider. Make sure you discuss any questions you have with your health care provider. Document Released: 01/02/2005 Document Revised: 07/02/2018 Document Reviewed: 06/04/2017 Elsevier Interactive Patient Education  2019 [employer]  Back Exercises The following exercises strengthen the muscles that help to support the back. They also help to keep the lower back flexible. Doing these exercises can help to prevent back pain or lessen existing pain. If you have back pain or discomfort, try doing these exercises 2-3 times each day or as told by your health care provider. When the pain goes away, do them once each day, but increase the number of times that you repeat the steps for each exercise (do more repetitions). If you do not have back pain or discomfort, do these exercises once each day or as told by your health care provider. Exercises Single Knee to Chest Repeat these steps 3-5 times for each leg: 1. Lie on your back on a firm bed or the floor with your legs extended. 2. Bring one knee to your chest. Your other leg should stay extended and in contact with the floor. 3. Hold your knee in place by grabbing your knee or thigh. 4. Pull on your knee until you feel a gentle stretch in your lower back. 5. Hold the stretch for 10-30 seconds. 6. Slowly release and straighten your leg. Pelvic Tilt Repeat these steps 5-10 times: 1. Lie on your back on a firm bed or the floor with your legs extended. 2. Bend your knees so they are pointing toward the ceiling and your feet are flat on the floor. 3. Tighten your lower abdominal muscles to press your lower back against the floor. This motion will tilt your pelvis so your tailbone points up toward the ceiling instead of pointing to your feet or the floor. 4. With gentle tension and even breathing, hold this position for 5-10 seconds. Cat-Cow Repeat these steps until your lower back becomes more flexible: 1. Get into a  hands-and-knees position on a firm surface. Keep your hands under your shoulders, and keep your knees under your hips. You may place padding under your knees for comfort. 2. Let your head hang down, and point your tailbone toward the floor so your lower back becomes rounded like the back of a cat. 3. Hold this position for 5 seconds. 4. Slowly lift your head and point your tailbone up toward the ceiling so your back forms a sagging arch like the back of a cow. 5. Hold this position for 5 seconds.  Press-Ups Repeat these steps 5-10 times: 1. Lie on your abdomen (face-down) on the floor. 2. Place your palms near your head, about shoulder-width apart. 3. While you keep your back as relaxed as possible and keep your hips on the floor, slowly straighten your arms to raise the top half of your body  and lift your shoulders. Do not use your back muscles to raise your upper torso. You may adjust the placement of your hands to make yourself more comfortable. 4. Hold this position for 5 seconds while you keep your back relaxed. 5. Slowly return to lying flat on the floor.  Bridges Repeat these steps 10 times: 1. Lie on your back on a firm surface. 2. Bend your knees so they are pointing toward the ceiling and your feet are flat on the floor. 3. Tighten your buttocks muscles and lift your buttocks off of the floor until your waist is at almost the same height as your knees. You should feel the muscles working in your buttocks and the back of your thighs. If you do not feel these muscles, slide your feet 1-2 inches farther away from your buttocks. 4. Hold this position for 3-5 seconds. 5. Slowly lower your hips to the starting position, and allow your buttocks muscles to relax completely. If this exercise is too easy, try doing it with your arms crossed over your chest. Abdominal Crunches Repeat these steps 5-10 times: 1. Lie on your back on a firm bed or the floor with your legs extended. 2. Bend your  knees so they are pointing toward the ceiling and your feet are flat on the floor. 3. Cross your arms over your chest. 4. Tip your chin slightly toward your chest without bending your neck. 5. Tighten your abdominal muscles and slowly raise your trunk (torso) high enough to lift your shoulder blades a tiny bit off of the floor. Avoid raising your torso higher than that, because it can put too much stress on your low back and it does not help to strengthen your abdominal muscles. 6. Slowly return to your starting position. Back Lifts Repeat these steps 5-10 times: 1. Lie on your abdomen (face-down) with your arms at your sides, and rest your forehead on the floor. 2. Tighten the muscles in your legs and your buttocks. 3. Slowly lift your chest off of the floor while you keep your hips pressed to the floor. Keep the back of your head in line with the curve in your back. Your eyes should be looking at the floor. 4. Hold this position for 3-5 seconds. 5. Slowly return to your starting position. Contact a health care provider if:  Your back pain or discomfort gets much worse when you do an exercise.  Your back pain or discomfort does not lessen within 2 hours after you exercise. If you have any of these problems, stop doing these exercises right away. Do not do them again unless your health care provider says that you can. Get help right away if:  You develop sudden, severe back pain. If this happens, stop doing the exercises right away. Do not do them again unless your health care provider says that you can. This information is not intended to replace advice given to you by your health care provider. Make sure you discuss any questions you have with your health care provider. Document Released: 01/02/2005 Document Revised: 03/31/2018 Document Reviewed: 01/19/2015 Elsevier Interactive Patient Education  [employer].

## 2018-12-04 NOTE — Assessment & Plan Note (Signed)
Will obtain BMP today 

## 2018-12-04 NOTE — Progress Notes (Signed)
Subjective:    Meredith [name] - 67 y.o. female MRN [id number]  Date of birth: May 26, 1951  CC:  Meredith MateBetty K [name] is here for follow up of back pain.  HPI:  Back and knee pain Has some restlessness during the night and often wakes up due to restlessness and pain Has tried hot/cold patches, which often slide off of her back during the night Takes tylenol every 8 hours except she sometimes forgets She tried PT for her hand pain, and she cannot afford PT for her back Does walk to the general store near her house and will walk loops around her house daily for exercise Has lost 7 lbs since her ED visit because she is reducing her consumption of sugary foods   Health Maintenance:  Health Maintenance Due  Topic Date Due  . PNA vac Low Risk Adult (1 of 2 - PCV13) 03/04/2015  . OPHTHALMOLOGY EXAM  09/05/2017  . TETANUS/TDAP  12/10/2017  . FOOT EXAM  08/14/2018    -  reports that she quit smoking about 40 years ago. Her smoking use included cigarettes. She has a 5.00 pack-year smoking history. She has never used smokeless tobacco. - Review of Systems: Per HPI. - Past Medical History: Patient Active Problem List   Diagnosis Date Noted  . Hypokalemia 12/04/2018  . Restless leg syndrome 12/04/2018  . Osteopenia 09/15/2017  . Pain of both hip joints 06/13/2017  . Knee pain, chronic 07/21/2016  . CTS (carpal tunnel syndrome) 06/06/2016  . Estrogen deficiency 03/29/2016  . CAD (coronary artery disease), native coronary artery 10/26/2015  . Back pain 08/17/2015  . Persistent atrial fibrillation 02/23/2015  . PAC (premature atrial contraction) 01/19/2015  . Atrial tachycardia, paroxysmal (HCC) 01/19/2015  . Irregular heart rhythm 11/25/2014  . Healthcare maintenance 03/04/2014  . ASTHMA, INTERMITTENT 09/07/2010  . HERNIA, VENTRAL 04/18/2010  . 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 Diabetes mellitus type II, controlled, with no complications (HCC) 02/05/2007  .  HYPERCHOLESTEROLEMIA 02/05/2007  . Obesity 02/05/2007  . DEPRESSION, MAJOR, RECURRENT 02/05/2007  . HYPERTENSION, BENIGN SYSTEMIC 02/05/2007  . GASTROESOPHAGEAL REFLUX, NO ESOPHAGITIS 02/05/2007  . OSTEOARTHRITIS, MULTI SITES 02/05/2007   - Medications: reviewed and updated   Objective:   Physical Exam BP 122/72   Pulse 67   Temp 98.5 F (36.9 C) (Oral)   Ht 5\' 3"  (1.6 m)   Wt 229 lb 9.6 oz (104.1 kg)   SpO2 99%   BMI 40.67 kg/m  Gen: NAD, alert, cooperative with exam, well-appearing HEENT: NCAT, PERRL, clear conjunctiva, oropharynx clear, supple neck CV: RRR, good S1/S2, no murmur Resp: CTABL, no wheezes, non-labored Musculoskeletal: Mild tenderness to palpation of right paraspinal muscles, no spinous process tenderness, slightly restricted range of motion on lumbar flexion and right lateral extension Psych: good insight, alert and oriented        Assessment & Plan:   Back pain Agree with prior assessments that back pain appears to be musculoskeletal in origin.  Encouraged continued physical activity and reduction of sugary foods to help patient lose weight and relieve pressure from her back.  Gave patient a handout on back exercises as well.  Refilled patient's tramadol and prescribed baclofen to help with muscle pain.  Also recommended Tylenol and heating pads for symptom relief.  Patient is in agreement with stopping Valium due to its side effects and contraindication for patients greater than 68 years of age.  Hypokalemia Will obtain BMP today.  Restless leg syndrome Will try Requip  0.25 mg nightly to see if this helps her restless leg syndrome.    Lezlie OctaveAmanda Alucard Fearnow, M.D. 12/04/2018, 11:30 AM PGY-2, [facility]

## 2018-12-04 NOTE — Assessment & Plan Note (Addendum)
Agree with prior assessments that back pain appears to be musculoskeletal in origin.  Encouraged continued physical activity and reduction of sugary foods to help patient lose weight and relieve pressure from her back.  Gave patient a handout on back exercises as well.  Refilled patient's tramadol and prescribed baclofen to help with muscle pain.  Also recommended Tylenol and heating pads for symptom relief.  Patient is in agreement with stopping Valium due to its side effects and contraindication for patients greater than 865 years of age.

## 2018-12-05 LAB — BASIC METABOLIC PANEL
BUN / CREAT RATIO: 26 (ref 12–28)
BUN: 24 mg/dL (ref 8–27)
CO2: 23 mmol/L (ref 20–29)
CREATININE: 0.93 mg/dL (ref 0.57–1.00)
Calcium: 10.6 mg/dL — ABNORMAL HIGH (ref 8.7–10.3)
Chloride: 105 mmol/L (ref 96–106)
GFR calc Af Amer: 73 mL/min/{1.73_m2} (ref >59)
GFR, EST NON AFRICAN AMERICAN: 63 mL/min/{1.73_m2} (ref >59)
Glucose: 168 mg/dL — ABNORMAL HIGH (ref 65–99)
POTASSIUM: 4.5 mmol/L (ref 3.5–5.2)
SODIUM: 143 mmol/L (ref 134–144)

## 2018-12-07 ENCOUNTER — Telehealth: Payer: Self-pay

## 2018-12-07 NOTE — Telephone Encounter (Signed)
Yes she should    Thanks.

## 2018-12-07 NOTE — Telephone Encounter (Signed)
-----   Message from Meredith DenseAlison Rumball, DO sent at 12/07/2018  8:36 AM EST ----- Please let patient know her follow up potassium is normal. Thanks!

## 2018-12-07 NOTE — Telephone Encounter (Signed)
Spoke to pt. Gave her the information below. Pt asked if she still needs to take her medicine for potassium? Please advise. Sunday SpillersSharon T Melissia Lahman, CMA

## 2018-12-10 NOTE — Telephone Encounter (Signed)
Spoke to pt and informed her she should take her medicine for potassium. Sunday Spillers, CMA

## 2019-01-02 ENCOUNTER — Other Ambulatory Visit: Payer: Self-pay | Admitting: Cardiology

## 2019-01-18 ENCOUNTER — Other Ambulatory Visit: Payer: Self-pay | Admitting: Family Medicine

## 2019-01-19 ENCOUNTER — Other Ambulatory Visit: Payer: Self-pay | Admitting: Cardiology

## 2019-01-30 ENCOUNTER — Other Ambulatory Visit: Payer: Self-pay | Admitting: Family Medicine

## 2019-02-02 DIAGNOSIS — H52203 Unspecified astigmatism, bilateral: Secondary | ICD-10-CM | POA: Diagnosis not present

## 2019-02-02 DIAGNOSIS — H524 Presbyopia: Secondary | ICD-10-CM | POA: Diagnosis not present

## 2019-02-02 DIAGNOSIS — Z7984 Long term (current) use of oral hypoglycemic drugs: Secondary | ICD-10-CM | POA: Diagnosis not present

## 2019-02-02 DIAGNOSIS — H2513 Age-related nuclear cataract, bilateral: Secondary | ICD-10-CM | POA: Diagnosis not present

## 2019-02-02 DIAGNOSIS — E119 Type 2 diabetes mellitus without complications: Secondary | ICD-10-CM | POA: Diagnosis not present

## 2019-02-04 ENCOUNTER — Encounter: Payer: Self-pay | Admitting: Cardiology

## 2019-02-04 ENCOUNTER — Ambulatory Visit: Payer: Medicare HMO | Admitting: Cardiology

## 2019-02-04 VITALS — BP 154/93 | HR 63 | Ht 63.0 in | Wt 225.0 lb

## 2019-02-04 DIAGNOSIS — E7800 Pure hypercholesterolemia, unspecified: Secondary | ICD-10-CM | POA: Diagnosis not present

## 2019-02-04 DIAGNOSIS — I2510 Atherosclerotic heart disease of native coronary artery without angina pectoris: Secondary | ICD-10-CM

## 2019-02-04 DIAGNOSIS — I10 Essential (primary) hypertension: Secondary | ICD-10-CM | POA: Diagnosis not present

## 2019-02-04 DIAGNOSIS — I4819 Other persistent atrial fibrillation: Secondary | ICD-10-CM

## 2019-02-04 LAB — BASIC METABOLIC PANEL
BUN/Creatinine Ratio: 30 — ABNORMAL HIGH (ref 12–28)
BUN: 23 mg/dL (ref 8–27)
CO2: 22 mmol/L (ref 20–29)
Calcium: 10.2 mg/dL (ref 8.7–10.3)
Chloride: 102 mmol/L (ref 96–106)
Creatinine, Ser: 0.77 mg/dL (ref 0.57–1.00)
GFR calc Af Amer: 92 mL/min/{1.73_m2} (ref >59)
GFR calc non Af Amer: 80 mL/min/{1.73_m2} (ref >59)
Glucose: 124 mg/dL — ABNORMAL HIGH (ref 65–99)
Potassium: 4.2 mmol/L (ref 3.5–5.2)
Sodium: 140 mmol/L (ref 134–144)

## 2019-02-04 LAB — HEPATIC FUNCTION PANEL
ALT: 9 IU/L (ref 0–32)
AST: 11 IU/L (ref 0–40)
Albumin: 4.3 g/dL (ref 3.8–4.8)
Alkaline Phosphatase: 45 IU/L (ref 39–117)
Bilirubin Total: 0.3 mg/dL (ref 0.0–1.2)
Bilirubin, Direct: 0.13 mg/dL (ref 0.00–0.40)
Total Protein: 7.2 g/dL (ref 6.0–8.5)

## 2019-02-04 LAB — LIPID PANEL
Chol/HDL Ratio: 2.6 ratio (ref 0.0–4.4)
Cholesterol, Total: 142 mg/dL (ref 100–199)
HDL: 55 mg/dL (ref >39)
LDL Calculated: 72 mg/dL (ref 0–99)
Triglycerides: 77 mg/dL (ref 0–149)
VLDL Cholesterol Cal: 15 mg/dL (ref 5–40)

## 2019-02-04 NOTE — Progress Notes (Signed)
Cardiology Office Note:    Date:  02/04/2019   ID:  Meredith [name], DOB 25-Jul-1951, MRN [id number]  PCP:  Kathrene Alu, MD  Cardiologist:  Fransico Him, MD    Referring MD: Kathrene Alu, MD   Chief Complaint  Patient presents with  . Atrial Fibrillation  . Coronary Artery Disease  . Hyperlipidemia  . Hypertension    History of Present Illness:    Meredith [name] is a 68 y.o. female with a hx of atrial tachycardia,persistentAF on Xarelto, nonobstructive ASCAD with 20% LCx and RCA by cath, HTN and dyslipidemia.  He is here today for followup and is doing well.  He denies any chest pain or pressure, SOB, DOE, PND, orthopnea, LE edema, dizziness, palpitations or syncope. He is compliant with his meds and is tolerating meds with no SE.    Past Medical History:  Diagnosis Date  . ANEMIA, IRON DEFICIENCY, UNSPEC. 02/05/2007   Qualifier: Diagnosis of  By: Damita Dunnings MD, Phillip Heal    . CAD (coronary artery disease), native coronary artery 02/16/2015   Cath with 20% LCx and RCA  . Colon polyps   . Diabetes mellitus   . Diverticulosis 05/17/12  . DJD (degenerative joint disease) of lumbar spine   . Enteritis   . Gastric ulcer 04/2000  . Hyperlipidemia   . Hypertension   . Irregular heart beat   . Obesity   . Osteoarthritis   . Persistent atrial fibrillation    CHADS2VASC score is 5 and on Xarelto  . Renal cyst, left 05/17/12  . Ventral hernia     Past Surgical History:  Procedure Laterality Date  . ABDOMINAL HYSTERECTOMY    . LEFT HEART CATHETERIZATION WITH CORONARY ANGIOGRAM N/A 02/16/2015   Procedure: LEFT HEART CATHETERIZATION WITH CORONARY ANGIOGRAM;  Surgeon: Burnell Blanks, MD;  Location: [facility] CATH LAB;  Service: Cardiovascular;  Laterality: N/A;  . OPERATIVE HYSTEROSCOPY    . TUBAL LIGATION    . UTERINE FIBROID SURGERY      Current Medications: Current Meds  Medication Sig  . ACCU-CHEK SOFTCLIX LANCETS lancets Use as instructed to test blood glucose once  daily. ICD-10 code: E11.9  . acetaminophen (ACETAMINOPHEN 8 HOUR) 650 MG CR tablet Take 1 tablet (650 mg total) by mouth every 8 (eight) hours.  Marland Kitchen amLODipine (NORVASC) 10 MG tablet TAKE 1 TABLET BY MOUTH EVERY DAY  . baclofen (LIORESAL) 10 MG tablet Take 1 tablet (10 mg total) by mouth 3 (three) times daily as needed for muscle spasms.  . Blood Glucose Monitoring Suppl (ACCU-CHEK AVIVA PLUS) w/Device KIT 1 kit by Does not apply route as directed. ICD-10 code: E11.9  . diazepam (VALIUM) 5 MG tablet Take 0.5 tablets (2.5 mg total) by mouth every 8 (eight) hours as needed for anxiety or muscle spasms (spasms).  . ezetimibe (ZETIA) 10 MG tablet TAKE 1 TABLET BY MOUTH EVERY DAY  . glucose blood (ACCU-CHEK AVIVA PLUS) test strip Use as instructed test blood glucose once daily. ICD-10 code: E11.9  . hydrochlorothiazide (HYDRODIURIL) 25 MG tablet TAKE 1 TABLET BY MOUTH EVERY DAY  . Lancets Misc. (ACCU-CHEK SOFTCLIX LANCET DEV) KIT Use as directed to test once daily. ICD-10 code: E11.9  . lidocaine (LIDODERM) 5 % Place 1 patch onto the skin daily. Remove & Discard patch within 12 hours or as directed by MD  . metFORMIN (GLUCOPHAGE) 1000 MG tablet TAKE 1 TABLET BY MOUTH 2 TIMES DAILY WITH A MEAL.  . metoprolol succinate (TOPROL-XL) 50 MG 24  hr tablet Take 1 tablet (50 mg total) by mouth daily. Take with or immediately following a meal.  . potassium chloride (K-DUR) 10 MEQ tablet TAKE 2 TABLETS BY MOUTH TWICE A DAY  . rivaroxaban (XARELTO) 20 MG TABS tablet TAKE 1 TABLET BY MOUTH EVERY DAY WITH SUPPER  . rOPINIRole (REQUIP) 0.25 MG tablet Take 1 tablet (0.25 mg total) by mouth 3 (three) times daily.  . rosuvastatin (CRESTOR) 20 MG tablet TAKE 1 TABLET BY MOUTH EVERY DAY  . traMADol (ULTRAM) 50 MG tablet Take 1 tablet (50 mg total) by mouth every 8 (eight) hours as needed.     Allergies:   Lisinopril; Doxycycline; Flexeril [cyclobenzaprine hcl]; Lipitor [atorvastatin calcium]; and Metaxalone   Social  History   Socioeconomic History  . Marital status: Divorced    Spouse name: Not on file  . Number of children: 4  . Years of education: Not on file  . Highest education level: Not on file  Occupational History  . Occupation: Airline pilot: [employer]  . Financial resource strain: Not on file  . Food insecurity:    Worry: Not on file    Inability: Not on file  . Transportation needs:    Medical: Not on file    Non-medical: Not on file  Tobacco Use  . Smoking status: Former Smoker    Packs/day: 1.00    Years: 5.00    Pack years: 5.00    Types: Cigarettes    Last attempt to quit: 12/09/1978    Years since quitting: 40.1  . Smokeless tobacco: Never Used  Substance and Sexual Activity  . Alcohol use: No  . Drug use: No  . Sexual activity: Not on file  Lifestyle  . Physical activity:    Days per week: Not on file    Minutes per session: Not on file  . Stress: Not on file  Relationships  . Social connections:    Talks on phone: Not on file    Gets together: Not on file    Attends religious service: Not on file    Active member of club or organization: Not on file    Attends meetings of clubs or organizations: Not on file    Relationship status: Not on file  Other Topics Concern  . 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 Silvano Rusk, RN, BSN  Family History: The patient's family history  includes Cancer in her father and sister; Clotting disorder in an other family member; Diabetes in her mother; Heart disease in her father and mother; Kidney disease in her mother; Lung cancer in her father; Stomach cancer in her sister. There is no history of Colon cancer.  ROS:   Please see the history of present illness.    ROS  All other systems reviewed and negative.   EKGs/Labs/Other Studies Reviewed:    The following studies were reviewed today: none  EKG:  EKG is  ordered today and showed normal sinus rhythm with first-degree AV block septal infarct.  No ST changes.  Recent Labs: 04/30/2018: ALT 17; Platelets 173 10/22/2018: Hemoglobin 12.9 12/04/2018: BUN 24; Creatinine, Ser 0.93; Potassium 4.5; Sodium 143   Recent Lipid Panel    Component Value Date/Time   CHOL 130 01/08/2018 0902   TRIG 96 01/08/2018 0902   HDL 47 01/08/2018 0902   CHOLHDL 2.8 01/08/2018 0902   CHOLHDL 2.3 05/16/2016 0739   VLDL 17 05/16/2016 0739   LDLCALC 64 01/08/2018 0902   LDLDIRECT 130 (H) 05/13/2012 0917    Physical Exam:    VS:  BP (!) 154/93   Pulse 63   Ht '5\' 3"'  (1.6 m)   Wt 225 lb (102.1 kg)   BMI 39.86 kg/m     Wt Readings from Last 3 Encounters:  02/04/19 225 lb (102.1 kg)  12/04/18 229 lb 9.6 oz (104.1 kg)  10/15/18 236 lb (107 kg)     GEN:  Well nourished, well developed in no acute distress HEENT: Normal NECK: No JVD; No carotid bruits LYMPHATICS: No lymphadenopathy CARDIAC: RRR, no murmurs, rubs, gallops RESPIRATORY:  Clear to auscultation without rales, wheezing or rhonchi  ABDOMEN: Soft, non-tender, non-distended MUSCULOSKELETAL:  No edema; No deformity  SKIN: Warm and dry NEUROLOGIC:  Alert and oriented x 3 PSYCHIATRIC:  Normal affect   ASSESSMENT:    1. Persistent atrial fibrillation   2. HYPERTENSION, BENIGN SYSTEMIC   3. Coronary artery disease involving native coronary artery of native heart without angina pectoris   4. HYPERCHOLESTEROLEMIA    PLAN:      In order of problems listed above:  1.  Persistent atrial fibrillation -she has maintain normal sinus rhythm on exam.  She will continue on Toprol-XL 50 mg daily and Xarelto 20 mg daily.  Her creatinine was stable at 0.93 and potassium 4.5 on 12/04/2018.  2.  HTN - BP is borderline controlled today on exam.  She thinks her blood pressure is elevated today because she is in the office.  She will continue on amlodipine 10 mg daily, Toprol-XL 50 mg daily and HCTZ 25 mg daily.  I am going to order a 24-hour blood pressure cuff to monitor her blood pressure to see if we need to make an additional change in her blood pressure medicines.  3.  ASCAD - nonobstructive ASCAD with 20% LCx and RCA by cath.  She denies any anginal symptoms.  She will continue on beta-blocker and statin.  She is not on aspirin due to Xarelto.  Hemoglobin was stable at 12.9 10/22/2018.  4.  Hyperlipidemia - LDL goal is less than 70.  LDL was stable at 64 a year ago.  I will repeat an FLP and ALT.  She will continue on Zetia 10 mg daily and Crestor 20 mg daily.   Medication Adjustments/Labs and Tests Ordered: Current medicines are reviewed at length with the patient today.  Concerns regarding medicines are outlined above.  No orders of the defined types were placed in this encounter.  No orders of the defined types were placed in this encounter.   Signed, Fransico Him, MD  02/04/2019 9:12 AM    [place]

## 2019-02-04 NOTE — Patient Instructions (Signed)
Medication Instructions:  Your physician recommends that you continue on your current medications as directed. Please refer to the Current Medication list given to you today.  If you need a refill on your cardiac medications before your next appointment, please call your pharmacy.   Lab work: Today: BMET, LFT and Lipid  If you have labs (blood work) drawn today and your tests are completely normal, you will receive your results only by: Marland Kitchen MyChart Message (if you have MyChart) OR . A paper copy in the mail If you have any lab test that is abnormal or we need to change your treatment, we will call you to review the results.  Testing/Procedures: Your physician has recommended that you wear a 24 hour blood pressure  monitor.   Follow-Up: At [facility], you and your health needs are our priority.  As part of our continuing mission to provide you with exceptional heart care, we have created designated Provider Care Teams.  These Care Teams include your primary Cardiologist (physician) and Advanced Practice Providers (APPs -  Physician Assistants and Nurse Practitioners) who all work together to provide you with the care you need, when you need it. You will need a follow up appointment in 6 months.  Please call our office 2 months in advance to schedule this appointment.  You may see Armanda Magic, MD or one of the following Advanced Practice Providers on your designated Care Team:   [place], PA-C Ronie Spies, PA-C . Jacolyn Reedy, PA-C

## 2019-02-18 ENCOUNTER — Ambulatory Visit: Payer: Medicare HMO

## 2019-02-18 ENCOUNTER — Other Ambulatory Visit: Payer: Self-pay

## 2019-02-18 DIAGNOSIS — I10 Essential (primary) hypertension: Secondary | ICD-10-CM

## 2019-02-19 ENCOUNTER — Telehealth: Payer: Self-pay | Admitting: Cardiology

## 2019-02-19 NOTE — Telephone Encounter (Signed)
Spoke with the patient, advised her to drop the monitor at the office front desk. She had no further questions.

## 2019-02-19 NOTE — Telephone Encounter (Signed)
New message   Patient states that she had a blood pressure monitor put on on yesterday. Patient wants to know when she can return this monitor?

## 2019-03-04 ENCOUNTER — Telehealth: Payer: Self-pay | Admitting: *Deleted

## 2019-03-04 NOTE — Telephone Encounter (Signed)
Left voicemail telling patient that our clinic is trying to reduce the number of patients who could possibly be exposed to the virus by coming into our clinic, so I will be happy to call her on Tuesday to conduct a phone visit if she would like, or we can wait until the coronavirus pandemic calms down and schedule a physical appointment at that time.  Asked patient to call back if she would like for me to call her on Tuesday.

## 2019-03-04 NOTE — Telephone Encounter (Signed)
Pt wants to know when she needs to follow up for next appt.  Will forward to MD to check on telemedicine. Jone Baseman, CMA

## 2019-03-24 ENCOUNTER — Other Ambulatory Visit: Payer: Self-pay | Admitting: Cardiology

## 2019-03-31 ENCOUNTER — Other Ambulatory Visit: Payer: Self-pay | Admitting: Cardiology

## 2019-04-25 ENCOUNTER — Other Ambulatory Visit: Payer: Self-pay | Admitting: Family Medicine

## 2019-04-25 ENCOUNTER — Other Ambulatory Visit: Payer: Self-pay | Admitting: Cardiology

## 2019-06-17 ENCOUNTER — Telehealth: Payer: Self-pay

## 2019-06-17 ENCOUNTER — Other Ambulatory Visit: Payer: Self-pay

## 2019-06-17 ENCOUNTER — Telehealth: Payer: Self-pay | Admitting: General Practice

## 2019-06-17 ENCOUNTER — Telehealth (INDEPENDENT_AMBULATORY_CARE_PROVIDER_SITE_OTHER): Payer: Medicare HMO | Admitting: Family Medicine

## 2019-06-17 DIAGNOSIS — Z20822 Contact with and (suspected) exposure to covid-19: Secondary | ICD-10-CM

## 2019-06-17 DIAGNOSIS — Z20828 Contact with and (suspected) exposure to other viral communicable diseases: Secondary | ICD-10-CM

## 2019-06-17 NOTE — Assessment & Plan Note (Addendum)
Patient with recent exposure to COVID positive relative. Currently asymptomatic. COVID test referral sent. Advised of safety precautions in having the family member with COVID to isolate from other family, frequent hand washing, disinfecting high-touch areas of the home, and to quarantine until result of test. If test positive she will need to continue above for 14 days after last symptoms or day of test result whichever applies. Work note provided. Return precautions discussed including worsening SOB or difficulty breathing. Patient understood and agreed to plan.

## 2019-06-17 NOTE — Telephone Encounter (Signed)
Pt came and picked up letter from Dr. Tarry Kos. Pt had questions as to why she needed to be out of work before she was tested for [place]. I explained that she has been around someone who tested positive and could have COVID and pass it on without knowing. So to be safe she needs to stay home until she gets the results from the test. Ottis Stain, [place]

## 2019-06-17 NOTE — Telephone Encounter (Signed)
Pt has been scheduled for covid testing  Pt was referred by: Danna Hefty, DO

## 2019-06-17 NOTE — Telephone Encounter (Signed)
Called patient to make sure that she had gotten letter for work.  Patient states that she has.  Meredith [name], [place]

## 2019-06-17 NOTE — Progress Notes (Signed)
[facility] Telemedicine Visit  Patient consented to have virtual visit. Method of visit: Telephone  Encounter participants: Patient: GAL SMOLINSKI - located in car at shopping center Provider: Danna Hefty - located at home Others (if applicable): none  Chief Complaint: known covid exposure  HPI: Patient is out at shopping center. She is calling for a covid test. Her sister was recently diagnosed with COVID last week and she was exposed to her on 6/27. Denies any fevers, chills, congestion, cough, changes in smell/taste, nausea, vomiting, or SOB/trouble breathing.   Patient is very worried about her job and losing it. She goes back to work Monday.   ROS: per HPI  Pertinent PMHx: HTN, a. Fib, DM, CAD  Exam:  Respiratory: speaking in full sentences  Assessment/Plan:  Close Exposure to Covid-19 Virus Patient with recent exposure to COVID positive relative. Currently asymptomatic. COVID test referral sent. Advised of safety precautions in having the family member with COVID to isolate from other family, frequent hand washing, disinfecting high-touch areas of the home, and to quarantine until result of test. If test positive she will need to continue above for 14 days after last symptoms. Work note provided. Return precautions discussed including worsening SOB or difficulty breathing. Patient understood and agreed to plan.      Time spent during visit with patient: 15 minutes  Mina Marble, [place], PGY2

## 2019-06-17 NOTE — Addendum Note (Signed)
Addended by: Dimple Nanas on: 06/17/2019 03:40 PM   Modules accepted: Orders

## 2019-06-18 ENCOUNTER — Other Ambulatory Visit: Payer: Medicare HMO

## 2019-06-18 DIAGNOSIS — Z20822 Contact with and (suspected) exposure to covid-19: Secondary | ICD-10-CM

## 2019-06-18 DIAGNOSIS — R6889 Other general symptoms and signs: Secondary | ICD-10-CM | POA: Diagnosis not present

## 2019-06-22 LAB — NOVEL CORONAVIRUS, NAA: SARS-CoV-2, NAA: NOT DETECTED

## 2019-06-23 ENCOUNTER — Other Ambulatory Visit: Payer: Self-pay | Admitting: Cardiology

## 2019-07-08 ENCOUNTER — Other Ambulatory Visit: Payer: Self-pay

## 2019-07-08 MED ORDER — METFORMIN HCL 1000 MG PO TABS: ORAL_TABLET | ORAL | 1 refills | Status: DC

## 2019-07-13 ENCOUNTER — Other Ambulatory Visit: Payer: Self-pay | Admitting: Cardiology

## 2019-07-13 NOTE — Telephone Encounter (Signed)
Pt aware covid lab test negative, not detected °

## 2019-07-20 ENCOUNTER — Other Ambulatory Visit: Payer: Self-pay | Admitting: *Deleted

## 2019-07-20 MED ORDER — METOPROLOL SUCCINATE ER 50 MG PO TB24: 50.0000 mg | ORAL_TABLET | Freq: Every day | ORAL | 0 refills | Status: DC

## 2019-07-21 ENCOUNTER — Other Ambulatory Visit: Payer: Self-pay | Admitting: Family Medicine

## 2019-07-23 ENCOUNTER — Other Ambulatory Visit: Payer: Self-pay | Admitting: Family Medicine

## 2019-07-23 DIAGNOSIS — Z1231 Encounter for screening mammogram for malignant neoplasm of breast: Secondary | ICD-10-CM

## 2019-08-27 ENCOUNTER — Ambulatory Visit (INDEPENDENT_AMBULATORY_CARE_PROVIDER_SITE_OTHER): Payer: Medicare HMO | Admitting: Family Medicine

## 2019-08-27 ENCOUNTER — Other Ambulatory Visit: Payer: Self-pay

## 2019-08-27 ENCOUNTER — Encounter: Payer: Self-pay | Admitting: Family Medicine

## 2019-08-27 VITALS — BP 138/80 | HR 77 | Temp 98.4°F | Wt 236.0 lb

## 2019-08-27 DIAGNOSIS — Z6841 Body Mass Index (BMI) 40.0 and over, adult: Secondary | ICD-10-CM | POA: Diagnosis not present

## 2019-08-27 DIAGNOSIS — M25561 Pain in right knee: Secondary | ICD-10-CM | POA: Diagnosis not present

## 2019-08-27 DIAGNOSIS — Z0000 Encounter for general adult medical examination without abnormal findings: Secondary | ICD-10-CM | POA: Diagnosis not present

## 2019-08-27 DIAGNOSIS — M25562 Pain in left knee: Secondary | ICD-10-CM | POA: Diagnosis not present

## 2019-08-27 DIAGNOSIS — G8929 Other chronic pain: Secondary | ICD-10-CM

## 2019-08-27 DIAGNOSIS — E119 Type 2 diabetes mellitus without complications: Secondary | ICD-10-CM

## 2019-08-27 LAB — POCT GLYCOSYLATED HEMOGLOBIN (HGB A1C): HbA1c, POC (controlled diabetic range): 7.3 % — AB (ref 0.0–7.0)

## 2019-08-27 MED ORDER — DICLOFENAC SODIUM 1 % TD GEL: 2.0000 g | Freq: Four times a day (QID) | TRANSDERMAL | 0 refills | Status: DC

## 2019-08-27 NOTE — Assessment & Plan Note (Signed)
Patient offered Tdap and pneumonia vaccine, she declines all health care maintenance and wants to focus on her knee pain right now.

## 2019-08-27 NOTE — Patient Instructions (Signed)
It was a pleasure to see you today! Thank you for choosing [facility] for your primary care. Meredith [name] was seen for knee pain. Come back to the clinic in 3 months to see Dr. Shan Levans about diabetes.  Today we talked about your chronic knee pain, you do not want injections or considering for surgery.  He said that you can work nutrition on your own do not need referral for that, we are going to offer you some Voltaren gel which might help relieve some of the pain.  We suggest you continue walking and working on weight control with your diet.  Your A1c check came back significantly better than the last time so we are very happy that your diabetes is better controlled you been doing a good job.    Please bring all your medications to every doctors visit   Sign up for My Chart to have easy access to your labs results, and communication with your Primary care physician.     Please check-out at the front desk before leaving the clinic.     Best,  Dr. Sherene Sires FAMILY MEDICINE RESIDENT - PGY3 08/27/2019 8:48 AM

## 2019-08-27 NOTE — Assessment & Plan Note (Signed)
Patient says tramadol works minimally, Tylenol works pretty well.  We discussed potential duloxetine which she decided she would not want and would prefer to use Voltaren gel.  She declines nutrition consult, injections and, surgery  Significant osteoarthritis, worsening in pain response.  Patient declined surgery and opts for conservative management at this time, we will add Voltaren gel to her current Tylenol and tramadol.

## 2019-08-27 NOTE — Progress Notes (Signed)
    Subjective:  Meredith [name] is a 68 y.o. female who presents to the [facility] today with a chief complaint of chronic knee pain.   HPI: Knee pain, chronic Patient says tramadol works minimally, Tylenol works pretty well.  We discussed potential duloxetine which she decided she would not want and would prefer to use Voltaren gel.  She does walk daily and says that she does her home therapy on her own.  Diabetes mellitus type II, controlled, with no complications (HCC) O8C down to 7.3 from 8.7 at last check.  Patient attributes this to walking and cutting out fried foods, she was not confident that her A1c would be down because her weight has been up.  Healthcare maintenance Patient offered Tdap and pneumonia vaccine, she declines all health care maintenance and wants to focus on her knee pain right now.  Objective:  Physical Exam: BP 138/80   Pulse 77   Temp 98.4 F (36.9 C) (Oral)   Wt 236 lb (107 kg)   SpO2 97%   BMI 41.81 kg/m   Gen: NAD, conversing comfortably CV: Regular rate Pulm: No increased work of breathing, regular respiratory rate.  No cough, no stridor MSK: Significant crepitus to both knees on flexion-extension, no bony tenderness or erythema. Skin: warm, dry Neuro: grossly normal, moves all extremities Psych: Normal affect and thought content  Results for orders placed or performed in visit on 08/27/19 (from the past 72 hour(s))  HgB A1c     Status: Abnormal   Collection Time: 08/27/19  8:30 AM  Result Value Ref Range   Hemoglobin A1C     HbA1c POC (<> result, manual entry)     HbA1c, POC (prediabetic range)     HbA1c, POC (controlled diabetic range) 7.3 (A) 0.0 - 7.0 %     Assessment/Plan:  Knee pain, chronic Patient says tramadol works minimally, Tylenol works pretty well.  We discussed potential duloxetine which she decided she would not want and would prefer to use Voltaren gel.  She declines nutrition consult, injections and, surgery  Significant  osteoarthritis, worsening in pain response.  Patient declined surgery and opts for conservative management at this time, we will add Voltaren gel to her current Tylenol and tramadol.  Diabetes mellitus type II, controlled, with no complications ([place]) Improving diabetic control.  A1c down to 7.3 from 8.7 at last check.  Patient attributes this to walking and cutting out fried foods, will continue that no new medication changes at this time.  Healthcare maintenance Patient offered Tdap and pneumonia vaccine, she declines all health care maintenance and wants to focus on her knee pain right now.   Sherene Sires, DO FAMILY MEDICINE RESIDENT - PGY3 08/27/2019 8:53 AM

## 2019-08-27 NOTE — Assessment & Plan Note (Signed)
Improving diabetic control.  A1c down to 7.3 from 8.7 at last check.  Patient attributes this to walking and cutting out fried foods, will continue that no new medication changes at this time.

## 2019-09-09 ENCOUNTER — Other Ambulatory Visit: Payer: Self-pay

## 2019-09-09 ENCOUNTER — Ambulatory Visit
Admission: RE | Admit: 2019-09-09 | Discharge: 2019-09-09 | Disposition: A | Discharge status: Home / Self Care | Payer: Medicare HMO | Source: Ambulatory Visit | Attending: Family Medicine | Admitting: Family Medicine

## 2019-09-09 DIAGNOSIS — Z1231 Encounter for screening mammogram for malignant neoplasm of breast: Secondary | ICD-10-CM | POA: Diagnosis not present

## 2019-09-28 ENCOUNTER — Other Ambulatory Visit: Payer: Self-pay | Admitting: *Deleted

## 2019-09-28 MED ORDER — RIVAROXABAN 20 MG PO TABS: ORAL_TABLET | ORAL | 3 refills | Status: DC

## 2019-10-11 NOTE — Progress Notes (Signed)
Virtual Visit via Telephone Note   This visit type was conducted due to national recommendations for restrictions regarding the COVID-19 Pandemic (e.g. social distancing) in an effort to limit this patient's exposure and mitigate transmission in our community.  Due to her co-morbid illnesses, this patient is at least at moderate risk for complications without adequate follow up.  This format is felt to be most appropriate for this patient at this time.  All issues noted in this document were discussed and addressed.  A limited physical exam was performed with this format.  Please refer to the patient's chart for her consent to telehealth for [facility]   Evaluation Performed:  Follow-up visit  This visit type was conducted due to national recommendations for restrictions regarding the COVID-19 Pandemic (e.g. social distancing).  This format is felt to be most appropriate for this patient at this time.  All issues noted in this document were discussed and addressed.  No physical exam was performed (except for noted visual exam findings with Video Visits).  Please refer to the patient's chart (MyChart message for video visits and phone note for telephone visits) for the patient's consent to telehealth for [facility].  Date:  10/14/2019   ID:  Meredith [name], DOB 05/04/1951, MRN [id number]  Patient Location:  Home  Provider location:   [place]  PCP:  Kathrene Alu, MD  Cardiologist:  Fransico Him, MD  Electrophysiologist:  None   Chief Complaint:  Afib, CAD, HLD, HTN  History of Present Illness:    Meredith [name] is a 68 y.o. female who presents via audio/video conferencing for a telehealth visit today.    Meredith [name] is a 68 y.o. female with a hx of atrial tachycardia,persistentAF on Xarelto, nonobstructive ASCAD with 20% LCx and RCA by cath, HTN and dyslipidemia.  She is here today for followup and is doing well.  She denies any chest pain or pressure, SOB, DOE,  PND, orthopnea, LE edema, dizziness, palpitations or syncope. She is compliant with her meds and is tolerating meds with no SE.    The patient does not have symptoms concerning for COVID-19 infection (fever, chills, cough, or new shortness of breath).   Prior CV studies:   The following studies were reviewed today:  none  Past Medical History:  Diagnosis Date  . ANEMIA, IRON DEFICIENCY, UNSPEC. 02/05/2007   Qualifier: Diagnosis of  By: Damita Dunnings MD, Phillip Heal    . CAD (coronary artery disease), native coronary artery 02/16/2015   Cath with 20% LCx and RCA  . Colon polyps   . Diabetes mellitus   . Diverticulosis 05/17/12  . DJD (degenerative joint disease) of lumbar spine   . Enteritis   . Gastric ulcer 04/2000  . Hyperlipidemia   . Hypertension   . Irregular heart beat   . Obesity   . Osteoarthritis   . Persistent atrial fibrillation (HCC)    CHADS2VASC score is 5 and on Xarelto  . Renal cyst, left 05/17/12  . Ventral hernia    Past Surgical History:  Procedure Laterality Date  . ABDOMINAL HYSTERECTOMY    . LEFT HEART CATHETERIZATION WITH CORONARY ANGIOGRAM N/A 02/16/2015   Procedure: LEFT HEART CATHETERIZATION WITH CORONARY ANGIOGRAM;  Surgeon: Burnell Blanks, MD;  Location: [facility] C[facility] LAB;  Service: Cardiovascular;  Laterality: N/A;  . OPERATIVE HYSTEROSCOPY    . TUBAL LIGATION    . UTERINE FIBROID SURGERY       Current Meds  Medication Sig  .  ACCU-CHEK AVIVA PLUS test strip USE AS INSTRUCTED TEST BLOOD GLUCOSE ONCE DAILY. ICD-10 CODE: E11.9  . ACCU-CHEK SOFTCLIX LANCETS lancets Use as instructed to test blood glucose once daily. ICD-10 code: E11.9  . acetaminophen (ACETAMINOPHEN 8 HOUR) 650 MG CR tablet Take 1 tablet (650 mg total) by mouth every 8 (eight) hours.  Marland Kitchen amLODipine (NORVASC) 10 MG tablet TAKE 1 TABLET BY MOUTH EVERY DAY  . baclofen (LIORESAL) 10 MG tablet Take 1 tablet (10 mg total) by mouth 3 (three) times daily as needed for muscle spasms.  . Blood Glucose  Monitoring Suppl (ACCU-CHEK AVIVA PLUS) w/Device KIT 1 kit by Does not apply route as directed. ICD-10 code: E11.9  . diazepam (VALIUM) 5 MG tablet Take 0.5 tablets (2.5 mg total) by mouth every 8 (eight) hours as needed for anxiety or muscle spasms (spasms).  . diclofenac sodium (VOLTAREN) 1 % GEL Apply 2 g topically 4 (four) times daily.  Marland Kitchen ezetimibe (ZETIA) 10 MG tablet Take 1 tablet (10 mg total) by mouth daily.  . hydrochlorothiazide (HYDRODIURIL) 25 MG tablet TAKE 1 TABLET BY MOUTH EVERY DAY  . Lancets Misc. (ACCU-CHEK SOFTCLIX LANCET DEV) KIT Use as directed to test once daily. ICD-10 code: E11.9  . metFORMIN (GLUCOPHAGE) 1000 MG tablet TAKE 1 TABLET BY MOUTH 2 TIMES DAILY WITH A MEAL.  . metoprolol succinate (TOPROL-XL) 50 MG 24 hr tablet Take 1 tablet (50 mg total) by mouth daily. Take with or immediately following a meal.  . potassium chloride (K-DUR) 10 MEQ tablet TAKE 2 TABLETS BY MOUTH TWICE A DAY  . rivaroxaban (XARELTO) 20 MG TABS tablet TAKE 1 TABLET BY MOUTH EVERY DAY WITH SUPPER  . rosuvastatin (CRESTOR) 20 MG tablet TAKE 1 TABLET BY MOUTH EVERY DAY  . traMADol (ULTRAM) 50 MG tablet Take 1 tablet (50 mg total) by mouth every 8 (eight) hours as needed.     Allergies:   Lisinopril, Doxycycline, Flexeril [cyclobenzaprine hcl], Lipitor [atorvastatin calcium], and Metaxalone   Social History   Tobacco Use  . Smoking status: Former Smoker    Packs/day: 1.00    Years: 5.00    Pack years: 5.00    Types: Cigarettes    Quit date: 12/09/1978    Years since quitting: 40.8  . Smokeless tobacco: Never Used  Substance Use Topics  . Alcohol use: No  . Drug use: No     Family Hx: The patient's family history includes Cancer in her father and sister; Clotting disorder in an other family member; Diabetes in her mother; Heart disease in her father and mother; Kidney disease in her mother; Lung cancer in her father; Stomach cancer in her sister. There is no history of Colon cancer.   ROS:   Please see the history of present illness.    none All other systems reviewed and are negative.   Labs/Other Tests and Data Reviewed:    Recent Labs: 10/22/2018: Hemoglobin 12.9 02/04/2019: ALT 9; BUN 23; Creatinine, Ser 0.77; Potassium 4.2; Sodium 140   Recent Lipid Panel Lab Results  Component Value Date/Time   CHOL 142 02/04/2019 09:44 AM   TRIG 77 02/04/2019 09:44 AM   HDL 55 02/04/2019 09:44 AM   CHOLHDL 2.6 02/04/2019 09:44 AM   CHOLHDL 2.3 05/16/2016 07:39 AM   LDLCALC 72 02/04/2019 09:44 AM   LDLDIRECT 130 (H) 05/13/2012 09:17 AM    Wt Readings from Last 3 Encounters:  10/14/19 229 lb (103.9 kg)  08/27/19 236 lb (107 kg)  02/04/19 225 lb (  102.1 kg)     Objective:    Vital Signs:  Ht 5' 2" (1.575 m)   Wt 229 lb (103.9 kg)   BMI 41.88 kg/m     ASSESSMENT & PLAN:    1.  Persistent atrial fibrillation -denies any palpitations -no bleeding problems on DOAC -continue Toprol and Xarelto 20mg daily -check BMET and Hbg  2.  HTN -BP controlled -continue Toprol XL 50mg daily, amlodipine 10mg daily and HCTZ 25mg daily  3.  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 -she denies any anginal sx -continue BB and statin -no ASA due to DOAC  4.  HLD -LDL goal < 70 -check FLp and ALT -continue Crestor 20mg daily  COVID-19 Education: The signs and symptoms of COVID-19 were discussed with the patient and how to seek care for testing (follow up with PCP or arrange E-visit).  The importance of social distancing was discussed today.  Patient Risk:   After full review of this patient's clinical status, I feel that they are at least moderate risk at this time.  Time:   Today, I have spent 20 minutes directly with the patient on telemedicine discussing medical problems including CAD, HTN, Afib, HLD.  We also reviewed the symptoms of COVID 19 and the ways to protect against contracting the virus with telehealth technology.  I spent an additional 5  minutes reviewing patient's chart including labs.  Medication Adjustments/Labs and Tests Ordered: Current medicines are reviewed at length with the patient today.  Concerns regarding medicines are outlined above.  Tests Ordered: No orders of the defined types were placed in this encounter.  Medication Changes: No orders of the defined types were placed in this encounter.   Disposition:  Follow up in 1 year(s)  Signed, Traci Turner, MD  10/14/2019 9:46 AM    [facility] 

## 2019-10-14 ENCOUNTER — Other Ambulatory Visit: Payer: Self-pay

## 2019-10-14 ENCOUNTER — Telehealth (INDEPENDENT_AMBULATORY_CARE_PROVIDER_SITE_OTHER): Payer: Medicare HMO | Admitting: Cardiology

## 2019-10-14 ENCOUNTER — Telehealth: Payer: Self-pay | Admitting: *Deleted

## 2019-10-14 VITALS — Ht 62.0 in | Wt 229.0 lb

## 2019-10-14 DIAGNOSIS — I10 Essential (primary) hypertension: Secondary | ICD-10-CM | POA: Diagnosis not present

## 2019-10-14 DIAGNOSIS — I2510 Atherosclerotic heart disease of native coronary artery without angina pectoris: Secondary | ICD-10-CM | POA: Diagnosis not present

## 2019-10-14 DIAGNOSIS — I4819 Other persistent atrial fibrillation: Secondary | ICD-10-CM | POA: Diagnosis not present

## 2019-10-14 DIAGNOSIS — E7800 Pure hypercholesterolemia, unspecified: Secondary | ICD-10-CM

## 2019-10-14 NOTE — Addendum Note (Signed)
Addended by: Thompson Grayer on: 10/14/2019 10:02 AM   Modules accepted: Orders

## 2019-10-14 NOTE — Telephone Encounter (Signed)
Virtual Visit Pre-Appointment Phone Call  "(Name), I am calling you today to discuss your upcoming appointment. We are currently trying to limit exposure to the virus that causes COVID-19 by seeing patients at home rather than in the office."  1. "What is the BEST phone number to call the day of the visit?" - include this in appointment notes--[phone]  2. "Do you have or have access to (through a family member/friend) a smartphone with video capability that we can use for your visit?" no a. If yes - list this number in appt notes as "cell" (if different from BEST phone #) and list the appointment type as a VIDEO visit in appointment notes b. If no - list the appointment type as a PHONE visit in appointment notes  3. Confirm consent - "In the setting of the current Covid19 crisis, you are scheduled for a phone visit with your provider on November 5,2020  Just as we do with many in-office visits, in order for you to participate in this visit, we must obtain consent.  If you'd like, I can send this to your mychart (if signed up) or email for you to review.  Otherwise, I can obtain your verbal consent now.  All virtual visits are billed to your insurance company just like a normal visit would be.  By agreeing to a virtual visit, we'd like you to understand that the technology does not allow for your provider to perform an examination, and thus may limit your provider's ability to fully assess your condition. If your provider identifies any concerns that need to be evaluated in person, we will make arrangements to do so.  Finally, though the technology is pretty good, we cannot assure that it will always work on either your or our end, and in the setting of a video visit, we may have to convert it to a phone-only visit.  In either situation, we cannot ensure that we have a secure connection.  Are you willing to proceed?" STAFF: Did the patient verbally acknowledge consent to telehealth visit?  Document YES/NO here: yes  4. Advise patient to be prepared - "Two hours prior to your appointment, go ahead and check your blood pressure, pulse, oxygen saturation, and your weight (if you have the equipment to check those) and write them all down. When your visit starts, your provider will ask you for this information. If you have an Apple Watch or Kardia device, please plan to have heart rate information ready on the day of your appointment. Please have a pen and paper handy nearby the day of the visit as well."  5. Give patient instructions for MyChart download to smartphone OR Doximity/Doxy.me as below if video visit (depending on what platform provider is using)  6. Inform patient they will receive a phone call 15 minutes prior to their appointment time (may be from unknown caller ID) so they should be prepared to answer    TELEPHONE CALL NOTE  Meredith [name] has been deemed a candidate for a follow-up tele-health visit to limit community exposure during the Covid-19 pandemic. I spoke with the patient via phone to ensure availability of phone/video source, confirm preferred email & phone number, and discuss instructions and expectations.  I reminded RAINIE CRENSHAW to be prepared with any vital sign and/or heart rhythm information that could potentially be obtained via home monitoring, at the time of her visit. I reminded ANE CONERLY to expect a phone call prior to her visit.  Genice Rouge, RN 10/14/2019 9:18 AM   INSTRUCTIONS FOR DOWNLOADING THE MYCHART APP TO SMARTPHONE  - The patient must first make sure to have activated MyChart and know their login information - If Apple, go to [employer] and type in MyChart in the search bar and download the app. If Android, ask patient to go to [employer] and type in [place] in the search bar and download the app. The app is free but as with any other app downloads, their phone may require them to verify saved payment information or  Apple/Android password.  - The patient will need to then log into the app with their MyChart username and password, and select [facility] as their healthcare provider to link the account. When it is time for your visit, go to the MyChart app, find appointments, and click Begin Video Visit. Be sure to Select Allow for your device to access the Microphone and Camera for your visit. You will then be connected, and your provider will be with you shortly.  **If they have any issues connecting, or need assistance please contact MyChart service desk [phone])**  **If using a computer, in order to ensure the best quality for their visit they will need to use either of the following Internet Browsers: [employer] or Google Chrome**  IF USING DOXIMITY or DOXY.ME - The patient will receive a link just prior to their visit by text.     FULL LENGTH CONSENT FOR TELE-HEALTH VISIT   I hereby voluntarily request, consent and authorize [facility] and its employed or contracted physicians, physician assistants, nurse practitioners or other licensed health care professionals (the Practitioner), to provide me with telemedicine health care services (the "Services") as deemed necessary by the treating Practitioner. I acknowledge and consent to receive the Services by the Practitioner via telemedicine. I understand that the telemedicine visit will involve communicating with the Practitioner through live audiovisual communication technology and the disclosure of certain medical information by electronic transmission. I acknowledge that I have been given the opportunity to request an in-person assessment or other available alternative prior to the telemedicine visit and am voluntarily participating in the telemedicine visit.  I understand that I have the right to withhold or withdraw my consent to the use of telemedicine in the course of my care at any time, without affecting my right to future care  or treatment, and that the Practitioner or I may terminate the telemedicine visit at any time. I understand that I have the right to inspect all information obtained and/or recorded in the course of the telemedicine visit and may receive copies of available information for a reasonable fee.  I understand that some of the potential risks of receiving the Services via telemedicine include:  Marland Kitchen Delay or interruption in medical evaluation due to technological equipment failure or disruption; . Information transmitted may not be sufficient (e.g. poor resolution of images) to allow for appropriate medical decision making by the Practitioner; and/or  . In rare instances, security protocols could fail, causing a breach of personal health information.  Furthermore, I acknowledge that it is my responsibility to provide information about my medical history, conditions and care that is complete and accurate to the best of my ability. I acknowledge that Practitioner's advice, recommendations, and/or decision may be based on factors not within their control, such as incomplete or inaccurate data provided by me or distortions of diagnostic images or specimens that may result from electronic transmissions. I understand that the  practice of medicine is not an Chief Strategy Officer and that Practitioner makes no warranties or guarantees regarding treatment outcomes. I acknowledge that I will receive a copy of this consent concurrently upon execution via email to the email address I last provided but may also request a printed copy by calling the office of [place].    I understand that my insurance will be billed for this visit.   I have read or had this consent read to me. . I understand the contents of this consent, which adequately explains the benefits and risks of the Services being provided via telemedicine.  . I have been provided ample opportunity to ask questions regarding this consent and the Services and have had  my questions answered to my satisfaction. . I give my informed consent for the services to be provided through the use of telemedicine in my medical care  By participating in this telemedicine visit I agree to the above.

## 2019-10-14 NOTE — Patient Instructions (Addendum)
Medication Instructions:  Your physician recommends that you continue on your current medications as directed. Please refer to the Current Medication list given to you today.  *If you need a refill on your cardiac medications before your next appointment, please call your pharmacy*  Lab Work: Your physician recommends that you return for lab work on November 12,2020 at 9:45  If you have labs (blood work) drawn today and your tests are completely normal, you will receive your results only by: Marland Kitchen MyChart Message (if you have MyChart) OR . A paper copy in the mail If you have any lab test that is abnormal or we need to change your treatment, we will call you to review the results.  Testing/Procedures: none  Follow-Up: At [facility], you and your health needs are our priority.  As part of our continuing mission to provide you with exceptional heart care, we have created designated Provider Care Teams.  These Care Teams include your primary Cardiologist (physician) and Advanced Practice Providers (APPs -  Physician Assistants and Nurse Practitioners) who all work together to provide you with the care you need, when you need it.  Your next appointment:   6 months  The format for your next appointment:   In Person  Provider:   You may see Fransico Him, MD or one of the following Advanced Practice Providers on your designated Care Team:    Melina Copa, PA-C  Ermalinda Barrios, PA-C   Other Instructions

## 2019-10-15 ENCOUNTER — Other Ambulatory Visit: Payer: Self-pay | Admitting: Family Medicine

## 2019-10-21 ENCOUNTER — Other Ambulatory Visit: Payer: Medicare HMO | Admitting: *Deleted

## 2019-10-21 ENCOUNTER — Other Ambulatory Visit: Payer: Self-pay

## 2019-10-21 DIAGNOSIS — E7800 Pure hypercholesterolemia, unspecified: Secondary | ICD-10-CM

## 2019-10-21 DIAGNOSIS — I4819 Other persistent atrial fibrillation: Secondary | ICD-10-CM

## 2019-10-21 DIAGNOSIS — I2510 Atherosclerotic heart disease of native coronary artery without angina pectoris: Secondary | ICD-10-CM

## 2019-10-21 DIAGNOSIS — I10 Essential (primary) hypertension: Secondary | ICD-10-CM | POA: Diagnosis not present

## 2019-10-25 LAB — COMPREHENSIVE METABOLIC PANEL
ALT: 7 IU/L (ref 0–32)
AST: 13 IU/L (ref 0–40)
Albumin/Globulin Ratio: 1.5 (ref 1.2–2.2)
Albumin: 4.3 g/dL (ref 3.8–4.8)
Alkaline Phosphatase: 59 IU/L (ref 39–117)
BUN/Creatinine Ratio: 21 (ref 12–28)
BUN: 19 mg/dL (ref 8–27)
Bilirubin Total: 0.3 mg/dL (ref 0.0–1.2)
CO2: 26 mmol/L (ref 20–29)
Calcium: 10.4 mg/dL — ABNORMAL HIGH (ref 8.7–10.3)
Chloride: 101 mmol/L (ref 96–106)
Creatinine, Ser: 0.9 mg/dL (ref 0.57–1.00)
GFR calc Af Amer: 75 mL/min/{1.73_m2} (ref >59)
GFR calc non Af Amer: 65 mL/min/{1.73_m2} (ref >59)
Globulin, Total: 2.8 g/dL (ref 1.5–4.5)
Glucose: 126 mg/dL — ABNORMAL HIGH (ref 65–99)
Potassium: 4.2 mmol/L (ref 3.5–5.2)
Sodium: 139 mmol/L (ref 134–144)
Total Protein: 7.1 g/dL (ref 6.0–8.5)

## 2019-10-25 LAB — CBC
Hematocrit: 37.6 % (ref 34.0–46.6)
Hemoglobin: 12.7 g/dL (ref 11.1–15.9)
MCH: 25.4 pg — ABNORMAL LOW (ref 26.6–33.0)
MCHC: 33.8 g/dL (ref 31.5–35.7)
MCV: 75 fL — ABNORMAL LOW (ref 79–97)
Platelets: 240 10*3/uL (ref 150–450)
RBC: 5 x10E6/uL (ref 3.77–5.28)
RDW: 14 % (ref 11.7–15.4)
WBC: 6 10*3/uL (ref 3.4–10.8)

## 2019-11-19 ENCOUNTER — Other Ambulatory Visit: Payer: Self-pay | Admitting: *Deleted

## 2019-11-19 NOTE — Telephone Encounter (Signed)
Pt calls wondering what else she can take instead of tylenol for her leg pain.  She wants to know if the PCP will call in some more Voltaren gel so that she does not use as much tylenol.  Advised I would send a message to PCP.  Appt made for 12/30/19 as pt is only available on Thurs / Fri. Christen Bame, CMA

## 2019-11-22 ENCOUNTER — Other Ambulatory Visit: Payer: Self-pay | Admitting: Family Medicine

## 2019-11-22 MED ORDER — DICLOFENAC SODIUM 1 % EX GEL: 4.0000 g | Freq: Four times a day (QID) | CUTANEOUS | 2 refills | Status: DC

## 2019-11-22 NOTE — Telephone Encounter (Signed)
Called phone number twice. Rang with no answer and then hung up. If pt calls, please give her the information below. Ottis Stain, CMA

## 2019-11-22 NOTE — Telephone Encounter (Signed)
Please let Meredith [name] know that voltaren gel is now available over the counter.  I will send in a prescription for it just in case it is cheaper that way, but she no longer needs a prescription for this medication.

## 2019-12-19 ENCOUNTER — Other Ambulatory Visit: Payer: Self-pay | Admitting: Cardiology

## 2019-12-23 ENCOUNTER — Ambulatory Visit (INDEPENDENT_AMBULATORY_CARE_PROVIDER_SITE_OTHER): Payer: Medicare HMO

## 2019-12-23 ENCOUNTER — Other Ambulatory Visit: Payer: Self-pay

## 2019-12-23 DIAGNOSIS — Z0000 Encounter for general adult medical examination without abnormal findings: Secondary | ICD-10-CM

## 2019-12-23 NOTE — Patient Instructions (Addendum)
You spoke to Meredith [name], [place] over the phone for your annual wellness visit.  We discussed goals: Goals    . Eat more fruits and vegetables    . HEMOGLOBIN A1C < 7     A1c 7.3 in September 2020    . Maintain current level of activity (pt-stated)     Walking 20 minutes daily      We also discussed recommended health maintenance. Please call our office and schedule a visit. As discussed, you are due for the following. See below for reference. We talked about getting your flu vaccine and PNA vaccine at next office visit.   Health Maintenance  Topic Date Due  . PNA vac Low Risk Adult (1 of 2 - PCV13) 03/04/2015  . OPHTHALMOLOGY EXAM  09/05/2017  . TETANUS/TDAP  12/10/2017  . FOOT EXAM  08/14/2018  . INFLUENZA VACCINE  03/08/2020 (Originally 07/10/2019)  . HEMOGLOBIN A1C  02/24/2020  . COLONOSCOPY  05/22/2020  . MAMMOGRAM  09/08/2020  . DEXA SCAN  Completed  . Hepatitis C Screening  Completed   Keep up the good work walking, this will help reach your A1c goal! When you are ready to fill out an advance directive, we have them here at [facility]. PCP apt for your leg pain on 1/21 @1 :30p. Consider a flu vaccine this year, as we discussed current Pandemic.  Health Maintenance, Female Adopting a healthy lifestyle and getting preventive care are important in promoting health and wellness. Ask your health care provider about:  The right schedule for you to have regular tests and exams.  Things you can do on your own to prevent diseases and keep yourself healthy. What should I know about diet, weight, and exercise? Eat a healthy diet   Eat a diet that includes plenty of vegetables, fruits, low-fat dairy products, and lean protein.  Do not eat a lot of foods that are high in solid fats, added sugars, or sodium. Maintain a healthy weight Body mass index (BMI) is used to identify weight problems. It estimates body fat based on height and weight. Your health care provider can help determine  your BMI and help you achieve or maintain a healthy weight. Get regular exercise Get regular exercise. This is one of the most important things you can do for your health. Most adults should:  Exercise for at least 150 minutes each week. The exercise should increase your heart rate and make you sweat (moderate-intensity exercise).  Do strengthening exercises at least twice a week. This is in addition to the moderate-intensity exercise.  Spend less time sitting. Even light physical activity can be beneficial. Watch cholesterol and blood lipids Have your blood tested for lipids and cholesterol at 70 years of age, then have this test every 5 years. Have your cholesterol levels checked more often if:  Your lipid or cholesterol levels are high.  You are older than 70 years of age.  You are at high risk for heart disease. What should I know about cancer screening? Depending on your health history and family history, you may need to have cancer screening at various ages. This may include screening for:  Breast cancer.  Cervical cancer.  Colorectal cancer.  Skin cancer.  Lung cancer. What should I know about heart disease, diabetes, and high blood pressure? Blood pressure and heart disease  High blood pressure causes heart disease and increases the risk of stroke. This is more likely to develop in people who have high blood pressure readings, are  of African descent, or are overweight.  Have your blood pressure checked: ? Every 3-5 years if you are 52-75 years of age. ? Every year if you are 49 years old or older. Diabetes Have regular diabetes screenings. This checks your fasting blood sugar level. Have the screening done:  Once every three years after age 21 if you are at a normal weight and have a low risk for diabetes.  More often and at a younger age if you are overweight or have a high risk for diabetes. What should I know about preventing infection? Hepatitis B If you have  a higher risk for hepatitis B, you should be screened for this virus. Talk with your health care provider to find out if you are at risk for hepatitis B infection. Hepatitis C Testing is recommended for:  Everyone born from [birth year] through 1966.  Anyone with known risk factors for hepatitis C. Sexually transmitted infections (STIs)  Get screened for STIs, including gonorrhea and chlamydia, if: ? You are sexually active and are younger than 70 years of age. ? You are older than 70 years of age and your health care provider tells you that you are at risk for this type of infection. ? Your sexual activity has changed since you were last screened, and you are at increased risk for chlamydia or gonorrhea. Ask your health care provider if you are at risk.  Ask your health care provider about whether you are at high risk for HIV. Your health care provider may recommend a prescription medicine to help prevent HIV infection. If you choose to take medicine to prevent HIV, you should first get tested for HIV. You should then be tested every 3 months for as long as you are taking the medicine. Pregnancy  If you are about to stop having your period (premenopausal) and you may become pregnant, seek counseling before you get pregnant.  Take 400 to 800 micrograms (mcg) of folic acid every day if you become pregnant.  Ask for birth control (contraception) if you want to prevent pregnancy. Osteoporosis and menopause Osteoporosis is a disease in which the bones lose minerals and strength with aging. This can result in bone fractures. If you are 57 years old or older, or if you are at risk for osteoporosis and fractures, ask your health care provider if you should:  Be screened for bone loss.  Take a calcium or vitamin D supplement to lower your risk of fractures.  Be given hormone replacement therapy (HRT) to treat symptoms of menopause. Follow these instructions at home: Lifestyle  Do not use any  products that contain nicotine or tobacco, such as cigarettes, e-cigarettes, and chewing tobacco. If you need help quitting, ask your health care provider.  Do not use street drugs.  Do not share needles.  Ask your health care provider for help if you need support or information about quitting drugs. Alcohol use  Do not drink alcohol if: ? Your health care provider tells you not to drink. ? You are pregnant, may be pregnant, or are planning to become pregnant.  If you drink alcohol: ? Limit how much you use to 0-1 drink a day. ? Limit intake if you are breastfeeding.  Be aware of how much alcohol is in your drink. In the [place] one drink equals one 12 oz bottle of beer (355 mL), one 5 oz glass of wine (148 mL), or one 1 oz glass of hard liquor (44 mL). General instructions  Schedule regular  health, dental, and eye exams.  Stay current with your vaccines.  Tell your health care provider if: ? You often feel depressed. ? You have ever been abused or do not feel safe at home. Summary  Adopting a healthy lifestyle and getting preventive care are important in promoting health and wellness.  Follow your health care provider's instructions about healthy diet, exercising, and getting tested or screened for diseases.  Follow your health care provider's instructions on monitoring your cholesterol and blood pressure. This information is not intended to replace advice given to you by your health care provider. Make sure you discuss any questions you have with your health care provider. Document Revised: 11/18/2018 Document Reviewed: 11/18/2018 Elsevier Patient Education  [employer].   Our clinic's number is [phone]. Please call with questions or concerns about what we discussed today.

## 2019-12-23 NOTE — Progress Notes (Addendum)
Subjective:   Meredith [name] is a 69 y.o. female who presents for Medicare Annual (Subsequent) preventive examination.  The patient consented to a virtual visit.  Review of Systems: Defer to PCP  Cardiac Risk Factors include: advanced age (>83mn, >>48women);diabetes mellitus  Objective:   Vitals: There were no vitals taken for this visit.  There is no height or weight on file to calculate BMI.  Advanced Directives 12/23/2019 08/27/2019 10/22/2018 10/15/2018 05/22/2018 05/01/2018 08/14/2017  Does Patient Have a Medical Advance Directive? No No No No No No No  Would patient like information on creating a medical advance directive? No - Guardian declined No - Patient declined - No - Patient declined No - Patient declined No - Patient declined Yes (MAU/Ambulatory/Procedural Areas - Information given)   Tobacco Social History   Tobacco Use  Smoking Status Former Smoker  . Packs/day: 1.00  . Years: 5.00  . Pack years: 5.00  . Types: Cigarettes  . Quit date: 12/09/1978  . Years since quitting: 41.0  Smokeless Tobacco Never Used     Counseling given: Not Answered  Clinical Intake:  Pre-visit preparation completed: Yes  How often do you need to have someone help you when you read instructions, pamphlets, or other written materials from your doctor or pharmacy?: 2 - Rarely What is the last grade level you completed in school?: 11th grade  Interpreter Needed?: No  Past Medical History:  Diagnosis Date  . ANEMIA, IRON DEFICIENCY, UNSPEC. 02/05/2007   Qualifier: Diagnosis of  By: DDamita DunningsMD, GPhillip Heal   . CAD (coronary artery disease), native coronary artery 02/16/2015   Cath with 20% LCx and RCA  . Colon polyps   . Diabetes mellitus   . Diverticulosis 05/17/12  . DJD (degenerative joint disease) of lumbar spine   . Enteritis   . Gastric ulcer 04/2000  . Hyperlipidemia   . Hypertension   . Irregular heart beat   . Obesity   . Osteoarthritis   . Persistent atrial fibrillation (HCC)      CHADS2VASC score is 5 and on Xarelto  . Renal cyst, left 05/17/12  . Ventral hernia    Past Surgical History:  Procedure Laterality Date  . ABDOMINAL HYSTERECTOMY    . LEFT HEART CATHETERIZATION WITH CORONARY ANGIOGRAM N/A 02/16/2015   Procedure: LEFT HEART CATHETERIZATION WITH CORONARY ANGIOGRAM;  Surgeon: CBurnell Blanks MD;  [facility];  Service: Cardiovascular;  Laterality: N/A;  . OPERATIVE HYSTEROSCOPY    . TUBAL LIGATION    . UTERINE FIBROID SURGERY     Family History  Problem Relation Age of Onset  . Diabetes Mother   . Kidney disease Mother   . Heart disease Mother   . Lung cancer Father   . Heart disease Father   . Cancer Father   . Stomach cancer Sister   . Cancer Sister   . Clotting disorder Other        neice  . Diabetes Sister   . Diabetes Sister   . Diabetes Sister   . Diabetes Sister   . Colon cancer Neg Hx    Social History   Socioeconomic History  . Marital status: Divorced    Spouse name: Not on file  . Number of children: 4  . Years of education: 164 . Highest education level: Not on file  Occupational History  . 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  Smoking status: Former Smoker    Packs/day: 1.00    Years: 5.00    Pack years: 5.00    Types: Cigarettes    Quit date: 12/09/1978    Years since quitting: 41.0  . Smokeless tobacco: Never Used  Substance and Sexual Activity  . Alcohol use: No  . Drug use: No  . Sexual activity: Not Currently    Birth control/protection: Surgical  Other Topics Concern  . Not on file  Social History Narrative   Patient lives alone in [place].   Patient still works 3 days a week at [employer] to keep herself busy.   Patient enjoys walking everyday and speaking with various neighbors.    Patient enjoys watching tv, puzzle books, spending time with her family, and reading her prayers.                                                                                                       Social Determinants of Health   Financial Resource Strain: Low Risk   . Difficulty of Paying Living Expenses: Not hard at all  Food Insecurity: No Food Insecurity  . Worried About Charity fundraiser in the Last Year: Never true  . Ran Out of Food in the Last Year: Never true  Transportation Needs: No Transportation Needs  . Lack of Transportation (Medical): No  . Lack of Transportation (Non-Medical): No  Physical Activity: Insufficiently Active  . Days of Exercise per Week: 4 days  . Minutes of Exercise per Session: 30 min  Stress: No Stress Concern Present  . Feeling of Stress : Only a little  Social Connections: Somewhat Isolated  . Frequency of Communication with Friends and Family: More than three times a week  . Frequency of Social Gatherings with Friends and Family: More than three times a week  . Attends Religious Services: More than 4 times per year  . Active Member of Clubs or Organizations: No  . Attends Archivist Meetings: Never  . Marital Status: Divorced   Outpatient Encounter Medications as of 12/23/2019  Medication Sig  . ACCU-CHEK AVIVA PLUS test strip USE AS INSTRUCTED TEST BLOOD GLUCOSE ONCE DAILY. ICD-10 CODE: E11.9  . ACCU-CHEK SOFTCLIX LANCETS lancets Use as instructed to test blood glucose once daily. ICD-10 code: E11.9  . acetaminophen (ACETAMINOPHEN 8 HOUR) 650 MG CR tablet Take 1 tablet (650 mg total) by mouth every 8 (eight) hours.  Marland Kitchen amLODipine (NORVASC) 10 MG tablet TAKE 1 TABLET BY MOUTH EVERY DAY  . baclofen (LIORESAL) 10 MG tablet Take 1 tablet (10 mg total) by mouth 3 (three) times daily as needed for muscle spasms.  . Blood Glucose Monitoring Suppl (ACCU-CHEK AVIVA PLUS) w/Device KIT 1 kit by Does not apply route as directed. ICD-10 code: E11.9  . diclofenac sodium (VOLTAREN) 1 % GEL Apply 2 g topically 4 (four) times daily.  . diclofenac Sodium (VOLTAREN) 1 % GEL Apply 4 g topically 4 (four) times  daily.  Marland Kitchen ezetimibe (ZETIA) 10 MG tablet Take 1 tablet (10 mg total) by mouth daily.  Marland Kitchen  hydrochlorothiazide (HYDRODIURIL) 25 MG tablet TAKE 1 TABLET BY MOUTH EVERY DAY  . Lancets Misc. (ACCU-CHEK SOFTCLIX LANCET DEV) KIT Use as directed to test once daily. ICD-10 code: E11.9  . metFORMIN (GLUCOPHAGE) 1000 MG tablet TAKE 1 TABLET BY MOUTH 2 TIMES DAILY WITH A MEAL.  . metoprolol succinate (TOPROL-XL) 50 MG 24 hr tablet TAKE 1 TABLET (50 MG TOTAL) BY MOUTH DAILY. TAKE WITH OR IMMEDIATELY FOLLOWING A MEAL.  Marland Kitchen potassium chloride (K-DUR) 10 MEQ tablet TAKE 2 TABLETS BY MOUTH TWICE A DAY  . rivaroxaban (XARELTO) 20 MG TABS tablet TAKE 1 TABLET BY MOUTH EVERY DAY WITH SUPPER  . rosuvastatin (CRESTOR) 20 MG tablet TAKE 1 TABLET BY MOUTH EVERY DAY  . traMADol (ULTRAM) 50 MG tablet Take 1 tablet (50 mg total) by mouth every 8 (eight) hours as needed.  . diazepam (VALIUM) 5 MG tablet Take 0.5 tablets (2.5 mg total) by mouth every 8 (eight) hours as needed for anxiety or muscle spasms (spasms). (Patient not taking: Reported on 12/23/2019)   No facility-administered encounter medications on file as of 12/23/2019.   Activities of Daily Living In your present state of health, do you have any difficulty performing the following activities: 12/23/2019  Hearing? N  Vision? N  Difficulty concentrating or making decisions? N  Walking or climbing stairs? Y  Comment uses a cane  Dressing or bathing? N  Doing errands, shopping? N  Preparing Food and eating ? N  Using the Toilet? N  In the past six months, have you accidently leaked urine? N  Do you have problems with loss of bowel control? N  Managing your Medications? N  Managing your Finances? N  Housekeeping or managing your Housekeeping? N  Some recent data might be hidden   Patient Care Team: Kathrene Alu, MD as PCP - General (Family Medicine) Sueanne Margarita, MD as PCP - Cardiology (Cardiology) Rutherford Guys, MD as Consulting Physician  (Ophthalmology)    Assessment:   This is a routine wellness examination for Meredith [name].  Exercise Activities and Dietary recommendations Current Exercise Habits: Home exercise routine, Type of exercise: walking, Time (Minutes): 30, Frequency (Times/Week): 4, Weekly Exercise (Minutes/Week): 120, Intensity: Mild, Exercise limited by: orthopedic condition(s)  Goals    . Eat more fruits and vegetables    . HEMOGLOBIN A1C < 7     A1c 7.3 in September 2020    . Maintain current level of activity (pt-stated)     Walking 20 minutes daily      Fall Risk Fall Risk  12/23/2019 08/27/2019 10/15/2018 05/22/2018 05/22/2018  Falls in the past year? 0 0 0 No No  Number falls in past yr: - - - - -  Injury with Fall? - - - - -  Risk for fall due to : Other (Comment) - - - -  Risk for fall due to: Comment uses a cane - - - -  Follow up Falls prevention discussed - - - -   Is the patient's home free of loose throw rugs in walkways, pet beds, electrical cords, etc?   yes      Grab bars in the bathroom? yes      Handrails on the stairs?   yes      Adequate lighting?   yes  Patient rating of health (0-10) scale: 8   Depression Screen PHQ 2/9 Scores 12/23/2019 08/27/2019 12/04/2018 10/15/2018  PHQ - 2 Score 0 0 0 0    Cognitive Function   6CIT  Screen 12/23/2019  What Year? 0 points  What month? 0 points  What time? 0 points  Count back from 20 4 points  Months in reverse 4 points  Repeat phrase 0 points  Total Score 8   Immunization History  Administered Date(s) Administered  . Td 02/07/1996, 12/11/2007   Screening Tests Health Maintenance  Topic Date Due  . PNA vac Low Risk Adult (1 of 2 - PCV13) 03/04/2015  . OPHTHALMOLOGY EXAM  09/05/2017  . TETANUS/TDAP  12/10/2017  . FOOT EXAM  08/14/2018  . INFLUENZA VACCINE  03/08/2020 (Originally 07/10/2019)  . HEMOGLOBIN A1C  02/24/2020  . COLONOSCOPY  05/22/2020  . MAMMOGRAM  09/08/2020  . DEXA SCAN  Completed  . Hepatitis C Screening  Completed    Cancer Screenings: Lung: Low Dose CT Chest recommended if Age 66-80 years, 30 pack-year currently smoking OR have quit w/in 15years. Patient does not qualify. Breast:  Up to date on Mammogram? Yes   Up to date of Bone Density/Dexa? Yes Colorectal: UTD  Additional Screenings: Hepatitis C Screening: Completed   Plan:  Keep up the good work walking, this will help reach your A1c goal! When you are ready to fill out an advance directive, we have them here at [facility]. PCP apt for your leg pain on 1/21 '@1' :30p. Consider a flu vaccine this year, as we discussed current Pandemic.   I have personally reviewed and noted the following in the patient's chart:   . Medical and social history . Use of alcohol, tobacco or illicit drugs  . Current medications and supplements . Functional ability and status . Nutritional status . Physical activity . Advanced directives . List of other physicians . Hospitalizations, surgeries, and ER visits in previous 12 months . Vitals . Screenings to include cognitive, depression, and falls . Referrals and appointments  In addition, I have reviewed and discussed with patient certain preventive protocols, quality metrics, and best practice recommendations. A written personalized care plan for preventive services as well as general preventive health recommendations were provided to patient.  This visit was conducted virtually in the setting of the [place] pandemic.    Dorna Bloom, [place]  12/23/2019    I have reviewed this visit and agree with the documentation.   Amanda C. Shan Levans, MD PGY-3, [facility] 12/23/2019 9:15 PM

## 2019-12-30 ENCOUNTER — Ambulatory Visit (INDEPENDENT_AMBULATORY_CARE_PROVIDER_SITE_OTHER): Payer: Medicare HMO | Admitting: Family Medicine

## 2019-12-30 ENCOUNTER — Encounter: Payer: Self-pay | Admitting: Family Medicine

## 2019-12-30 ENCOUNTER — Other Ambulatory Visit: Payer: Self-pay

## 2019-12-30 VITALS — BP 110/72 | HR 67 | Wt 243.8 lb

## 2019-12-30 DIAGNOSIS — M79604 Pain in right leg: Secondary | ICD-10-CM

## 2019-12-30 DIAGNOSIS — E119 Type 2 diabetes mellitus without complications: Secondary | ICD-10-CM

## 2019-12-30 LAB — POCT GLYCOSYLATED HEMOGLOBIN (HGB A1C): HbA1c, POC (controlled diabetic range): 8.6 % — AB (ref 0.0–7.0)

## 2019-12-30 NOTE — Progress Notes (Signed)
Subjective:    Meredith [name] - 69 y.o. female MRN [id number]  Date of birth: 1951-02-15  CC:  Meredith [name] is here for leg pain and follow up of diabetes.  HPI: Leg pain:  R leg on the lateral side of shin.  Exacerbated by dorsiflexion of the ipsilateral foot.  Relieved by voltaren gel.  Duration of about one week.  No inciting event that she remembers, although she did buy a new mattress and finds that it is too firm.  Denies swelling or bruising.  Denies weakness.  Diabetes:  CBGs: low to mid 100s Meds: metformin 1,000 BID Hypo/hyperglycemia symptoms: none Diet: says that she has been drinking sodas and eating chocolate more than she should lately   Health Maintenance:  Health Maintenance Due  Topic Date Due  . PNA vac Low Risk Adult (1 of 2 - PCV13) 03/04/2015  . OPHTHALMOLOGY EXAM  09/05/2017  . TETANUS/TDAP  12/10/2017    -  reports that she quit smoking about 41 years ago. Her smoking use included cigarettes. She has a 5.00 pack-year smoking history. She has never used smokeless tobacco. - Review of Systems: Per HPI. - Past Medical History: Patient Active Problem List   Diagnosis Date Noted  . Leg pain, lateral, right 12/31/2019  . Close exposure to COVID-19 virus 06/17/2019  . Hypokalemia 12/04/2018  . Restless leg syndrome 12/04/2018  . Osteopenia 09/15/2017  . Pain of both hip joints 06/13/2017  . Knee pain, chronic 07/21/2016  . CTS (carpal tunnel syndrome) 06/06/2016  . Estrogen deficiency 03/29/2016  . CAD (coronary artery disease), native coronary artery 10/26/2015  . Back pain 08/17/2015  . Persistent atrial fibrillation (HCC) 02/23/2015  . PAC (premature atrial contraction) 01/19/2015  . Atrial tachycardia, paroxysmal (HCC) 01/19/2015  . Irregular heart rhythm 11/25/2014  . Healthcare maintenance 03/04/2014  . ASTHMA, INTERMITTENT 09/07/2010  . HERNIA, VENTRAL 04/18/2010  . 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 Diabetes mellitus  type II, controlled, with no complications (HCC) 02/05/2007  . HYPERCHOLESTEROLEMIA 02/05/2007  . Obesity 02/05/2007  . DEPRESSION, MAJOR, RECURRENT 02/05/2007  . HYPERTENSION, BENIGN SYSTEMIC 02/05/2007  . GASTROESOPHAGEAL REFLUX, NO ESOPHAGITIS 02/05/2007  . OSTEOARTHRITIS, MULTI SITES 02/05/2007   - Medications: reviewed and updated   Objective:   Physical Exam BP 110/72   Pulse 67   Wt 243 lb 12.8 oz (110.6 kg)   SpO2 99%   BMI 44.59 kg/m  Gen: NAD, alert, cooperative with exam, well-appearing CV: RRR, good S1/S2, no murmur, no edema Resp: CTABL, no wheezes, non-labored Extremities: No visual abnormality of the left or right leg.  No tenderness to palpation of the lateral right leg.  No effusion palpated or tenderness to palpation of the knee.  5/5 strength dorsiflexion and plantar flexion of the right foot.  Full ROM.  Neurovascularly intact. Diabetic Foot Exam - Simple   Simple Foot Form Diabetic Foot exam was performed with the following findings: Yes 12/30/2019  2:00 PM  Visual Inspection No deformities, no ulcerations, no other skin breakdown bilaterally: Yes Sensation Testing Intact to touch and monofilament testing bilaterally: Yes Pulse Check Posterior Tibialis and Dorsalis pulse intact bilaterally: Yes Comments          Assessment & Plan:   Diabetes mellitus type II, controlled, with no complications (HCC) Hemoglobin A1c is 8.6 today.  Discussed with patient the importance of dietary modifications including reduction of sodas and chocolate.  We will retest in 3 months and not make any other  medication changes.  Leg pain, lateral, right Reassuring that her exam is normal today.  This is likely due to pressure on this leg from her mattress and perhaps how she sleeps at night.  Encourage patient to continue using Voltaren gel since this is helpful for her and to let me know if this becomes worse.    Maia Breslow, M.D. 12/31/2019, 7:04 AM PGY-3, [place]

## 2019-12-30 NOTE — Patient Instructions (Addendum)
It was nice seeing you today Ms. Sayer!  Your leg pain is likely due to sore muscles may be from the mattress that you are using or from your knee pain.  You can continue using Voltaren gel on this and it should get better with time.  Consider putting a mattress Topper on your bed to make your bed more comfortable.  Your A1c today was 8.6, which is up from 7.34 months ago.  Work on reducing your soda and chocolate intake and I will see back in 3 months for a recheck.  If you have any questions or concerns, please feel free to call the clinic.   Be well,  Dr. Frances Furbish

## 2019-12-31 DIAGNOSIS — M79604 Pain in right leg: Secondary | ICD-10-CM | POA: Insufficient documentation

## 2019-12-31 NOTE — Assessment & Plan Note (Signed)
Reassuring that her exam is normal today.  This is likely due to pressure on this leg from her mattress and perhaps how she sleeps at night.  Encourage patient to continue using Voltaren gel since this is helpful for her and to let me know if this becomes worse.

## 2019-12-31 NOTE — Assessment & Plan Note (Signed)
Hemoglobin A1c is 8.6 today.  Discussed with patient the importance of dietary modifications including reduction of sodas and chocolate.  We will retest in 3 months and not make any other medication changes.

## 2020-01-09 ENCOUNTER — Other Ambulatory Visit: Payer: Self-pay | Admitting: Cardiology

## 2020-01-10 ENCOUNTER — Other Ambulatory Visit: Payer: Self-pay | Admitting: *Deleted

## 2020-01-10 MED ORDER — METOPROLOL SUCCINATE ER 50 MG PO TB24: 50.0000 mg | ORAL_TABLET | Freq: Every day | ORAL | 0 refills | Status: DC

## 2020-01-23 ENCOUNTER — Other Ambulatory Visit: Payer: Self-pay | Admitting: Family Medicine

## 2020-02-03 DIAGNOSIS — H52203 Unspecified astigmatism, bilateral: Secondary | ICD-10-CM | POA: Diagnosis not present

## 2020-02-03 DIAGNOSIS — H2513 Age-related nuclear cataract, bilateral: Secondary | ICD-10-CM | POA: Diagnosis not present

## 2020-02-03 DIAGNOSIS — E1136 Type 2 diabetes mellitus with diabetic cataract: Secondary | ICD-10-CM | POA: Diagnosis not present

## 2020-02-03 DIAGNOSIS — H524 Presbyopia: Secondary | ICD-10-CM | POA: Diagnosis not present

## 2020-02-03 LAB — HM DIABETES EYE EXAM

## 2020-02-15 ENCOUNTER — Other Ambulatory Visit: Payer: Self-pay | Admitting: Family Medicine

## 2020-03-01 ENCOUNTER — Other Ambulatory Visit: Payer: Self-pay | Admitting: Cardiology

## 2020-03-21 ENCOUNTER — Other Ambulatory Visit: Payer: Self-pay | Admitting: Family Medicine

## 2020-03-21 ENCOUNTER — Other Ambulatory Visit: Payer: Self-pay | Admitting: Cardiology

## 2020-03-29 ENCOUNTER — Encounter: Payer: Self-pay | Admitting: Gastroenterology

## 2020-04-20 ENCOUNTER — Other Ambulatory Visit: Payer: Self-pay | Admitting: Cardiology

## 2020-07-06 ENCOUNTER — Other Ambulatory Visit: Payer: Self-pay

## 2020-07-06 ENCOUNTER — Encounter: Payer: Self-pay | Admitting: Cardiology

## 2020-07-06 ENCOUNTER — Ambulatory Visit: Payer: Medicare HMO | Admitting: Cardiology

## 2020-07-06 VITALS — BP 124/80 | HR 74 | Ht 62.0 in | Wt 237.0 lb

## 2020-07-06 DIAGNOSIS — I10 Essential (primary) hypertension: Secondary | ICD-10-CM

## 2020-07-06 DIAGNOSIS — I2510 Atherosclerotic heart disease of native coronary artery without angina pectoris: Secondary | ICD-10-CM | POA: Diagnosis not present

## 2020-07-06 DIAGNOSIS — E7800 Pure hypercholesterolemia, unspecified: Secondary | ICD-10-CM | POA: Diagnosis not present

## 2020-07-06 DIAGNOSIS — I4819 Other persistent atrial fibrillation: Secondary | ICD-10-CM

## 2020-07-06 NOTE — Addendum Note (Signed)
Addended by: Theresia Majors on: 07/06/2020 03:56 PM   Modules accepted: Orders

## 2020-07-06 NOTE — Progress Notes (Signed)
Cardiology Office Note   Date:  07/06/2020   ID:  Meredith, [name] 11/14/1951, MRN [id number]  PCP:  Meredith Essex, MD  Cardiologist:  Meredith Him, MD  Electrophysiologist:  None   Chief Complaint:  Afib, CAD, HLD, HTN  History of Present Illness:    Meredith [name] is a 69 y.o. female with a hx of atrial tachycardia,persistentAF on Xarelto, nonobstructive ASCAD with 20% LCx and RCA by cath, HTN and dyslipidemia.  She is here today for followup and is doing well.  She denies any chest pain or pressure, SOB, DOE, PND, orthopnea, LE edema, dizziness, palpitations or syncope. She is compliant with her meds and is tolerating meds with no SE.      Prior CV studies:   The following studies were reviewed today:  EKG  Past Medical History:  Diagnosis Date  . ANEMIA, IRON DEFICIENCY, UNSPEC. 02/05/2007   Qualifier: Diagnosis of  By: Damita Dunnings MD, Phillip Heal    . CAD (coronary artery disease), native coronary artery 02/16/2015   Cath with 20% LCx and RCA  . Colon polyps   . Diabetes mellitus   . Diverticulosis 05/17/12  . DJD (degenerative joint disease) of lumbar spine   . Enteritis   . Gastric ulcer 04/2000  . Hyperlipidemia   . Hypertension   . Irregular heart beat   . Obesity   . Osteoarthritis   . Persistent atrial fibrillation (HCC)    CHADS2VASC score is 5 and on Xarelto  . Renal cyst, left 05/17/12  . Ventral hernia    Past Surgical History:  Procedure Laterality Date  . ABDOMINAL HYSTERECTOMY    . LEFT HEART CATHETERIZATION WITH CORONARY ANGIOGRAM N/A 02/16/2015   Procedure: LEFT HEART CATHETERIZATION WITH CORONARY ANGIOGRAM;  Surgeon: Burnell Blanks, MD;  Location: [facility] CATH LAB;  Service: Cardiovascular;  Laterality: N/A;  . OPERATIVE HYSTEROSCOPY    . TUBAL LIGATION    . UTERINE FIBROID SURGERY       Current Meds  Medication Sig  . ACCU-CHEK AVIVA PLUS test strip USE AS INSTRUCTED TEST BLOOD GLUCOSE ONCE DAILY. ICD-10 CODE: E11.9  . ACCU-CHEK SOFTCLIX  LANCETS lancets Use as instructed to test blood glucose once daily. ICD-10 code: E11.9  . acetaminophen (ACETAMINOPHEN 8 HOUR) 650 MG CR tablet Take 1 tablet (650 mg total) by mouth every 8 (eight) hours.  Marland Kitchen amLODipine (NORVASC) 10 MG tablet TAKE 1 TABLET BY MOUTH EVERY DAY  . Blood Glucose Monitoring Suppl (ACCU-CHEK AVIVA PLUS) w/Device KIT 1 kit by Does not apply route as directed. ICD-10 code: E11.9  . diclofenac sodium (VOLTAREN) 1 % GEL Apply 2 g topically 4 (four) times daily.  Marland Kitchen ezetimibe (ZETIA) 10 MG tablet Take 1 tablet (10 mg total) by mouth daily.  . hydrochlorothiazide (HYDRODIURIL) 25 MG tablet TAKE 1 TABLET BY MOUTH EVERY DAY  . metFORMIN (GLUCOPHAGE) 1000 MG tablet TAKE 1 TABLET BY MOUTH 2 TIMES DAILY WITH A MEAL  . metoprolol succinate (TOPROL-XL) 50 MG 24 hr tablet TAKE 1 TABLET BY MOUTH DAILY. TAKE WITH OR IMMEDIATELY FOLLOWING A MEAL.  Marland Kitchen potassium chloride (KLOR-CON) 10 MEQ tablet TAKE 2 TABLETS BY MOUTH TWICE A DAY  . rivaroxaban (XARELTO) 20 MG TABS tablet TAKE 1 TABLET BY MOUTH EVERY DAY WITH SUPPER  . rosuvastatin (CRESTOR) 20 MG tablet TAKE 1 TABLET BY MOUTH EVERY DAY  . traMADol (ULTRAM) 50 MG tablet Take 1 tablet (50 mg total) by mouth every 8 (eight) hours as needed.  Allergies:   Lisinopril, Doxycycline, Flexeril [cyclobenzaprine hcl], Lipitor [atorvastatin calcium], and Metaxalone   Social History   Tobacco Use  . Smoking status: Former Smoker    Packs/day: 1.00    Years: 5.00    Pack years: 5.00    Types: Cigarettes    Quit date: 12/09/1978    Years since quitting: 41.6  . Smokeless tobacco: Never Used  Vaping Use  . Vaping Use: Never used  Substance Use Topics  . Alcohol use: No  . Drug use: No     Family Hx: The patient's family history includes Clotting disorder in an other family member; Diabetes in her mother, sister, sister, sister, and sister; Heart disease in her father and mother; Kidney disease in her mother; Lung cancer in her father;  Stomach cancer in her sister. There is no history of Colon cancer.  ROS:   Please see the history of present illness.    none All other systems reviewed and are negative.   Labs/Other Tests and Data Reviewed:    Recent Labs: 10/21/2019: ALT 7; BUN 19; Creatinine, Ser 0.90; Hemoglobin 12.7; Platelets 240; Potassium 4.2; Sodium 139   Recent Lipid Panel Lab Results  Component Value Date/Time   CHOL 142 02/04/2019 09:44 AM   TRIG 77 02/04/2019 09:44 AM   HDL 55 02/04/2019 09:44 AM   CHOLHDL 2.6 02/04/2019 09:44 AM   CHOLHDL 2.3 05/16/2016 07:39 AM   LDLCALC 72 02/04/2019 09:44 AM   LDLDIRECT 130 (H) 05/13/2012 09:17 AM    Wt Readings from Last 3 Encounters:  07/06/20 (!) 237 lb (107.5 kg)  12/30/19 243 lb 12.8 oz (110.6 kg)  10/14/19 229 lb (103.9 kg)     Objective:    Vital Signs:  BP 124/80 (BP Location: Left Arm, Patient Position: Sitting, Cuff Size: Large)   Pulse 74   Ht '5\' 2"'  (1.575 m)   Wt (!) 237 lb (107.5 kg)   SpO2 96%   BMI 43.35 kg/m   GEN: Well nourished, well developed in no acute distress HEENT: Normal NECK: No JVD; No carotid bruits LYMPHATICS: No lymphadenopathy CARDIAC:RRR, no murmurs, rubs, gallops.  Occasional ectopy RESPIRATORY:  Clear to auscultation without rales, wheezing or rhonchi  ABDOMEN: Soft, non-tender, non-distended MUSCULOSKELETAL:  No edema; No deformity  SKIN: Warm and dry NEUROLOGIC:  Alert and oriented x 3 PSYCHIATRIC:  Normal affect   EKG was performed today in the office showing NSR with trigeminal PACs and septal infarct  ASSESSMENT & PLAN:    1.  Persistent atrial fibrillation -she denies any palpitations and no bleeding issues on Xarelto -continue Toprol XL 66m daily and Xarelto 255mdaily -check BMET and CBC  2.  HTN -BP controlled -continue Toprol XL 5054maily, amlodipine 34m62mily and HCTZ 25mg60mly  3.  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 -she denies any anginal sx -continue BB and  statin -no ASA due to DOAC  4.  HLD -LDL goal < 70 -check FLp and ALT -continue Crestor 20mg 53my  Medication Adjustments/Labs and Tests Ordered: Current medicines are reviewed at length with the patient today.  Concerns regarding medicines are outlined above.  Tests Ordered: Orders Placed This Encounter  Procedures  . EKG 12-Lead   Medication Changes: No orders of the defined types were placed in this encounter.   Disposition:  Follow up in 1 year(s)  Signed, Geonna Lockyer Meredith Him7/29/2021 3:51 PM    [facility]

## 2020-07-06 NOTE — Patient Instructions (Signed)
Medication Instructions:  Your physician recommends that you continue on your current medications as directed. Please refer to the Current Medication list given to you today.  *If you need a refill on your cardiac medications before your next appointment, please call your pharmacy*   Lab Work: TODAY: CMET, FLP, CBC If you have labs (blood work) drawn today and your tests are completely normal, you will receive your results only by: Marland Kitchen MyChart Message (if you have MyChart) OR . A paper copy in the mail If you have any lab test that is abnormal or we need to change your treatment, we will call you to review the results.   Follow-Up: At [facility], you and your health needs are our priority.  As part of our continuing mission to provide you with exceptional heart care, we have created designated Provider Care Teams.  These Care Teams include your primary Cardiologist (physician) and Advanced Practice Providers (APPs -  Physician Assistants and Nurse Practitioners) who all work together to provide you with the care you need, when you need it.  We recommend signing up for the patient portal called "MyChart".  Sign up information is provided on this After Visit Summary.  MyChart is used to connect with patients for Virtual Visits (Telemedicine).  Patients are able to view lab/test results, encounter notes, upcoming appointments, etc.  Non-urgent messages can be sent to your provider as well.   To learn more about what you can do with MyChart, go to [email or web address].    Your next appointment:   1 year(s)  The format for your next appointment:   In Person  Provider:   You may see Armanda Magic, MD or one of the following Advanced Practice Providers on your designated Care Team:    Ronie Spies, PA-C  Jacolyn Reedy, PA-C

## 2020-07-07 LAB — COMPREHENSIVE METABOLIC PANEL
ALT: 11 IU/L (ref 0–32)
AST: 10 IU/L (ref 0–40)
Albumin/Globulin Ratio: 1.4 (ref 1.2–2.2)
Albumin: 4.3 g/dL (ref 3.8–4.8)
Alkaline Phosphatase: 59 IU/L (ref 48–121)
BUN/Creatinine Ratio: 19 (ref 12–28)
BUN: 19 mg/dL (ref 8–27)
Bilirubin Total: 0.3 mg/dL (ref 0.0–1.2)
CO2: 26 mmol/L (ref 20–29)
Calcium: 10 mg/dL (ref 8.7–10.3)
Chloride: 99 mmol/L (ref 96–106)
Creatinine, Ser: 1.01 mg/dL — ABNORMAL HIGH (ref 0.57–1.00)
GFR calc Af Amer: 65 mL/min/{1.73_m2} (ref >59)
GFR calc non Af Amer: 57 mL/min/{1.73_m2} — ABNORMAL LOW (ref >59)
Globulin, Total: 3 g/dL (ref 1.5–4.5)
Glucose: 230 mg/dL — ABNORMAL HIGH (ref 65–99)
Potassium: 3.3 mmol/L — ABNORMAL LOW (ref 3.5–5.2)
Sodium: 142 mmol/L (ref 134–144)
Total Protein: 7.3 g/dL (ref 6.0–8.5)

## 2020-07-07 LAB — CBC
Hematocrit: 38.6 % (ref 34.0–46.6)
Hemoglobin: 13.3 g/dL (ref 11.1–15.9)
MCH: 27 pg (ref 26.6–33.0)
MCHC: 34.5 g/dL (ref 31.5–35.7)
MCV: 78 fL — ABNORMAL LOW (ref 79–97)
Platelets: 224 10*3/uL (ref 150–450)
RBC: 4.93 x10E6/uL (ref 3.77–5.28)
RDW: 14.2 % (ref 11.7–15.4)
WBC: 8.3 10*3/uL (ref 3.4–10.8)

## 2020-07-07 LAB — LIPID PANEL
Chol/HDL Ratio: 2.8 ratio (ref 0.0–4.4)
Cholesterol, Total: 142 mg/dL (ref 100–199)
HDL: 51 mg/dL (ref >39)
LDL Chol Calc (NIH): 70 mg/dL (ref 0–99)
Triglycerides: 115 mg/dL (ref 0–149)
VLDL Cholesterol Cal: 21 mg/dL (ref 5–40)

## 2020-07-11 ENCOUNTER — Other Ambulatory Visit: Payer: Self-pay | Admitting: *Deleted

## 2020-07-11 ENCOUNTER — Telehealth: Payer: Self-pay

## 2020-07-11 DIAGNOSIS — I10 Essential (primary) hypertension: Secondary | ICD-10-CM

## 2020-07-11 MED ORDER — EZETIMIBE 10 MG PO TABS: 10.0000 mg | ORAL_TABLET | Freq: Every day | ORAL | 3 refills | Status: DC

## 2020-07-11 NOTE — Telephone Encounter (Signed)
-----   Message from Quintella Reichert, MD sent at 07/11/2020  1:12 PM EDT ----- Repeat BMET in 1 week

## 2020-07-11 NOTE — Telephone Encounter (Signed)
Spoke with the patient and she will come in next week 08/12 for repeat BMET

## 2020-07-13 ENCOUNTER — Other Ambulatory Visit: Payer: Self-pay

## 2020-07-13 ENCOUNTER — Other Ambulatory Visit: Payer: Medicare HMO | Admitting: *Deleted

## 2020-07-13 DIAGNOSIS — I10 Essential (primary) hypertension: Secondary | ICD-10-CM | POA: Diagnosis not present

## 2020-07-13 LAB — BASIC METABOLIC PANEL
BUN/Creatinine Ratio: 17 (ref 12–28)
BUN: 15 mg/dL (ref 8–27)
CO2: 23 mmol/L (ref 20–29)
Calcium: 10.1 mg/dL (ref 8.7–10.3)
Chloride: 101 mmol/L (ref 96–106)
Creatinine, Ser: 0.9 mg/dL (ref 0.57–1.00)
GFR calc Af Amer: 75 mL/min/{1.73_m2} (ref >59)
GFR calc non Af Amer: 65 mL/min/{1.73_m2} (ref >59)
Glucose: 182 mg/dL — ABNORMAL HIGH (ref 65–99)
Potassium: 3.9 mmol/L (ref 3.5–5.2)
Sodium: 138 mmol/L (ref 134–144)

## 2020-07-14 ENCOUNTER — Telehealth: Payer: Self-pay | Admitting: Cardiology

## 2020-07-14 NOTE — Telephone Encounter (Signed)
New message  Patient is returning call for lab results. Please give patient a call back.

## 2020-07-14 NOTE — Telephone Encounter (Signed)
The patient has been notified of the result and verbalized understanding.  All questions (if any) were answered. Theresia Majors, RN 07/14/2020 9:50 AM

## 2020-07-20 ENCOUNTER — Other Ambulatory Visit: Payer: Medicare HMO

## 2020-08-25 ENCOUNTER — Other Ambulatory Visit: Payer: Self-pay | Admitting: Family Medicine

## 2020-08-25 DIAGNOSIS — Z1231 Encounter for screening mammogram for malignant neoplasm of breast: Secondary | ICD-10-CM

## 2020-09-11 ENCOUNTER — Other Ambulatory Visit: Payer: Self-pay | Admitting: *Deleted

## 2020-09-15 ENCOUNTER — Other Ambulatory Visit: Payer: Self-pay

## 2020-09-15 ENCOUNTER — Other Ambulatory Visit: Payer: Self-pay | Admitting: *Deleted

## 2020-09-15 ENCOUNTER — Ambulatory Visit
Admission: RE | Admit: 2020-09-15 | Discharge: 2020-09-15 | Disposition: A | Discharge status: Home / Self Care | Payer: Medicare HMO | Source: Ambulatory Visit | Attending: Family Medicine | Admitting: Family Medicine

## 2020-09-15 DIAGNOSIS — Z1231 Encounter for screening mammogram for malignant neoplasm of breast: Secondary | ICD-10-CM

## 2020-09-15 MED ORDER — RIVAROXABAN 20 MG PO TABS: ORAL_TABLET | ORAL | 3 refills | Status: DC

## 2020-09-15 NOTE — Telephone Encounter (Signed)
Patient presents to clinic requesting rx refill on Xarelto. Advised patient that she has not been seen in the office since 01/21 and that she may need an appointment for rx refills. Patient scheduled for 11/19 at 1:45 pm.   To PCP  Veronda Prude, RN

## 2020-09-15 NOTE — Telephone Encounter (Signed)
Opened in error.   Kaneshia Cater C Joram Venson, RN  

## 2020-10-27 ENCOUNTER — Encounter: Payer: Self-pay | Admitting: Family Medicine

## 2020-10-27 ENCOUNTER — Ambulatory Visit (INDEPENDENT_AMBULATORY_CARE_PROVIDER_SITE_OTHER): Payer: Medicare HMO | Admitting: Family Medicine

## 2020-10-27 ENCOUNTER — Other Ambulatory Visit: Payer: Self-pay

## 2020-10-27 VITALS — BP 144/90 | HR 78 | Ht 62.0 in | Wt 241.0 lb

## 2020-10-27 DIAGNOSIS — M25551 Pain in right hip: Secondary | ICD-10-CM

## 2020-10-27 DIAGNOSIS — M8580 Other specified disorders of bone density and structure, unspecified site: Secondary | ICD-10-CM | POA: Diagnosis not present

## 2020-10-27 DIAGNOSIS — E119 Type 2 diabetes mellitus without complications: Secondary | ICD-10-CM | POA: Diagnosis not present

## 2020-10-27 DIAGNOSIS — G8929 Other chronic pain: Secondary | ICD-10-CM

## 2020-10-27 DIAGNOSIS — M25561 Pain in right knee: Secondary | ICD-10-CM | POA: Diagnosis not present

## 2020-10-27 DIAGNOSIS — R200 Anesthesia of skin: Secondary | ICD-10-CM | POA: Diagnosis not present

## 2020-10-27 DIAGNOSIS — I10 Essential (primary) hypertension: Secondary | ICD-10-CM

## 2020-10-27 DIAGNOSIS — Z0000 Encounter for general adult medical examination without abnormal findings: Secondary | ICD-10-CM

## 2020-10-27 DIAGNOSIS — M5137 Other intervertebral disc degeneration, lumbosacral region: Secondary | ICD-10-CM | POA: Diagnosis not present

## 2020-10-27 DIAGNOSIS — M25552 Pain in left hip: Secondary | ICD-10-CM

## 2020-10-27 DIAGNOSIS — Z6841 Body Mass Index (BMI) 40.0 and over, adult: Secondary | ICD-10-CM

## 2020-10-27 DIAGNOSIS — M25562 Pain in left knee: Secondary | ICD-10-CM

## 2020-10-27 DIAGNOSIS — M51379 Other intervertebral disc degeneration, lumbosacral region without mention of lumbar back pain or lower extremity pain: Secondary | ICD-10-CM

## 2020-10-27 DIAGNOSIS — Z1211 Encounter for screening for malignant neoplasm of colon: Secondary | ICD-10-CM

## 2020-10-27 LAB — POCT GLYCOSYLATED HEMOGLOBIN (HGB A1C): Hemoglobin A1C: 9.9 % — AB (ref 4.0–5.6)

## 2020-10-27 MED ORDER — LIDO-CAPSAICIN-MEN-METHYL SAL 0.5-0.035-5-20 % EX PTCH: 1.0000 | MEDICATED_PATCH | Freq: Every day | CUTANEOUS | 3 refills | Status: DC | PRN

## 2020-10-27 MED ORDER — ACCU-CHEK AVIVA PLUS VI STRP: ORAL_STRIP | 12 refills | Status: DC

## 2020-10-27 MED ORDER — LIDOCAINE 4 % EX PTCH: 1.0000 | MEDICATED_PATCH | Freq: Every day | CUTANEOUS | 12 refills | Status: DC | PRN

## 2020-10-27 MED ORDER — TRAMADOL HCL 50 MG PO TABS: 50.0000 mg | ORAL_TABLET | Freq: Three times a day (TID) | ORAL | 0 refills | Status: DC | PRN

## 2020-10-27 MED ORDER — ACCU-CHEK SOFTCLIX LANCETS MISC: 12 refills | Status: DC

## 2020-10-27 MED ORDER — AMLODIPINE BESYLATE 10 MG PO TABS: 10.0000 mg | ORAL_TABLET | Freq: Every day | ORAL | 3 refills | Status: DC

## 2020-10-27 NOTE — Assessment & Plan Note (Addendum)
Worsening A1c.  9.9 today, up from 8.6 in January 2021.  Still on Metformin 1000 mg twice daily.  Meredith [name] declines additional medication, would like to improve diet and exercise.  Follow-up in 3 months or sooner.

## 2020-10-27 NOTE — Assessment & Plan Note (Signed)
Numbness of right thumb, index, middle finger.  Impaired extension of right middle finger.  Not impairing daily activities or work.  No pain or tingling.  Recommend Voltaren gel, heat, rest, stretching.

## 2020-10-27 NOTE — Progress Notes (Signed)
SUBJECTIVE:   CHIEF COMPLAINT / HPI:   Diabetes She is here mainly for diabetes checkup.  A1c today 9.9, up from 8.6 in January 2021.  Briefly discussed medication options on top of her max dose Metformin.  She declines any additional medication at this time.  She would like to try her best with diet and exercise and follow-up in 3 months to see the result.  She says that she has done this before and got her A1c down to 6 or 7 with diet and exercise and that she thinks she can do it again.  She adamantly declines any additional medications that involve needles or injections.  Right finger numbness For the last year, experiences constant finger numbness of the right thumb index and middle fingers.  Denies tingling, pain, limited range of motion.  Says that "it is just like when you put your hand on a frozen bag of vegetables for too long you cannot feel anything".  Also notes that she cannot fully extend her right middle finger.  Sometimes she has trouble curling her fingers under, but no problem with grip strength. No relieving or aggravating factors.  This numbness does not seem to affect her everyday life, grip strength, or use of her hand.    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  She does not type on a keyboard, lift heavy objects, or do other repetitive motions.  She reports going to physical therapy about 2 years ago for right hand pain but that it was expensive and "not worth much".  She says that she can do a lot of the exercises and everything at home.  Bilateral knee pain She reports insidious worsening of her bilateral knee pain for quite some time now.  She is most stiff in the mornings, needs to stretch out before getting up.  Sometimes her legs are stiff and hard to bend.  She adamantly expresses that she wants to avoid surgery.  She is currently prescribed  tramadol, Voltaren gel, and Tylenol which are providing some relief for her.  She only uses the tramadol when she is not driving to work.  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  She has previously been to physical therapy for this and does her exercises at home sometimes, although not on a regular schedule.  PERTINENT  PMH / PSH: Hypertension, atrial fibrillation, CAD, GERD, T2DM, osteoarthritis, hypercholesterolemia, depression, chronic bilateral hip and knee pain, restless leg syndrome  OBJECTIVE:   BP (!) 144/90   Pulse 78   Ht 5\' 2"  (1.575 m)   Wt 241 lb (109.3 kg)   SpO2 99%   BMI 44.08 kg/m     PHQ-9:  Depression screen [facility] 2/9 10/27/2020 12/23/2019 08/27/2019  Decreased Interest 3 0 0  Down, Depressed, Hopeless 0 0 0  PHQ - 2 Score 3 0 0  Altered sleeping 2 - -  Tired, decreased energy 0 - -  Change in appetite 2 - -  Feeling bad or failure about yourself  0 - -  Trouble concentrating 2 - -  Moving slowly or fidgety/restless 0 - -  Suicidal thoughts 0 - -  PHQ-9 Score 9 - -  Some recent data might be hidden     GAD-7: No flowsheet data  found.    Physical Exam General: Awake, alert, oriented Cardiovascular: Regular rate and rhythm, S1 and S2 present, no murmurs auscultated Respiratory: Lung fields clear to auscultation bilaterally Extremities: Right index MIP joint larger than other MIPs without point tenderness, palpable swelling, or erythema.  Right middle finger unable to fully extend, max extension to 30 degrees.  Otherwise full ROM of both hands and wrists.  Equal grip strength, equal BUE strength.  Extremities: No bilateral lower extremity edema, palpable pedal and pretibial pulses bilaterally Neuro: Cranial nerves II through X grossly intact, able to move all extremities spontaneously   ASSESSMENT/PLAN:   Diabetes mellitus type II, controlled, with no complications (HCC) Worsening A1c.  9.9 today, up from 8.6 in  January 2021.  Still on Metformin 1000 mg twice daily.  Adamantly declines additional medication, would like to improve diet and exercise.  Follow-up in 3 months or sooner.  Numbness of fingers Numbness of right thumb, index, middle finger.  Impaired extension of right middle finger.  Not impairing daily activities or work.  No pain or tingling.  Recommend Voltaren gel, heat, rest, stretching.  Knee pain, chronic Gradually worsening knee pain.  Ordered repeat bilateral weightbearing x-rays.  Sent prescription for lidocaine gel.  Also recommend home PT-style exercises and float therapy such as pool aerobics.  Healthcare maintenance Referred to GI for colonoscopy.  Patient declined Tdap, flu, PCV 13 vaccinations today.     Fayette Pho, MD [facility]

## 2020-10-27 NOTE — Patient Instructions (Signed)
It was wonderful to meet you today. Thank you for allowing me to be a part of your care. Below is a short summary of what we discussed at your visit today:  Diabetes type 2 Your A1c today was 9.9.  This is elevated from your last measurement in January 2021 at 8.6.  There are several options we can do in terms of medications to help bring this number down, including some oral medications and injectable medications.  After discussion today, you said you would like to try diet and exercise first.  Please let me know if you would like to approach any of the medicine options.  Please come back in a month and 1/2 to 2 months to discuss your progress with diet and exercise.  Right finger numbness While this sounds bothersome, it appears that it is not affecting her daily functioning or life.  For right now, keep doing the hand exercises and stretches that you learned in physical therapy.  If you need pain relief, you can use some of the Voltaren gel you already have.  Warm heat or ice may help.  Let us know or come back if it worsens or starts to interfere with your daily activities.  Knee pain I have sent in a prescription for lidocaine patches for your knees.  You can apply them once daily as needed for pain.  Make sure to put on clean dry skin.  Do not use them at the same time as your Voltaren gel.  Also, given that your knee is are progressively hurting and getting worse, I would like to reimage your knees.  I have sent an order for x-rays of your knees.  This way, we can tell if you are having problems like osteoarthritis and worsening of the bone in your knee.  You may go to [facility] imaging center to get these done.   If you have any questions or concerns, please do not hesitate to contact us via phone or MyChart message.   Fayette Pho, MD

## 2020-10-27 NOTE — Assessment & Plan Note (Signed)
Referred to GI for colonoscopy.  Patient declined Tdap, flu, PCV 13 vaccinations today.

## 2020-10-27 NOTE — Assessment & Plan Note (Signed)
Gradually worsening knee pain.  Ordered repeat bilateral weightbearing x-rays.  Sent prescription for lidocaine gel.  Also recommend home PT-style exercises and float therapy such as pool aerobics.

## 2021-01-11 ENCOUNTER — Other Ambulatory Visit: Payer: Self-pay

## 2021-01-11 MED ORDER — METFORMIN HCL 1000 MG PO TABS
ORAL_TABLET | ORAL | 3 refills | Status: DC
Start: 2021-01-11 — End: 2022-01-04

## 2021-01-19 ENCOUNTER — Ambulatory Visit (INDEPENDENT_AMBULATORY_CARE_PROVIDER_SITE_OTHER): Payer: Medicare HMO | Admitting: Family Medicine

## 2021-01-19 ENCOUNTER — Other Ambulatory Visit: Payer: Self-pay

## 2021-01-19 ENCOUNTER — Encounter: Payer: Self-pay | Admitting: Family Medicine

## 2021-01-19 VITALS — BP 126/80 | HR 69 | Wt 235.6 lb

## 2021-01-19 DIAGNOSIS — E7800 Pure hypercholesterolemia, unspecified: Secondary | ICD-10-CM | POA: Diagnosis not present

## 2021-01-19 DIAGNOSIS — E119 Type 2 diabetes mellitus without complications: Secondary | ICD-10-CM

## 2021-01-19 DIAGNOSIS — Z6841 Body Mass Index (BMI) 40.0 and over, adult: Secondary | ICD-10-CM | POA: Diagnosis not present

## 2021-01-19 LAB — POCT GLYCOSYLATED HEMOGLOBIN (HGB A1C): Hemoglobin A1C: 10.1 % — AB (ref 4.0–5.6)

## 2021-01-19 MED ORDER — CANAGLIFLOZIN 100 MG PO TABS: 100.0000 mg | ORAL_TABLET | Freq: Every day | ORAL | 3 refills | Status: DC

## 2021-01-19 MED ORDER — EZETIMIBE 10 MG PO TABS: 10.0000 mg | ORAL_TABLET | Freq: Every day | ORAL | 3 refills | Status: DC

## 2021-01-19 NOTE — Patient Instructions (Signed)
It was wonderful to see you today. Thank you for allowing me to be a part of your care. Below is a short summary of what we discussed at your visit today:  Diabetes -A1c above 10 today -Continue Metformin as prescribed, 1000 mg twice daily -Start canagliflozin oral medication, take one 100 mg tablet daily before the first meal the day -Return to doctor in 3 to 4 weeks for follow-up of his new medication.  If you are tolerating the medicine well at that time, we will increase the dose to 300 mg daily.  Knee x-rays The knee x-rays I ordered last time are still active.  You may simply go to [facility] imaging at any time that is convenient for you to have these done.  There is also be sent to me as soon as they are done.  I have attached a map to the [place] imaging centers for you.   If you have any questions or concerns, please do not hesitate to contact us via phone or MyChart message.   Fayette Pho, MD

## 2021-01-21 NOTE — Progress Notes (Signed)
    SUBJECTIVE:   CHIEF COMPLAINT / HPI:   Diabetes check up Patient presents today for a 3 month follow up of diabetes. Has been taking metformin 1000 mg BID as prescribed. Denies side effects. Vehemently refuses injection medications such as ozempic or insulin. Has been trying very hard with her diet and improved physical activity. A1c today 10.1. Patient is very disappointed because she "thought there would be good news today".   PERTINENT  PMH / PSH: HTN, persistent A.fib, PAC, CAD, T2DM, GERD, osteoarthritis, HLD, obesity, depression  OBJECTIVE:   BP 126/80   Pulse 69   Wt 235 lb 9.6 oz (106.9 kg)   SpO2 98%   BMI 43.09 kg/m   PHQ-9:  Depression screen [facility] 2/9 01/19/2021 10/27/2020 12/23/2019  Decreased Interest 3 3 0  Down, Depressed, Hopeless 0 0 0  PHQ - 2 Score 3 3 0  Altered sleeping 0 2 -  Tired, decreased energy 2 0 -  Change in appetite 2 2 -  Feeling bad or failure about yourself  0 0 -  Trouble concentrating 1 2 -  Moving slowly or fidgety/restless 0 0 -  Suicidal thoughts 0 0 -  PHQ-9 Score 8 9 -  Difficult doing work/chores Somewhat difficult - -  Some recent data might be hidden     GAD-7: No flowsheet data found.   Physical Exam General: Awake, alert, oriented, no acute distress Respiratory: Unlabored respirations, speaking in full sentences, no respiratory distress Extremities: Moving all extremities spontaneously Neuro: Cranial nerves II through X grossly intact Psych: Normal insight and judgement   ASSESSMENT/PLAN:   Diabetes mellitus type II, controlled, with no complications (HCC) A1c today 10.1, increased from three months ago despite improve diet and exercise. Patient strongly declines injection medication.  - continue metformin 1000 mg BID - start canagliflozin 100 mg daily before first meal - f/u 3-4 weeks with plan to increase canagliflozin to 300 mg daily at that time     Fayette Pho, MD [facility]

## 2021-01-21 NOTE — Assessment & Plan Note (Signed)
A1c today 10.1, increased from three months ago despite improve diet and exercise. Patient strongly declines injection medication.  - continue metformin 1000 mg BID - start canagliflozin 100 mg daily before first meal - f/u 3-4 weeks with plan to increase canagliflozin to 300 mg daily at that time

## 2021-02-02 DIAGNOSIS — H524 Presbyopia: Secondary | ICD-10-CM | POA: Diagnosis not present

## 2021-02-02 DIAGNOSIS — E119 Type 2 diabetes mellitus without complications: Secondary | ICD-10-CM | POA: Diagnosis not present

## 2021-02-02 DIAGNOSIS — H52203 Unspecified astigmatism, bilateral: Secondary | ICD-10-CM | POA: Diagnosis not present

## 2021-02-02 DIAGNOSIS — H2513 Age-related nuclear cataract, bilateral: Secondary | ICD-10-CM | POA: Diagnosis not present

## 2021-02-02 LAB — HM DIABETES EYE EXAM

## 2021-02-15 ENCOUNTER — Other Ambulatory Visit: Payer: Self-pay

## 2021-02-15 ENCOUNTER — Ambulatory Visit (INDEPENDENT_AMBULATORY_CARE_PROVIDER_SITE_OTHER): Payer: Medicare HMO | Admitting: Family Medicine

## 2021-02-15 ENCOUNTER — Encounter: Payer: Self-pay | Admitting: Family Medicine

## 2021-02-15 DIAGNOSIS — R21 Rash and other nonspecific skin eruption: Secondary | ICD-10-CM | POA: Insufficient documentation

## 2021-02-15 DIAGNOSIS — W57XXXD Bitten or stung by nonvenomous insect and other nonvenomous arthropods, subsequent encounter: Secondary | ICD-10-CM

## 2021-02-15 DIAGNOSIS — S50361D Insect bite (nonvenomous) of right elbow, subsequent encounter: Secondary | ICD-10-CM | POA: Diagnosis not present

## 2021-02-15 DIAGNOSIS — W57XXXA Bitten or stung by nonvenomous insect and other nonvenomous arthropods, initial encounter: Secondary | ICD-10-CM | POA: Insufficient documentation

## 2021-02-15 HISTORY — DX: Rash and other nonspecific skin eruption: R21

## 2021-02-15 MED ORDER — TRIAMCINOLONE ACETONIDE 0.1 % EX CREA: 1.0000 "application " | TOPICAL_CREAM | Freq: Two times a day (BID) | CUTANEOUS | 0 refills | Status: DC

## 2021-02-15 NOTE — Progress Notes (Signed)
    SUBJECTIVE:   CHIEF COMPLAINT / HPI: Itch  Patient complaining of ratch on left inside of elbow.  Indicates it has been present for 3-4 days and has been very itchy.  No new recent medications or detergents.  Indicates was doing yard work in bushes.  No recent trauma to area or exposures.  Swelling initially but has decreased some.  No fever, decresed ROM of elbow, or pain.  Has been using OTC hydrocortisomne cream 1% but indicates has still had itching.  PERTINENT  PMH / PSH:   OBJECTIVE:   BP 127/68   Pulse 71   Ht 5\' 2"  (1.575 m)   Wt 226 lb 12.8 oz (102.9 kg)   SpO2 98%   BMI 41.48 kg/m    Physical Exam Constitutional:      General: She is not in acute distress.    Appearance: Normal appearance. She is not ill-appearing.  HENT:     Head: Normocephalic and atraumatic.     Mouth/Throat:     Mouth: Mucous membranes are moist.  Cardiovascular:     Rate and Rhythm: Normal rate and regular rhythm.     Pulses: Normal pulses.  Pulmonary:     Effort: Pulmonary effort is normal.     Breath sounds: Normal breath sounds.  Skin:    General: Skin is warm.     Comments: Few 1cm spaced out papules with overlying excoriations on left elbow (see image below)  Neurological:     Mental Status: She is alert.  Psychiatric:        Mood and Affect: Mood normal.        Behavior: Behavior normal.           ASSESSMENT/PLAN:   Insect bite Likely given appearance of rash and history.  Sign of excoriation but no concerning signs for infection present.  Instructed patient to avoid scratching. - Prescribe Triamcinalone 0.1% BID until rash resolves - Rash should continue to improve, if not improved by a week, recommend following up or sooner if develop significant pain swelling or fever     , MD [facility]

## 2021-02-15 NOTE — Patient Instructions (Signed)
It was good to see you today.  Thank you for coming in.  I think you have an insect bite.      I am prescribing you Triamcinolone cream to use 2 times a day.  If it does not improve in 1 week or becomes much worse, please come back and see [place].  If not please come back in 2 months to check your A1C.  Be Well, Dr Pecola Leisure

## 2021-02-15 NOTE — Assessment & Plan Note (Addendum)
Likely given appearance of rash and history.  Less likely contact Dermatitis. Sign of excoriation but no concerning signs for infection present.  Instructed patient to avoid scratching. - Prescribe Triamcinalone 0.1% BID until rash resolves - Rash should continue to improve, if not improved by a week, recommend following up or sooner if develop significant pain swelling or fever

## 2021-02-19 ENCOUNTER — Telehealth: Payer: Self-pay

## 2021-02-19 NOTE — Telephone Encounter (Signed)
Patient LVM on nurse line regarding continued itching. Called patient to gather more information. No answer, left HIPAA compliant VM for patient to return call to office to gather more information.   Veronda Prude, RN

## 2021-02-19 NOTE — Telephone Encounter (Signed)
Patient returns call to nurse line. States that itching is all over her body. States that itching did not start until she began taking Invokana. Patient states that triamcinolone cream has not been helping.   Please advise.   Veronda Prude, RN

## 2021-02-20 NOTE — Telephone Encounter (Signed)
Return the patient's call for worsening pruritis and rash. We booked her the first available appointment - tomorrow Wed 3/16 at 11:25am with Dr. Pecola Leisure.   Of note, patient recently started Invokana for uncontrolled diabetes (about one month ago). While Invokana has been shown to cause pruritis, I am unsure if it can cause rash.   Fayette Pho, MD

## 2021-02-20 NOTE — Telephone Encounter (Signed)
Patient calls nurse line again with continued complaints of itching. Patient stated the pharmacy told her to try Claritin. Patient reports she will try this today and let us know if she feels any relief.

## 2021-02-21 ENCOUNTER — Encounter: Payer: Self-pay | Admitting: Family Medicine

## 2021-02-21 ENCOUNTER — Other Ambulatory Visit: Payer: Self-pay

## 2021-02-21 ENCOUNTER — Ambulatory Visit (INDEPENDENT_AMBULATORY_CARE_PROVIDER_SITE_OTHER): Payer: Medicare HMO | Admitting: Family Medicine

## 2021-02-21 VITALS — BP 108/60 | HR 68 | Wt 222.0 lb

## 2021-02-21 DIAGNOSIS — L299 Pruritus, unspecified: Secondary | ICD-10-CM | POA: Diagnosis not present

## 2021-02-21 DIAGNOSIS — R21 Rash and other nonspecific skin eruption: Secondary | ICD-10-CM

## 2021-02-21 HISTORY — DX: Pruritus, unspecified: L29.9

## 2021-02-21 MED ORDER — TRIAMCINOLONE ACETONIDE 0.5 % EX OINT: 1.0000 "application " | TOPICAL_OINTMENT | Freq: Two times a day (BID) | CUTANEOUS | 3 refills | Status: DC

## 2021-02-21 MED ORDER — FEXOFENADINE HCL 180 MG PO TABS
180.0000 mg | ORAL_TABLET | Freq: Every day | ORAL | 11 refills | Status: DC
Start: 2021-02-21 — End: 2022-02-04

## 2021-02-21 NOTE — Progress Notes (Signed)
i  Subjective:   Patient ID: Meredith [name]    DOB: 02-18-1951, 70 y.o. female   MRN: [id number]  Meredith [name] is a 70 y.o. female here for Rash follow up.  Rash: Patient seen on 02/15/21 for pruritic rash on left medial elbow after working in her yard. Felt to be contact dermatitis and treated with Triamcinolone 0.1% BID.  She notes her skin continues to be itchy all over. She has new red areas on right arm, left upper back, one spot on left arm, and one spot on right lateral knee. 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 Denies any pets. Denies any new soaps, lotions, or detergents.   Review of Systems:  Per HPI.   Objective:   BP 108/60   Pulse 68   Wt 222 lb (100.7 kg)   SpO2 96%   BMI 40.60 kg/m  Vitals and nursing note reviewed.  General: pleasant older woman, sitting comfortably in exam chair, well nourished, well developed, in no acute distress with non-toxic appearance Resp: breathing comfortably on room air, speaking in full sentences Skin: warm, dry, firm erythematous papules (multiple in close proximity together) on right upper arm, one erythematous firm papule on left upper back, left medial distal upper arm near elbow, and one on lateral posterior knee with surrounding excoriations. Extensive dry skin appreciated on exam. No other rashes appreciate.  MSK:  gait normal Neuro: Alert and oriented, speech normal      Assessment & Plan:   Rash Acute. Appears most consistent with arthropod bite based physical exam. Does not appear consistent with scabies at this time.  - Recommended evaluation of home environment for triggers - Triamcinolone 0.5% ointment BID PRN - Sensitive soaps and detergents  - Start Allegra daily - keep nails short - moisturize BID-TID  Pruritus Acute. Generalized pruritis in setting of diffusely dry skin.  Suspect this most likely cause in conjunction with her bug bites.  - recommended moisturizing with ointment based emollients BID-TID - using sensitive-moisturizing soaps such as Dove when bathing - use sensitive detergents - Start Allegra 180mg  daily, can increase to BID dosing if needed - use Triamcinolone 0.05% BID PRN for specific areas  - CBC, CMP, TSH to rule out alternative causes for pruritis (thyroid, polycythemia, iron deficiency anemia, liver or gallbladder disease)  Orders Placed This Encounter  Procedures  . Comprehensive metabolic panel  . TSH  . CBC   Meds ordered this encounter  Medications  . triamcinolone ointment (KENALOG) 0.5 %    Sig: Apply 1 application topically 2 (two) times daily. For moderate to severe eczema.  Do not use for more than 1 week at a time.    Dispense:  60 g    Refill:  3  . fexofenadine (ALLEGRA) 180 MG tablet    Sig: Take 1 tablet (180 mg total) by mouth daily.    Dispense:  30 tablet    Refill:  11      , DO PGY-3, [facility] 02/21/2021 12:01 PM

## 2021-02-21 NOTE — Patient Instructions (Signed)
For your Rash: - Use Triamcinolone 0.5% ointment twice a day as needed for areas that itch  For your Itchiness: - Start Fexofenadine (Allegra) 180mg  daily  - Moisturize the skin with Vaseline, Eucerine, Cetaphil, or Aquaphor three times a day - use gentle soaps such as Dove sensitive or Cetaphil  Keep finger nails trimmed. When washing clothes, use a fragrance-free laundry detergent Recommend use of Air humidifier

## 2021-02-21 NOTE — Assessment & Plan Note (Signed)
Acute. Generalized pruritis in setting of diffusely dry skin. Suspect this most likely cause in conjunction with her bug bites.  - recommended moisturizing with ointment based emollients BID-TID - using sensitive-moisturizing soaps such as Dove when bathing - use sensitive detergents - Start Allegra 180mg  daily, can increase to BID dosing if needed - use Triamcinolone 0.05% BID PRN for specific areas  - CBC, CMP, TSH to rule out alternative causes for pruritis (thyroid, polycythemia, iron deficiency anemia, liver or gallbladder disease)

## 2021-02-21 NOTE — Assessment & Plan Note (Signed)
Acute. Appears most consistent with arthropod bite based physical exam. Does not appear consistent with scabies at this time.  - Recommended evaluation of home environment for triggers - Triamcinolone 0.5% ointment BID PRN - Sensitive soaps and detergents  - Start Allegra daily - keep nails short - moisturize BID-TID

## 2021-02-22 LAB — COMPREHENSIVE METABOLIC PANEL
ALT: 10 IU/L (ref 0–32)
AST: 18 IU/L (ref 0–40)
Albumin/Globulin Ratio: 1.4 (ref 1.2–2.2)
Albumin: 4.2 g/dL (ref 3.8–4.8)
Alkaline Phosphatase: 54 IU/L (ref 44–121)
BUN/Creatinine Ratio: 16 (ref 12–28)
BUN: 19 mg/dL (ref 8–27)
Bilirubin Total: 0.4 mg/dL (ref 0.0–1.2)
CO2: 24 mmol/L (ref 20–29)
Calcium: 10.4 mg/dL — ABNORMAL HIGH (ref 8.7–10.3)
Chloride: 102 mmol/L (ref 96–106)
Creatinine, Ser: 1.18 mg/dL — ABNORMAL HIGH (ref 0.57–1.00)
Globulin, Total: 3 g/dL (ref 1.5–4.5)
Glucose: 113 mg/dL — ABNORMAL HIGH (ref 65–99)
Potassium: 3.7 mmol/L (ref 3.5–5.2)
Sodium: 142 mmol/L (ref 134–144)
Total Protein: 7.2 g/dL (ref 6.0–8.5)
eGFR: 50 mL/min/{1.73_m2} — ABNORMAL LOW (ref >59)

## 2021-02-22 LAB — CBC
Hematocrit: 40.5 % (ref 34.0–46.6)
Hemoglobin: 13.4 g/dL (ref 11.1–15.9)
MCH: 26 pg — ABNORMAL LOW (ref 26.6–33.0)
MCHC: 33.1 g/dL (ref 31.5–35.7)
MCV: 79 fL (ref 79–97)
Platelets: 234 10*3/uL (ref 150–450)
RBC: 5.15 x10E6/uL (ref 3.77–5.28)
RDW: 14.5 % (ref 11.7–15.4)
WBC: 6.1 10*3/uL (ref 3.4–10.8)

## 2021-02-22 LAB — TSH: TSH: 2.28 u[IU]/mL (ref 0.450–4.500)

## 2021-03-26 ENCOUNTER — Other Ambulatory Visit: Payer: Self-pay

## 2021-03-26 MED ORDER — METOPROLOL SUCCINATE ER 50 MG PO TB24: 50.0000 mg | ORAL_TABLET | Freq: Every day | ORAL | 3 refills | Status: DC

## 2021-04-23 ENCOUNTER — Emergency Department (HOSPITAL_COMMUNITY): Payer: Worker's Compensation

## 2021-04-23 ENCOUNTER — Encounter (HOSPITAL_COMMUNITY): Payer: Self-pay

## 2021-04-23 ENCOUNTER — Emergency Department (HOSPITAL_COMMUNITY)
Admission: EM | Admit: 2021-04-23 | Discharge: 2021-04-24 | Disposition: A | Discharge status: Home / Self Care | Payer: Worker's Compensation | Attending: Emergency Medicine | Admitting: Emergency Medicine

## 2021-04-23 DIAGNOSIS — W1839XA Other fall on same level, initial encounter: Secondary | ICD-10-CM | POA: Insufficient documentation

## 2021-04-23 DIAGNOSIS — Y990 Civilian activity done for income or pay: Secondary | ICD-10-CM | POA: Insufficient documentation

## 2021-04-23 DIAGNOSIS — Z5321 Procedure and treatment not carried out due to patient leaving prior to being seen by health care provider: Secondary | ICD-10-CM | POA: Insufficient documentation

## 2021-04-23 DIAGNOSIS — M25511 Pain in right shoulder: Secondary | ICD-10-CM | POA: Insufficient documentation

## 2021-04-23 NOTE — ED Triage Notes (Signed)
Pt states that fell while she was working and has since been having R shoulder pain, mechanical fall, did not hit head no LOC.

## 2021-04-24 NOTE — ED Notes (Signed)
LWBS 

## 2021-05-02 NOTE — Progress Notes (Deleted)
    SUBJECTIVE:   CHIEF COMPLAINT / HPI:   Fall Follow up  Patient presented to ED on 04/23/21 for fall. LWBS. Initial x ray suggest follow up CT for fracture. CT suggesting MRI if clincial suspicion remained high. AC joint degeration, glenohumeral joint arthritis, possible rotator cuff tear.    PERTINENT  PMH / PSH: ***  OBJECTIVE:   There were no vitals taken for this visit.  ***  ASSESSMENT/PLAN:   No problem-specific Assessment & Plan notes found for this encounter.     Melene Plan, MD [facility]   {    This will disappear when note is signed, click to select method of visit    :1}

## 2021-05-03 ENCOUNTER — Ambulatory Visit: Payer: Medicare HMO

## 2021-05-03 NOTE — Progress Notes (Signed)
    SUBJECTIVE:   CHIEF COMPLAINT / HPI:   Fall:  Fell while at work at [employer] on 5/16.  She slipped on some ice or water.  She landed on her right shoulder.  She did not hit her head.  She went to the ED afterwards to be seen.  Had some imaging done but did not stay to be seen by Doctor Because she had to go back to work she states.  She now has trouble lifting her arm, but this is improving.  Still has shoulder pain.  The pain keeps her awake at night.  No numbness or tingling of the arm distal to the shoulder.  Has been trying Tylenol 500 mg once a day.  Does not use any topical medication on the shoulder.  Has topical Voltaren gel for her knees.  Patient does not want to have any surgery done.  She does not believe she can afford the copayments for physical therapy.  PERTINENT  PMH / PSH:   OBJECTIVE:   BP 122/66   Pulse 70   Ht 5\' 2"  (1.575 m)   Wt 222 lb 12.8 oz (101.1 kg)   SpO2 98%   BMI 40.75 kg/m   General: Alert and oriented.  No acute distress MSK: Tenderness to palpation anteriorly on the right shoulder.  Negative external rotation test, internal rotation positive for pain.  Empty can test positive for pain and weakness.  CT scan results 5/16: 1. No convincing acute fracture by CT. If clinical suspicion for acute osseous injury remains high, MRI may be pursued. 2. Findings suspicious for chronic rotator cuff tear including fatty atrophy of rotator cuff muscles, calcifications at the myotendinous junction and high-riding appearance of the humeral head. 3. Advanced AC joint degenerative changes with moderate arthritis at the glenohumeral joint  ASSESSMENT/PLAN:   Tear of rotator cuff Patient fell at work approximately 2 weeks ago.  Went to the ED later that day and had imaging done but no further evaluation.  CT scan showed no fracture but did show a rotator cuff tear that may be subacute or chronic as well as some AC joint degeneration.  Patient does not want  surgery and cannot afford PT at this time.  Advised her to use 1000 mg of Tylenol up to 3 times a day, use Voltaren gel in the shoulder, use topical heat on the shoulder, and referred her to sports medicine for further evaluation and instruction on home exercises.     6/16, MD [facility]

## 2021-05-04 ENCOUNTER — Other Ambulatory Visit: Payer: Self-pay

## 2021-05-04 ENCOUNTER — Ambulatory Visit (INDEPENDENT_AMBULATORY_CARE_PROVIDER_SITE_OTHER): Payer: Medicare HMO | Admitting: Family Medicine

## 2021-05-04 VITALS — BP 122/66 | HR 70 | Ht 62.0 in | Wt 222.8 lb

## 2021-05-04 DIAGNOSIS — E119 Type 2 diabetes mellitus without complications: Secondary | ICD-10-CM

## 2021-05-04 DIAGNOSIS — M75100 Unspecified rotator cuff tear or rupture of unspecified shoulder, not specified as traumatic: Secondary | ICD-10-CM | POA: Insufficient documentation

## 2021-05-04 LAB — POCT GLYCOSYLATED HEMOGLOBIN (HGB A1C): HbA1c, POC (controlled diabetic range): 7.3 % — AB (ref 0.0–7.0)

## 2021-05-04 NOTE — Patient Instructions (Signed)
It was nice to see you today,  Your x-ray showed a rotator cuff tear but no fracture.  For your pain you can take Tylenol 1000 mg 3 times a day (every 8 hours)  You can also rub your Voltaren gel on your shoulder.  You can also use direct heat application to the area.  I have referred you to the sports medicine doctors.  They will call you and you can discuss somewhat she will call us.  They can give you home exercises to do and further evaluate the degree of your rotator cuff injury.  Have a great day,  Frederic Jericho, MD

## 2021-05-04 NOTE — Assessment & Plan Note (Signed)
Patient fell at work approximately 2 weeks ago.  Went to the ED later that day and had imaging done but no further evaluation.  CT scan showed no fracture but did show a rotator cuff tear that may be subacute or chronic as well as some AC joint degeneration.  Patient does not want surgery and cannot afford PT at this time.  Advised her to use 1000 mg of Tylenol up to 3 times a day, use Voltaren gel in the shoulder, use topical heat on the shoulder, and referred her to sports medicine for further evaluation and instruction on home exercises.

## 2021-06-11 ENCOUNTER — Other Ambulatory Visit: Payer: Self-pay | Admitting: Cardiology

## 2021-06-15 ENCOUNTER — Other Ambulatory Visit: Payer: Self-pay | Admitting: Cardiology

## 2021-08-08 NOTE — Progress Notes (Signed)
Cardiology Office Note   Date:  08/09/2021   ID:  Meredith [name], DOB 1951-06-01, MRN [id number]  PCP:  Ezequiel Essex, MD  Cardiologist:  Fransico Him, MD She Electrophysiologist:  None   Chief Complaint:  Afib, CAD, HLD, HTN  History of Present Illness:    Meredith [name] is a 70 y.o. 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  She is here today for followup and is doing well.  She denies any chest pain or pressure, SOB, DOE(except with heavy exertiona), PND, orthopnea, LE edema, dizziness, palpitations or syncope. She is compliant with her meds and is tolerating meds with no SE.     Prior CV studies:   The following studies were reviewed today:  EKG  Past Medical History:  Diagnosis Date   ANEMIA, IRON DEFICIENCY, UNSPEC. 02/05/2007   Qualifier: Diagnosis of  By: Damita Dunnings MD, Phillip Heal     ASTHMA, INTERMITTENT 09/07/2010   Qualifier: Diagnosis of  By: Jess Barters MD, Cindee Salt     CAD (coronary artery disease), native coronary artery 02/16/2015   Cath with 20% LCx and RCA   Colon polyps    CTS (carpal tunnel syndrome) 06/06/2016   Diabetes mellitus    Diverticulosis 05/17/12   DJD (degenerative joint disease) of lumbar spine    Enteritis    Gastric ulcer 04/2000   Hyperlipidemia    Hypertension    Irregular heart beat    Obesity    Osteoarthritis    Persistent atrial fibrillation (HCC)    CHADS2VASC score is 5 and on Xarelto   Renal cyst, left 05/17/12   Ventral hernia    Past Surgical History:  Procedure Laterality Date   ABDOMINAL HYSTERECTOMY     LEFT HEART CATHETERIZATION WITH CORONARY ANGIOGRAM N/A 02/16/2015   Procedure: LEFT HEART CATHETERIZATION WITH CORONARY ANGIOGRAM;  Surgeon: Burnell Blanks, MD;  Location: [facility] CATH LAB;  Service: Cardiovascular;  Laterality: N/A;   OPERATIVE HYSTEROSCOPY     TUBAL LIGATION     UTERINE FIBROID SURGERY       Current Meds  Medication  Sig   Accu-Chek Softclix Lancets lancets Use as instructed to test blood glucose once daily. ICD-10 code: E11.9   acetaminophen (ACETAMINOPHEN 8 HOUR) 650 MG CR tablet Take 1 tablet (650 mg total) by mouth every 8 (eight) hours.   amLODipine (NORVASC) 10 MG tablet Take 1 tablet (10 mg total) by mouth daily.   Blood Glucose Monitoring Suppl (ACCU-CHEK AVIVA PLUS) w/Device KIT 1 kit by Does not apply route as directed. ICD-10 code: E11.9   canagliflozin (INVOKANA) 100 MG TABS tablet Take 1 tablet (100 mg total) by mouth daily. Take before first meal of day for four weeks. Return to doctor within 3rd or 4th week.   ezetimibe (ZETIA) 10 MG tablet Take 1 tablet (10 mg total) by mouth daily.   fexofenadine (ALLEGRA) 180 MG tablet Take 1 tablet (180 mg total) by mouth daily.   glucose blood (ACCU-CHEK AVIVA PLUS) test strip USE AS INSTRUCTED TEST BLOOD GLUCOSE ONCE DAILY. ICD-10 CODE: E11.9   hydrochlorothiazide (HYDRODIURIL) 25 MG tablet Take 1 tablet (25 mg total) by mouth daily. Please keep upcoming appt in September 2022 with Dr. Radford Pax before anymore refills. Thank you   Lidocaine (HM LIDOCAINE PATCH) 4 % PTCH Apply 1 each topically daily as needed.   metFORMIN (GLUCOPHAGE) 1000 MG tablet Take 1 tablet twice daily with  food.   metoprolol succinate (TOPROL-XL) 50 MG 24 hr tablet Take 1 tablet (50 mg total) by mouth daily. TAKE WITH OR IMMEDIATELY FOLLOWING A MEAL.   potassium chloride (KLOR-CON) 10 MEQ tablet TAKE 2 TABLETS BY MOUTH TWICE A DAY   rivaroxaban (XARELTO) 20 MG TABS tablet TAKE 1 TABLET BY MOUTH EVERY DAY WITH SUPPER   rosuvastatin (CRESTOR) 20 MG tablet TAKE 1 TABLET BY MOUTH EVERY DAY     Allergies:   Lisinopril, Doxycycline, Flexeril [cyclobenzaprine hcl], Lipitor [atorvastatin calcium], and Metaxalone   Social History   Tobacco Use   Smoking status: Former    Packs/day: 1.00    Years: 5.00    Pack years: 5.00    Types: Cigarettes    Quit date: 12/09/1978    Years since  quitting: 42.6   Smokeless tobacco: Never  Vaping Use   Vaping Use: Never used  Substance Use Topics   Alcohol use: No   Drug use: No     Family Hx: The patient's family history includes Clotting disorder in an other family member; Diabetes in her mother, sister, sister, sister, and sister; Heart disease in her father and mother; Kidney disease in her mother; Lung cancer in her father; Stomach cancer in her sister. There is no history of Colon cancer.  ROS:   Please see the history of present illness.    none All other systems reviewed and are negative.   Labs/Other Tests and Data Reviewed:    Recent Labs: 02/21/2021: ALT 10; BUN 19; Creatinine, Ser 1.18; Hemoglobin 13.4; Platelets 234; Potassium 3.7; Sodium 142; TSH 2.280   Recent Lipid Panel Lab Results  Component Value Date/Time   CHOL 142 07/06/2020 04:00 PM   TRIG 115 07/06/2020 04:00 PM   HDL 51 07/06/2020 04:00 PM   CHOLHDL 2.8 07/06/2020 04:00 PM   CHOLHDL 2.3 05/16/2016 07:39 AM   LDLCALC 70 07/06/2020 04:00 PM   LDLDIRECT 130 (H) 05/13/2012 09:17 AM    Wt Readings from Last 3 Encounters:  08/09/21 219 lb (99.3 kg)  05/04/21 222 lb 12.8 oz (101.1 kg)  04/23/21 222 lb (100.7 kg)     Objective:    Vital Signs:  BP 132/82   Pulse 66   Ht '5\' 2"'  (1.575 m)   Wt 219 lb (99.3 kg)   SpO2 97%   BMI 40.06 kg/m   GEN: Well nourished, well developed in no acute distress HEENT: Normal NECK: No JVD; No carotid bruits LYMPHATICS: No lymphadenopathy CARDIAC:RRR, no murmurs, rubs, gallops RESPIRATORY:  Clear to auscultation without rales, wheezing or rhonchi  ABDOMEN: Soft, non-tender, non-distended MUSCULOSKELETAL:  No edema; No deformity  SKIN: Warm and dry NEUROLOGIC:  Alert and oriented x 3 PSYCHIATRIC:  Normal affect    EKG was performed today in the office showing NSR with nonspecific T wave abnormality  ASSESSMENT & PLAN:    1.  Persistent atrial fibrillation -she is maintaining NSR and denies any  palpitations -she denies any bleeding problems on DOAC -Continue prescription drug management with Toprol XL 89m daily and Xarelto 220mdaily>refilled  -I have personally reviewed and interpreted outside labs performed by patient's PCP which showed SCr 1.18 and Hbg 13.4 in March 2022   2.  HTN -BP is well controlled on exam today -Continue prescription drug management with Toprol XL 5074maily, Amlodipine 92m60mily and HCTZ 25mg12mly>refilled   3.  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 -she has not had any anginal symptoms since  I saw her last -continue BB and statin -no ASA due to DOAC  4.  HLD -LDL goal < 70 -check FLP and ALT -Continue prescription drug management with Crestor 35m daily and Zetia 184mdaily>refilled    Medication Adjustments/Labs and Tests Ordered: Current medicines are reviewed at length with the patient today.  Concerns regarding medicines are outlined above.  Tests Ordered: Orders Placed This Encounter  Procedures   EKG 12-Lead    Medication Changes: No orders of the defined types were placed in this encounter.   Disposition:  Follow up in 1 year(s)  Signed, TrFransico HimMD  08/09/2021 9:14 AM    [facility]

## 2021-08-09 ENCOUNTER — Other Ambulatory Visit: Payer: Self-pay | Admitting: Family Medicine

## 2021-08-09 ENCOUNTER — Other Ambulatory Visit: Payer: Self-pay

## 2021-08-09 ENCOUNTER — Encounter: Payer: Self-pay | Admitting: Cardiology

## 2021-08-09 ENCOUNTER — Ambulatory Visit (INDEPENDENT_AMBULATORY_CARE_PROVIDER_SITE_OTHER): Payer: Medicare HMO | Admitting: Cardiology

## 2021-08-09 VITALS — BP 132/82 | HR 66 | Ht 62.0 in | Wt 219.0 lb

## 2021-08-09 DIAGNOSIS — I10 Essential (primary) hypertension: Secondary | ICD-10-CM

## 2021-08-09 DIAGNOSIS — Z1231 Encounter for screening mammogram for malignant neoplasm of breast: Secondary | ICD-10-CM

## 2021-08-09 DIAGNOSIS — I4819 Other persistent atrial fibrillation: Secondary | ICD-10-CM

## 2021-08-09 DIAGNOSIS — E7800 Pure hypercholesterolemia, unspecified: Secondary | ICD-10-CM | POA: Diagnosis not present

## 2021-08-09 DIAGNOSIS — I2510 Atherosclerotic heart disease of native coronary artery without angina pectoris: Secondary | ICD-10-CM

## 2021-08-09 MED ORDER — EZETIMIBE 10 MG PO TABS: 10.0000 mg | ORAL_TABLET | Freq: Every day | ORAL | 3 refills | Status: DC

## 2021-08-09 MED ORDER — AMLODIPINE BESYLATE 10 MG PO TABS: 10.0000 mg | ORAL_TABLET | Freq: Every day | ORAL | 3 refills | Status: DC

## 2021-08-09 MED ORDER — HYDROCHLOROTHIAZIDE 25 MG PO TABS: 25.0000 mg | ORAL_TABLET | Freq: Every day | ORAL | 3 refills | Status: DC

## 2021-08-09 NOTE — Addendum Note (Signed)
Addended by: Theresia Majors on: 08/09/2021 09:22 AM   Modules accepted: Orders

## 2021-08-09 NOTE — Patient Instructions (Signed)
Medication Instructions:  Your physician recommends that you continue on your current medications as directed. Please refer to the Current Medication list given to you today.  *If you need a refill on your cardiac medications before your next appointment, please call your pharmacy*   Lab Work: Fasting lipids and ALT  If you have labs (blood work) drawn today and your tests are completely normal, you will receive your results only by: . MyChart Message (if you have MyChart) OR . A paper copy in the mail If you have any lab test that is abnormal or we need to change your treatment, we will call you to review the results.   Follow-Up: At [facility], you and your health needs are our priority.  As part of our continuing mission to provide you with exceptional heart care, we have created designated Provider Care Teams.  These Care Teams include your primary Cardiologist (physician) and Advanced Practice Providers (APPs -  Physician Assistants and Nurse Practitioners) who all work together to provide you with the care you need, when you need it.  Your next appointment:   1 year(s)  The format for your next appointment:   In Person  Provider:   You may see Traci Turner, MD or one of the following Advanced Practice Providers on your designated Care Team:    Dayna Dunn, PA-C  Michele Lenze, PA-C    

## 2021-08-16 ENCOUNTER — Other Ambulatory Visit: Payer: Self-pay | Admitting: Family Medicine

## 2021-08-16 ENCOUNTER — Ambulatory Visit (INDEPENDENT_AMBULATORY_CARE_PROVIDER_SITE_OTHER): Payer: Medicare HMO | Admitting: Family Medicine

## 2021-08-16 ENCOUNTER — Other Ambulatory Visit: Payer: Self-pay

## 2021-08-16 VITALS — BP 118/62 | HR 70 | Ht 62.0 in | Wt 219.2 lb

## 2021-08-16 DIAGNOSIS — Z289 Immunization not carried out for unspecified reason: Secondary | ICD-10-CM | POA: Diagnosis not present

## 2021-08-16 DIAGNOSIS — Z23 Encounter for immunization: Secondary | ICD-10-CM | POA: Diagnosis not present

## 2021-08-16 DIAGNOSIS — L600 Ingrowing nail: Secondary | ICD-10-CM | POA: Insufficient documentation

## 2021-08-16 DIAGNOSIS — Z1211 Encounter for screening for malignant neoplasm of colon: Secondary | ICD-10-CM

## 2021-08-16 DIAGNOSIS — Z0000 Encounter for general adult medical examination without abnormal findings: Secondary | ICD-10-CM

## 2021-08-16 DIAGNOSIS — L602 Onychogryphosis: Secondary | ICD-10-CM

## 2021-08-16 DIAGNOSIS — Z2821 Immunization not carried out because of patient refusal: Secondary | ICD-10-CM

## 2021-08-16 DIAGNOSIS — Z9189 Other specified personal risk factors, not elsewhere classified: Secondary | ICD-10-CM | POA: Diagnosis not present

## 2021-08-16 MED ORDER — UREA 50 % EX CREA: TOPICAL_CREAM | CUTANEOUS | 1 refills | Status: DC

## 2021-08-16 MED ORDER — UREA 30 % EX CREA: TOPICAL_CREAM | CUTANEOUS | 0 refills | Status: DC | PRN

## 2021-08-16 NOTE — Assessment & Plan Note (Signed)
Patient declined influenza vaccination today despite counseling. She reports last time she got the flu shot, she was in bed for a week. Does not want to do it again. States she already had all the shots when she was younger, she doesn't want any more. Declined again despite discussion. Will continue to offer.

## 2021-08-16 NOTE — Progress Notes (Signed)
    SUBJECTIVE:   CHIEF COMPLAINT / HPI:   Left toe pain - 63-month duration, progressively worsening - Bilateral great toe pain on borders of nail - Worse with wearing socks and close toed shoes, improved with wearing sandals and going barefoot - Thickened toenails, patient does not member if she has been to podiatry before  Health Maintenance -Records indicate patient is due for Tdap, shingles vaccine, pneumococcal pneumonia vaccine, flu vaccine, fourth COVID booster - Patient also due for colonoscopy   PERTINENT  PMH / PSH: T2DM, CAD, persistent A. fib, GERD, lumbar degenerative disc disease, osteoarthritis rotator cuff tear HLD, obesity, restless leg syndrome  OBJECTIVE:   BP 118/62   Pulse 70   Ht 5\' 2"  (1.575 m)   Wt 219 lb 3.2 oz (99.4 kg)   SpO2 97%   BMI 40.09 kg/m   PHQ-9:  Depression screen [facility] 2/9 08/16/2021 05/04/2021 02/21/2021  Decreased Interest 2 - 3  Down, Depressed, Hopeless 0 - 0  PHQ - 2 Score 2 - 3  Altered sleeping (No Data) 3 0  Tired, decreased energy - - 2  Change in appetite (No Data) - 2  Feeling bad or failure about yourself  - 0 0  Trouble concentrating 1 - 1  Moving slowly or fidgety/restless - - 0  Suicidal thoughts - 0 0  PHQ-9 Score - - 8  Difficult doing work/chores Not difficult at all - -  Some recent data might be hidden     GAD-7: No flowsheet data found.   Physical Exam General: Awake, alert, oriented, no acute distress Respiratory: Unlabored respirations, speaking in full sentences, no respiratory distress Extremities: Moving all extremities spontaneously, bilateral toenails diffusely thickened, ingrown toenails present of bilateral great toes, longitudinal melanonychia present on many toenails and fingernails, plantar hyperpigmented macules         ASSESSMENT/PLAN:   Ingrown nail Pain and discomfort most likely secondary to lateral ingrown toenails of great toe.  Given nailbed appearance, onchomycosis is also a  possibility. - refer to podiatry - foot soaks - urea cream to area - vaseline to area  COVID-19 vaccination not done Patient due for fourth COVID shot (second booster). Clinic does not yet have newly recommended bilvalent vaccine in stock. Patient will go to her local pharmacy for the booster.   Colon cancer screening Patient declines referral to GI today for colonoscopy. Reports she received a cologuard testing packet in the mail from her insurance. She plans to do this and return the sample. We discussed that this test is every 3 years. She prefers cologuard surveillance to colonoscopy at this time.   Influenza vaccination declined Patient declined influenza vaccination today despite counseling. She reports last time she got the flu shot, she was in bed for a week. Does not want to do it again. States she already had all the shots when she was younger, she doesn't want any more. Declined again despite discussion. Will continue to offer.   Healthcare maintenance Patient today declines influenza, pneumococcal pneumonia, and shingles vaccines despite counseling. Cites previously feeling poorly after a flu vaccine. Does not want any other shots. Declines again despite discussion. Will continue to offer.      02/23/2021, MD [facility]

## 2021-08-16 NOTE — Assessment & Plan Note (Signed)
Patient due for fourth COVID shot (second booster). Clinic does not yet have newly recommended bilvalent vaccine in stock. Patient will go to her local pharmacy for the booster.

## 2021-08-16 NOTE — Assessment & Plan Note (Signed)
Patient today declines influenza, pneumococcal pneumonia, and shingles vaccines despite counseling. Cites previously feeling poorly after a flu vaccine. Does not want any other shots. Declines again despite discussion. Will continue to offer.

## 2021-08-16 NOTE — Patient Instructions (Signed)
It was wonderful to see you today. Thank you for allowing me to be a part of your care. Below is a short summary of what we discussed at your visit today:  Toe pain - Continue doing foot soaks and Epson salts - Apply urea cream around the toenail as directed to help soften the skin - Apply Vaseline over the toes again to soften - I sent a referral to podiatry (foot specialist), their office should be calling you directly to make an appointment   Please bring all of your medications to every appointment!  If you have any questions or concerns, please do not hesitate to contact us via phone or MyChart message.   Fayette Pho, MD

## 2021-08-16 NOTE — Assessment & Plan Note (Signed)
Pain and discomfort most likely secondary to lateral ingrown toenails of great toe.  Given nailbed appearance, onchomycosis is also a possibility. - refer to podiatry - foot soaks - urea cream to area - vaseline to area

## 2021-08-16 NOTE — Addendum Note (Signed)
Addended by: Valetta Close on: 08/16/2021 12:03 PM   Modules accepted: Orders

## 2021-08-16 NOTE — Assessment & Plan Note (Signed)
Patient declines referral to GI today for colonoscopy. Reports she received a cologuard testing packet in the mail from her insurance. She plans to do this and return the sample. We discussed that this test is every 3 years. She prefers cologuard surveillance to colonoscopy at this time.

## 2021-08-23 ENCOUNTER — Other Ambulatory Visit: Payer: Self-pay

## 2021-08-23 ENCOUNTER — Other Ambulatory Visit: Payer: Medicare HMO

## 2021-08-23 DIAGNOSIS — E7800 Pure hypercholesterolemia, unspecified: Secondary | ICD-10-CM | POA: Diagnosis not present

## 2021-08-23 LAB — LIPID PANEL
Chol/HDL Ratio: 2.9 ratio (ref 0.0–4.4)
Cholesterol, Total: 146 mg/dL (ref 100–199)
HDL: 51 mg/dL (ref >39)
LDL Chol Calc (NIH): 78 mg/dL (ref 0–99)
Triglycerides: 89 mg/dL (ref 0–149)
VLDL Cholesterol Cal: 17 mg/dL (ref 5–40)

## 2021-08-23 LAB — ALT: ALT: 8 IU/L (ref 0–32)

## 2021-08-30 ENCOUNTER — Other Ambulatory Visit: Payer: Self-pay

## 2021-08-30 ENCOUNTER — Encounter: Payer: Self-pay | Admitting: Sports Medicine

## 2021-08-30 ENCOUNTER — Ambulatory Visit (INDEPENDENT_AMBULATORY_CARE_PROVIDER_SITE_OTHER): Payer: Medicare HMO | Admitting: Sports Medicine

## 2021-08-30 DIAGNOSIS — M79674 Pain in right toe(s): Secondary | ICD-10-CM

## 2021-08-30 DIAGNOSIS — Z7901 Long term (current) use of anticoagulants: Secondary | ICD-10-CM

## 2021-08-30 DIAGNOSIS — B351 Tinea unguium: Secondary | ICD-10-CM | POA: Diagnosis not present

## 2021-08-30 DIAGNOSIS — M79675 Pain in left toe(s): Secondary | ICD-10-CM

## 2021-08-30 DIAGNOSIS — E119 Type 2 diabetes mellitus without complications: Secondary | ICD-10-CM

## 2021-08-30 NOTE — Progress Notes (Signed)
Subjective: Meredith [name] is a 70 y.o. female patient with history of diabetes who presents to office today complaining of long,mildly painful nails  while ambulating in shoes; unable to trim. Patient states that the glucose reading this morning was not recorded. States that her last A1c 7 and PCP visit was week when they referred her for a possible ingrown states that it was painful at the left great toe medial border but now the pain has gotten better after she soaked and put antibiotic cream to the area patient denies redness warmth swelling or drainage to the area.  Patient admits to a family history of diabetes including her mom who is diabetic and was a bilateral amputee.  Patient Active Problem List   Diagnosis Date Noted   Ingrown nail 08/16/2021   COVID-19 vaccination not done 08/16/2021   Colon cancer screening 08/16/2021   Influenza vaccination declined 08/16/2021   Tear of rotator cuff 05/04/2021   Leg pain, lateral, right 12/31/2019   Close exposure to COVID-19 virus 06/17/2019   Hypokalemia 12/04/2018   Restless leg syndrome 12/04/2018   Osteopenia 09/15/2017   Pain of both hip joints 06/13/2017   Knee pain, chronic 07/21/2016   Numbness of fingers 04/06/2016   Estrogen deficiency 03/29/2016   CAD (coronary artery disease), native coronary artery 10/26/2015   Back pain 08/17/2015   Persistent atrial fibrillation ([place]) 02/23/2015   PAC (premature atrial contraction) 01/19/2015   Atrial tachycardia, paroxysmal ([place]) 01/19/2015   Irregular heart rhythm 11/25/2014   Healthcare maintenance 03/04/2014   HERNIA, VENTRAL 04/18/2010   DEGENERATIVE DISC DISEASE, LUMBAR SPINE 04/18/2010   Diabetes mellitus type II, controlled, with no complications ([place]) 57/84/6962   HYPERCHOLESTEROLEMIA 02/05/2007   Obesity 02/05/2007   DEPRESSION, MAJOR, RECURRENT 02/05/2007   HYPERTENSION, BENIGN SYSTEMIC 02/05/2007   GASTROESOPHAGEAL REFLUX, NO ESOPHAGITIS 02/05/2007   OSTEOARTHRITIS,  MULTI SITES 02/05/2007   Current Outpatient Medications on File Prior to Visit  Medication Sig Dispense Refill   Accu-Chek Softclix Lancets lancets Use as instructed to test blood glucose once daily. ICD-10 code: E11.9 100 each 12   acetaminophen (ACETAMINOPHEN 8 HOUR) 650 MG CR tablet Take 1 tablet (650 mg total) by mouth every 8 (eight) hours. 30 tablet 0   amLODipine (NORVASC) 10 MG tablet Take 1 tablet (10 mg total) by mouth daily. 90 tablet 3   Blood Glucose Monitoring Suppl (ACCU-CHEK AVIVA PLUS) w/Device KIT 1 kit by Does not apply route as directed. ICD-10 code: E11.9 1 kit 0   canagliflozin (INVOKANA) 100 MG TABS tablet Take 1 tablet (100 mg total) by mouth daily. Take before first meal of day for four weeks. Return to doctor within 3rd or 4th week. 90 tablet 3   diclofenac sodium (VOLTAREN) 1 % GEL Apply 2 g topically 4 (four) times daily. 100 g 0   ezetimibe (ZETIA) 10 MG tablet Take 1 tablet (10 mg total) by mouth daily. 90 tablet 3   fexofenadine (ALLEGRA) 180 MG tablet Take 1 tablet (180 mg total) by mouth daily. 30 tablet 11   glucose blood (ACCU-CHEK AVIVA PLUS) test strip USE AS INSTRUCTED TEST BLOOD GLUCOSE ONCE DAILY. ICD-10 CODE: E11.9 100 each 12   hydrochlorothiazide (HYDRODIURIL) 25 MG tablet Take 1 tablet (25 mg total) by mouth daily. 90 tablet 3   Lido-Capsaicin-Men-Methyl Sal 0.5-0.035-5-20 % PTCH Apply 1 each topically daily as needed. 60 patch 3   Lidocaine (HM LIDOCAINE PATCH) 4 % PTCH Apply 1 each topically daily as needed. 15 patch 12  metFORMIN (GLUCOPHAGE) 1000 MG tablet Take 1 tablet twice daily with food. 180 tablet 3   metoprolol succinate (TOPROL-XL) 50 MG 24 hr tablet Take 1 tablet (50 mg total) by mouth daily. TAKE WITH OR IMMEDIATELY FOLLOWING A MEAL. 90 tablet 3   potassium chloride (KLOR-CON) 10 MEQ tablet TAKE 2 TABLETS BY MOUTH TWICE A DAY 360 tablet 3   rivaroxaban (XARELTO) 20 MG TABS tablet TAKE 1 TABLET BY MOUTH EVERY DAY WITH SUPPER 90 tablet 3    rosuvastatin (CRESTOR) 20 MG tablet TAKE 1 TABLET BY MOUTH EVERY DAY 90 tablet 3   traMADol (ULTRAM) 50 MG tablet Take 1 tablet (50 mg total) by mouth every 8 (eight) hours as needed. 30 tablet 0   triamcinolone ointment (KENALOG) 0.5 % Apply 1 application topically 2 (two) times daily. For moderate to severe eczema.  Do not use for more than 1 week at a time. (Patient taking differently: Apply 1 application topically 2 (two) times daily. For moderate to severe eczema.  Do not use for more than 1 week at a time.) 60 g 3   urea (GORDONS UREA) 40 % ointment Apply topically as needed. 30 g 0   No current facility-administered medications on file prior to visit.   Allergies  Allergen Reactions   Lisinopril Anaphylaxis, Shortness Of Breath and Swelling    Mouth and throat became swollen   Doxycycline Swelling    Possibly caused swelling of lips and hives in May 2019, timeline unclear   Flexeril [Cyclobenzaprine Hcl] Itching and Other (See Comments)    Welts also   Lipitor [Atorvastatin Calcium] Other (See Comments)    Myalgia   Metaxalone Itching and Other (See Comments)    Welts also     Recent Results (from the past 2160 hour(s))  ALT     Status: None   Collection Time: 08/23/21  9:57 AM  Result Value Ref Range   ALT 8 0 - 32 IU/L  Lipid panel     Status: None   Collection Time: 08/23/21  9:57 AM  Result Value Ref Range   Cholesterol, Total 146 100 - 199 mg/dL   Triglycerides 89 0 - 149 mg/dL   HDL 51 >39 mg/dL   VLDL Cholesterol Cal 17 5 - 40 mg/dL   LDL Chol Calc (NIH) 78 0 - 99 mg/dL   Chol/HDL Ratio 2.9 0.0 - 4.4 ratio    Comment:                                   T. Chol/HDL Ratio                                             Men  Women                               1/2 Avg.Risk  3.4    3.3                                   Avg.Risk  5.0    4.4  2X Avg.Risk  9.6    7.1                                3X Avg.Risk 23.4   11.0      Objective: General: Patient is awake, alert, and oriented x 3 and in no acute distress.  Integument: Skin is warm, dry and supple bilateral. Nails are tender, long, thickened and dystrophic with subungual debris, consistent with onychomycosis, 1-5 bilateral.  No acute signs of ingrowing at the left hallux.  No signs of infection. No open lesions or preulcerative lesions present bilateral. Remaining integument unremarkable.  Vasculature:  Dorsalis Pedis pulse 1/4 bilateral. Posterior Tibial pulse 1/4 bilateral.  Capillary fill time <3 sec 1-5 bilateral.  Scant positive hair growth to the level of the digits. Temperature gradient within normal limits. No varicosities present bilateral. No edema present bilateral.   Neurology: The patient has intact sensation measured with a 5.07/10g Semmes Weinstein Monofilament at all pedal sites bilateral . Vibratory sensation diminished bilateral with tuning fork. No Babinski sign present bilateral.   Musculoskeletal: No symptomatic pedal deformities noted bilateral. Muscular strength 5/5 in all lower extremity muscular groups bilateral without pain on range of motion . No tenderness with calf compression bilateral.  Assessment and Plan: Problem List Items Addressed This Visit   None Visit Diagnoses     Pain due to onychomycosis of toenails of both feet    -  Primary   Diabetes mellitus without complication ([place])       Current use of long term anticoagulation           -Examined patient. -Discussed and educated patient on diabetic foot care, especially with  regards to the vascular, neurological and musculoskeletal systems.  -Stressed the importance of good glycemic control and the detriment of not  controlling glucose levels in relation to the foot. -Mechanically debrided all nails 1-5 bilateral using sterile nail nipper and filed with dremel without incident  -Answered all patient questions -Patient to return  in 3 months for at risk foot  care -Patient advised to call the office if any problems or questions arise in the meantime.  Landis Martins, DPM

## 2021-09-06 ENCOUNTER — Telehealth: Payer: Self-pay

## 2021-09-06 DIAGNOSIS — E7800 Pure hypercholesterolemia, unspecified: Secondary | ICD-10-CM

## 2021-09-06 MED ORDER — ROSUVASTATIN CALCIUM 40 MG PO TABS: 40.0000 mg | ORAL_TABLET | Freq: Every day | ORAL | 3 refills | Status: DC

## 2021-09-06 NOTE — Telephone Encounter (Signed)
-----   Message from Quintella Reichert, MD sent at 08/23/2021  5:40 PM EDT ----- LDL not at goal - increase Crestor to 40mg  daily and repeat FLP and ALT In 6 weeks

## 2021-09-06 NOTE — Telephone Encounter (Signed)
The patient has been notified of the result and verbalized understanding.  All questions (if any) were answered. Theresia Majors, RN 09/06/2021 3:32 PM  Patient will increase crestor to 40 mg daily. Repeat lab work has been scheduled.

## 2021-09-13 ENCOUNTER — Ambulatory Visit
Admission: RE | Admit: 2021-09-13 | Discharge: 2021-09-13 | Disposition: A | Discharge status: Home / Self Care | Payer: Medicare HMO | Source: Ambulatory Visit | Attending: Family Medicine | Admitting: Family Medicine

## 2021-09-13 ENCOUNTER — Other Ambulatory Visit: Payer: Self-pay

## 2021-09-13 DIAGNOSIS — Z1231 Encounter for screening mammogram for malignant neoplasm of breast: Secondary | ICD-10-CM

## 2021-09-26 ENCOUNTER — Telehealth: Payer: Self-pay | Admitting: *Deleted

## 2021-09-26 NOTE — Telephone Encounter (Signed)
Patient is calling because her left great toe has started to sting&burn after last office visit. The cream given was too expensive.(The urea cream was not prescribed by our office, informed the patient) please schedule for a sooner appointment to be reevaluated.Please advise.

## 2021-09-26 NOTE — Telephone Encounter (Signed)
Returned the call to patient and gave recommendations of OTC cortisone cream and to make an appointment if not better in a week. She verbalized understanding.

## 2021-10-18 ENCOUNTER — Other Ambulatory Visit: Payer: Self-pay

## 2021-10-18 ENCOUNTER — Ambulatory Visit
Admission: RE | Admit: 2021-10-18 | Discharge: 2021-10-18 | Disposition: A | Discharge status: Home / Self Care | Payer: Medicare HMO | Source: Ambulatory Visit | Attending: Family Medicine | Admitting: Family Medicine

## 2021-10-18 DIAGNOSIS — Z1231 Encounter for screening mammogram for malignant neoplasm of breast: Secondary | ICD-10-CM | POA: Diagnosis not present

## 2021-10-31 ENCOUNTER — Emergency Department (HOSPITAL_COMMUNITY): Payer: Medicare HMO

## 2021-10-31 ENCOUNTER — Other Ambulatory Visit: Payer: Self-pay

## 2021-10-31 ENCOUNTER — Inpatient Hospital Stay (HOSPITAL_COMMUNITY)
Admission: EM | Admit: 2021-10-31 | Discharge: 2021-11-04 | DRG: 641 | Disposition: A | Discharge status: Home / Self Care | Payer: Medicare HMO | Attending: Family Medicine | Admitting: Family Medicine

## 2021-10-31 ENCOUNTER — Encounter (HOSPITAL_COMMUNITY): Payer: Self-pay | Admitting: Emergency Medicine

## 2021-10-31 DIAGNOSIS — I2510 Atherosclerotic heart disease of native coronary artery without angina pectoris: Secondary | ICD-10-CM | POA: Diagnosis present

## 2021-10-31 DIAGNOSIS — N182 Chronic kidney disease, stage 2 (mild): Secondary | ICD-10-CM | POA: Diagnosis present

## 2021-10-31 DIAGNOSIS — Z888 Allergy status to other drugs, medicaments and biological substances status: Secondary | ICD-10-CM

## 2021-10-31 DIAGNOSIS — I4819 Other persistent atrial fibrillation: Secondary | ICD-10-CM | POA: Diagnosis present

## 2021-10-31 DIAGNOSIS — J4520 Mild intermittent asthma, uncomplicated: Secondary | ICD-10-CM | POA: Diagnosis present

## 2021-10-31 DIAGNOSIS — M542 Cervicalgia: Secondary | ICD-10-CM | POA: Diagnosis not present

## 2021-10-31 DIAGNOSIS — R634 Abnormal weight loss: Secondary | ICD-10-CM | POA: Diagnosis not present

## 2021-10-31 DIAGNOSIS — E7800 Pure hypercholesterolemia, unspecified: Secondary | ICD-10-CM | POA: Diagnosis present

## 2021-10-31 DIAGNOSIS — I129 Hypertensive chronic kidney disease with stage 1 through stage 4 chronic kidney disease, or unspecified chronic kidney disease: Secondary | ICD-10-CM | POA: Diagnosis present

## 2021-10-31 DIAGNOSIS — F4310 Post-traumatic stress disorder, unspecified: Secondary | ICD-10-CM | POA: Diagnosis present

## 2021-10-31 DIAGNOSIS — E1122 Type 2 diabetes mellitus with diabetic chronic kidney disease: Secondary | ICD-10-CM | POA: Diagnosis present

## 2021-10-31 DIAGNOSIS — F329 Major depressive disorder, single episode, unspecified: Secondary | ICD-10-CM | POA: Diagnosis present

## 2021-10-31 DIAGNOSIS — Z87891 Personal history of nicotine dependence: Secondary | ICD-10-CM

## 2021-10-31 DIAGNOSIS — E876 Hypokalemia: Secondary | ICD-10-CM | POA: Diagnosis not present

## 2021-10-31 DIAGNOSIS — Z28310 Unvaccinated for covid-19: Secondary | ICD-10-CM

## 2021-10-31 DIAGNOSIS — Z20822 Contact with and (suspected) exposure to covid-19: Secondary | ICD-10-CM | POA: Diagnosis not present

## 2021-10-31 DIAGNOSIS — I491 Atrial premature depolarization: Secondary | ICD-10-CM | POA: Diagnosis present

## 2021-10-31 DIAGNOSIS — I471 Supraventricular tachycardia: Secondary | ICD-10-CM | POA: Diagnosis present

## 2021-10-31 DIAGNOSIS — R519 Headache, unspecified: Secondary | ICD-10-CM | POA: Diagnosis not present

## 2021-10-31 DIAGNOSIS — I10 Essential (primary) hypertension: Secondary | ICD-10-CM | POA: Diagnosis not present

## 2021-10-31 DIAGNOSIS — Z6837 Body mass index (BMI) 37.0-37.9, adult: Secondary | ICD-10-CM | POA: Diagnosis not present

## 2021-10-31 DIAGNOSIS — E669 Obesity, unspecified: Secondary | ICD-10-CM | POA: Diagnosis present

## 2021-10-31 DIAGNOSIS — M436 Torticollis: Secondary | ICD-10-CM | POA: Diagnosis not present

## 2021-10-31 DIAGNOSIS — K219 Gastro-esophageal reflux disease without esophagitis: Secondary | ICD-10-CM | POA: Diagnosis present

## 2021-10-31 DIAGNOSIS — Z7984 Long term (current) use of oral hypoglycemic drugs: Secondary | ICD-10-CM | POA: Diagnosis not present

## 2021-10-31 DIAGNOSIS — I4719 Other supraventricular tachycardia: Secondary | ICD-10-CM | POA: Diagnosis present

## 2021-10-31 DIAGNOSIS — K5900 Constipation, unspecified: Secondary | ICD-10-CM | POA: Diagnosis present

## 2021-10-31 DIAGNOSIS — Z881 Allergy status to other antibiotic agents status: Secondary | ICD-10-CM

## 2021-10-31 DIAGNOSIS — R079 Chest pain, unspecified: Secondary | ICD-10-CM

## 2021-10-31 DIAGNOSIS — Z79899 Other long term (current) drug therapy: Secondary | ICD-10-CM | POA: Diagnosis not present

## 2021-10-31 DIAGNOSIS — E860 Dehydration: Secondary | ICD-10-CM | POA: Diagnosis present

## 2021-10-31 DIAGNOSIS — N179 Acute kidney failure, unspecified: Secondary | ICD-10-CM | POA: Diagnosis present

## 2021-10-31 DIAGNOSIS — E119 Type 2 diabetes mellitus without complications: Secondary | ICD-10-CM | POA: Diagnosis not present

## 2021-10-31 DIAGNOSIS — Z7901 Long term (current) use of anticoagulants: Secondary | ICD-10-CM

## 2021-10-31 LAB — RESP PANEL BY RT-PCR (FLU A&B, COVID) ARPGX2
Influenza A by PCR: NEGATIVE
Influenza B by PCR: NEGATIVE
SARS Coronavirus 2 by RT PCR: NEGATIVE

## 2021-10-31 LAB — CBC
HCT: 41 % (ref 36.0–46.0)
Hemoglobin: 13.9 g/dL (ref 12.0–15.0)
MCH: 26.1 pg (ref 26.0–34.0)
MCHC: 33.9 g/dL (ref 30.0–36.0)
MCV: 76.9 fL — ABNORMAL LOW (ref 80.0–100.0)
Platelets: 235 10*3/uL (ref 150–400)
RBC: 5.33 MIL/uL — ABNORMAL HIGH (ref 3.87–5.11)
RDW: 13.6 % (ref 11.5–15.5)
WBC: 10.1 10*3/uL (ref 4.0–10.5)
nRBC: 0 % (ref 0.0–0.2)

## 2021-10-31 LAB — BASIC METABOLIC PANEL
Anion gap: 13 (ref 5–15)
BUN: 19 mg/dL (ref 8–23)
CO2: 29 mmol/L (ref 22–32)
Calcium: 10.2 mg/dL (ref 8.9–10.3)
Chloride: 96 mmol/L — ABNORMAL LOW (ref 98–111)
Creatinine, Ser: 2 mg/dL — ABNORMAL HIGH (ref 0.44–1.00)
GFR, Estimated: 26 mL/min — ABNORMAL LOW (ref >60)
Glucose, Bld: 150 mg/dL — ABNORMAL HIGH (ref 70–99)
Potassium: 2 mmol/L — CL (ref 3.5–5.1)
Sodium: 138 mmol/L (ref 135–145)

## 2021-10-31 LAB — TROPONIN I (HIGH SENSITIVITY)
Troponin I (High Sensitivity): <2 ng/L (ref <18)
Troponin I (High Sensitivity): 2 ng/L (ref <18)

## 2021-10-31 LAB — MAGNESIUM: Magnesium: 2.1 mg/dL (ref 1.7–2.4)

## 2021-10-31 MED ORDER — POTASSIUM CHLORIDE 10 MEQ/100ML IV SOLN
10.0000 meq | INTRAVENOUS | Status: AC
  Administered 2021-10-31 – 2021-11-01 (×3): 10 meq via INTRAVENOUS
  Filled 2021-10-31 (×2): qty 100

## 2021-10-31 MED ORDER — SODIUM CHLORIDE 0.9 % IV BOLUS
500.0000 mL | Freq: Once | INTRAVENOUS | Status: AC
  Administered 2021-10-31: 500 mL via INTRAVENOUS

## 2021-10-31 MED ORDER — MORPHINE SULFATE (PF) 2 MG/ML IV SOLN
2.0000 mg | Freq: Once | INTRAVENOUS | Status: AC
  Administered 2021-11-01: 2 mg via INTRAVENOUS

## 2021-10-31 MED ORDER — POTASSIUM CHLORIDE CRYS ER 20 MEQ PO TBCR
40.0000 meq | EXTENDED_RELEASE_TABLET | Freq: Once | ORAL | Status: AC
  Administered 2021-10-31: 40 meq via ORAL
  Filled 2021-10-31: qty 2

## 2021-10-31 MED ORDER — MORPHINE SULFATE (PF) 4 MG/ML IV SOLN
4.0000 mg | Freq: Once | INTRAVENOUS | Status: DC
  Filled 2021-10-31: qty 1

## 2021-10-31 NOTE — ED Provider Notes (Signed)
Meredith [name]   CSN: [id number] Arrival date & time: 10/31/21  1746     History Chief Complaint  Patient presents with   Neck Pain    Meredith [name] is a 70 y.o. female.  Patient with a history of atrial fibrillation on Xarelto, hypertension, diabetes, asthma presenting with a 1 day history of diffuse neck pain and bilateral shoulder and upper back pain.  Denies any trauma.  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  Symptoms started on the afternoon of November 22 have gradually progressed.  She describes pain to her bilateral paraspinal muscles culture in her neck and pain with range of motion of her head.  Pain radiates up to her head.  Denies chest pain or back pain.  No shortness of breath, cough or fever.  No runny nose or sore throat.  No focal weakness, numbness or tingling.  No history of similar problems. No abdominal pain.  No nausea or vomiting.  Denies fever.  No runny nose, sore throat or cough. She is found to be profoundly hypokalemic in triage at 2.0.  She does take potassium supplements and states compliance  The history is provided by the patient.  Neck Pain Associated symptoms: headaches   Associated symptoms: no weakness       Past Medical History:  Diagnosis Date   ANEMIA, IRON DEFICIENCY, UNSPEC. 02/05/2007   Qualifier: Diagnosis of  By: Damita Dunnings MD, Phillip Heal     ASTHMA, INTERMITTENT 09/07/2010   Qualifier: Diagnosis of  By: Jess Barters MD, Cindee Salt     CAD (coronary artery disease), native coronary artery 02/16/2015   Cath with 20% LCx and RCA   Colon polyps    CTS (carpal tunnel syndrome) 06/06/2016   Diabetes mellitus    Diverticulosis 05/17/12   DJD (degenerative joint disease) of lumbar spine    Enteritis    Gastric ulcer 04/2000   Hyperlipidemia    Hypertension    Irregular heart beat    Obesity    Osteoarthritis    Persistent atrial fibrillation ([facility])    CHADS2VASC score is 5 and on  Xarelto   Pruritus 02/21/2021   Rash 02/15/2021   Renal cyst, left 05/17/12   Ventral hernia     Patient Active Problem List   Diagnosis Date Noted   Ingrown nail 08/16/2021   COVID-19 vaccination not done 08/16/2021   Colon cancer screening 08/16/2021   Influenza vaccination declined 08/16/2021   Tear of rotator cuff 05/04/2021   Leg pain, lateral, right 12/31/2019   Close exposure to COVID-19 virus 06/17/2019   Hypokalemia 12/04/2018   Restless leg syndrome 12/04/2018   Osteopenia 09/15/2017   Pain of both hip joints 06/13/2017   Knee pain, chronic 07/21/2016   Numbness of fingers 04/06/2016   Estrogen deficiency 03/29/2016   CAD (coronary artery disease), native coronary artery 10/26/2015   Back pain 08/17/2015   Persistent atrial fibrillation ([place]) 02/23/2015   PAC (premature atrial contraction) 01/19/2015   Atrial tachycardia, paroxysmal ([place]) 01/19/2015   Irregular heart rhythm 11/25/2014   Healthcare maintenance 03/04/2014   HERNIA, VENTRAL 04/18/2010   DEGENERATIVE DISC DISEASE, LUMBAR SPINE 04/18/2010   Diabetes mellitus type II, controlled, with no complications ([place]) 79/39/0300   HYPERCHOLESTEROLEMIA 02/05/2007   Obesity 02/05/2007   DEPRESSION, MAJOR, RECURRENT 02/05/2007   HYPERTENSION, BENIGN SYSTEMIC 02/05/2007   GASTROESOPHAGEAL REFLUX, NO ESOPHAGITIS 02/05/2007   OSTEOARTHRITIS, MULTI SITES 02/05/2007    Past Surgical History:  Procedure Laterality  Date   ABDOMINAL HYSTERECTOMY     LEFT HEART CATHETERIZATION WITH CORONARY ANGIOGRAM N/A 02/16/2015   Procedure: LEFT HEART CATHETERIZATION WITH CORONARY ANGIOGRAM;  Surgeon: Burnell Blanks, MD;  Location: [facility] CATH LAB;  Service: Cardiovascular;  Laterality: N/A;   OPERATIVE HYSTEROSCOPY     TUBAL LIGATION     UTERINE FIBROID SURGERY       OB History   No obstetric history on file.     Family History  Problem Relation Age of Onset   Diabetes Mother    Kidney disease Mother    Heart disease  Mother    Lung cancer Father    Heart disease Father    Stomach cancer Sister    Clotting disorder Other        neice   Diabetes Sister    Diabetes Sister    Diabetes Sister    Diabetes Sister    Colon cancer Neg Hx     Social History   Tobacco Use   Smoking status: Former    Packs/day: 1.00    Years: 5.00    Pack years: 5.00    Types: Cigarettes    Quit date: 12/09/1978    Years since quitting: 42.9   Smokeless tobacco: Never  Vaping Use   Vaping Use: Never used  Substance Use Topics   Alcohol use: No   Drug use: No    Home Medications Prior to Admission medications   Medication Sig Start Date End Date Taking? Authorizing Provider  Accu-Chek Softclix Lancets lancets Use as instructed to test blood glucose once daily. ICD-10 code: E11.9 10/27/20   Ezequiel Essex, MD  acetaminophen (ACETAMINOPHEN 8 HOUR) 650 MG CR tablet Take 1 tablet (650 mg total) by mouth every 8 (eight) hours. 10/22/18   Mesner, Corene Cornea, MD  amLODipine (NORVASC) 10 MG tablet Take 1 tablet (10 mg total) by mouth daily. 08/09/21   Sueanne Margarita, MD  Blood Glucose Monitoring Suppl (ACCU-CHEK AVIVA PLUS) w/Device KIT 1 kit by Does not apply route as directed. ICD-10 code: E11.9 09/17/17   McDiarmid, Blane Ohara, MD  canagliflozin (INVOKANA) 100 MG TABS tablet Take 1 tablet (100 mg total) by mouth daily. Take before first meal of day for four weeks. Return to doctor within 3rd or 4th week. 01/19/21   Ezequiel Essex, MD  diclofenac sodium (VOLTAREN) 1 % GEL Apply 2 g topically 4 (four) times daily. 08/27/19   Sherene Sires, DO  ezetimibe (ZETIA) 10 MG tablet Take 1 tablet (10 mg total) by mouth daily. 08/09/21   Sueanne Margarita, MD  fexofenadine (ALLEGRA) 180 MG tablet Take 1 tablet (180 mg total) by mouth daily. 02/21/21   Mullis, Kiersten P, DO  glucose blood (ACCU-CHEK AVIVA PLUS) test strip USE AS INSTRUCTED TEST BLOOD GLUCOSE ONCE DAILY. ICD-10 CODE: E11.9 10/27/20   Ezequiel Essex, MD  hydrochlorothiazide  (HYDRODIURIL) 25 MG tablet Take 1 tablet (25 mg total) by mouth daily. 08/09/21   Sueanne Margarita, MD  Lido-Capsaicin-Men-Methyl Sal 0.5-0.035-5-20 % PTCH Apply 1 each topically daily as needed. 10/27/20   Ezequiel Essex, MD  Lidocaine (HM LIDOCAINE PATCH) 4 % PTCH Apply 1 each topically daily as needed. 10/27/20   Ezequiel Essex, MD  metFORMIN (GLUCOPHAGE) 1000 MG tablet Take 1 tablet twice daily with food. 01/11/21   Ezequiel Essex, MD  metoprolol succinate (TOPROL-XL) 50 MG 24 hr tablet Take 1 tablet (50 mg total) by mouth daily. TAKE WITH OR IMMEDIATELY FOLLOWING A MEAL. 03/26/21   Jeani Hawking,  Barnetta Chapel, MD  potassium chloride (KLOR-CON) 10 MEQ tablet TAKE 2 TABLETS BY MOUTH TWICE A DAY 03/01/20   Sueanne Margarita, MD  rivaroxaban (XARELTO) 20 MG TABS tablet TAKE 1 TABLET BY MOUTH EVERY DAY WITH SUPPER 09/15/20   Ezequiel Essex, MD  rosuvastatin (CRESTOR) 40 MG tablet Take 1 tablet (40 mg total) by mouth daily. 09/06/21 12/05/21  Sueanne Margarita, MD  traMADol (ULTRAM) 50 MG tablet Take 1 tablet (50 mg total) by mouth every 8 (eight) hours as needed. 10/27/20   Ezequiel Essex, MD  triamcinolone ointment (KENALOG) 0.5 % Apply 1 application topically 2 (two) times daily. For moderate to severe eczema.  Do not use for more than 1 week at a time. Patient taking differently: Apply 1 application topically 2 (two) times daily. For moderate to severe eczema.  Do not use for more than 1 week at a time. 02/21/21   Mullis, Kiersten P, DO  urea (GORDONS UREA) 40 % ointment Apply topically as needed. 08/16/21   Ezequiel Essex, MD    Allergies    Lisinopril, Doxycycline, Flexeril [cyclobenzaprine hcl], Lipitor [atorvastatin calcium], and Metaxalone  Review of Systems   Review of Systems  Constitutional:  Positive for fatigue. Negative for activity change and appetite change.  HENT:  Negative for congestion and rhinorrhea.   Respiratory:  Negative for chest tightness and shortness of breath.   Gastrointestinal:   Negative for abdominal pain and nausea.  Genitourinary:  Negative for dysuria and hematuria.  Musculoskeletal:  Positive for arthralgias, back pain, myalgias and neck pain.  Neurological:  Positive for light-headedness and headaches. Negative for dizziness and weakness.   all other systems are negative except as noted in the HPI and PMH.   Physical Exam Updated Vital Signs BP 127/78 (BP Location: Left Arm)   Pulse 87   Temp 98.8 F (37.1 C) (Oral)   Resp 19   Ht _0  (1.575 m)   Wt 95.7 kg   SpO2 100%   BMI 38.59 kg/m   Physical Exam Vitals and nursing [name] reviewed.  Constitutional:      General: She is not in acute distress.    Appearance: She is well-developed.  HENT:     Head: Normocephalic and atraumatic.     Mouth/Throat:     Pharynx: No oropharyngeal exudate.  Eyes:     Conjunctiva/sclera: Conjunctivae normal.     Pupils: Pupils are equal, round, and reactive to light.  Neck:     Comments: Paraspinal cervical tenderness bilaterally. No meningismus Cardiovascular:     Rate and Rhythm: Normal rate. Rhythm irregular.     Heart sounds: Normal heart sounds. No murmur heard. Pulmonary:     Effort: Pulmonary effort is normal. No respiratory distress.     Breath sounds: Normal breath sounds.  Chest:     Chest wall: No tenderness.  Abdominal:     Palpations: Abdomen is soft.     Tenderness: There is no abdominal tenderness. There is no guarding or rebound.  Musculoskeletal:        General: Tenderness present. Normal range of motion.     Cervical back: Normal range of motion.     Comments: TTP upper trapezius and rhomboids. No midline thoracic or lumbar tenderness  Skin:    General: Skin is warm.  Neurological:     Mental Status: She is alert and oriented to person, place, and time.     Cranial Nerves: No cranial nerve deficit.     Motor: No  abnormal muscle tone.     Coordination: Coordination normal.     Comments:  5/5 strength throughout. CN 2-12 intact.Equal  grip strength.   Psychiatric:        Behavior: Behavior normal.    ED Results / Procedures / Treatments   Labs (all labs ordered are listed, but only abnormal results are displayed) Labs Reviewed  BASIC METABOLIC PANEL - Abnormal; Notable for the following components:      Result Value   Potassium 2.0 (*)    Chloride 96 (*)    Glucose, Bld 150 (*)    Creatinine, Ser 2.00 (*)    GFR, Estimated 26 (*)    All other components within normal limits  CBC - Abnormal; Notable for the following components:   RBC 5.33 (*)    MCV 76.9 (*)    All other components within normal limits  BASIC METABOLIC PANEL - Abnormal; Notable for the following components:   Potassium 2.2 (*)    Glucose, Bld 143 (*)    Creatinine, Ser 1.79 (*)    GFR, Estimated 30 (*)    All other components within normal limits  CBC - Abnormal; Notable for the following components:   RBC 5.12 (*)    MCV 76.2 (*)    All other components within normal limits  HEMOGLOBIN A1C - Abnormal; Notable for the following components:   Hgb A1c MFr Bld 7.2 (*)    All other components within normal limits  GLUCOSE, CAPILLARY - Abnormal; Notable for the following components:   Glucose-Capillary 117 (*)    All other components within normal limits  GLUCOSE, CAPILLARY - Abnormal; Notable for the following components:   Glucose-Capillary 142 (*)    All other components within normal limits  RESP PANEL BY RT-PCR (FLU A&B, COVID) ARPGX2  MAGNESIUM  TSH  BASIC METABOLIC PANEL  TROPONIN I (HIGH SENSITIVITY)  TROPONIN I (HIGH SENSITIVITY)    EKG EKG Interpretation  Date/Time:  Wednesday October 31 2021 18:28:53 EST Ventricular Rate:  87 PR Interval:    QRS Duration: 106 QT Interval:  400 QTC Calculation: 481 R Axis:   -23 Text Interpretation: Atrial fibrillation Moderate voltage criteria for LVH, may be normal variant ( R in aVL , Cornell product ) Septal infarct , age undetermined Marked ST abnormality, possible lateral  subendocardial injury Abnormal ECG Irregular rhythm Confirmed by Ezequiel Essex [id number]) on 10/31/2021 11:00:48 PM  Radiology DG Chest 2 View  Result Date: 10/31/2021 CLINICAL DATA:  Chest pain. EXAM: CHEST - 2 VIEW COMPARISON:  Chest radiograph dated 02/14/2015. FINDINGS: No focal consolidation, pleural effusion or pneumothorax. The cardiac silhouette is within limits. No acute osseous pathology. IMPRESSION: No active cardiopulmonary disease. Electronically Signed   By: Anner Crete M.D.   On: 10/31/2021 21:06    Procedures .Critical Care Performed by: Ezequiel Essex, MD Authorized by: Ezequiel Essex, MD   Critical care provider statement:    Critical care time (minutes):  45   Critical care was necessary to treat or prevent imminent or life-threatening deterioration of the following conditions:  Metabolic crisis and dehydration   Critical care was time spent personally by me on the following activities:  Development of treatment plan with patient or surrogate, discussions with consultants, evaluation of patient's response to treatment, examination of patient, ordering and review of laboratory studies, ordering and review of radiographic studies, ordering and performing treatments and interventions, pulse oximetry, re-evaluation of patient's condition and review of old charts   Medications Ordered in  ED Medications  potassium chloride SA (KLOR-CON) CR tablet 40 mEq (has no administration in time range)  potassium chloride 10 mEq in 100 mL IVPB (10 mEq Intravenous New Bag/Given 10/31/21 2321)  morphine 4 MG/ML injection 4 mg (has no administration in time range)  sodium chloride 0.9 % bolus 500 mL (500 mLs Intravenous New Bag/Given 10/31/21 2320)    ED Course  I have reviewed the triage vital signs and the nursing notes.  Pertinent labs & imaging results that were available during my care of the patient were reviewed by me and considered in my medical decision making (see chart  for details).    MDM Rules/Calculators/A&P                           Atraumatic neck pain for the past 2 days.  Neurologically intact.  No fever.  No chest pain.  Found to be profoundly hypokalemic at 2.0.  Magnesium is normal.  Potassium will be repleted.  EKG shows a irregular rhythm with ST changes She does take diuretics at home as well as potassium supplements.  Acute renal failure as well with creatinine up to 2.0  Will prefer to obtain CT imaging given patient's neck pain with reduced range of motion.  Her creatinine is elevated and GFR is decreased.  Cannot receive IV contrast.  CT head and C-spine are obtained which are normal and show no evidence of hemorrhage.  Suspect her hypokalemia is likely contributed to her neck pain and spasm.  This was replaced with IV and p.o. potassium.  Magnesium is normal. COVID testing is negative.  Given elevated creatinine, MRI will be obtained for further assessment of her vasculature.  To evaluate for carotid or vertebral artery dissection Unable to perform lumbar puncture due to anticoagulation use but low suspicion for meningitis  Admission discussed with family practice residents Final Clinical Impression(s) / ED Diagnoses Final diagnoses:  Hypokalemia  Torticollis, acute    Rx / DC Orders ED Discharge Orders     None        Garlen Reinig, Annie Main, MD 11/01/21 [phone]

## 2021-10-31 NOTE — ED Notes (Signed)
Patient transported to CT 

## 2021-10-31 NOTE — ED Triage Notes (Signed)
Patient states while at work today started having neck and upper back pain. Patient also having posterior head pain onset of last night. Denies any injury but reports repetitive movement at work. Has a patch on upper back from a coworker-unsure what type of patch.

## 2021-10-31 NOTE — ED Provider Notes (Signed)
Emergency Medicine Provider Triage Evaluation Note  Meredith [name] , a 70 y.o. female  was evaluated in triage.  Pt complains of neck pain, bilat shoulder and upper back pain.  Review of Systems  Positive: neck pain, bilat shoulder and upper back pain. Negative: sob  Physical Exam  BP 122/77 (BP Location: Right Arm)   Pulse 88   Temp 99.1 F (37.3 C) (Oral)   Resp 19   Ht 5\' 2"  (1.575 m)   Wt 95.7 kg   SpO2 100%   BMI 38.59 kg/m  Gen:   Awake, no distress   Resp:  Normal effort  MSK:   Moves extremities without difficulty  Other:    Medical Decision Making  Medically screening exam initiated at 7:25 PM.  Appropriate orders placed.  Meredith [name] was informed that the remainder of the evaluation will be completed by another provider, this initial triage assessment does not replace that evaluation, and the importance of remaining in the ED until their evaluation is complete.     Jonette Mate 10/31/21 1926    11/02/21, MD 10/31/21 2000

## 2021-11-01 ENCOUNTER — Observation Stay (HOSPITAL_BASED_OUTPATIENT_CLINIC_OR_DEPARTMENT_OTHER): Payer: Medicare HMO

## 2021-11-01 ENCOUNTER — Encounter (HOSPITAL_COMMUNITY): Payer: Self-pay | Admitting: Student

## 2021-11-01 ENCOUNTER — Observation Stay (HOSPITAL_COMMUNITY): Payer: Medicare HMO

## 2021-11-01 DIAGNOSIS — E876 Hypokalemia: Principal | ICD-10-CM

## 2021-11-01 DIAGNOSIS — M542 Cervicalgia: Secondary | ICD-10-CM

## 2021-11-01 DIAGNOSIS — R519 Headache, unspecified: Secondary | ICD-10-CM | POA: Diagnosis not present

## 2021-11-01 LAB — GLUCOSE, CAPILLARY
Glucose-Capillary: 117 mg/dL — ABNORMAL HIGH (ref 70–99)
Glucose-Capillary: 142 mg/dL — ABNORMAL HIGH (ref 70–99)
Glucose-Capillary: 127 mg/dL — ABNORMAL HIGH (ref 70–99)
Glucose-Capillary: 144 mg/dL — ABNORMAL HIGH (ref 70–99)
Glucose-Capillary: 132 mg/dL — ABNORMAL HIGH (ref 70–99)

## 2021-11-01 LAB — RENAL FUNCTION PANEL
Albumin: 3 g/dL — ABNORMAL LOW (ref 3.5–5.0)
Anion gap: 9 (ref 5–15)
BUN: 13 mg/dL (ref 8–23)
CO2: 25 mmol/L (ref 22–32)
Calcium: 9.2 mg/dL (ref 8.9–10.3)
Chloride: 103 mmol/L (ref 98–111)
Creatinine, Ser: 1.53 mg/dL — ABNORMAL HIGH (ref 0.44–1.00)
GFR, Estimated: 36 mL/min — ABNORMAL LOW (ref >60)
Glucose, Bld: 155 mg/dL — ABNORMAL HIGH (ref 70–99)
Phosphorus: 1.4 mg/dL — ABNORMAL LOW (ref 2.5–4.6)
Potassium: 3 mmol/L — ABNORMAL LOW (ref 3.5–5.1)
Sodium: 137 mmol/L (ref 135–145)

## 2021-11-01 LAB — BASIC METABOLIC PANEL
Anion gap: 11 (ref 5–15)
Anion gap: 8 (ref 5–15)
BUN: 15 mg/dL (ref 8–23)
BUN: 14 mg/dL (ref 8–23)
CO2: 28 mmol/L (ref 22–32)
CO2: 28 mmol/L (ref 22–32)
Calcium: 9.7 mg/dL (ref 8.9–10.3)
Calcium: 9.5 mg/dL (ref 8.9–10.3)
Chloride: 99 mmol/L (ref 98–111)
Chloride: 102 mmol/L (ref 98–111)
Creatinine, Ser: 1.79 mg/dL — ABNORMAL HIGH (ref 0.44–1.00)
Creatinine, Ser: 1.66 mg/dL — ABNORMAL HIGH (ref 0.44–1.00)
GFR, Estimated: 30 mL/min — ABNORMAL LOW (ref >60)
GFR, Estimated: 33 mL/min — ABNORMAL LOW (ref >60)
Glucose, Bld: 143 mg/dL — ABNORMAL HIGH (ref 70–99)
Glucose, Bld: 135 mg/dL — ABNORMAL HIGH (ref 70–99)
Potassium: 2.2 mmol/L — CL (ref 3.5–5.1)
Potassium: 2.8 mmol/L — ABNORMAL LOW (ref 3.5–5.1)
Sodium: 138 mmol/L (ref 135–145)
Sodium: 138 mmol/L (ref 135–145)

## 2021-11-01 LAB — CBC
HCT: 39 % (ref 36.0–46.0)
Hemoglobin: 13.4 g/dL (ref 12.0–15.0)
MCH: 26.2 pg (ref 26.0–34.0)
MCHC: 34.4 g/dL (ref 30.0–36.0)
MCV: 76.2 fL — ABNORMAL LOW (ref 80.0–100.0)
Platelets: 190 10*3/uL (ref 150–400)
RBC: 5.12 MIL/uL — ABNORMAL HIGH (ref 3.87–5.11)
RDW: 13.5 % (ref 11.5–15.5)
WBC: 8 10*3/uL (ref 4.0–10.5)
nRBC: 0 % (ref 0.0–0.2)

## 2021-11-01 LAB — TSH: TSH: 2.26 u[IU]/mL (ref 0.350–4.500)

## 2021-11-01 LAB — HEMOGLOBIN A1C
Hgb A1c MFr Bld: 7.2 % — ABNORMAL HIGH (ref 4.8–5.6)
Mean Plasma Glucose: 159.94 mg/dL

## 2021-11-01 MED ORDER — ACETAMINOPHEN 325 MG PO TABS
650.0000 mg | ORAL_TABLET | Freq: Four times a day (QID) | ORAL | Status: DC | PRN
  Administered 2021-11-01 – 2021-11-03 (×5): 650 mg via ORAL
  Filled 2021-11-01 (×5): qty 2

## 2021-11-01 MED ORDER — POLYETHYLENE GLYCOL 3350 17 G PO PACK: 17.0000 g | PACK | Freq: Every day | ORAL | Status: DC | PRN

## 2021-11-01 MED ORDER — POTASSIUM CHLORIDE CRYS ER 20 MEQ PO TBCR
40.0000 meq | EXTENDED_RELEASE_TABLET | Freq: Once | ORAL | Status: AC
  Administered 2021-11-01: 40 meq via ORAL
  Filled 2021-11-01: qty 2

## 2021-11-01 MED ORDER — RIVAROXABAN 20 MG PO TABS: 20.0000 mg | ORAL_TABLET | Freq: Every day | ORAL | Status: DC

## 2021-11-01 MED ORDER — POTASSIUM CHLORIDE 2 MEQ/ML IV SOLN
INTRAVENOUS | Status: AC
  Filled 2021-11-01 (×5): qty 1000

## 2021-11-01 MED ORDER — GADOBUTROL 1 MMOL/ML IV SOLN
10.0000 mL | Freq: Once | INTRAVENOUS | Status: AC | PRN
  Administered 2021-11-01: 10 mL via INTRAVENOUS

## 2021-11-01 MED ORDER — EZETIMIBE 10 MG PO TABS
10.0000 mg | ORAL_TABLET | Freq: Every day | ORAL | Status: DC
  Administered 2021-11-01 – 2021-11-04 (×4): 10 mg via ORAL
  Filled 2021-11-01 (×4): qty 1

## 2021-11-01 MED ORDER — METOPROLOL SUCCINATE ER 50 MG PO TB24
50.0000 mg | ORAL_TABLET | Freq: Every day | ORAL | Status: DC
  Administered 2021-11-01 – 2021-11-04 (×4): 50 mg via ORAL
  Filled 2021-11-01 (×4): qty 1

## 2021-11-01 MED ORDER — ACETAMINOPHEN 650 MG RE SUPP: 650.0000 mg | Freq: Four times a day (QID) | RECTAL | Status: DC | PRN

## 2021-11-01 MED ORDER — POTASSIUM CHLORIDE CRYS ER 20 MEQ PO TBCR
40.0000 meq | EXTENDED_RELEASE_TABLET | Freq: Three times a day (TID) | ORAL | Status: DC
  Administered 2021-11-01 – 2021-11-02 (×3): 40 meq via ORAL
  Filled 2021-11-01 (×3): qty 2

## 2021-11-01 MED ORDER — AMLODIPINE BESYLATE 10 MG PO TABS
10.0000 mg | ORAL_TABLET | Freq: Every day | ORAL | Status: DC
  Administered 2021-11-01 – 2021-11-04 (×4): 10 mg via ORAL
  Filled 2021-11-01 (×4): qty 1

## 2021-11-01 MED ORDER — ROSUVASTATIN CALCIUM 20 MG PO TABS
20.0000 mg | ORAL_TABLET | Freq: Every day | ORAL | Status: DC
  Administered 2021-11-01 – 2021-11-03 (×3): 20 mg via ORAL
  Filled 2021-11-01 (×2): qty 1
  Filled 2021-11-01: qty 4

## 2021-11-01 MED ORDER — DICLOFENAC SODIUM 1 % EX GEL
2.0000 g | Freq: Four times a day (QID) | CUTANEOUS | Status: DC | PRN
  Administered 2021-11-01 – 2021-11-04 (×5): 2 g via TOPICAL
  Filled 2021-11-01: qty 100

## 2021-11-01 MED ORDER — RIVAROXABAN 15 MG PO TABS
15.0000 mg | ORAL_TABLET | Freq: Every day | ORAL | Status: DC
  Administered 2021-11-01: 15 mg via ORAL
  Filled 2021-11-01: qty 1

## 2021-11-01 MED ORDER — LIDOCAINE 5 % EX PTCH
1.0000 | MEDICATED_PATCH | CUTANEOUS | Status: DC
  Administered 2021-11-01 – 2021-11-04 (×4): 1 via TRANSDERMAL
  Filled 2021-11-01 (×4): qty 1

## 2021-11-01 MED ORDER — ROSUVASTATIN CALCIUM 5 MG PO TABS: 10.0000 mg | ORAL_TABLET | Freq: Every day | ORAL | Status: DC

## 2021-11-01 MED ORDER — ASPIRIN EC 81 MG PO TBEC
81.0000 mg | DELAYED_RELEASE_TABLET | Freq: Every day | ORAL | Status: DC
  Administered 2021-11-01 – 2021-11-02 (×2): 81 mg via ORAL
  Filled 2021-11-01 (×2): qty 1

## 2021-11-01 MED ORDER — INSULIN ASPART 100 UNIT/ML IJ SOLN
0.0000 [IU] | Freq: Three times a day (TID) | INTRAMUSCULAR | Status: DC
  Administered 2021-11-01 – 2021-11-04 (×2): 1 [IU] via SUBCUTANEOUS

## 2021-11-01 NOTE — Progress Notes (Addendum)
Dr. Cyndie Chime paged about pt's potassium of 2.2.   0622- New orders received to give pt Potassium 40 meq PO. BMP ordered for afternoon as well.

## 2021-11-01 NOTE — H&P (Addendum)
[facility] Admission History and Physical Service Pager: [phone]  Patient name: Meredith [name] Medical record number: [id number] Date of birth: 08-10-1951 Age: 70 y.o. Gender: female  Primary Care Provider: Ezequiel Essex, MD Consultants: None Code Status: Full Code  Preferred Emergency Contact: Jefm Petty: [phone]  Chief Complaint: Bilateral shoulders, upper back, and neck pains  Assessment and Plan: Meredith [name] is a 70 y.o. female presenting with bilateral proximal msk pain and found to be severely hypokalemic and in Afib. PMHx is significant for persistent Afib (on xarelto), T2DM (A1c 7.3 in 04/2021), CAD (s/p cath 2016), HTN, HLD, GERD, MDD, PTSD.   Myalgias 2/2 Severe Hypokalemia Patient presented with complaints of proximal, bilateral upper extremity and back/neck MSK pain x1 week ago. Patient denies falls or traumatic events. Also denies associating weakness. Low suspicion for meningitis, as patient is afebrile, neck is soft, and has full range of motion. CT head and neck showed no acute abnormalities, just multilevel degenerative changes in the cervical spine.  Patient had MSK tenderness on palpation and questionable midline tenderness on cervical spine.  However CT head and neck was reassuring, showed no fractures.  In May 2022, she slipped and fell at work and was found to have chronically torn rotator cuff on CT in the ED, which may be contributing to her current pain.  Also considering cerebral and vertebral aneurysm, in the setting of chronic neck and head pain.  During encounter, she denied headaches, dizziness, blurry vision, or any change in vision at the time.  ED ordered MR angio head and neck to rule out cerebral and vertebral aneurysm. Suspect pain is 2/2 severe hypokalemia from dehydration/over-diuresis in the setting of poor p.o. intake, home HCTZ, and unclear med compliance.  Patient also has a history of hypokalemia  dating back to 2009, however this is the lowest that has been. K+ 2.0, Mg2+ 2.1, blood glucose 150 on CMP on admission.  She endorses med compliance, however during encounter, she forgot whether or not she took her medicines this AM.  She denies significant diarrhea or vomiting.  Stated that she did get nauseous prior to admission and vomited about 2 to 3 tablespoons that was white milky.  Received K+ 40 mg PO and IV K+ 10 mEq x 3 runs for 70 mEq total in the ED. Will admit for observation and electrolyte repletion.  Admitted to [place], under attending Dr. Erin Hearing 1 L LR + K+ 40 mEq at 135 mL/h for 12 hours Follow-up BMP Follow-up MR angio head and neck Lidoderm patch K pad Tyelnol 650 mg a6h PRN mild pain Voltaren gel PRN pain  Hold home HCTZ  Non-oliguric AKI on CKD Stage 2   Suspect pre-renal etiology. Cr (!) 2.00 with baseline around 0.9-1.1. Suspect 2/2 poor p.o. intake and dehydration/over diuresis per above. Received 500 mL NS bolus in the ED and started on maintenance IVF. Follow-up BMP IVF per above Hold HCTZ as above  Strict I/O   Persistent Atrial Fibrillation (on Xarelto)  Hx of atrial tachycardia  Patient is in rate-controlled A. fib on admission, CHADSVASc 5, and currently on Xarelto. Follows with Dr. Radford Pax with [facility], last visit was on 9/1.  Continue home Xarelto 20 mg daily Continue home metoprolol succinate 50 mg daily Continue cardiac monitoring   HTN: Chronic, stable SBP 120-130s, DBP 60-70s.  Home med includes amlodipine 10 mg daily, HCTZ 25 mg daily.  Will hold home HCTZ in the setting of severe  hypokalemia.  Given her history of longstanding hypokalemia, she may benefit from a potassium sparing diuretic such as spironolactone in place of her HCTZ moving forward. Hold home HCTZ 25 mg daily Continue home amlodipine 10 mg daily Monitor BP  Consider addition of aldosterone receptor antagonist   MDD  PTSD  Unintentional Weight Loss  Patient reported  unintentional weight loss and decreased appetite. Colonoscopy in 2001 only showed 4 benign polyps.  Per chart, patient is a former smoker, father had lung cancer, sister has stomach cancer.  Hb 13.9, low suspicion of malignancy at this time.  Her presentation is more consistent with MDD and PTSD given her multiple traumas (son was murdered, her nephew recently passed).  She endorses anhedonia, insomnia, feelings of guilt, decreased energy, difficulty concentrating, decreased appetite, tearfulness, recurrent flashbacks of past traumas (multiple traumatic deaths of family members), hypervigilance, and recent AVH of recently deceased family member (died about a month ago).  Discussed with patient SSRI, initially hesitant, however said that she would think about it. Consider continuing discussion about starting SSRIs Consider outpatient therapy Consider chaplain consult if patient amenable  T2DM (A1c 7.3 in 04/2021) Home medications include Invokana 100 mg daily and metformin 1000 mg twice daily.  Holding SGLT-2 in the setting of dehydration and AKI. Will hold metformin while inpatient as well.  CBG AC and qHS  Sensitive SSI without HS coverage Follow-up A1c  CAD s/p cath 2016  HLD On 08/23/2021 LDL 78, HDL 51, triglycerides 89.  She has a history of statin intolerance though is reportedly on Crestor 20 mg daily and ezetimibe 10 mg daily. Continue home rosuvastatin 20 mg qHS Continue home ezetimibe 10 mg daily  FEN/GI: Heart healthy/carb modified Prophylaxis: Rivaroxaban (xarelto) tablet 20 mg start: 11/01/21 1700   Disposition:  Cardiac telemetry   History of Present Illness:  Meredith [name] is a 70 y.o. female presenting with muscle aches in her neck and shoulders.   The pain started yesterday, "I was just doing my work and then it just started shooting heavy". The pain goes across her neck and shoulder. States that she couldn't lift up her arm due to pain.   She works at [employer]. 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   She states "I try" when asked if she takes her medications regularly. She takes 2 potassium supplements in the morning and two at night. She notes normal bowel movements, denies constipation but sometimes has a "runny" bowel movement "sometimes". She has bowel movements about every week. Admits to decreased appetite. "I don't eat like I used to, that's a good sign". "I used to weight 245, I'm down to 215. I ain't tryna lose it like that, not eating". She is not sure of the timeline of when she has lost this weight.  She cannot remember if she took her medications today. She feels that she has had difficulty remembering things and concentrating recently. She now reports that her head and shoulder started hurting last week but she "didn't pay it no attention".   "I drink plenty of water", "I drank half a case in 3-days".   She vomited "today and yesterday, not too much because I wasn't eating that much". "I was scared to eat". The vomit looked slimy and white both times.   "I don't like to be sick". She has taken Tylenol  twice this week. Denies falls or trauma to back. "It's in my muscles".   States that she just lost a nephew last month, "they were racing and my nephew got burned up in the car and I don't want to talk no more".   ED Course: VSS. K+ 2.0, Mg2+ 2.1. CBC unremarkable, Troponins flat. CT head and spine showed no acute processes. EKG showed A. fib, QTc 481.  Received morphine 2 mg, K+ 70 mEq total and 500 mL NS bolus.  .  Review Of Systems: Per HPI with the following additions:   Review of Systems  Constitutional:  Positive for appetite change, fatigue and unexpected weight change. Negative for fever.  Eyes:        Blurry vision, "sometimes"  Respiratory:  Negative for cough and shortness of breath.   Cardiovascular:  Negative for  chest pain, palpitations and leg swelling.  Gastrointestinal:  Positive for diarrhea, nausea and vomiting. Negative for abdominal pain and constipation.  Genitourinary:  Positive for frequency. Negative for dysuria and hematuria.  Musculoskeletal:  Positive for arthralgias and gait problem.  Skin:  Negative for rash and wound.  Neurological:  Positive for headaches. Negative for dizziness and weakness.    Patient Active Problem List   Diagnosis Date Noted   Ingrown nail 08/16/2021   COVID-19 vaccination not done 08/16/2021   Colon cancer screening 08/16/2021   Influenza vaccination declined 08/16/2021   Tear of rotator cuff 05/04/2021   Leg pain, lateral, right 12/31/2019   Close exposure to COVID-19 virus 06/17/2019   Hypokalemia 12/04/2018   Restless leg syndrome 12/04/2018   Osteopenia 09/15/2017   Pain of both hip joints 06/13/2017   Knee pain, chronic 07/21/2016   Numbness of fingers 04/06/2016   Estrogen deficiency 03/29/2016   CAD (coronary artery disease), native coronary artery 10/26/2015   Back pain 08/17/2015   Persistent atrial fibrillation ([place]) 02/23/2015   PAC (premature atrial contraction) 01/19/2015   Healthcare maintenance 03/04/2014   HERNIA, VENTRAL 04/18/2010   DEGENERATIVE [place] DISEASE, LUMBAR SPINE 04/18/2010   Diabetes mellitus type II, controlled, with no complications ([place]) 30/06/6225   HYPERCHOLESTEROLEMIA 02/05/2007   Obesity 02/05/2007   DEPRESSION, MAJOR, RECURRENT 02/05/2007   HYPERTENSION, BENIGN SYSTEMIC 02/05/2007   GASTROESOPHAGEAL REFLUX, NO ESOPHAGITIS 02/05/2007   OSTEOARTHRITIS, MULTI SITES 02/05/2007    Past Medical History: Past Medical History:  Diagnosis Date   ANEMIA, IRON DEFICIENCY, UNSPEC. 02/05/2007   Qualifier: Diagnosis of  By: Damita Dunnings MD, Phillip Heal     ASTHMA, INTERMITTENT 09/07/2010   Qualifier: Diagnosis of  By: Jess Barters MD, Erik     CAD (coronary artery disease), native coronary artery 02/16/2015   Cath with 20% LCx and RCA    Colon polyps    CTS (carpal tunnel syndrome) 06/06/2016   Diabetes mellitus    Diverticulosis 05/17/12   DJD (degenerative joint disease) of lumbar spine    Enteritis    Gastric ulcer 04/2000   Hyperlipidemia    Hypertension    Irregular heart beat    Obesity    Osteoarthritis    Persistent atrial fibrillation (HCC)    CHADS2VASC score is 5 and on Xarelto   Pruritus 02/21/2021   Rash 02/15/2021   Renal cyst, left 05/17/12   Ventral hernia     Past Surgical History: Past Surgical History:  Procedure Laterality Date   ABDOMINAL HYSTERECTOMY     LEFT HEART CATHETERIZATION WITH CORONARY ANGIOGRAM N/A 02/16/2015   Procedure: LEFT HEART CATHETERIZATION WITH CORONARY ANGIOGRAM;  Surgeon: Harrell Gave  Santina Evans, MD;  Location: [facility];  Service: Cardiovascular;  Laterality: N/A;   OPERATIVE HYSTEROSCOPY     TUBAL LIGATION     UTERINE FIBROID SURGERY      Social History: Social History   Tobacco Use   Smoking status: Former    Packs/day: 1.00    Years: 5.00    Pack years: 5.00    Types: Cigarettes    Quit date: 12/09/1978    Years since quitting: 42.9   Smokeless tobacco: Never  Vaping Use   Vaping Use: Never used  Substance Use Topics   Alcohol use: No   Drug use: No   Additional social history: Denies smoking and drinking.   Please also refer to relevant sections of EMR.  Family History: Family History  Problem Relation Age of Onset   Diabetes Mother    Kidney disease Mother    Heart disease Mother    Lung cancer Father    Heart disease Father    Stomach cancer Sister    Clotting disorder Other        neice   Diabetes Sister    Diabetes Sister    Diabetes Sister    Diabetes Sister    Colon cancer Neg Hx     Allergies and Medications: Allergies  Allergen Reactions   Lisinopril Anaphylaxis, Shortness Of Breath and Swelling    Mouth and throat became swollen   Doxycycline Swelling    Possibly caused swelling of lips and hives in May 2019, timeline unclear    Flexeril [Cyclobenzaprine Hcl] Itching and Other (See Comments)    Welts also   Lipitor [Atorvastatin Calcium] Other (See Comments)    Myalgia   Metaxalone Itching and Other (See Comments)    Welts also    No current facility-administered medications on file prior to encounter.   Current Outpatient Medications on File Prior to Encounter  Medication Sig Dispense Refill   Accu-Chek Softclix Lancets lancets Use as instructed to test blood glucose once daily. ICD-10 code: E11.9 100 each 12   acetaminophen (ACETAMINOPHEN 8 HOUR) 650 MG CR tablet Take 1 tablet (650 mg total) by mouth every 8 (eight) hours. (Patient taking differently: Take 650 mg by mouth every 8 (eight) hours as needed for pain.) 30 tablet 0   amLODipine (NORVASC) 10 MG tablet Take 1 tablet (10 mg total) by mouth daily. 90 tablet 3   Blood Glucose Monitoring Suppl (ACCU-CHEK AVIVA PLUS) w/Device KIT 1 kit by Does not apply route as directed. ICD-10 code: E11.9 1 kit 0   canagliflozin (INVOKANA) 100 MG TABS tablet Take 1 tablet (100 mg total) by mouth daily. Take before first meal of day for four weeks. Return to doctor within 3rd or 4th week. (Patient taking differently: Take 100 mg by mouth daily.) 90 tablet 3   diclofenac sodium (VOLTAREN) 1 % GEL Apply 2 g topically 4 (four) times daily. (Patient taking differently: Apply 2 g topically 4 (four) times daily as needed (knee pain).) 100 g 0   ezetimibe (ZETIA) 10 MG tablet Take 1 tablet (10 mg total) by mouth daily. 90 tablet 3   fexofenadine (ALLEGRA) 180 MG tablet Take 1 tablet (180 mg total) by mouth daily. (Patient taking differently: Take 180 mg by mouth daily as needed for allergies.) 30 tablet 11   glucose blood (ACCU-CHEK AVIVA PLUS) test strip USE AS INSTRUCTED TEST BLOOD GLUCOSE ONCE DAILY. ICD-10 CODE: E11.9 100 each 12   hydrochlorothiazide (HYDRODIURIL) 25 MG tablet Take 1  tablet (25 mg total) by mouth daily. 90 tablet 3   Lido-Capsaicin-Men-Methyl Sal 0.5-0.035-5-20 %  PTCH Apply 1 each topically daily as needed. (Patient taking differently: Apply 1 each topically daily as needed (pain).) 60 patch 3   Lidocaine (HM LIDOCAINE PATCH) 4 % PTCH Apply 1 each topically daily as needed. (Patient taking differently: Apply 1 each topically daily as needed (pain).) 15 patch 12   metFORMIN (GLUCOPHAGE) 1000 MG tablet Take 1 tablet twice daily with food. (Patient taking differently: Take 1,000 mg by mouth 2 (two) times daily with a meal. Take 1 tablet twice daily with food.) 180 tablet 3   metoprolol succinate (TOPROL-XL) 50 MG 24 hr tablet Take 1 tablet (50 mg total) by mouth daily. TAKE WITH OR IMMEDIATELY FOLLOWING A MEAL. 90 tablet 3   potassium chloride (KLOR-CON) 10 MEQ tablet TAKE 2 TABLETS BY MOUTH TWICE A DAY (Patient taking differently: 20 mEq 2 (two) times daily.) 360 tablet 3   rosuvastatin (CRESTOR) 40 MG tablet Take 1 tablet (40 mg total) by mouth daily. (Patient taking differently: Take 40 mg by mouth at bedtime.) 90 tablet 3   triamcinolone ointment (KENALOG) 0.5 % Apply 1 application topically 2 (two) times daily. For moderate to severe eczema.  Do not use for more than 1 week at a time. (Patient taking differently: Apply 1 application topically 2 (two) times daily. For moderate to severe eczema.  Do not use for more than 1 week at a time.) 60 g 3   urea (GORDONS UREA) 40 % ointment Apply topically as needed. (Patient taking differently: Apply 1 application topically as needed (itching).) 30 g 0   rivaroxaban (XARELTO) 20 MG TABS tablet TAKE 1 TABLET BY MOUTH EVERY DAY WITH SUPPER (Patient taking differently: 20 mg daily with supper.) 90 tablet 3   traMADol (ULTRAM) 50 MG tablet Take 1 tablet (50 mg total) by mouth every 8 (eight) hours as needed. (Patient not taking: Reported on 11/01/2021) 30 tablet 0    Objective: BP 129/68   Pulse 85   Temp 99.7 F (37.6 C) (Rectal)   Resp 15   Ht _0  (1.575 m)   Wt 95.7 kg   SpO2 100%   BMI 38.59 kg/m   Exam: Physical Exam Vitals and nursing note reviewed.  HENT:     Head: Normocephalic and atraumatic.     Nose: Nose normal.     Mouth/Throat:     Mouth: Mucous membranes are dry.     Pharynx: Oropharynx is clear.  Eyes:     Extraocular Movements: Extraocular movements intact.  Cardiovascular:     Rate and Rhythm: Normal rate. Rhythm irregular.     Heart sounds: No murmur heard.   No friction rub. No gallop.  Pulmonary:     Effort: Pulmonary effort is normal. No respiratory distress.     Breath sounds: No stridor. No wheezing, rhonchi or rales.  Abdominal:     General: There is no distension.  Musculoskeletal:        General: No tenderness or deformity.     Cervical back: Normal range of motion and neck supple. No rigidity.     Right lower leg: No edema.     Left lower leg: No edema.  Skin:    General: Skin is warm and dry.     Findings: No bruising or lesion.  Neurological:     Mental Status: She is alert and oriented to person, place, and time.  Psychiatric:  Comments: Mood and affect tearful and stoic and depressed    Labs and Imaging: CBC BMET  Recent Labs  Lab 10/31/21 1934  WBC 10.1  HGB 13.9  HCT 41.0  PLT 235   Recent Labs  Lab 10/31/21 1934  NA 138  K 2.0*  CL 96*  CO2 29  BUN 19  CREATININE 2.00*  GLUCOSE 150*  CALCIUM 10.2     EKG showed A. Fib, rate 87, QTc 481. Diffuse flattening of ST segments and T-wave inversion in AVL  DG Chest 2 View  Result Date: 10/31/2021 CLINICAL DATA:  Chest pain. EXAM: CHEST - 2 VIEW COMPARISON:  Chest radiograph dated 02/14/2015. FINDINGS: No focal consolidation, pleural effusion or pneumothorax. The cardiac silhouette is within limits. No acute osseous pathology. IMPRESSION: No active cardiopulmonary disease. Electronically Signed   By: Anner Crete M.D.   On: 10/31/2021 21:06   CT Head Wo Contrast  Result Date: 10/31/2021 CLINICAL DATA:  Fall 3 months ago with persistent headaches and neck pain,  initial encounter EXAM: CT HEAD WITHOUT CONTRAST CT CERVICAL SPINE WITHOUT CONTRAST TECHNIQUE: Multidetector CT imaging of the head and cervical spine was performed following the standard protocol without intravenous contrast. Multiplanar CT image reconstructions of the cervical spine were also generated. COMPARISON:  None. FINDINGS: CT HEAD FINDINGS Brain: No evidence of acute infarction, hemorrhage, hydrocephalus, extra-axial collection or mass lesion/mass effect. Vascular: No hyperdense vessel or unexpected calcification. Skull: Normal. Negative for fracture or focal lesion. Sinuses/Orbits: No acute finding. Other: None. CT CERVICAL SPINE FINDINGS Alignment: Within normal limits. Skull base and vertebrae: 7 cervical segments are well visualized. Vertebral body height is well maintained. Multilevel osteophytic changes are noted from C4-C7. Facet hypertrophic changes are noted. No acute fracture or acute facet abnormality is noted. Soft tissues and spinal canal: No significant soft tissue abnormality is noted. Upper chest: Visualized lung apices are within normal limits. Other: None IMPRESSION: CT of the head: No acute intracranial abnormality noted. CT of the cervical spine: Multilevel degenerative change without acute abnormality. Electronically Signed   By: Inez Catalina M.D.   On: 10/31/2021 23:55   CT Cervical Spine Wo Contrast  Result Date: 10/31/2021 CLINICAL DATA:  Fall 3 months ago with persistent headaches and neck pain, initial encounter EXAM: CT HEAD WITHOUT CONTRAST CT CERVICAL SPINE WITHOUT CONTRAST TECHNIQUE: Multidetector CT imaging of the head and cervical spine was performed following the standard protocol without intravenous contrast. Multiplanar CT image reconstructions of the cervical spine were also generated. COMPARISON:  None. FINDINGS: CT HEAD FINDINGS Brain: No evidence of acute infarction, hemorrhage, hydrocephalus, extra-axial collection or mass lesion/mass effect. Vascular: No  hyperdense vessel or unexpected calcification. Skull: Normal. Negative for fracture or focal lesion. Sinuses/Orbits: No acute finding. Other: None. CT CERVICAL SPINE FINDINGS Alignment: Within normal limits. Skull base and vertebrae: 7 cervical segments are well visualized. Vertebral body height is well maintained. Multilevel osteophytic changes are noted from C4-C7. Facet hypertrophic changes are noted. No acute fracture or acute facet abnormality is noted. Soft tissues and spinal canal: No significant soft tissue abnormality is noted. Upper chest: Visualized lung apices are within normal limits. Other: None IMPRESSION: CT of the head: No acute intracranial abnormality noted. CT of the cervical spine: Multilevel degenerative change without acute abnormality. Electronically Signed   By: Inez Catalina M.D.   On: 10/31/2021 23:55     Merrily Brittle, DO 11/01/2021, 2:26 AM PGY-1, [place] Intern pager: [phone], text pages welcome   FPTS  Upper-Level Resident Addendum   I have independently interviewed and examined the patient. I have discussed the above with the original author and agree with their documentation. My edits for correction/addition/clarification are included where appropriate. Please see also any attending notes.   Sharion Settler, DO PGY-2, [facility] 11/01/2021 4:18 AM  FPTS Service pager: [phone] (text pages welcome through [facility])

## 2021-11-01 NOTE — ED Notes (Signed)
Admitting providers at beside assessing pt.

## 2021-11-01 NOTE — Progress Notes (Signed)
VASCULAR LAB    Carotid duplex has been performed.  See CV proc for preliminary results.   Shariah Assad, RVT 11/01/2021, 3:54 PM

## 2021-11-01 NOTE — Progress Notes (Signed)
FPTS Brief Progress Note  S: Patient reports that she is having pain all over her body, mainly in her shoulders and her neck at the moment.  This pain is unchanged from her presentation initially, and is similar to her previous pains.  She is not having any chest pain and does not have any dizziness at this time   O: BP 129/75 (BP Location: Right Arm)   Pulse 81   Temp 98.8 F (37.1 C) (Oral)   Resp 18   Ht 5\' 2"  (1.575 m)   Wt 93.8 kg   SpO2 91%   BMI 37.84 kg/m   General: Elderly, chronically ill-appearing female, NAD, laying in bed HEENT: Mucous membranes moderately dry CV: Regular rate on telemetry Respiratory: Breathing comfortably on room air, no increased work of breathing  A/P: Myalgias likely secondary to severe hypokalemia - Potassium repleted overnight - On IVF of LR with 40 mEq K - Repeat BMP in the early afternoon - Address repletion as appropriate following repeat labs  Nonoliguric AKI on CKD stage III - IVF of LR with potassium -Follow-up repeat BMP this afternoon - Likely secondary to dehydration and poor oral intake, encouraging oral intake - Monitor strict I's and O's  Abnormal MRA  c/f vertebral dissection MRA head and neck showed thready flow in V1 and V2 with concern for possible severe origin stenosis with concern for possible vertebral dissection.  Vascular surgery was called by night team Dr. , who was recommended to obtain a vascular carotid ultrasound and to touch base again with vascular if this is abnormal. -Follow-up carotid ultrasound -Touch base with vascular if abnormal   Farhad Burleson, Melba Coon, DO 11/01/2021, 9:24 AM PGY-2, [facility] Night Resident  Please page [phone] with questions.

## 2021-11-01 NOTE — Progress Notes (Addendum)
Late Entry Progress Note    Spoke with Dr. Karin Lieu with vascular surgery earlier this morning around 730AM, to get further recommendations as patient's MRA was inconclusive for vertebral dissection.  He suggested obtaining a carotid ultrasound to further evaluate the area of suspicion.  Recommended that we start patient on aspirin as well as this would be treatment for dissection. If ultrasound positive, he stated we should give him a call back. If negative, nothing further. He also suspected her myalgias could be from her profound hypokalemia.  Carotid duplex was obtained and preliminary results are negative.  At this time, no further concern for dissection.

## 2021-11-01 NOTE — Hospital Course (Addendum)
Meredith [name] is a 70 y.o. female who was admitted to [facility] with complaints of neck, shoulder, and back pain and found to be severely hypokalemic and in atrial fibrillation.  Below is her hospital course listed by problem.  Refer to the H&P for additional information.  Severe Hypokalemia  Myalgias Patient presented to the emergency department with complaints of neck, upper back, and bilateral shoulder pain.  ED work-up revealed a potassium of 2.  She was repleted with IV and p.o. potassium with improvement to 4.3 on discharge.  It was thought that her muscle aches were related to her hypokalemia as CT head, C-spine, and MRI were negative for acute findings.  She has a history of longstanding hypokalemia and is on potassium supplementation at home.  It was also thought that her acute hypokalemia may have been due to overdiuresis with HCTZ and in the setting of dehydration and poor PO intake. Her HCTZ was discontinued at discharge.  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 Likely in the setting of over-diuresis and dehydration, as stated above. She was provided IV LR at maintenance rate with improvement to a Creatinine of 1.34 at time of discharge.   Persistent Atrial Fibrillation On arrival to the ED, EKG showed rate-controlled atrial fibrillation. She was continued on her Xarelto, and Toprol-XL.   Hypertension Continued on home toprol-xl and amlodipine while inpatient. HCTZ was held 2/2 hypokalemia. Her blood pressures were normotensive during admission.   Depression  PTSD  Patient shared history of multiple traumas including the loss of her son to murder in 1993 and the recent loss of her nephew one month ago. She was tearful in discussing these events and admitted to having some hallucinations of seeing her son. She was recommended to start an SSRI but declined. Continue to follow up mood symptoms outpatient, she would definitely  benefit from both counseling and anti-depressants.   Unintentional Weight Loss Reported unintentional weight loss and decreased appetite.  Unclear how much weight she has lost and in what amount of time.  She did appear significantly depressed, so this could be related.  However, it is concerning that she is not up-to-date on her cancer screenings and would benefit from further work-up outpatient for this if the weight loss is significant.  Other problems were chronic and stable.   Issues for PCP  Recheck BMP; ensure potassium is within normal limits. May need to adjust home potassium regimen. She was d/c with instructions to go back on her home regimen of 20 mEq Kcl BID.  BP check. HCTZ was discontinued on discharge.  Consider starting an aldosterone receptor antagonist (such as spirolactone) if she continues to have hypokalemia and hypertension. Follow up on mood, she would benefit from SSRI and therapy for her depression/PTSD Consider colonoscopy to r/o colon cancer given extensive weight loss.

## 2021-11-02 DIAGNOSIS — M542 Cervicalgia: Secondary | ICD-10-CM | POA: Diagnosis not present

## 2021-11-02 DIAGNOSIS — Z7901 Long term (current) use of anticoagulants: Secondary | ICD-10-CM | POA: Diagnosis not present

## 2021-11-02 DIAGNOSIS — Z888 Allergy status to other drugs, medicaments and biological substances status: Secondary | ICD-10-CM | POA: Diagnosis not present

## 2021-11-02 DIAGNOSIS — E1122 Type 2 diabetes mellitus with diabetic chronic kidney disease: Secondary | ICD-10-CM | POA: Diagnosis present

## 2021-11-02 DIAGNOSIS — I129 Hypertensive chronic kidney disease with stage 1 through stage 4 chronic kidney disease, or unspecified chronic kidney disease: Secondary | ICD-10-CM | POA: Diagnosis present

## 2021-11-02 DIAGNOSIS — I491 Atrial premature depolarization: Secondary | ICD-10-CM | POA: Diagnosis present

## 2021-11-02 DIAGNOSIS — I4819 Other persistent atrial fibrillation: Secondary | ICD-10-CM | POA: Diagnosis present

## 2021-11-02 DIAGNOSIS — E7800 Pure hypercholesterolemia, unspecified: Secondary | ICD-10-CM | POA: Diagnosis present

## 2021-11-02 DIAGNOSIS — M436 Torticollis: Secondary | ICD-10-CM | POA: Diagnosis present

## 2021-11-02 DIAGNOSIS — Z20822 Contact with and (suspected) exposure to covid-19: Secondary | ICD-10-CM | POA: Diagnosis present

## 2021-11-02 DIAGNOSIS — J4520 Mild intermittent asthma, uncomplicated: Secondary | ICD-10-CM | POA: Diagnosis present

## 2021-11-02 DIAGNOSIS — Z79899 Other long term (current) drug therapy: Secondary | ICD-10-CM | POA: Diagnosis not present

## 2021-11-02 DIAGNOSIS — N179 Acute kidney failure, unspecified: Secondary | ICD-10-CM | POA: Diagnosis present

## 2021-11-02 DIAGNOSIS — F329 Major depressive disorder, single episode, unspecified: Secondary | ICD-10-CM | POA: Diagnosis present

## 2021-11-02 DIAGNOSIS — E669 Obesity, unspecified: Secondary | ICD-10-CM | POA: Diagnosis present

## 2021-11-02 DIAGNOSIS — F4310 Post-traumatic stress disorder, unspecified: Secondary | ICD-10-CM | POA: Diagnosis present

## 2021-11-02 DIAGNOSIS — K219 Gastro-esophageal reflux disease without esophagitis: Secondary | ICD-10-CM | POA: Diagnosis present

## 2021-11-02 DIAGNOSIS — Z6837 Body mass index (BMI) 37.0-37.9, adult: Secondary | ICD-10-CM | POA: Diagnosis not present

## 2021-11-02 DIAGNOSIS — Z7984 Long term (current) use of oral hypoglycemic drugs: Secondary | ICD-10-CM | POA: Diagnosis not present

## 2021-11-02 DIAGNOSIS — E876 Hypokalemia: Secondary | ICD-10-CM | POA: Diagnosis present

## 2021-11-02 DIAGNOSIS — Z881 Allergy status to other antibiotic agents status: Secondary | ICD-10-CM | POA: Diagnosis not present

## 2021-11-02 DIAGNOSIS — N182 Chronic kidney disease, stage 2 (mild): Secondary | ICD-10-CM | POA: Diagnosis present

## 2021-11-02 DIAGNOSIS — E119 Type 2 diabetes mellitus without complications: Secondary | ICD-10-CM | POA: Diagnosis not present

## 2021-11-02 DIAGNOSIS — I10 Essential (primary) hypertension: Secondary | ICD-10-CM | POA: Diagnosis not present

## 2021-11-02 DIAGNOSIS — E860 Dehydration: Secondary | ICD-10-CM | POA: Diagnosis present

## 2021-11-02 DIAGNOSIS — R634 Abnormal weight loss: Secondary | ICD-10-CM | POA: Diagnosis present

## 2021-11-02 DIAGNOSIS — I2510 Atherosclerotic heart disease of native coronary artery without angina pectoris: Secondary | ICD-10-CM | POA: Diagnosis present

## 2021-11-02 LAB — BASIC METABOLIC PANEL
Anion gap: 7 (ref 5–15)
Anion gap: 9 (ref 5–15)
BUN: 11 mg/dL (ref 8–23)
BUN: 11 mg/dL (ref 8–23)
CO2: 24 mmol/L (ref 22–32)
CO2: 25 mmol/L (ref 22–32)
Calcium: 9.5 mg/dL (ref 8.9–10.3)
Calcium: 9.8 mg/dL (ref 8.9–10.3)
Chloride: 107 mmol/L (ref 98–111)
Chloride: 103 mmol/L (ref 98–111)
Creatinine, Ser: 1.55 mg/dL — ABNORMAL HIGH (ref 0.44–1.00)
Creatinine, Ser: 1.38 mg/dL — ABNORMAL HIGH (ref 0.44–1.00)
GFR, Estimated: 36 mL/min — ABNORMAL LOW (ref >60)
GFR, Estimated: 41 mL/min — ABNORMAL LOW (ref >60)
Glucose, Bld: 102 mg/dL — ABNORMAL HIGH (ref 70–99)
Glucose, Bld: 152 mg/dL — ABNORMAL HIGH (ref 70–99)
Potassium: 2.9 mmol/L — ABNORMAL LOW (ref 3.5–5.1)
Potassium: 3.6 mmol/L (ref 3.5–5.1)
Sodium: 138 mmol/L (ref 135–145)
Sodium: 137 mmol/L (ref 135–145)

## 2021-11-02 LAB — GLUCOSE, CAPILLARY
Glucose-Capillary: 87 mg/dL (ref 70–99)
Glucose-Capillary: 64 mg/dL — ABNORMAL LOW (ref 70–99)
Glucose-Capillary: 170 mg/dL — ABNORMAL HIGH (ref 70–99)
Glucose-Capillary: 117 mg/dL — ABNORMAL HIGH (ref 70–99)
Glucose-Capillary: 166 mg/dL — ABNORMAL HIGH (ref 70–99)

## 2021-11-02 LAB — MAGNESIUM: Magnesium: 2 mg/dL (ref 1.7–2.4)

## 2021-11-02 MED ORDER — RIVAROXABAN 20 MG PO TABS
20.0000 mg | ORAL_TABLET | Freq: Every day | ORAL | Status: DC
  Administered 2021-11-02 – 2021-11-03 (×2): 20 mg via ORAL
  Filled 2021-11-02 (×2): qty 1

## 2021-11-02 MED ORDER — POTASSIUM CHLORIDE 2 MEQ/ML IV SOLN
INTRAVENOUS | Status: DC
  Filled 2021-11-02 (×2): qty 1000

## 2021-11-02 MED ORDER — POTASSIUM CHLORIDE CRYS ER 20 MEQ PO TBCR
40.0000 meq | EXTENDED_RELEASE_TABLET | Freq: Three times a day (TID) | ORAL | Status: DC
  Administered 2021-11-02 – 2021-11-03 (×2): 40 meq via ORAL
  Filled 2021-11-02 (×2): qty 2

## 2021-11-02 MED ORDER — POLYETHYLENE GLYCOL 3350 17 G PO PACK
17.0000 g | PACK | Freq: Two times a day (BID) | ORAL | Status: DC
  Administered 2021-11-02 – 2021-11-03 (×4): 17 g via ORAL
  Filled 2021-11-02 (×4): qty 1

## 2021-11-02 MED ORDER — SENNA 8.6 MG PO TABS
1.0000 | ORAL_TABLET | Freq: Every day | ORAL | Status: DC
  Administered 2021-11-02: 8.6 mg via ORAL
  Filled 2021-11-02: qty 1

## 2021-11-02 MED ORDER — POTASSIUM CHLORIDE 2 MEQ/ML IV SOLN
INTRAVENOUS | Status: AC
  Filled 2021-11-02 (×2): qty 1000

## 2021-11-02 MED ORDER — POTASSIUM CHLORIDE CRYS ER 20 MEQ PO TBCR
40.0000 meq | EXTENDED_RELEASE_TABLET | Freq: Four times a day (QID) | ORAL | Status: DC
  Administered 2021-11-02: 40 meq via ORAL
  Filled 2021-11-02: qty 2

## 2021-11-02 MED ORDER — POTASSIUM CHLORIDE 2 MEQ/ML IV SOLN
INTRAVENOUS | Status: DC
Start: 2021-11-02 — End: 2021-11-02

## 2021-11-02 NOTE — Progress Notes (Addendum)
Family Medicine Teaching Service Daily Progress Note Intern Pager: [phone]  Patient name: Meredith [name] Medical record number: [id number] Date of birth: [birth year] Age: 70 y.o. Gender: female  Primary Care Provider: Ezequiel Essex, MD Consultants: Vascular (curb-sided) Code Status: FULL CODE  Pt Overview and Major Events to Date:  11/14: Admitted for severe hypokalemia (K 2)  Assessment and Plan: Meredith [name] is a 70 y.o. female who presented with upper extremity myalgias secondary to severe hypokalemia.  She has a past medical history significant for persistent atrial fibrillation on Xarelto, type 2 diabetes, CAD, hypertension, hyperlipidemia, GERD, MDD, and PTSD.  Myalgias 2/2 Hypokalemia: Ongoing, improved HCTZ has been held since admission.  She has had significant repletion with both p.o. and IV potassium.  She has had slight improvement of her potassium to 2.9 this morning. She has noted some improvement of her myalgias.  Initially in the ED, she received work-up to assess for vertebral artery or cerebral aneurysm.  MRA was inconclusive.discussed with vascular surgery who recommended carotid duplex to better visualize areas of suspicion on MRA and starting on anti-platelet.  Preliminary read of duplex was negative, thus no longer concerned for vertebral dissection at this time. -F/u Mg level, replete as necessary to keep level >2 -Recommend discontinuing HCTZ on discharge -Consider starting spironolactone when improved renal function -LR with 40 mEq Kcl x12 hours -Continue 40 mEq PO Kcl TID  -Repeat BMP 1300 -Continue ASA, can consider d/c   Nonoliguric AKI on CKD stage II: Ongoing, stable Had slight improvement of her creatinine after fluids.  Creatinine this morning was stable at 1.55 from 1.53.  Baseline is around 0.9-1.1.  Continue to suspect prerenal etiology from her dehydration and potential overdiuresis. She tells me that she is eating and hydrating well but she may  just be in a larger deficit. -Continue IV fluids as above -Strict I's and O's -Follow-up afternoon BMP -Avoid nephrotoxic agents as able  Constipation: Acute Reports no BM since admission. Would like some assistance. Abdominal exam is benign.  -MiraLAX 17g BID -Senna daily   Persistent atrial fibrillation on Xarelto History of atrial tachycardia Continues to be in rate controlled atrial fibrillation, rate 70s. -Continue home Xarelto and metoprolol succinate -Continue cardiac monitoring  Hypertension: Chronic, stable Bps have been normotensive.  -Holding HCTZ -Continue amlodipine 10 mg daily -As stated above, continue spironolactone when renal function allows  Type 2 diabetes mellitus Received total of**units sliding scale insulin yesterday. -Continue sensitive sliding scale insulin without at bedtime coverage  CAD status post cath 2016  hyperlipidemia -Continue home rosuvastatin and ezetimibe  MDD  PTSD  -Follow-up mood outpatient -Continue discussion about starting SSRI outpatient -Continue to offer chaplain support while inpatient  Unintentional weight loss Unclear if related to decreased appetite with age vs depression vs an underlying malignancy. She did not have any B symptoms though also was not adherent to recommended cancer screenings outpatient.  Appears that her last Pap smear was in 2009 and last colonoscopy was in 2001. -Consider continued work-up outpatient  FEN/GI: Heart healthy/carb modified PPx: Xarelto  Dispo:Home  potentially in the next 1-2 days . Barriers include potassium improvement.   Subjective:  Meredith [name] continues to endorse some myalgias to her upper extremities though she notes some improvement since admission. She feels that she has been eating well and hydrating well. She is having normal urinary output. She reports no BM since admission which is unusual for her. She would like some assistance with this. She reports that  she is getting up  out of the bed during the day. She has no other concerns.   Objective: Temp:  [98.4 F (36.9 C)-98.8 F (37.1 C)] 98.4 F (36.9 C) (11/24 2114) Pulse Rate:  [77-81] 77 (11/24 2114) Resp:  [16-18] 16 (11/24 2114) BP: (115-129)/(62-75) 122/62 (11/24 2114) SpO2:  [95 %-99 %] 99 % (11/24 2114) Weight:  [93.8 kg] 93.8 kg (11/24 0408) Physical Exam: General: Asleep but awakens easily, pleasant, in NAD  Cardiovascular: rate-controlled atrial fibrillation, 2+ radial pulses b/l Respiratory: CTAB anteriorly, normal work of breathing  Abdomen: obese, soft, non-distended and non-tender in all quadrants Extremities: without edema   Laboratory: Recent Labs  Lab 10/31/21 1934 11/01/21 0459  WBC 10.1 8.0  HGB 13.9 13.4  HCT 41.0 39.0  PLT 235 190   Recent Labs  Lab 11/01/21 1230 11/01/21 2021 11/02/21 0254  NA 138 137 138  K 2.8* 3.0* 2.9*  CL 102 103 107  CO2 28 25 24   BUN 14 13 11   CREATININE 1.66* 1.53* 1.55*  CALCIUM 9.5 9.2 9.5  GLUCOSE 135* 155* 102*    Imaging/Diagnostic Tests: MR ANGIO HEAD WO CONTRAST  Result Date: 11/01/2021 CLINICAL DATA:  Headache and neck pain. Evaluate for vertebral artery or cerebral aneurysm. EXAM: MRA NECK WITHOUT AND WITH CONTRAST MRA HEAD WITHOUT AND WITH CONTRAST TECHNIQUE: Multiplanar and multiecho pulse sequences of the neck were obtained without and with intravenous contrast. Angiographic images of the neck were obtained using MRA technique without and with intravenous contast.; Angiographic images of the Circle of Willis were obtained using MRA technique without and with intravenous contrast. CONTRAST:  56mL GADAVIST GADOBUTROL 1 MMOL/ML IV SOLN COMPARISON:  None. FINDINGS: MRA NECK FINDINGS By time-of-flight, no meaningful flow seen throughout most of the covered left vertebral artery. On postcontrast imaging there is a left proximal vertebral stenosis that is advanced with diffuse high-grade narrowing throughout the V1 and V2 segments and  normalization by V3 and V4. Negative arch with 3 vessel branching. Tortuosity of great vessels proximally. The carotids are smoothly contoured and widely patent. There is proximal subclavian atherosclerosis without flow limiting stenosis. MRA HEAD FINDINGS Major vessels are diffusely patent. Mild atheromatous irregularity of branch vessels, especially seen at the posterior cerebral arteries. Proximal basilar fenestration. No vessel beading. 2 mm outpouching from the supraclinoid right ICA, best seen on neck MRA. IMPRESSION: Intracranial MRA: 1. No emergent finding or flow limiting stenosis of major vessels. 2. 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 Neck MRA: 1. Thready flow in the left V1 and V2 segments. This could be related to severe origin stenosis with downstream underfilling, but given the extent and setting of neck pain, vertebral dissection is also a strong consideration. 2. The other major neck arteries are widely patent. Electronically Signed   By: Jorje Guild M.D.   On: 11/01/2021 04:42   MR Angiogram Neck W or Wo Contrast  Result Date: 11/01/2021 CLINICAL DATA:  Headache and neck pain. Evaluate for vertebral artery or cerebral aneurysm. EXAM: MRA NECK WITHOUT AND WITH CONTRAST MRA HEAD WITHOUT AND WITH CONTRAST TECHNIQUE: Multiplanar and multiecho pulse sequences of the neck were obtained without and with intravenous contrast. Angiographic images of the neck were obtained using MRA technique without and with intravenous contast.; Angiographic images of the Circle of Willis were obtained using MRA technique without and with intravenous contrast. CONTRAST:  58mL GADAVIST GADOBUTROL 1 MMOL/ML IV SOLN COMPARISON:  None. FINDINGS: MRA NECK FINDINGS By time-of-flight, no meaningful  flow seen throughout most of the covered left vertebral artery. On postcontrast imaging there is a left proximal vertebral stenosis that is advanced with diffuse high-grade narrowing throughout the V1  and V2 segments and normalization by V3 and V4. Negative arch with 3 vessel branching. Tortuosity of great vessels proximally. The carotids are smoothly contoured and widely patent. There is proximal subclavian atherosclerosis without flow limiting stenosis. MRA HEAD FINDINGS Major vessels are diffusely patent. Mild atheromatous irregularity of branch vessels, especially seen at the posterior cerebral arteries. Proximal basilar fenestration. No vessel beading. 2 mm outpouching from the supraclinoid right ICA, best seen on neck MRA. IMPRESSION: Intracranial MRA: 1. No emergent finding or flow limiting stenosis of major vessels. 2. 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 Neck MRA: 1. Thready flow in the left V1 and V2 segments. This could be related to severe origin stenosis with downstream underfilling, but given the extent and setting of neck pain, vertebral dissection is also a strong consideration. 2. The other major neck arteries are widely patent. Electronically Signed   By: Jorje Guild M.D.   On: 11/01/2021 04:42   VAS US CAROTID  Result Date: 11/01/2021 Carotid Arterial Duplex Study Patient Name:  LURLEAN BATTISTELLI [facility]  Date of Exam:   11/01/2021 Medical Rec #: [id number]        Accession #:    [id number] Date of Birth: 13-Jun-1951        Patient Gender: F Patient Age:   21 years Exam Location:  [facility] Procedure:      VAS US CAROTID Referring Phys: MARSHALL CHAMBLISS --------------------------------------------------------------------------------  Indications:       Neck pain. MRA head and neck showed thready flow in the left                    V1 and V2 with concern for possible severe origin stenosis                    with concern for possible vertebral dissection. Risk Factors:      Hypertension, hyperlipidemia, Diabetes, coronary artery                    disease. Other Factors:     Atrial fibrillation (on Xarelto). Comparison Study:  No prior study Performing  Technologist: Sharion Dove RVS  Examination Guidelines: A complete evaluation includes B-mode imaging, spectral Doppler, color Doppler, and power Doppler as needed of all accessible portions of each vessel. Bilateral testing is considered an integral part of a complete examination. Limited examinations for reoccurring indications may be performed as noted.  Right Carotid Findings: +----------+--------+--------+--------+------------------+------------------+           PSV cm/sEDV cm/sStenosisPlaque DescriptionComments           +----------+--------+--------+--------+------------------+------------------+ CCA Prox  129     20                                intimal thickening +----------+--------+--------+--------+------------------+------------------+ CCA Distal130     22                                intimal thickening +----------+--------+--------+--------+------------------+------------------+ ICA Prox  69      22              heterogenous                         +----------+--------+--------+--------+------------------+------------------+  ICA Distal63      21                                                   +----------+--------+--------+--------+------------------+------------------+ ECA       93      15                                                   +----------+--------+--------+--------+------------------+------------------+ +----------+--------+-------+--------+-------------------+           PSV cm/sEDV cmsDescribeArm Pressure (mmHG) +----------+--------+-------+--------+-------------------+ [id number]                                        +----------+--------+-------+--------+-------------------+ +---------+--------+--+--------+--+ VertebralPSV cm/s39EDV cm/s17 +---------+--------+--+--------+--+  Left Carotid Findings: +----------+--------+--------+--------+------------------+------------------+           PSV cm/sEDV  cm/sStenosisPlaque DescriptionComments           +----------+--------+--------+--------+------------------+------------------+ CCA Prox  126     22                                intimal thickening +----------+--------+--------+--------+------------------+------------------+ CCA Distal123     26                                intimal thickening +----------+--------+--------+--------+------------------+------------------+ ICA Prox  77      22                                                   +----------+--------+--------+--------+------------------+------------------+ ICA Distal91      32                                                   +----------+--------+--------+--------+------------------+------------------+ ECA       112     11                                                   +----------+--------+--------+--------+------------------+------------------+ +----------+--------+--------+--------+-------------------+           PSV cm/sEDV cm/sDescribeArm Pressure (mmHG) +----------+--------+--------+--------+-------------------+ [id number]                                         +----------+--------+--------+--------+-------------------+ +---------+--------+---+--------+--+ VertebralPSV cm/s102EDV cm/s15 +---------+--------+---+--------+--+   Summary: Right Carotid: The extracranial vessels were near-normal with only minimal wall                thickening or plaque. Left Carotid: The extracranial vessels were near-normal with only minimal wall  thickening or plaque. Vertebrals:  Bilateral vertebral arteries demonstrate antegrade flow. Subclavians: Normal flow hemodynamics were seen in bilateral subclavian              arteries. *See table(s) above for measurements and observations.     Preliminary      Sharion Settler, DO 11/02/2021, 4:05 AM PGY-2, [place] Intern pager: [phone], text pages welcome

## 2021-11-02 NOTE — Evaluation (Signed)
Physical Therapy Evaluation and Discharge Patient Details Name: Meredith [name] MRN: [id number] DOB: 03-Jun-1951 Today's Date: 11/02/2021  History of Present Illness  70 y.o. female with a 1 day history of diffuse neck pain and bilateral shoulder and upper back pain. 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 CT head and C-spine are obtained which are normal and show no evidence of hemorrhage. 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 Admitted for observation of Myalgias 2/2 Severe Hypokalemia and Non-oliguric AKI on CKD Stage 2 PPMHx is significant for persistent Afib (on xarelto), T2DM (A1c 7.3 in 04/2021), CAD (s/p cath 2016), HTN, HLD, GERD, MDD, PTSD.    Clinical Impression  PTA pt living with son in single story home with 5 steps to enter. Pt was completely independent with mobility, and ADLs, working at [employer] and driving. Pt is limited in safe mobility by increased neck pain L>R with movement, in presence of generalized weakness. Pt taught log rolling techinque for bed mobility to decrease torque on neck with getting OOB. Pt is min guard for bed mobility, supervision for transfers and stairs and mod I for ambulation in hallway with SPC. Pt may benefit from Outpatient PT if neck pain persists. PT will discharge from therapy caseload and ask that Mobility specialist pick up for maintaining mobility while in hospital.       Recommendations for follow up therapy are one component of a multi-disciplinary discharge planning process, led by the attending physician.  Recommendations may be updated based on patient status, additional functional criteria and insurance authorization.  Follow Up Recommendations Outpatient PT    Assistance Recommended at Discharge Intermittent Supervision/Assistance  Functional Status Assessment Patient has not had a recent decline in their functional  status  Equipment Recommendations  None recommended by PT       Precautions / Restrictions Precautions Precautions: Fall Restrictions Weight Bearing Restrictions: No      Mobility  Bed Mobility Overal bed mobility: Needs Assistance Bed Mobility: Sidelying to Sit;Rolling Rolling: Min guard Sidelying to sit: Min guard       General bed mobility comments: educated pt in log rolling to come to upright to reduce spinal rotation, with increased verbal cues pt able to complete and reports less neck pain than when she was up earlier    Transfers Overall transfer level: Needs assistance Equipment used: Straight cane Transfers: Sit to/from Stand Sit to Stand: Supervision           General transfer comment: supervision for power up from bed, use of SPC in R UE    Ambulation/Gait Ambulation/Gait assistance: Modified independent (Device/Increase time) Gait Distance (Feet): 250 Feet Assistive device: Straight cane Gait Pattern/deviations: Step-through pattern;Decreased step length - right;Decreased step length - left;Shuffle;Antalgic Gait velocity: slowed Gait velocity interpretation: <1.31 ft/sec, indicative of household ambulator   General Gait Details: mod I for slowed, antalgic but steady gait with SPC,  Stairs Stairs: Yes Stairs assistance: Supervision Stair Management: One rail Right;With cane;Forwards Number of Stairs: 1 (x3) General stair comments: pt able to step and back from one step x3 with supervision and use of SPC L UE and rail on R  Wheelchair Mobility    Modified Rankin (Stroke Patients Only)       Balance Overall balance assessment: Mild deficits observed, not formally tested  Pertinent Vitals/Pain Pain Assessment: 0-10 Pain Score: 9  Pain Location: neck, shooting into back of head with neck movement Pain Descriptors / Indicators: Shooting;Aching Pain Intervention(s): Limited activity  within patient's tolerance;Monitored during session;Repositioned    Home Living Family/patient expects to be discharged to:: Private residence Living Arrangements: Children Available Help at Discharge: Available PRN/intermittently (son works during the day) Type of Home: House Home Access: Stairs to enter Entrance Stairs-Rails: Right Secretary/administrator of Steps: 5   Home Layout: One level Home Equipment: Cane - single point      Prior Function Prior Level of Function : Independent/Modified Independent               ADLs Comments: works at [employer], independent in ADLs and mobility        Extremity/Trunk Assessment   Upper Extremity Assessment Upper Extremity Assessment: Defer to OT evaluation    Lower Extremity Assessment Lower Extremity Assessment: Generalized weakness    Cervical / Trunk Assessment Cervical / Trunk Assessment: Other exceptions Cervical / Trunk Exceptions: L>R paraspinal and UT pain, limiting movement, rotation and lateral flexion limited to approx 10 degrees, neck forward flex and ext approx 20 degrees  Communication   Communication: No difficulties  Cognition Arousal/Alertness: Awake/alert Behavior During Therapy: Flat affect Overall Cognitive Status: Within Functional Limits for tasks assessed                                          General Comments General comments (skin integrity, edema, etc.): VSS on RA        Assessment/Plan    PT Assessment Patient needs continued PT services  PT Problem List Pain;Decreased range of motion       PT Treatment Interventions DME instruction;Gait training;Functional mobility training;Stair training;Therapeutic activities;Therapeutic exercise;Patient/family education    PT Goals (Current goals can be found in the Care Plan section)  Acute Rehab PT Goals Patient Stated Goal: have less pain PT Goal Formulation: With patient Time For Goal Achievement: 11/16/21 Potential to Achieve  Goals: Good    Frequency Min 3X/week    AM-PAC PT "6 Clicks" Mobility  Outcome Measure Help needed turning from your back to your side while in a flat bed without using bedrails?: A Little Help needed moving from lying on your back to sitting on the side of a flat bed without using bedrails?: A Little Help needed moving to and from a bed to a chair (including a wheelchair)?: None Help needed standing up from a chair using your arms (e.g., wheelchair or bedside chair)?: None Help needed to walk in hospital room?: None Help needed climbing 3-5 steps with a railing? : A Little 6 Click Score: 21    End of Session   Activity Tolerance: Patient limited by pain Patient left: in chair;with call bell/phone within reach Nurse Communication: Mobility status;Other (comment) (pt lidocaine patch not in place) PT Visit Diagnosis: Other abnormalities of gait and mobility (R26.89);Muscle weakness (generalized) (M62.81);Pain Pain - Right/Left: Left Pain - part of body:  (neck)    Time: 1610-9604 PT Time Calculation (min) (ACUTE ONLY): 32 min   Charges:   PT Evaluation $PT Eval Moderate Complexity: 1 Mod PT Treatments $Therapeutic Activity: 8-22 mins        Rylen Hou B. Beverely Risen PT, DPT Acute Rehabilitation Services Pager [phone] Office [phone]   Elon Alas Fleet 11/02/2021, 9:20 AM

## 2021-11-02 NOTE — Progress Notes (Signed)
Mobility Specialist Progress Note    11/02/21 1418  Mobility  Activity Ambulated in hall  Level of Assistance Modified independent, requires aide device or extra time  Assistive Device [facility] Ambulated (ft) 400 ft  Mobility Ambulated independently in hallway  Mobility Response Tolerated well  Mobility performed by Mobility specialist  $Mobility charge 1 Mobility   Pt received in bed and agreeable. C/o some wooziness earlier but said she no longer felt it. No complaints on walk. Returned to sitting EOB with lab present to draw blood.   [facility] Mobility Specialist  M.S. Primary Phone: [phone] M.S. Secondary Phone: [phone]

## 2021-11-02 NOTE — Evaluation (Signed)
Occupational Therapy Evaluation Patient Details Name: Meredith [name] MRN: [id number] DOB: May 18, 1951 Today's Date: 11/02/2021   History of Present Illness 70 y.o. female with a 1 day history of diffuse neck pain and bilateral shoulder and upper back pain. 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 CT head and C-spine are obtained which are normal and show no evidence of hemorrhage. 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 Admitted for observation of Myalgias 2/2 Severe Hypokalemia and Non-oliguric AKI on CKD Stage 2 PPMHx is significant for persistent Afib (on xarelto), T2DM (A1c 7.3 in 04/2021), CAD (s/p cath 2016), HTN, HLD, GERD, MDD, PTSD.   Clinical Impression   PTA, pt was living with her son and was independent and working at [employer]. Pt currently requiring Supervision for ADLs and functional mobility. Facilitating manual therapy and PROM of cervical spine; pt reporting slight relief after ROM. Initiating education on compensatory techniques for reducing neck pain.  Pt would benefit form further acute OT to continue education for compensatory techniques for ADLs. Recommend dc to home once medically stable per physician.      Recommendations for follow up therapy are one component of a multi-disciplinary discharge planning process, led by the attending physician.  Recommendations may be updated based on patient status, additional functional criteria and insurance authorization.   Follow Up Recommendations  No OT follow up    Assistance Recommended at Discharge PRN  Functional Status Assessment  Patient has had a recent decline in their functional status and demonstrates the ability to make significant improvements in function in a reasonable and predictable amount of time.  Equipment Recommendations  None recommended by OT    Recommendations for Other Services        Precautions / Restrictions Precautions Precautions: Fall      Mobility Bed Mobility               General bed mobility comments: Reviewing log roll technique    Transfers Overall transfer level: Needs assistance Equipment used: Straight cane Transfers: Sit to/from Stand Sit to Stand: Supervision           General transfer comment: supervision for power up from bed, use of SPC in R UE      Balance Overall balance assessment: Mild deficits observed, not formally tested                                         ADL either performed or assessed with clinical judgement   ADL Overall ADL's : Needs assistance/impaired Eating/Feeding: Set up;Sitting   Grooming: Oral care;Standing;Supervision/safety Grooming Details (indicate cue type and reason): Providing education on oral care and compensatory techniques Upper Body Bathing: Supervision/ safety;Sitting   Lower Body Bathing: Supervison/ safety;Sit to/from stand   Upper Body Dressing : Supervision/safety;Sitting   Lower Body Dressing: Supervision/safety;Sit to/from stand   Toilet Transfer: Supervision/safety;Ambulation           Functional mobility during ADLs: Supervision/safety;Cane General ADL Comments: Pt limited by significant neck pain. initating educationon cervical precaution to reduce pain.     Vision Baseline Vision/History: 1 Wears glasses       Perception     Praxis      Pertinent Vitals/Pain Pain Assessment: Faces Faces Pain Scale: Hurts whole lot Pain Location: neck, shooting into back of  head with neck movement Pain Descriptors / Indicators: Shooting;Aching Pain Intervention(s): Monitored during session;Limited activity within patient's tolerance;Repositioned     Hand Dominance Right   Extremity/Trunk Assessment Upper Extremity Assessment Upper Extremity Assessment: Generalized weakness   Lower Extremity Assessment Lower Extremity Assessment: Defer to PT  evaluation   Cervical / Trunk Assessment Cervical / Trunk Assessment: Other exceptions Cervical / Trunk Exceptions: L>R paraspinal and UT pain, limiting movement, rotation and lateral flexion limited to approx 10 degrees, neck forward flex and ext approx 20 degrees   Communication Communication Communication: No difficulties   Cognition Arousal/Alertness: Awake/alert Behavior During Therapy: Flat affect Overall Cognitive Status: Within Functional Limits for tasks assessed                                       General Comments  VSS on RA    Exercises Exercises: Other exercises Other Exercises Other Exercises: PROM cervical. supine position. x3 and holding at end range. Cues for breathing. Other Exercises: manual therapy at cervical spine.   Shoulder Instructions      Home Living Family/patient expects to be discharged to:: Private residence Living Arrangements: Children Available Help at Discharge: Available PRN/intermittently (son works during the day) Type of Home: House Home Access: Stairs to enter Technical brewer of Steps: 5 Entrance Stairs-Rails: Right [place]: One level     Bathroom Shower/Tub: Teacher, early years/pre: Handicapped height Bathroom Accessibility: Yes   Home Equipment: [place] - single point          Prior Functioning/Environment Prior Level of Function : Independent/Modified Independent               ADLs Comments: works at [employer], independent in ADLs and mobility        OT Problem List: Decreased strength;Decreased range of motion;Decreased activity tolerance;Impaired balance (sitting and/or standing);Decreased knowledge of use of DME or AE;Decreased knowledge of precautions;Pain      OT Treatment/Interventions: Self-care/ADL training;Therapeutic exercise;DME and/or AE instruction;Energy conservation;Therapeutic activities;Patient/family education    OT Goals(Current goals can be found in the care  plan section) Acute Rehab OT Goals Patient Stated Goal: Reduce pain OT Goal Formulation: With patient Time For Goal Achievement: 11/16/21 Potential to Achieve Goals: Good  OT Frequency: Min 2X/week   Barriers to D/C:            Co-evaluation              AM-PAC OT "6 Clicks" Daily Activity     Outcome Measure Help from another person eating meals?: None Help from another person taking care of personal grooming?: A Little Help from another person toileting, which includes using toliet, bedpan, or urinal?: A Little Help from another person bathing (including washing, rinsing, drying)?: A Little Help from another person to put on and taking off regular upper body clothing?: A Little Help from another person to put on and taking off regular lower body clothing?: A Little 6 Click Score: 19   End of Session Equipment Utilized During Treatment: Other (comment) (cane) Nurse Communication: Mobility status  Activity Tolerance: Patient tolerated treatment well Patient left: in chair;with call bell/phone within reach  OT Visit Diagnosis: Unsteadiness on feet (R26.81);Other abnormalities of gait and mobility (R26.89);Muscle weakness (generalized) (M62.81);Pain Pain - part of body:  (cervical spine)                Time: 1022-1100 OT Time Calculation (  min): 38 min Charges:  OT General Charges $OT Visit: 1 Visit OT Evaluation $OT Eval Low Complexity: 1 Low OT Treatments $Self Care/Home Management : 8-22 mins $Therapeutic Exercise: 8-22 mins  Dniyah Grant MSOT, OTR/L Acute Rehab Pager: [phone] Office: [phone]  Theodoro Grist Kaila Devries 11/02/2021, 4:26 PM

## 2021-11-02 NOTE — TOC Initial Note (Signed)
Transition of Care [facility]) - Initial/Assessment Note    Patient Details  Name: Meredith [name] MRN: [id number] Date of Birth: Oct 28, 1951  [facility] CM/SW Contact:    Durenda Guthrie, RN Phone Number: 11/02/2021, 12:04 PM  Clinical Narrative:    Case Manager has sent referral to [facility] outpatient therapy.                     Patient Goals and CMS Choice        Expected Discharge Plan and Services     Discharge Planning Services: Other - See comment (CM submitted referral for outpatient therapy)                                          Prior Living Arrangements/Services                       Activities of Daily Living Home Assistive Devices/Equipment: None ADL Screening (condition at time of admission) Patient's cognitive ability adequate to safely complete daily activities?: Yes Is the patient deaf or have difficulty hearing?: No Does the patient have difficulty seeing, even when wearing glasses/contacts?: No Does the patient have difficulty concentrating, remembering, or making decisions?: No Patient able to express need for assistance with ADLs?: No Does the patient have difficulty dressing or bathing?: No Independently performs ADLs?: Yes (appropriate for developmental age) Does the patient have difficulty walking or climbing stairs?: Yes Weakness of Legs: Both Weakness of Arms/Hands: None  Permission Sought/Granted                  Emotional Assessment              Admission diagnosis:  Hypokalemia [E87.6] Torticollis, acute [M43.6] Chest pain [R07.9] Patient Active Problem List   Diagnosis Date Noted   Ingrown nail 08/16/2021   COVID-19 vaccination not done 08/16/2021   Colon cancer screening 08/16/2021   Influenza vaccination declined 08/16/2021   Tear of rotator cuff 05/04/2021   Leg pain, lateral, right 12/31/2019   Close exposure to COVID-19 virus 06/17/2019   Hypokalemia 12/04/2018   Restless  leg syndrome 12/04/2018   Osteopenia 09/15/2017   Pain of both hip joints 06/13/2017   Knee pain, chronic 07/21/2016   Numbness of fingers 04/06/2016   Estrogen deficiency 03/29/2016   CAD (coronary artery disease), native coronary artery 10/26/2015   Back pain 08/17/2015   Persistent atrial fibrillation (HCC) 02/23/2015   PAC (premature atrial contraction) 01/19/2015   Healthcare maintenance 03/04/2014   HERNIA, VENTRAL 04/18/2010   DEGENERATIVE DISC DISEASE, 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 [place] [address] Phone: [phone] Fax: [phone]  [id number] - [place], [place] - [address] AT [facility] [address] [place] [address] Phone: [phone] Fax: [phone]     Social Determinants of Health (SDOH) Interventions    Readmission Risk Interventions No flowsheet data found.

## 2021-11-03 ENCOUNTER — Other Ambulatory Visit: Payer: Self-pay | Admitting: Family Medicine

## 2021-11-03 DIAGNOSIS — E119 Type 2 diabetes mellitus without complications: Secondary | ICD-10-CM | POA: Diagnosis not present

## 2021-11-03 DIAGNOSIS — I10 Essential (primary) hypertension: Secondary | ICD-10-CM | POA: Diagnosis not present

## 2021-11-03 DIAGNOSIS — E876 Hypokalemia: Secondary | ICD-10-CM | POA: Diagnosis not present

## 2021-11-03 DIAGNOSIS — M542 Cervicalgia: Secondary | ICD-10-CM

## 2021-11-03 DIAGNOSIS — M436 Torticollis: Secondary | ICD-10-CM

## 2021-11-03 LAB — GLUCOSE, CAPILLARY
Glucose-Capillary: 113 mg/dL — ABNORMAL HIGH (ref 70–99)
Glucose-Capillary: 116 mg/dL — ABNORMAL HIGH (ref 70–99)
Glucose-Capillary: 107 mg/dL — ABNORMAL HIGH (ref 70–99)
Glucose-Capillary: 186 mg/dL — ABNORMAL HIGH (ref 70–99)

## 2021-11-03 LAB — BASIC METABOLIC PANEL
Anion gap: 6 (ref 5–15)
BUN: 12 mg/dL (ref 8–23)
CO2: 22 mmol/L (ref 22–32)
Calcium: 9 mg/dL (ref 8.9–10.3)
Chloride: 109 mmol/L (ref 98–111)
Creatinine, Ser: 1.34 mg/dL — ABNORMAL HIGH (ref 0.44–1.00)
GFR, Estimated: 42 mL/min — ABNORMAL LOW (ref >60)
Glucose, Bld: 109 mg/dL — ABNORMAL HIGH (ref 70–99)
Potassium: 4.3 mmol/L (ref 3.5–5.1)
Sodium: 137 mmol/L (ref 135–145)

## 2021-11-03 MED ORDER — SENNA 8.6 MG PO TABS
2.0000 | ORAL_TABLET | Freq: Every day | ORAL | Status: DC
  Administered 2021-11-03: 17.2 mg via ORAL
  Filled 2021-11-03: qty 2

## 2021-11-03 MED ORDER — POTASSIUM CHLORIDE CRYS ER 20 MEQ PO TBCR
40.0000 meq | EXTENDED_RELEASE_TABLET | Freq: Two times a day (BID) | ORAL | Status: DC
  Administered 2021-11-03 – 2021-11-04 (×2): 40 meq via ORAL
  Filled 2021-11-03 (×2): qty 2

## 2021-11-03 NOTE — Discharge Instructions (Addendum)
Dear Meredith [name],  Thank you for letting us participate in your care. You were hospitalized for muscle aches which we believe was due to your severely low potassium level.  We believe this occurred due to your hydrochlorothiazide and decreased appetite.  While in the hospital, we gave you potassium supplementation both orally and through the IV.  Your kidney levels were also slightly elevated on arrival, they have improved with fluid hydration.  Continue to stay hydrated!  We recommend that you discontinue your hydrochlorothiazide.  Please follow-up in the family medicine clinic for a lab only appointment so that we can recheck your potassium levels.  You should also schedule an appointment with your primary care doctor for a blood pressure recheck and to investigate the cause of your unintentional weight loss and depressive symptoms.  POST-HOSPITAL & CARE INSTRUCTIONS You have been discharged with instructions to take 20 mEq of potassium twice a day. We have stopped your hydrochlorothiazide.  We think this was contributing to your low potassium levels. Seek evaluation if your muscle aches get worse, you develop chest pains or palpitations, or you have any other concerning symptoms. Please schedule an appointment with your primary care provider to follow-up on your blood pressure since we are stopping one of your medications.  It would also be good to discuss your unintentional weight loss and depressive symptoms further. Go to your follow up appointments (listed below)  DOCTOR'S APPOINTMENT   Future Appointments  Date Time Provider Department Center  11/08/2021 10:30 AM [facility]  11/12/2021  2:30 PM Levin Erp, MD [facility]  11/29/2021  8:45 AM Asencion Islam, DPM [facility]    Follow-up Information     [facility]. Schedule an appointment as soon as possible for a visit.   Why: SOmeone should contact you from  [facility] to arrange an appointment, If you have not heard from them within 3 busiess days call [phone]        Levin Erp, MD. Go on 11/12/2021.   Specialty: Family Medicine Why: At 2:30 pm. This is your hospital follow up appointment at the family medicine clinic. Your PCP is unavailable, so you will see Dr. Laroy Apple. If this day and time does not work for you, please call the office directly to reschedule. Contact information: [address] [place] [address] [phone]         [facility] [employer]. Go on 11/08/2021.   Specialty: Cardiology Why: At 1:30pm. This is your previously scheduled lab appointment to check your cholesterol. We will ask them to check your potassium at the same time. Please ask to make sure they collect a potassium lab. Contact information: [address], [address] [place] [address] [phone]                Take care and be well!  Family Medicine Teaching Service Inpatient Team [place]  [facility]  [address] [place], [place] [address] [phone]

## 2021-11-03 NOTE — Progress Notes (Signed)
FPTS Brief Note Reviewed patient's vitals, recent notes.  Vitals:   11/02/21 1700 11/02/21 2027  BP: (!) 125/57 (!) 118/49  Pulse:  78  Resp: 17 18  Temp: 98.8 F (37.1 C) 98.2 F (36.8 C)  SpO2: 96% 90%   Started back on LF with 20 Kcl to help with mild hypokalemia and AKI that is resolving. Timed for 12 hours. If morning BMP is improved, may d/c fluids early. If renal function and potassium stable this morning, may d/c today.   At this time, no additional change in plan from day progress note.  Sabino Dick, DO Page [phone] with questions about this patient.

## 2021-11-03 NOTE — Progress Notes (Signed)
Patient is currently admitted for hypokalemia.  Anticipate that she will be discharged later today. Future order for BMP placed to evaluate potassium level outpatient.

## 2021-11-03 NOTE — Progress Notes (Signed)
Family Medicine Teaching Service Daily Progress Note Intern Pager: [phone]  Patient name: Meredith [name] Medical record number: [id number] Date of birth: 09/22/1951 Age: 70 y.o. Gender: female  Primary Care Provider: Ezequiel Essex, MD Consultants: Vascular (curb-sided) Code Status: FULL CODE  Pt Overview and Major Events to Date:  11/14: Admitted for severe hypokalemia with a potassium of 2  Assessment and Plan: [name] is a 70 y.o. female who was admitted for diffuse upper extremity myalgias secondary to her severe hypokalemia.  PMHx is significant for persistent atrial fibrillation on Xarelto, type 2 diabetes mellitus, CAD, hypertension, hyperlipidemia, GERD, MDD, and PTSD.  Myalgias 2/2 Hypokalemia: Improving She started back on LR with 20 KCl overnight.  Her potassium has been slowly improving with ongoing repletion.  Her HCTZ has been held throughout admission.  Today potassium 4.3 -Recommend discharging with 20 mEq of potassium twice daily -Recommend close follow-up with PCP for repeat labs -Discontinuing HCTZ -Will DC aspirin as no concern for vertebral dissection -Stable for d/c but reports transportation issues, likely early d/c tomorrow 11/27  Non-Oliguric AKI on CKD Stage 22: Improving  Placed back on fluids overnight.  Creatinine has improved from 1.38>1.34. 1.6L UOP.  -Stop IVF -Encourage PO intake   Constipation: Acute, improving Bowel regimen was increased yesterday. Reports no bowel movement still -Continue MiraLAX  -Will increase senna to 2 tablets -Encouraged mobility  Persistent atrial fibrillation on Xarelto  history of atrial tachycardia She is NSR this morning, HR 60s.  -Continue metoprolol succinate, Xarelto -Follow-up at regularly scheduled  Hypertension: Chronic, stable Systolics ranging 123XX123 and diastolics ranging 123XX123. -Discontinuing HCTZ -Continue metoprolol succinate, amlodipine  Type 2 DM: Chronic, stable  Did not require  any sliding scale coverage yesterday. -D/C CBG  -On discharge resume home metformin  MDD  PTSD Continue to follow-up mood outpatient  Unintentional Weight Loss Should continue to be worked up outpatient.  Would suggest Pap smear and colon cancer screening to start as patient is very behind.  FEN/GI: Heart Healthy/Carb Modified PPx: Xarelto Dispo:Home tomorrow. Barriers include: assistance at home (son is out of town and will return tomorrow).   Subjective:  Patient feels fine this morning. Her neck pains are still improving. She reports that she would rather wait until tomorrow to go home as her son is out of town for work and will return tomorrow. She does not want to have to worry about lifting anything and would like his assistance at home.   Reports still no BM. She reports that she is mobilizing well. No abdominal pain.   Objective: Temp:  [97.4 F (36.3 C)-98.8 F (37.1 C)] 97.4 F (36.3 C) (11/26 0403) Pulse Rate:  [69-78] 78 (11/25 2027) Resp:  [15-18] 17 (11/26 0403) BP: (118-129)/(49-75) 129/75 (11/26 0403) SpO2:  [90 %-97 %] 97 % (11/26 0403) Physical Exam: General: Awake, alert, in NAD, pleasant and cooperative Cardiovascular: RRR, HR 60s on tele, 2+ radial and DP pulses b/l  Respiratory: CTAB without wheezing/rhonchi/rales, normal effort Abdomen: obese, abdomen is soft, non-tender in all quadrants, non-distended Extremities: without edema   Laboratory: Recent Labs  Lab 10/31/21 1934 11/01/21 0459  WBC 10.1 8.0  HGB 13.9 13.4  HCT 41.0 39.0  PLT 235 190   Recent Labs  Lab 11/02/21 0254 11/02/21 1414 11/03/21 0254  NA 138 137 137  K 2.9* 3.6 4.3  CL 107 103 109  CO2 24 25 22   BUN 11 11 12   CREATININE 1.55* 1.38* 1.34*  CALCIUM 9.5 9.8 9.0  GLUCOSE 102* 152* 109*    Imaging/Diagnostic Tests: No results found.   Sabino Dick, DO 11/03/2021, 5:06 AM PGY-2, [place] FPTS Intern pager: [phone], text pages welcome

## 2021-11-03 NOTE — Discharge Summary (Signed)
[facility] Discharge Summary  Patient name: Meredith [name] Medical record number: [id number] Date of birth: 09-22-1951 Age: 70 y.o. Gender: female Date of Admission: 10/31/2021  Date of Discharge: 11/04/21 Admitting Physician: Merrily Brittle, DO  Primary Care Provider: Ezequiel Essex, MD Consultants: Vascular Surgery was curb-sided  Indication for Hospitalization: Severe Hypokalemia  Discharge Diagnoses/Problem List:  Principal Problem:   Hypokalemia Active Problems:   Diabetes mellitus type II, controlled, with no complications ([place])   HYPERCHOLESTEROLEMIA   HYPERTENSION, BENIGN SYSTEMIC   GASTROESOPHAGEAL REFLUX, NO ESOPHAGITIS   PAC (premature atrial contraction)   Persistent atrial fibrillation (HCC)   CAD (coronary artery disease), native coronary artery   Posterior neck pain   Torticollis, acute  Disposition: Home  Discharge Condition: Stable  Discharge Exam:  Blood pressure 107/66, pulse 66, temperature 98.5 F (36.9 C), temperature source Oral, resp. rate 18, height 5' 2" (1.575 m), weight 93.8 kg, SpO2 98 %. Physical Exam: General: sleeping comfortably, easily awoken, pleasant and cooperative Cardiovascular: Atrial fibrillation, HR 60s Respiratory: CTAB Extremities: no BLE edema  Brief Hospital Course:  Meredith [name] is a 70 y.o. female who was admitted to [facility] with complaints of neck, shoulder, and back pain and found to be severely hypokalemic and in atrial fibrillation.  Below is her hospital course listed by problem.  Refer to the H&P for additional information.  Severe Hypokalemia  Myalgias Patient presented to the emergency department with complaints of neck, upper back, and bilateral shoulder pain.  ED work-up revealed a potassium of 2.  She was repleted with IV and p.o. potassium with improvement to 4.3 on discharge.  It was thought that her muscle aches were related to her hypokalemia as CT head, C-spine, and  MRI were negative for acute findings.  She has a history of longstanding hypokalemia and is on potassium supplementation at home.  It was also thought that her acute hypokalemia may have been due to overdiuresis with HCTZ and in the setting of dehydration and poor PO intake. Her HCTZ was discontinued at discharge.  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 Likely in the setting of over-diuresis and dehydration, as stated above. She was provided IV LR at maintenance rate with improvement to a Creatinine of 1.34 at time of discharge.   Persistent Atrial Fibrillation On arrival to the ED, EKG showed rate-controlled atrial fibrillation. She was continued on her Xarelto, and Toprol-XL.   Hypertension Continued on home toprol-xl and amlodipine while inpatient. HCTZ was held 2/2 hypokalemia. Her blood pressures were normotensive during admission.   Depression  PTSD  Patient shared history of multiple traumas including the loss of her son to murder in 1993 and the recent loss of her nephew one month ago. She was tearful in discussing these events and admitted to having some hallucinations of seeing her son. She was recommended to start an SSRI but declined. Continue to follow up mood symptoms outpatient, she would definitely benefit from both counseling and anti-depressants.   Unintentional Weight Loss Reported unintentional weight loss and decreased appetite.  Unclear how much weight she has lost and in what amount of time.  She did appear significantly depressed, so this could be related.  However, it is concerning that she is not up-to-date on her cancer screenings and would benefit from further work-up outpatient for this if the weight loss is significant.  Other problems were chronic and stable.   Issues for PCP  Recheck BMP; ensure potassium is within normal limits. May need to adjust home potassium regimen. She was d/c with instructions to go back  on her home regimen of 20 mEq Kcl BID.  BP check. HCTZ was discontinued on discharge.  Consider starting an aldosterone receptor antagonist (such as spirolactone) if she continues to have hypokalemia and hypertension. Follow up on mood, she would benefit from SSRI and therapy for her depression/PTSD Consider colonoscopy to r/o colon cancer given extensive weight loss.   Significant Procedures: Imaging is below. No other procedures performed.   Significant Labs and Imaging:  Recent Labs  Lab 10/31/21 1934 11/01/21 0459  WBC 10.1 8.0  HGB 13.9 13.4  HCT 41.0 39.0  PLT 235 190   Recent Labs  Lab 10/31/21 1934 11/01/21 0459 11/01/21 2021 11/02/21 0254 11/02/21 0607 11/02/21 1414 11/03/21 0254 11/04/21 0218  NA 138   < > 137 138  --  137 137 137  K 2.0*   < > 3.0* 2.9*  --  3.6 4.3 4.6  CL 96*   < > 103 107  --  103 109 109  CO2 29   < > 25 24  --  _0 GLUCOSE 150*   < > 155* 102*  --  152* 109* 125*  BUN 19   < > 13 11  --  _1 CREATININE 2.00*   < > 1.53* 1.55*  --  1.38* 1.34* 1.24*  CALCIUM 10.2   < > 9.2 9.5  --  9.8 9.0 9.0  MG 2.1  --   --   --  2.0  --   --   --   PHOS  --   --  1.4*  --   --   --   --   --   ALBUMIN  --   --  3.0*  --   --   --   --   --    < > = values in this interval not displayed.   CXR 11/23 IMPRESSION: No active cardiopulmonary disease  CT Head without Contrast  CT C-spine 11/23 IMPRESSION: CT of the head: No acute intracranial abnormality noted.   CT of the cervical spine: Multilevel degenerative change without acute abnormality.   MRA Head/Neck 11/24 IMPRESSION: Intracranial MRA: 1. No emergent finding or flow limiting stenosis of major vessels. 2. 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 Neck MRA: 1. Thready flow in the left V1 and V2 segments. This could be related to severe origin stenosis with downstream underfilling, but given the extent and setting of neck pain, vertebral dissection is also  a strong consideration. 2. The other major neck arteries are widely patent.   Carotid Doppler 11/24 Summary:  Right Carotid: The extracranial vessels were near-normal with only minimal wall thickening or plaque.  Left Carotid: The extracranial vessels were near-normal with only minimal wall thickening or plaque.  Vertebrals:  Bilateral vertebral arteries demonstrate antegrade flow. Subclavians: Normal flow hemodynamics were seen in bilateral subclavian arteries.   Results/Tests Pending at Time of Discharge: None  Discharge Medications:  Allergies as of 11/04/2021       Reactions   Lisinopril Anaphylaxis, Shortness Of Breath, Swelling   Mouth and throat became swollen   Doxycycline Swelling   Possibly caused swelling of lips and hives in May 2019, timeline unclear   Flexeril [cyclobenzaprine Hcl] Itching, Other (See Comments)   Welts also   Lipitor [atorvastatin Calcium] Other (See  Comments)   Myalgia   Metaxalone Itching, Other (See Comments)   Welts also        Medication List     STOP taking these medications    hydrochlorothiazide 25 MG tablet Commonly known as: HYDRODIURIL   traMADol 50 MG tablet Commonly known as: ULTRAM       TAKE these medications    Accu-Chek Aviva Plus test strip Generic drug: glucose blood USE AS INSTRUCTED TEST BLOOD GLUCOSE ONCE DAILY. ICD-10 CODE: E11.9   Accu-Chek Aviva Plus w/Device Kit 1 kit by Does not apply route as directed. ICD-10 code: E11.9   Accu-Chek Softclix Lancets lancets Use as instructed to test blood glucose once daily. ICD-10 code: E11.9   acetaminophen 650 MG CR tablet Commonly known as: Acetaminophen 8 Hour Take 1 tablet (650 mg total) by mouth every 8 (eight) hours. What changed:  when to take this reasons to take this   amLODipine 10 MG tablet Commonly known as: NORVASC Take 1 tablet (10 mg total) by mouth daily.   canagliflozin 100 MG Tabs tablet Commonly known as: INVOKANA Take 1 tablet (100 mg  total) by mouth daily. Take before first meal of day for four weeks. Return to doctor within 3rd or 4th week. What changed: additional instructions   diclofenac sodium 1 % Gel Commonly known as: VOLTAREN Apply 2 g topically 4 (four) times daily. What changed:  when to take this reasons to take this   ezetimibe 10 MG tablet Commonly known as: ZETIA Take 1 tablet (10 mg total) by mouth daily.   fexofenadine 180 MG tablet Commonly known as: ALLEGRA Take 1 tablet (180 mg total) by mouth daily. What changed:  when to take this reasons to take this   [place] Urea 40 % ointment Generic drug: urea Apply topically as needed. What changed:  how much to take reasons to take this   Lido-Capsaicin-Men-Methyl Sal 0.5-0.035-5-20 % Ptch Apply 1 each topically daily as needed. What changed: reasons to take this   Lidocaine 4 % Ptch Commonly known as: HM Lidocaine Patch Apply 1 each topically daily as needed. What changed: reasons to take this   metFORMIN 1000 MG tablet Commonly known as: GLUCOPHAGE Take 1 tablet twice daily with food. What changed:  how much to take how to take this when to take this   metoprolol succinate 50 MG 24 hr tablet Commonly known as: TOPROL-XL Take 1 tablet (50 mg total) by mouth daily. TAKE WITH OR IMMEDIATELY FOLLOWING A MEAL.   potassium chloride 10 MEQ tablet Commonly known as: KLOR-CON TAKE 2 TABLETS BY MOUTH TWICE A DAY What changed: how to take this   rivaroxaban 20 MG Tabs tablet Commonly known as: Xarelto TAKE 1 TABLET BY MOUTH EVERY DAY WITH SUPPER What changed:  how much to take when to take this additional instructions   rosuvastatin 40 MG tablet Commonly known as: CRESTOR Take 1 tablet (40 mg total) by mouth daily. What changed: when to take this   triamcinolone ointment 0.5 % Commonly known as: KENALOG Apply 1 application topically 2 (two) times daily. For moderate to severe eczema.  Do not use for more than 1 week at a  time.         Discharge Instructions: Please refer to Patient Instructions section of EMR for full details.  Patient was counseled important signs and symptoms that should prompt return to medical care, changes in medications, dietary instructions, activity restrictions, and follow up appointments.   Follow-Up Appointments:  Follow-up Information     [facility]. Schedule an appointment as soon as possible for a visit.   Why: SOmeone should contact you from [facility] to arrange an appointment, If you have not heard from them within 3 busiess days call [phone]        Gerrit Heck, MD. Go on 11/12/2021.   Specialty: Family Medicine Why: At 2:30 pm. This is your hospital follow up appointment at the family medicine clinic. Your PCP is unavailable, so you will see Dr. Jinny Sanders. If this day and time does not work for you, please call the office directly to reschedule. Contact information: [place] [address] [phone]         [facility]. Go on 11/08/2021.   Specialty: Cardiology Why: At 1:30pm. This is your previously scheduled lab appointment to check your cholesterol. We will ask them to check your potassium at the same time. Please ask to make sure they collect a potassium lab. Contact information: [address] [place]                Ezequiel Essex, MD 11/04/2021, 6:55 AM PGY-2, [place]

## 2021-11-04 ENCOUNTER — Other Ambulatory Visit: Payer: Self-pay | Admitting: Family Medicine

## 2021-11-04 DIAGNOSIS — E876 Hypokalemia: Secondary | ICD-10-CM

## 2021-11-04 LAB — BASIC METABOLIC PANEL
Anion gap: 5 (ref 5–15)
BUN: 12 mg/dL (ref 8–23)
CO2: 23 mmol/L (ref 22–32)
Calcium: 9 mg/dL (ref 8.9–10.3)
Chloride: 109 mmol/L (ref 98–111)
Creatinine, Ser: 1.24 mg/dL — ABNORMAL HIGH (ref 0.44–1.00)
GFR, Estimated: 47 mL/min — ABNORMAL LOW (ref >60)
Glucose, Bld: 125 mg/dL — ABNORMAL HIGH (ref 70–99)
Potassium: 4.6 mmol/L (ref 3.5–5.1)
Sodium: 137 mmol/L (ref 135–145)

## 2021-11-04 LAB — GLUCOSE, CAPILLARY: Glucose-Capillary: 133 mg/dL — ABNORMAL HIGH (ref 70–99)

## 2021-11-04 NOTE — Plan of Care (Signed)

## 2021-11-04 NOTE — Progress Notes (Signed)
FPTS Brief Progress Note  BP 107/66 (BP Location: Left Arm)   Pulse 66   Temp 98.5 F (36.9 C) (Oral)   Resp 18   Ht 5\' 2"  (1.575 m)   Wt 93.8 kg   SpO2 98%   BMI 37.84 kg/m    A/P:  Hypokalemia: Resolved Plan for AM BMP prior to discharge. Plan for lab recheck 12/1 and PCP appointment 12/5. Will be ready for early discharge Sunday morning.    Non-Oliguric AKI on CKD Stage 2: Improving  Baseline creatinine appears to be around 1.0. Improving s/p IVF and PO hydration. Recommend PCP to recheck at follow up.    Constipation: Acute, improving No BM recorded in I/Os.    Persistent atrial fibrillation on Xarelto  history of atrial tachycardia HR 66 this evening.    Hypertension: Chronic, stable Patient normotensive this evening. Continue metoprolol succ, amlodipine.      - Orders reviewed. Labs for AM ordered, which was adjusted as needed.     Saturday, MD 11/04/2021, 1:39 AM PGY-2, [place] [facility] Night Resident  Please page [phone] with questions.

## 2021-11-04 NOTE — Progress Notes (Signed)
Potassium lab order. Add on, lab collect. Patient previously scheduled 12/1 10:30 am at [facility] lab to have labs drawn. Will ask that this be added on to check her potassium, given recently hospitalization for hypokalemia.   Fayette Pho, MD

## 2021-11-05 ENCOUNTER — Other Ambulatory Visit: Payer: Self-pay | Admitting: Cardiology

## 2021-11-08 ENCOUNTER — Other Ambulatory Visit: Payer: Self-pay

## 2021-11-08 ENCOUNTER — Other Ambulatory Visit: Payer: Medicare HMO

## 2021-11-08 DIAGNOSIS — E7800 Pure hypercholesterolemia, unspecified: Secondary | ICD-10-CM

## 2021-11-08 LAB — LIPID PANEL
Chol/HDL Ratio: 3.5 ratio (ref 0.0–4.4)
Cholesterol, Total: 137 mg/dL (ref 100–199)
HDL: 39 mg/dL — ABNORMAL LOW (ref >39)
LDL Chol Calc (NIH): 78 mg/dL (ref 0–99)
Triglycerides: 111 mg/dL (ref 0–149)
VLDL Cholesterol Cal: 20 mg/dL (ref 5–40)

## 2021-11-08 LAB — ALT: ALT: 14 IU/L (ref 0–32)

## 2021-11-12 ENCOUNTER — Encounter: Payer: Self-pay | Admitting: Student

## 2021-11-12 ENCOUNTER — Other Ambulatory Visit: Payer: Self-pay

## 2021-11-12 ENCOUNTER — Ambulatory Visit (INDEPENDENT_AMBULATORY_CARE_PROVIDER_SITE_OTHER): Payer: Medicare HMO | Admitting: Student

## 2021-11-12 VITALS — BP 121/68 | HR 64 | Ht 62.0 in | Wt 212.2 lb

## 2021-11-12 DIAGNOSIS — E876 Hypokalemia: Secondary | ICD-10-CM | POA: Diagnosis not present

## 2021-11-12 DIAGNOSIS — Z0000 Encounter for general adult medical examination without abnormal findings: Secondary | ICD-10-CM

## 2021-11-12 DIAGNOSIS — I4819 Other persistent atrial fibrillation: Secondary | ICD-10-CM

## 2021-11-12 DIAGNOSIS — I10 Essential (primary) hypertension: Secondary | ICD-10-CM

## 2021-11-12 NOTE — Progress Notes (Signed)
    SUBJECTIVE:   CHIEF COMPLAINT / HPI: Hospital f/u  Hospital f/u-Hypokalemia  Admitted from 11/23-11/27 for severe hypokalemia and muscle pains. Has been taking home potassium of 20 meq BID (40 meq daily). Still has pain in neck but has PT follow-up scheduled. Denies chest pain, palpitations, shortness of breath, lightheadedness.  Pulse was 64 today.  Persistent atrial fibrillation Has been taking metoprolol, Xarelto.  Has not been feeling palpitations or chest pain.  HTN HCTZ was discontinued on discharge from hospital.  Blood pressure today was 121/68.  Denies any headaches, vision changes, chest pain, or lightheadedness.    MDD Lives at home by herself.  Does feel sad about her son's murder in 1993.  Is currently not interested in any antidepressants or therapy/counseling.  She has her other children who live in the area and who contacted her.  Denies any SI.  Health maintenance Patient has stool testing kit at home to screen for colon cancer but does not know how to this.  PERTINENT  PMH / PSH: MDD  OBJECTIVE:   BP 121/68   Pulse 64   Ht $R'5\' 2"'Bb$  (1.575 m)   Wt 212 lb 3.2 oz (96.3 kg)   SpO2 100%   BMI 38.81 kg/m   General: NAD, awake, alert, responsive to questions Head: Normocephalic atraumatic CV: Irregularly irregular rhythm, normal rate no murmurs rubs or gallops, 2+ radial pulses Respiratory: Clear to ausculation bilaterally, no wheezes rales or crackles no increased work of breathing Abdomen: Soft, non-tender, non-distended, normoactive bowel sounds  Extremities: Moves upper and lower extremities freely, no edema in LE, able to sit up on table without assistance. Great toe without warmth or erythema Neuro: No focal deficits Skin: No rashes or lesions visualized   ASSESSMENT/PLAN:   Persistent atrial fibrillation (HCC) Pulse 64. -Continue metoprolol 50 mg daily -Continue Xarelto 20 mg daily  Hypokalemia BMP today, will recheck K.  Hypokalemia likely  secondary to hydrochlorothiazide use prior. -Continue 20 mEq potassium twice daily (40 mg daily) but anticipate decreasing based on BMP today  DEPRESSION, MAJOR, RECURRENT Currently not interested in antidepressant or therapy/counseling.  Endorses no SI. -Monitor  Healthcare maintenance Declines colonoscopy currently.  Has stool testing at home but unsure how to perform. -Nurse visit to advise on how to perform  HYPERTENSION, BENIGN SYSTEMIC 121/68 today. -Continue amlodipine 10 mg daily -Continue metoprolol 50 mg daily    Gerrit Heck, MD [place]

## 2021-11-12 NOTE — Assessment & Plan Note (Signed)
121/68 today. -Continue amlodipine 10 mg daily -Continue metoprolol 50 mg daily

## 2021-11-12 NOTE — Assessment & Plan Note (Signed)
Currently not interested in antidepressant or therapy/counseling.  Endorses no SI. -Monitor

## 2021-11-12 NOTE — Assessment & Plan Note (Signed)
Pulse 64. -Continue metoprolol 50 mg daily -Continue Xarelto 20 mg daily

## 2021-11-12 NOTE — Assessment & Plan Note (Signed)
Declines colonoscopy currently.  Has stool testing at home but unsure how to perform. -Nurse visit to advise on how to perform

## 2021-11-12 NOTE — Assessment & Plan Note (Signed)
BMP today, will recheck K.  Hypokalemia likely secondary to hydrochlorothiazide use prior. -Continue 20 mEq potassium twice daily (40 mg daily) but anticipate decreasing based on BMP today

## 2021-11-12 NOTE — Patient Instructions (Signed)
It was great to see you! Thank you for allowing me to participate in your care!   I recommend that you always bring your medications to each appointment as this makes it easy to ensure we are on the correct medications and helps [place] not miss when refills are needed.  Our plans for today:  - We are going to check you potassium level today, I will call you with the results tomorrow and let you know whether we can reduce your potassium tablets - For your stool test, you would have to bring this box into the clinic at a nurse visit in order to assist you with completing this - Your blood pressure was great today so we will continue with the medications you are on  We are checking some labs today, I will call you if they are abnormal will send you a MyChart message or a letter if they are normal.  If you do not hear about your labs in the next 2 weeks please let us know.  Take care and seek immediate care sooner if you develop any concerns. Please remember to show up 15 minutes before your scheduled appointment time!  Levin Erp, MD [facility]

## 2021-11-13 LAB — BASIC METABOLIC PANEL
BUN/Creatinine Ratio: 12 (ref 12–28)
BUN: 13 mg/dL (ref 8–27)
CO2: 19 mmol/L — ABNORMAL LOW (ref 20–29)
Calcium: 10.8 mg/dL — ABNORMAL HIGH (ref 8.7–10.3)
Chloride: 106 mmol/L (ref 96–106)
Creatinine, Ser: 1.12 mg/dL — ABNORMAL HIGH (ref 0.57–1.00)
Glucose: 101 mg/dL — ABNORMAL HIGH (ref 70–99)
Potassium: 5.3 mmol/L — ABNORMAL HIGH (ref 3.5–5.2)
Sodium: 144 mmol/L (ref 134–144)
eGFR: 53 mL/min/{1.73_m2} — ABNORMAL LOW (ref >59)

## 2021-11-13 NOTE — Progress Notes (Signed)
Called patient and let her know potassium is high at 5.3. Told her to cut potassium tablets by half (2 tablets in morning instead). Scheduled nurse visit for 12/16 at 2 pm for K recheck and stool kit education.  

## 2021-11-22 ENCOUNTER — Other Ambulatory Visit: Payer: Self-pay

## 2021-11-22 ENCOUNTER — Other Ambulatory Visit: Payer: Medicare HMO

## 2021-11-22 ENCOUNTER — Ambulatory Visit: Payer: Medicare HMO | Attending: Family Medicine

## 2021-11-22 DIAGNOSIS — R29898 Other symptoms and signs involving the musculoskeletal system: Secondary | ICD-10-CM | POA: Diagnosis not present

## 2021-11-22 DIAGNOSIS — E876 Hypokalemia: Secondary | ICD-10-CM

## 2021-11-22 DIAGNOSIS — M25519 Pain in unspecified shoulder: Secondary | ICD-10-CM | POA: Insufficient documentation

## 2021-11-22 DIAGNOSIS — R293 Abnormal posture: Secondary | ICD-10-CM | POA: Insufficient documentation

## 2021-11-22 DIAGNOSIS — M542 Cervicalgia: Secondary | ICD-10-CM | POA: Diagnosis not present

## 2021-11-22 NOTE — Therapy (Addendum)
OUTPATIENT PHYSICAL THERAPY CERVICAL EVALUATION/Discharge PHYSICAL THERAPY DISCHARGE SUMMARY  Visits from Start of Care: 1-Eval  Current functional level related to goals / functional outcomes: Unknown   Remaining deficits: Unknown   Education / Equipment: HEP   Patient agrees to discharge. Patient goals were not met. Patient is being discharged due to not returning since the last visit.  Gar Ponto MS, PT 01/11/22 8:41 AM     Patient Name: Meredith [name] MRN: [id number] DOB:1951-07-04, 70 y.o., female Today's Date: 11/23/2021   PT End of Session - 11/23/21 1358     Visit Number 1    Number of Visits 7    Date for PT Re-Evaluation 01/11/22    Authorization Type [employer]IC[employer]    PT Start Time [phone]    PT Stop Time 1020    PT Time Calculation (min) 47 min    Activity Tolerance Patient tolerated treatment well    Behavior During Therapy [facility] for tasks assessed/performed             Past Medical History:  Diagnosis Date   ANEMIA, IRON DEFICIENCY, UNSPEC. 02/05/2007   Qualifier: Diagnosis of  By: Damita Dunnings MD, Phillip Heal     ASTHMA, INTERMITTENT 09/07/2010   Qualifier: Diagnosis of  By: Jess Barters MD, Erik     CAD (coronary artery disease), native coronary artery 02/16/2015   Cath with 20% LCx and RCA   Colon polyps    CTS (carpal tunnel syndrome) 06/06/2016   Diabetes mellitus    Diverticulosis 05/17/12   DJD (degenerative joint disease) of lumbar spine    Enteritis    Gastric ulcer 04/2000   Hyperlipidemia    Hypertension    Irregular heart beat    Obesity    Osteoarthritis    Persistent atrial fibrillation (HCC)    CHADS2VASC score is 5 and on Xarelto   Pruritus 02/21/2021   Rash 02/15/2021   Renal cyst, left 05/17/12   Ventral hernia    Past Surgical History:  Procedure Laterality Date   ABDOMINAL HYSTERECTOMY     LEFT HEART CATHETERIZATION WITH CORONARY ANGIOGRAM N/A 02/16/2015   Procedure: LEFT HEART CATHETERIZATION WITH CORONARY ANGIOGRAM;  Surgeon:  Burnell Blanks, MD;  Location: [facility] CATH LAB;  Service: Cardiovascular;  Laterality: N/A;   OPERATIVE HYSTEROSCOPY     TUBAL LIGATION     UTERINE FIBROID SURGERY     Patient Active Problem List   Diagnosis Date Noted   Posterior neck pain 11/03/2021   Torticollis, acute    Ingrown nail 08/16/2021   COVID-19 vaccination not done 08/16/2021   Colon cancer screening 08/16/2021   Influenza vaccination declined 08/16/2021   Tear of rotator cuff 05/04/2021   Leg pain, lateral, right 12/31/2019   Close exposure to COVID-19 virus 06/17/2019   Hypokalemia 12/04/2018   Restless leg syndrome 12/04/2018   Osteopenia 09/15/2017   Pain of both hip joints 06/13/2017   Knee pain, chronic 07/21/2016   Numbness of fingers 04/06/2016   Estrogen deficiency 03/29/2016   CAD (coronary artery disease), native coronary artery 10/26/2015   Back pain 08/17/2015   Persistent atrial fibrillation ([place]) 02/23/2015   PAC (premature atrial contraction) 01/19/2015   Healthcare maintenance 03/04/2014   HERNIA, VENTRAL 04/18/2010   DEGENERATIVE DISC DISEASE, LUMBAR SPINE 04/18/2010   Diabetes mellitus type II, controlled, with no complications ([place]) 16/12/930   HYPERCHOLESTEROLEMIA 02/05/2007   Obesity 02/05/2007   DEPRESSION, MAJOR, RECURRENT 02/05/2007   HYPERTENSION, BENIGN SYSTEMIC 02/05/2007   GASTROESOPHAGEAL REFLUX, NO ESOPHAGITIS  02/05/2007   OSTEOARTHRITIS, MULTI SITES 02/05/2007    PCP: Ezequiel Essex, MD  REFERRING PROVIDER: Ezequiel Essex, MD  REFERRING DIAG: neck and shoulder pain  THERAPY DIAG: neck and shoulder pain  ONSET DATE: 10/31/21  SUBJECTIVE:                                                                                                                                                                                                         SUBJECTIVE STATEMENT: Was admitted to the hospital due to neck pain. Currently, it hurts every now and then. 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 She notes It does not hurt everyday and her neck exs help (cervical rotation). It is painful about 2 to 3x a week. Marland Kitchen  PERTINENT HISTORY:  OA, DM, CAD, asthma  PAIN:  Are you having pain? No VAS scale: 0/10 currently, 0-8/10 Pain location: L lower cervical/L upper shoulder Pain orientation: Left  PAIN TYPE: aching and burning Pain description: intermittent  Aggravating factors: work Relieving factors: exs, heating  PRECAUTIONS: None  WEIGHT BEARING RESTRICTIONS No  FALLS:  Has patient fallen in last 6 months? Yes, at work, a slip Number of falls: 1 It has not limited her activity level  LIVING ENVIRONMENT: Lives with: lives with their family and lives with their son Lives in: House/apartment Stairs: Yes; External: 4 steps; Rail on R going up Has following equipment at home: Single point cane  OCCUPATION: Food line server/check out at [employer]  PLOF: Independent with community mobility with device  PATIENT GOALS Pain relief  OBJECTIVE:   DIAGNOSTIC FINDINGS:  IMPRESSION: CT of the head: No acute intracranial abnormality noted.   CT of the cervical spine: Multilevel degenerative change without acute abnormality.  COGNITION: Overall cognitive status: Within functional limits for tasks assessed   SENSATION: Light touch: Appears intact, prior hx of tingling of the R hand  POSTURE:  Forward head and rounded shoulders   CERVICAL AROM/PROM  A/PROM A/PROM (deg) 11/23/2021  Flexion 42  Extension 43  Right lateral flexion 20  Left lateral flexion 10  Right rotation 50  Left rotation 52   Increased L cervical pain c L LF  UE AROM/PROM:  Bilat UE grossly WFLs and equal  UE MMT:   UE myotomal screen is neg  CERVICAL SPECIAL TESTS:  Spurling's test: Positive L  SPINAL SEGMENTAL MOBILITY:  NT  FUNCTIONAL TESTS:  NT  PATIENT SURVEYS:  FOTO To be completed next  session  TODAY'S  TREATMENT:  Seated cervical retraction x10, 3" Seated scapular retraction x10, 3" Seated cervical rotation x5   PATIENT EDUCATION:  Education details: Eval findings, POC, HEP Person educated: Patient Education method: Explanation, Demonstration, Tactile cues, and Verbal cues, handout Education comprehension: verbalized understanding, returned demonstration, verbal cues required, and tactile cues required   HOME EXERCISE PROGRAM:  [id number]      ASSESSMENT:  CLINICAL IMPRESSION: Patient is a 70 y.o. F who was seen today for physical therapy evaluation and treatment for neck and shoulder pain. Objective impairments include decreased ROM, decreased strength, impaired flexibility, and postural dysfunction, and pain impacting the pt's neck. L cervical flexion demonstrates the most limitation and reproduces pain most significantly. These impairments are limiting patient from occupation. Personal factors including Fitness, comorbidities- DM, OA are also affecting patient's functional outcome. Patient will benefit from skilled PT to address above impairments and improve overall function.  REHAB POTENTIAL: Good  CLINICAL DECISION MAKING: Stable/uncomplicated  EVALUATION COMPLEXITY: Low   GOALS:   SHORT TERM GOALS:  STG Name Target Date Goal status  1 Pt will be Ind in an initial HEP Baseline:  12/13/21 INITIAL  2 Pt will voice measues to assit in the decrease of her cervical pain Baseline:  12/13/21 INITIAL   LONG TERM GOALS:   LTG Name Target Date Goal status  1 Pt will report a decrease in her cervical pain to 3/10 or less with daily and work related activities Baseline:0-8/10 01/11/22 INITIAL  2 Pt will be able to demonstrate proper sitting posture Baseline: 01/11/22 INITIAL  3 Improve L cervical LF to 20d for improved functional neck mobility Baseline:10d 01/11/22 INITIAL  4 Pt will be Ind in a final HEP to maintain achieved LOF  Baseline: 01/11/22 INITIAL    PLAN: PT FREQUENCY: 1x/week  PT DURATION: 6 weeks  PLANNED INTERVENTIONS: Therapeutic exercises, Therapeutic activity, Neuro Muscular re-education, Patient/Family education, Joint mobilization, Dry Needling, Electrical stimulation, Spinal mobilization, Cryotherapy, Moist heat, Taping, Traction, Ultrasound, Ionotophoresis 12m/ml Dexamethasone, and Manual therapy  PLAN FOR NEXT SESSION: Complete FOTO, assess response to HEP, provided Ed for proper posture   ALiberty MutualMS, PT 11/23/21 2:01 PM  Referring diagnosis? Neck and shoulder pain Treatment diagnosis? (if different than referring diagnosis)  What was this (referring dx) caused by? '[]'  Surgery '[]'  Fall '[]'  Ongoing issue '[x]'  Arthritis '[]'  Other: ____________  Laterality: '[]'  Rt '[]'  Lt '[x]'  Both  Check all possible CPT codes:  *CHOOSE 10 OR LESS*    '[x]'  97110 (Therapeutic Exercise)  '[]'  92507 (SLP Treatment)  '[]'  97112 (Neuro Re-ed)   '[]'  92526 (Swallowing Treatment)   '[]'  97116 (Gait Training)   '[]'  [id number](Cognitive Training, 1st 15 minutes) '[x]'  97140 (Manual Therapy)   '[]'  97130 (Cognitive Training, each add'l 15 minutes)  '[x]'  97530 (Therapeutic Activities)  '[]'  Other, List CPT Code ____________    '[x]'  936644(Self Care)       '[]'  All codes above (97110 - 97535)  '[x]'  97012 (Mechanical Traction)  '[x]'  97014 (E-stim Unattended)  '[x]'  97032 (E-stim manual)  '[x]'  97033 (Ionto)  '[x]'  97035 (Ultrasound)  '[]'  97760 (Orthotic Fit) '[]'  [id number](Physical Performance Training) '[]'  [id number](Aquatic Therapy) '[]'  97034 (Contrast Bath) '[]'  [id number](Paraffin) '[]'  97597 (Wound Care 1st 20 sq cm) '[]'  97598 (Wound Care each add'l 20 sq cm) '[]'  97016 (Vasopneumatic Device) '[]'  903474(Orthotic Training) '[]'  [id number](Prosthetic Training)

## 2021-11-23 LAB — BASIC METABOLIC PANEL
BUN/Creatinine Ratio: 10 — ABNORMAL LOW (ref 12–28)
BUN: 14 mg/dL (ref 8–27)
CO2: 23 mmol/L (ref 20–29)
Calcium: 9.8 mg/dL (ref 8.7–10.3)
Chloride: 106 mmol/L (ref 96–106)
Creatinine, Ser: 1.35 mg/dL — ABNORMAL HIGH (ref 0.57–1.00)
Glucose: 137 mg/dL — ABNORMAL HIGH (ref 70–99)
Potassium: 3.9 mmol/L (ref 3.5–5.2)
Sodium: 143 mmol/L (ref 134–144)
eGFR: 42 mL/min/{1.73_m2} — ABNORMAL LOW (ref >59)

## 2021-11-23 LAB — POTASSIUM: Potassium: 4 mmol/L (ref 3.5–5.2)

## 2021-11-27 ENCOUNTER — Telehealth: Payer: Self-pay | Admitting: Student

## 2021-11-27 NOTE — Telephone Encounter (Signed)
Called patient and let her know to continue doing just 20 meq potassium daily as her potassium was 4.0.

## 2021-11-27 NOTE — Addendum Note (Signed)
Addended by: Joellyn Rued on: 11/27/2021 09:17 AM   Modules accepted: Orders

## 2021-11-29 ENCOUNTER — Other Ambulatory Visit: Payer: Self-pay

## 2021-11-29 ENCOUNTER — Encounter: Payer: Self-pay | Admitting: Sports Medicine

## 2021-11-29 ENCOUNTER — Other Ambulatory Visit: Payer: Self-pay | Admitting: Family Medicine

## 2021-11-29 ENCOUNTER — Ambulatory Visit (INDEPENDENT_AMBULATORY_CARE_PROVIDER_SITE_OTHER): Payer: Medicare HMO | Admitting: Sports Medicine

## 2021-11-29 DIAGNOSIS — B351 Tinea unguium: Secondary | ICD-10-CM

## 2021-11-29 DIAGNOSIS — E119 Type 2 diabetes mellitus without complications: Secondary | ICD-10-CM | POA: Diagnosis not present

## 2021-11-29 DIAGNOSIS — M79674 Pain in right toe(s): Secondary | ICD-10-CM

## 2021-11-29 DIAGNOSIS — M79675 Pain in left toe(s): Secondary | ICD-10-CM

## 2021-11-29 DIAGNOSIS — Z7901 Long term (current) use of anticoagulants: Secondary | ICD-10-CM

## 2021-11-29 NOTE — Progress Notes (Signed)
Subjective: Meredith [name] is a 70 y.o. female patient with history of diabetes who presents to office today complaining of long,mildly painful nails  while ambulating in shoes; unable to trim. Patient states that the glucose reading this morning was not recorded. Was in hospital for K+ issues.   Last PCP visit 3 weeks.   Patient Active Problem List   Diagnosis Date Noted   Posterior neck pain 11/03/2021   Torticollis, acute    Ingrown nail 08/16/2021   COVID-19 vaccination not done 08/16/2021   Colon cancer screening 08/16/2021   Influenza vaccination declined 08/16/2021   Tear of rotator cuff 05/04/2021   Leg pain, lateral, right 12/31/2019   Close exposure to COVID-19 virus 06/17/2019   Hypokalemia 12/04/2018   Restless leg syndrome 12/04/2018   Osteopenia 09/15/2017   Pain of both hip joints 06/13/2017   Knee pain, chronic 07/21/2016   Numbness of fingers 04/06/2016   Estrogen deficiency 03/29/2016   CAD (coronary artery disease), native coronary artery 10/26/2015   Back pain 08/17/2015   Persistent atrial fibrillation ([place]) 02/23/2015   PAC (premature atrial contraction) 01/19/2015   Healthcare maintenance 03/04/2014   HERNIA, VENTRAL 04/18/2010   DEGENERATIVE DISC DISEASE, LUMBAR SPINE 04/18/2010   Diabetes mellitus type II, controlled, with no complications ([place]) 58/85/0277   HYPERCHOLESTEROLEMIA 02/05/2007   Obesity 02/05/2007   DEPRESSION, MAJOR, RECURRENT 02/05/2007   HYPERTENSION, BENIGN SYSTEMIC 02/05/2007   GASTROESOPHAGEAL REFLUX, NO ESOPHAGITIS 02/05/2007   OSTEOARTHRITIS, MULTI SITES 02/05/2007   Current Outpatient Medications on File Prior to Visit  Medication Sig Dispense Refill   Accu-Chek Softclix Lancets lancets Use as instructed to test blood glucose once daily. ICD-10 code: E11.9 100 each 12   acetaminophen (ACETAMINOPHEN 8 HOUR) 650 MG CR tablet Take 1 tablet (650 mg total) by mouth every 8 (eight) hours. (Patient taking differently: Take 650 mg by  mouth every 8 (eight) hours as needed for pain.) 30 tablet 0   amLODipine (NORVASC) 10 MG tablet Take 1 tablet (10 mg total) by mouth daily. 90 tablet 3   Blood Glucose Monitoring Suppl (ACCU-CHEK AVIVA PLUS) w/Device KIT 1 kit by Does not apply route as directed. ICD-10 code: E11.9 1 kit 0   canagliflozin (INVOKANA) 100 MG TABS tablet Take 1 tablet (100 mg total) by mouth daily. Take before first meal of day for four weeks. Return to doctor within 3rd or 4th week. (Patient taking differently: Take 100 mg by mouth daily.) 90 tablet 3   diclofenac sodium (VOLTAREN) 1 % GEL Apply 2 g topically 4 (four) times daily. (Patient taking differently: Apply 2 g topically 4 (four) times daily as needed (knee pain).) 100 g 0   ezetimibe (ZETIA) 10 MG tablet Take 1 tablet (10 mg total) by mouth daily. 90 tablet 3   fexofenadine (ALLEGRA) 180 MG tablet Take 1 tablet (180 mg total) by mouth daily. (Patient taking differently: Take 180 mg by mouth daily as needed for allergies.) 30 tablet 11   glucose blood (ACCU-CHEK AVIVA PLUS) test strip USE AS INSTRUCTED TEST BLOOD GLUCOSE ONCE DAILY. ICD-10 CODE: E11.9 100 each 12   Lido-Capsaicin-Men-Methyl Sal 0.5-0.035-5-20 % PTCH Apply 1 each topically daily as needed. (Patient taking differently: Apply 1 each topically daily as needed (pain).) 60 patch 3   Lidocaine (HM LIDOCAINE PATCH) 4 % PTCH Apply 1 each topically daily as needed. (Patient taking differently: Apply 1 each topically daily as needed (pain).) 15 patch 12   metFORMIN (GLUCOPHAGE) 1000 MG tablet Take 1 tablet  twice daily with food. (Patient taking differently: Take 1,000 mg by mouth 2 (two) times daily with a meal. Take 1 tablet twice daily with food.) 180 tablet 3   metoprolol succinate (TOPROL-XL) 50 MG 24 hr tablet Take 1 tablet (50 mg total) by mouth daily. TAKE WITH OR IMMEDIATELY FOLLOWING A MEAL. 90 tablet 3   potassium chloride (KLOR-CON) 10 MEQ tablet Take 2 tablets (20 mEq total) by mouth 2 (two)  times daily. 360 tablet 2   rivaroxaban (XARELTO) 20 MG TABS tablet TAKE 1 TABLET BY MOUTH EVERY DAY WITH SUPPER (Patient taking differently: 20 mg daily with supper.) 90 tablet 3   rosuvastatin (CRESTOR) 40 MG tablet Take 1 tablet (40 mg total) by mouth daily. (Patient taking differently: Take 40 mg by mouth at bedtime.) 90 tablet 3   triamcinolone ointment (KENALOG) 0.5 % Apply 1 application topically 2 (two) times daily. For moderate to severe eczema.  Do not use for more than 1 week at a time. (Patient taking differently: Apply 1 application topically 2 (two) times daily. For moderate to severe eczema.  Do not use for more than 1 week at a time.) 60 g 3   urea (GORDONS UREA) 40 % ointment Apply topically as needed. (Patient taking differently: Apply 1 application topically as needed (itching).) 30 g 0   No current facility-administered medications on file prior to visit.   Allergies  Allergen Reactions   Lisinopril Anaphylaxis, Shortness Of Breath and Swelling    Mouth and throat became swollen   Doxycycline Swelling    Possibly caused swelling of lips and hives in May 2019, timeline unclear   Flexeril [Cyclobenzaprine Hcl] Itching and Other (See Comments)    Welts also   Lipitor [Atorvastatin Calcium] Other (See Comments)    Myalgia   Metaxalone Itching and Other (See Comments)    Welts also     Recent Results (from the past 2160 hour(s))  Resp Panel by RT-PCR (Flu A&B, Covid) Nasopharyngeal Swab     Status: None   Collection Time: 10/31/21  7:26 PM   Specimen: Nasopharyngeal Swab; Nasopharyngeal(NP) swabs in vial transport medium  Result Value Ref Range   SARS Coronavirus 2 by RT PCR NEGATIVE NEGATIVE    Comment: (NOTE) SARS-CoV-2 target nucleic acids are NOT DETECTED.  The SARS-CoV-2 RNA is generally detectable in upper respiratory specimens during the acute phase of infection. The lowest concentration of SARS-CoV-2 viral copies this assay can detect is 138 copies/mL. A  negative result does not preclude SARS-Cov-2 infection and should not be used as the sole basis for treatment or other patient management decisions. A negative result may occur with  improper specimen collection/handling, submission of specimen other than nasopharyngeal swab, presence of viral mutation(s) within the areas targeted by this assay, and inadequate number of viral copies(<138 copies/mL). A negative result must be combined with clinical observations, patient history, and epidemiological information. The expected result is Negative.  Fact Sheet for Patients:  EntrepreneurPulse.com.au  Fact Sheet for Healthcare Providers:  IncredibleEmployment.be  This test is no t yet approved or cleared by the [place] FDA and  has been authorized for detection and/or diagnosis of SARS-CoV-2 by FDA under an Emergency Use Authorization (EUA). This EUA will remain  in effect (meaning this test can be used) for the duration of the COVID-19 declaration under Section 564(b)(1) of the Act, 21 U.S.C.section 360bbb-3(b)(1), unless the authorization is terminated  or revoked sooner.       Influenza A by PCR  NEGATIVE NEGATIVE   Influenza B by PCR NEGATIVE NEGATIVE    Comment: (NOTE) The Xpert Xpress SARS-CoV-2/FLU/RSV plus assay is intended as an aid in the diagnosis of influenza from Nasopharyngeal swab specimens and should not be used as a sole basis for treatment. Nasal washings and aspirates are unacceptable for Xpert Xpress SARS-CoV-2/FLU/RSV testing.  Fact Sheet for Patients: EntrepreneurPulse.com.au  Fact Sheet for Healthcare Providers: IncredibleEmployment.be  This test is not yet approved or cleared by the [place] FDA and has been authorized for detection and/or diagnosis of SARS-CoV-2 by FDA under an Emergency Use Authorization (EUA). This EUA will remain in effect (meaning this test can be used)  for the duration of the COVID-19 declaration under Section 564(b)(1) of the Act, 21 U.S.C. section 360bbb-3(b)(1), unless the authorization is terminated or revoked.  Performed at [facility], [place] [address], [place], [place] [address]   Basic metabolic panel     Status: Abnormal   Collection Time: 10/31/21  7:34 PM  Result Value Ref Range   Sodium 138 135 - 145 mmol/L   Potassium 2.0 (LL) 3.5 - 5.1 mmol/L    Comment: CRITICAL RESULT CALLED TO, READ BACK BY AND VERIFIED WITH: COURTNEY COUTURE RN 10/31/21 2124 M KOROLESKI    Chloride 96 (L) 98 - 111 mmol/L   CO2 29 22 - 32 mmol/L   Glucose, Bld 150 (H) 70 - 99 mg/dL    Comment: Glucose reference range applies only to samples taken after fasting for at least 8 hours.   BUN 19 8 - 23 mg/dL   Creatinine, Ser 2.00 (H) 0.44 - 1.00 mg/dL   Calcium 10.2 8.9 - 10.3 mg/dL   GFR, Estimated 26 (L) >60 mL/min    Comment: (NOTE) Calculated using the CKD-EPI Creatinine Equation (2021)    Anion gap 13 5 - 15    Comment: Performed at [facility] [address] [place], [place] [address]  CBC     Status: Abnormal   Collection Time: 10/31/21  7:34 PM  Result Value Ref Range   WBC 10.1 4.0 - 10.5 K/uL   RBC 5.33 (H) 3.87 - 5.11 MIL/uL   Hemoglobin 13.9 12.0 - 15.0 g/dL   HCT 41.0 36.0 - 46.0 %   MCV 76.9 (L) 80.0 - 100.0 fL   MCH 26.1 26.0 - 34.0 pg   MCHC 33.9 30.0 - 36.0 g/dL   RDW 13.6 11.5 - 15.5 %   Platelets 235 150 - 400 K/uL   nRBC 0.0 0.0 - 0.2 %    Comment: Performed at [facility], [place] [address] [place], [place] [address]  Troponin I (High Sensitivity)     Status: None   Collection Time: 10/31/21  7:34 PM  Result Value Ref Range   Troponin I (High Sensitivity) <2 <18 ng/L    Comment: (NOTE) Elevated high sensitivity troponin I (hsTnI) values and significant  changes across serial measurements may suggest ACS but many other  chronic and acute conditions are known to elevate hsTnI results.  Refer to  the Links section for chest pain algorithms and additional  guidance. Performed at [facility], [place] [address], [place], [place] [address]   Magnesium     Status: None   Collection Time: 10/31/21  7:34 PM  Result Value Ref Range   Magnesium 2.1 1.7 - 2.4 mg/dL    Comment: Performed at [facility], [place] [address] [place], [place] [address]  Troponin  I (High Sensitivity)     Status: None   Collection Time: 10/31/21 10:09 PM  Result Value Ref Range   Troponin I (High Sensitivity) 2 <18 ng/L    Comment: (NOTE) Elevated high sensitivity troponin I (hsTnI) values and significant  changes across serial measurements may suggest ACS but many other  chronic and acute conditions are known to elevate hsTnI results.  Refer to the "Links" section for chest pain algorithms and additional  guidance. Performed at [facility], [place] [address] [place], [place] [address]   TSH     Status: None   Collection Time: 11/01/21  1:42 AM  Result Value Ref Range   TSH 2.260 0.350 - 4.500 uIU/mL    Comment: Performed by a 3rd Generation assay with a functional sensitivity of <=0.01 uIU/mL. Performed at [facility], [place] [address], [place], [place] [address]   Glucose, capillary     Status: Abnormal   Collection Time: 11/01/21  4:19 AM  Result Value Ref Range   Glucose-Capillary 117 (H) 70 - 99 mg/dL    Comment: Glucose reference range applies only to samples taken after fasting for at least 8 hours.  Basic metabolic panel     Status: Abnormal   Collection Time: 11/01/21  4:59 AM  Result Value Ref Range   Sodium 138 135 - 145 mmol/L   Potassium 2.2 (LL) 3.5 - 5.1 mmol/L    Comment: CRITICAL RESULT CALLED TO, READ BACK BY AND VERIFIED WITH: Marcelyn Ditty RN 11/01/21 0544 M KOROLESKI    Chloride 99 98 - 111 mmol/L   CO2 28 22 - 32 mmol/L   Glucose, Bld 143 (H) 70 - 99 mg/dL    Comment: Glucose reference range applies only to samples taken after fasting for at least 8 hours.    BUN 15 8 - 23 mg/dL   Creatinine, Ser 1.79 (H) 0.44 - 1.00 mg/dL   Calcium 9.7 8.9 - 10.3 mg/dL   GFR, Estimated 30 (L) >60 mL/min    Comment: (NOTE) Calculated using the CKD-EPI Creatinine Equation (2021)    Anion gap 11 5 - 15    Comment: Performed at [facility] [address], [place], [place] [address]  CBC     Status: Abnormal   Collection Time: 11/01/21  4:59 AM  Result Value Ref Range   WBC 8.0 4.0 - 10.5 K/uL   RBC 5.12 (H) 3.87 - 5.11 MIL/uL   Hemoglobin 13.4 12.0 - 15.0 g/dL   HCT 39.0 36.0 - 46.0 %   MCV 76.2 (L) 80.0 - 100.0 fL   MCH 26.2 26.0 - 34.0 pg   MCHC 34.4 30.0 - 36.0 g/dL   RDW 13.5 11.5 - 15.5 %   Platelets 190 150 - 400 K/uL   nRBC 0.0 0.0 - 0.2 %    Comment: Performed at [facility], [place] [address], [place], [place] [address]  Hemoglobin A1c     Status: Abnormal   Collection Time: 11/01/21  4:59 AM  Result Value Ref Range   Hgb A1c MFr Bld 7.2 (H) 4.8 - 5.6 %    Comment: (NOTE) Pre diabetes:          5.7%-6.4%  Diabetes:              >6.4%  Glycemic control for   <7.0% adults with diabetes    Mean Plasma Glucose 159.94 mg/dL    Comment: Performed at [facility], [address] [place], [place] [address]  Glucose, capillary     Status: Abnormal   Collection Time: 11/01/21  7:28 AM  Result Value Ref Range   Glucose-Capillary 142 (H) 70 - 99 mg/dL    Comment: Glucose reference range applies only to samples taken after fasting for at least 8 hours.  Glucose, capillary     Status: Abnormal   Collection Time: 11/01/21 11:36 AM  Result Value Ref Range   Glucose-Capillary 127 (H) 70 - 99 mg/dL    Comment: Glucose reference range applies only to samples taken after fasting for at least 8 hours.  Basic metabolic panel     Status: Abnormal   Collection Time: 11/01/21 12:30 PM  Result Value Ref Range   Sodium 138 135 - 145 mmol/L   Potassium 2.8 (L) 3.5 - 5.1 mmol/L   Chloride 102 98 - 111 mmol/L   CO2 28 22 - 32 mmol/L    Glucose, Bld 135 (H) 70 - 99 mg/dL    Comment: Glucose reference range applies only to samples taken after fasting for at least 8 hours.   BUN 14 8 - 23 mg/dL   Creatinine, Ser 1.66 (H) 0.44 - 1.00 mg/dL   Calcium 9.5 8.9 - 10.3 mg/dL   GFR, Estimated 33 (L) >60 mL/min    Comment: (NOTE) Calculated using the CKD-EPI Creatinine Equation (2021)    Anion gap 8 5 - 15    Comment: Performed at [facility] [address] [place], [place] [address]  Glucose, capillary     Status: Abnormal   Collection Time: 11/01/21  4:24 PM  Result Value Ref Range   Glucose-Capillary 144 (H) 70 - 99 mg/dL    Comment: Glucose reference range applies only to samples taken after fasting for at least 8 hours.  Renal function panel     Status: Abnormal   Collection Time: 11/01/21  8:21 PM  Result Value Ref Range   Sodium 137 135 - 145 mmol/L   Potassium 3.0 (L) 3.5 - 5.1 mmol/L   Chloride 103 98 - 111 mmol/L   CO2 25 22 - 32 mmol/L   Glucose, Bld 155 (H) 70 - 99 mg/dL    Comment: Glucose reference range applies only to samples taken after fasting for at least 8 hours.   BUN 13 8 - 23 mg/dL   Creatinine, Ser 1.53 (H) 0.44 - 1.00 mg/dL   Calcium 9.2 8.9 - 10.3 mg/dL   Phosphorus 1.4 (L) 2.5 - 4.6 mg/dL   Albumin 3.0 (L) 3.5 - 5.0 g/dL   GFR, Estimated 36 (L) >60 mL/min    Comment: (NOTE) Calculated using the CKD-EPI Creatinine Equation (2021)    Anion gap 9 5 - 15    Comment: Performed at [facility] [address] [place], [place] [address]  Glucose, capillary     Status: Abnormal   Collection Time: 11/01/21  9:12 PM  Result Value Ref Range   Glucose-Capillary 132 (H) 70 - 99 mg/dL    Comment: Glucose reference range applies only to samples taken after fasting for at least 8 hours.  Basic metabolic panel     Status: Abnormal   Collection Time: 11/02/21  2:54 AM  Result Value Ref Range   Sodium 138 135 - 145 mmol/L   Potassium 2.9 (L) 3.5 - 5.1 mmol/L   Chloride 107 98 - 111 mmol/L    CO2 24 22 - 32 mmol/L   Glucose, Bld 102 (H)  70 - 99 mg/dL    Comment: Glucose reference range applies only to samples taken after fasting for at least 8 hours.   BUN 11 8 - 23 mg/dL   Creatinine, Ser 1.55 (H) 0.44 - 1.00 mg/dL   Calcium 9.5 8.9 - 10.3 mg/dL   GFR, Estimated 36 (L) >60 mL/min    Comment: (NOTE) Calculated using the CKD-EPI Creatinine Equation (2021)    Anion gap 7 5 - 15    Comment: Performed at [facility] [address], [place], [place] [address]  Magnesium     Status: None   Collection Time: 11/02/21  6:07 AM  Result Value Ref Range   Magnesium 2.0 1.7 - 2.4 mg/dL    Comment: Performed at [facility] [address], [place], [place] [address]  Glucose, capillary     Status: None   Collection Time: 11/02/21  7:58 AM  Result Value Ref Range   Glucose-Capillary 87 70 - 99 mg/dL    Comment: Glucose reference range applies only to samples taken after fasting for at least 8 hours.  Glucose, capillary     Status: Abnormal   Collection Time: 11/02/21 12:10 PM  Result Value Ref Range   Glucose-Capillary 64 (L) 70 - 99 mg/dL    Comment: Glucose reference range applies only to samples taken after fasting for at least 8 hours.  Glucose, capillary     Status: Abnormal   Collection Time: 11/02/21  1:51 PM  Result Value Ref Range   Glucose-Capillary 170 (H) 70 - 99 mg/dL    Comment: Glucose reference range applies only to samples taken after fasting for at least 8 hours.  Basic metabolic panel     Status: Abnormal   Collection Time: 11/02/21  2:14 PM  Result Value Ref Range   Sodium 137 135 - 145 mmol/L   Potassium 3.6 3.5 - 5.1 mmol/L   Chloride 103 98 - 111 mmol/L   CO2 25 22 - 32 mmol/L   Glucose, Bld 152 (H) 70 - 99 mg/dL    Comment: Glucose reference range applies only to samples taken after fasting for at least 8 hours.   BUN 11 8 - 23 mg/dL   Creatinine, Ser 1.38 (H) 0.44 - 1.00 mg/dL   Calcium 9.8 8.9 - 10.3 mg/dL   GFR, Estimated 41 (L)  >60 mL/min    Comment: (NOTE) Calculated using the CKD-EPI Creatinine Equation (2021)    Anion gap 9 5 - 15    Comment: Performed at [facility] [address] [place], [place] [address]  Glucose, capillary     Status: Abnormal   Collection Time: 11/02/21  4:54 PM  Result Value Ref Range   Glucose-Capillary 117 (H) 70 - 99 mg/dL    Comment: Glucose reference range applies only to samples taken after fasting for at least 8 hours.  Glucose, capillary     Status: Abnormal   Collection Time: 11/02/21 10:04 PM  Result Value Ref Range   Glucose-Capillary 166 (H) 70 - 99 mg/dL    Comment: Glucose reference range applies only to samples taken after fasting for at least 8 hours.  Basic metabolic panel     Status: Abnormal   Collection Time: 11/03/21  2:54 AM  Result Value Ref Range   Sodium 137 135 - 145 mmol/L   Potassium 4.3 3.5 - 5.1 mmol/L   Chloride 109 98 - 111 mmol/L   CO2 22 22 - 32 mmol/L  Glucose, Bld 109 (H) 70 - 99 mg/dL    Comment: Glucose reference range applies only to samples taken after fasting for at least 8 hours.   BUN 12 8 - 23 mg/dL   Creatinine, Ser 1.34 (H) 0.44 - 1.00 mg/dL   Calcium 9.0 8.9 - 10.3 mg/dL   GFR, Estimated 42 (L) >60 mL/min    Comment: (NOTE) Calculated using the CKD-EPI Creatinine Equation (2021)    Anion gap 6 5 - 15    Comment: Performed at [facility] [address], [place], [place] [address]  Glucose, capillary     Status: Abnormal   Collection Time: 11/03/21  8:02 AM  Result Value Ref Range   Glucose-Capillary 113 (H) 70 - 99 mg/dL    Comment: Glucose reference range applies only to samples taken after fasting for at least 8 hours.  Glucose, capillary     Status: Abnormal   Collection Time: 11/03/21 11:19 AM  Result Value Ref Range   Glucose-Capillary 116 (H) 70 - 99 mg/dL    Comment: Glucose reference range applies only to samples taken after fasting for at least 8 hours.  Glucose, capillary     Status: Abnormal    Collection Time: 11/03/21  4:08 PM  Result Value Ref Range   Glucose-Capillary 107 (H) 70 - 99 mg/dL    Comment: Glucose reference range applies only to samples taken after fasting for at least 8 hours.  Glucose, capillary     Status: Abnormal   Collection Time: 11/03/21 10:13 PM  Result Value Ref Range   Glucose-Capillary 186 (H) 70 - 99 mg/dL    Comment: Glucose reference range applies only to samples taken after fasting for at least 8 hours.  Basic metabolic panel     Status: Abnormal   Collection Time: 11/04/21  2:18 AM  Result Value Ref Range   Sodium 137 135 - 145 mmol/L   Potassium 4.6 3.5 - 5.1 mmol/L   Chloride 109 98 - 111 mmol/L   CO2 23 22 - 32 mmol/L   Glucose, Bld 125 (H) 70 - 99 mg/dL    Comment: Glucose reference range applies only to samples taken after fasting for at least 8 hours.   BUN 12 8 - 23 mg/dL   Creatinine, Ser 1.24 (H) 0.44 - 1.00 mg/dL   Calcium 9.0 8.9 - 10.3 mg/dL   GFR, Estimated 47 (L) >60 mL/min    Comment: (NOTE) Calculated using the CKD-EPI Creatinine Equation (2021)    Anion gap 5 5 - 15    Comment: Performed at [facility] [address], [place], [place] [address]  Glucose, capillary     Status: Abnormal   Collection Time: 11/04/21  8:01 AM  Result Value Ref Range   Glucose-Capillary 133 (H) 70 - 99 mg/dL    Comment: Glucose reference range applies only to samples taken after fasting for at least 8 hours.  Lipid panel     Status: Abnormal   Collection Time: 11/08/21 10:11 AM  Result Value Ref Range   Cholesterol, Total 137 100 - 199 mg/dL   Triglycerides 111 0 - 149 mg/dL   HDL 39 (L) >39 mg/dL   VLDL Cholesterol Cal 20 5 - 40 mg/dL   LDL Chol Calc (NIH) 78 0 - 99 mg/dL   Chol/HDL Ratio 3.5 0.0 - 4.4 ratio    Comment:  T. Chol/HDL Ratio                                             Men  Women                               1/2 Avg.Risk  3.4    3.3                                   Avg.Risk  5.0     4.4                                2X Avg.Risk  9.6    7.1                                3X Avg.Risk 23.4   11.0   ALT     Status: None   Collection Time: 11/08/21 10:11 AM  Result Value Ref Range   ALT 14 0 - 32 IU/L  Basic Metabolic Panel     Status: Abnormal   Collection Time: 11/12/21  2:55 PM  Result Value Ref Range   Glucose 101 (H) 70 - 99 mg/dL   BUN 13 8 - 27 mg/dL   Creatinine, Ser 1.12 (H) 0.57 - 1.00 mg/dL   eGFR 53 (L) >59 mL/min/1.73   BUN/Creatinine Ratio 12 12 - 28   Sodium 144 134 - 144 mmol/L   Potassium 5.3 (H) 3.5 - 5.2 mmol/L   Chloride 106 96 - 106 mmol/L   CO2 19 (L) 20 - 29 mmol/L   Calcium 10.8 (H) 8.7 - 10.3 mg/dL  Basic Metabolic Panel     Status: Abnormal   Collection Time: 11/22/21  2:00 PM  Result Value Ref Range   Glucose 137 (H) 70 - 99 mg/dL   BUN 14 8 - 27 mg/dL   Creatinine, Ser 1.35 (H) 0.57 - 1.00 mg/dL   eGFR 42 (L) >59 mL/min/1.73   BUN/Creatinine Ratio 10 (L) 12 - 28   Sodium 143 134 - 144 mmol/L   Potassium 3.9 3.5 - 5.2 mmol/L   Chloride 106 96 - 106 mmol/L   CO2 23 20 - 29 mmol/L   Calcium 9.8 8.7 - 10.3 mg/dL  Potassium     Status: None   Collection Time: 11/22/21  2:07 PM  Result Value Ref Range   Potassium 4.0 3.5 - 5.2 mmol/L    Objective: General: Patient is awake, alert, and oriented x 3 and in no acute distress.  Integument: Skin is warm, dry and supple bilateral. Nails are tender, long, thickened and dystrophic with subungual debris, consistent with onychomycosis, 1-5 bilateral.  No acute signs of ingrowing at the left hallux.  No signs of infection. No open lesions or preulcerative lesions present bilateral. Remaining integument unremarkable.  Vasculature:  Dorsalis Pedis pulse 1/4 bilateral. Posterior Tibial pulse 1/4 bilateral.  Capillary fill time <3 sec 1-5 bilateral.  Scant positive hair growth to the level of the digits. Temperature gradient within normal limits. No varicosities present bilateral. No edema  present bilateral.   Neurology: The patient has intact sensation  measured with a 5.07/10g Semmes Weinstein Monofilament at all pedal sites bilateral . Vibratory sensation diminished bilateral with tuning fork. No Babinski sign present bilateral.   Musculoskeletal: No symptomatic pedal deformities noted bilateral. Muscular strength 5/5 in all lower extremity muscular groups bilateral without pain on range of motion . No tenderness with calf compression bilateral.  Assessment and Plan: Problem List Items Addressed This Visit   None Visit Diagnoses     Pain due to onychomycosis of toenails of both feet    -  Primary   Diabetes mellitus without complication ([place])       Current use of long term anticoagulation           -Examined patient. -Re-Discussed and educated patient on diabetic foot care, especially with  regards to the vascular, neurological and musculoskeletal systems.  -Mechanically debrided all nails 1-5 bilateral using sterile nail nipper and filed with dremel without incident  -Answered all patient questions -Patient to return  in 3 months for at risk foot care -Patient advised to call the office if any problems or questions arise in the meantime.  Landis Martins, DPM

## 2021-12-20 ENCOUNTER — Other Ambulatory Visit: Payer: Self-pay

## 2021-12-20 ENCOUNTER — Telehealth: Payer: Self-pay | Admitting: Pharmacist

## 2021-12-20 ENCOUNTER — Ambulatory Visit: Payer: Medicare HMO | Admitting: Pharmacist

## 2021-12-20 DIAGNOSIS — E7800 Pure hypercholesterolemia, unspecified: Secondary | ICD-10-CM

## 2021-12-20 MED ORDER — NEXLIZET 180-10 MG PO TABS: 1.0000 | ORAL_TABLET | Freq: Every day | ORAL | 11 refills | Status: DC

## 2021-12-20 NOTE — Progress Notes (Signed)
Patient ID: WALKER PADDACK                 DOB: 1951-06-15                    MRN: [id number]     HPI: Meredith [name] is a 71 y.o. female patient referred to lipid clinic by Dr. Radford Pax. 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 LDL-C 11/08/21 was 78. Patient reported compliance to medications, therefore was referred to PharmD clinic.  Patient presents to lipid clinic. She walks with a cane. States she works from 9AM-9:30PM at [employer] 4 days a week. She has been working there 35 years. She is compliance with her current lipid therapy and has no complaints. She walks during her lunch break at work and rides what sounds like a desk cycle to help the arthritis in her knees. She has [employer] advantage but reports she doesn't pay anything for her medications.  Current Medications: rosuvastatin 69m daily, ezetimibe 174mdaily Intolerances:  Risk Factors: DM, CAD LDL goal: <55  Diet: breakfast: sometimes doesn't eat breakfast, oatmeal w/ raisins or cereal Lunch: salad lettuce tomato, cucumber- italian dressing, bowl of pinto beans and cornbread, fried chicken or baked chicken with rice, canelope, honeydew Dinner: peanut butter sandwich (white bread-doesn't like brown bread) Drink: water, stopped drinking soda, 1/2 and 1/2 tea Got rid of all the candy and cake in the house  Exercise: walks around the building on her work break, walks about a mile if weather is good. Rides a desk cycle 45 min per day  Family History: The patient's family history includes Clotting disorder in an other family member; Diabetes in her mother, sister, sister, sister, and sister; Heart disease in her father and mother; Kidney disease in her mother; Lung cancer in her father; Stomach cancer in her sister. There is no history of Colon cancer  Social History:  Social History   Socioeconomic History   Marital  status: Divorced    Spouse name: Not on file   Number of children: 4   Years of education: 11   Highest education level: Not on file  Occupational History   Occupation: COOK    Employer: [employer]    Comment: 3 days a week  Tobacco Use   Smoking status: Former    Packs/day: 1.00    Years: 5.00    Pack years: 5.00    Types: Cigarettes    Quit date: 12/09/1978    Years since quitting: 43.0   Smokeless tobacco: Never  Vaping Use   Vaping Use: Never used  Substance and Sexual Activity   Alcohol use: No   Drug use: No   Sexual activity: Not Currently    Birth control/protection: Surgical  Other Topics Concern   Not on file  Social History Narrative   Patient lives alone in [place]  Patient still works 3 days a week at [employer] keep herself busy.   Patient enjoys walking everyday and speaking with various neighbors.    Patient enjoys watching tv, puzzle books, spending time with her family, and reading her prayers.  Social Determinants of Health   Financial Resource Strain: Not on file  Food Insecurity: Not on file  Transportation Needs: Not on file  Physical Activity: Not on file  Stress: Not on file  Social Connections: Not on file  Intimate Partner Violence: Not on file     Labs: 11/08/21 TC 137 TG 111 HDL 39 LDL-C 78 (rosuvastatin 62m daily, ezetimibe 136mdaily)  Past Medical History:  Diagnosis Date   ANEMIA, IRON DEFICIENCY, UNSPEC. 02/05/2007   Qualifier: Diagnosis of  By: DuDamita DunningsD, GrPhillip Heal   ASTHMA, INTERMITTENT 09/07/2010   Qualifier: Diagnosis of  By: RiJess BartersD, ErCindee Salt   CAD (coronary artery disease), native coronary artery 02/16/2015   Cath with 20% LCx and RCA   Colon polyps    CTS (carpal tunnel syndrome) 06/06/2016   Diabetes mellitus    Diverticulosis 05/17/12   DJD (degenerative joint disease) of lumbar spine    Enteritis    Gastric ulcer  04/2000   Hyperlipidemia    Hypertension    Irregular heart beat    Obesity    Osteoarthritis    Persistent atrial fibrillation (HCC)    CHADS2VASC score is 5 and on Xarelto   Pruritus 02/21/2021   Rash 02/15/2021   Renal cyst, left 05/17/12   Ventral hernia     Current Outpatient Medications on File Prior to Visit  Medication Sig Dispense Refill   Accu-Chek Softclix Lancets lancets Use as instructed to test blood glucose once daily. ICD-10 code: E11.9 100 each 12   acetaminophen (ACETAMINOPHEN 8 HOUR) 650 MG CR tablet Take 1 tablet (650 mg total) by mouth every 8 (eight) hours. (Patient taking differently: Take 650 mg by mouth every 8 (eight) hours as needed for pain.) 30 tablet 0   amLODipine (NORVASC) 10 MG tablet Take 1 tablet (10 mg total) by mouth daily. 90 tablet 3   Blood Glucose Monitoring Suppl (ACCU-CHEK AVIVA PLUS) w/Device KIT 1 kit by Does not apply route as directed. ICD-10 code: E11.9 1 kit 0   canagliflozin (INVOKANA) 100 MG TABS tablet Take 1 tablet (100 mg total) by mouth daily. Take before first meal of day for four weeks. Return to doctor within 3rd or 4th week. (Patient taking differently: Take 100 mg by mouth daily.) 90 tablet 3   diclofenac sodium (VOLTAREN) 1 % GEL Apply 2 g topically 4 (four) times daily. (Patient taking differently: Apply 2 g topically 4 (four) times daily as needed (knee pain).) 100 g 0   ezetimibe (ZETIA) 10 MG tablet Take 1 tablet (10 mg total) by mouth daily. 90 tablet 3   fexofenadine (ALLEGRA) 180 MG tablet Take 1 tablet (180 mg total) by mouth daily. (Patient taking differently: Take 180 mg by mouth daily as needed for allergies.) 30 tablet 11   glucose blood (ACCU-CHEK AVIVA PLUS) test strip USE AS INSTRUCTED TEST BLOOD GLUCOSE ONCE DAILY. ICD-10 CODE: E11.9 100 each 12   Lido-Capsaicin-Men-Methyl Sal 0.5-0.035-5-20 % PTCH Apply 1 each topically daily as needed. (Patient taking differently: Apply 1 each topically daily as needed (pain).) 60  patch 3   Lidocaine (HM LIDOCAINE PATCH) 4 % PTCH Apply 1 each topically daily as needed. (Patient taking differently: Apply 1 each topically daily as needed (pain).) 15 patch 12   metFORMIN (GLUCOPHAGE) 1000 MG tablet Take 1 tablet twice daily with food. (Patient taking differently: Take 1,000 mg by mouth 2 (two) times daily with a meal. Take 1 tablet twice daily with food.) 180  tablet 3   metoprolol succinate (TOPROL-XL) 50 MG 24 hr tablet Take 1 tablet (50 mg total) by mouth daily. TAKE WITH OR IMMEDIATELY FOLLOWING A MEAL. 90 tablet 3   potassium chloride (KLOR-CON) 10 MEQ tablet Take 2 tablets (20 mEq total) by mouth 2 (two) times daily. 360 tablet 2   rivaroxaban (XARELTO) 20 MG TABS tablet Take 1 tablet (20 mg total) by mouth daily with supper. 90 tablet 3   rosuvastatin (CRESTOR) 40 MG tablet Take 1 tablet (40 mg total) by mouth daily. (Patient taking differently: Take 40 mg by mouth at bedtime.) 90 tablet 3   triamcinolone ointment (KENALOG) 0.5 % Apply 1 application topically 2 (two) times daily. For moderate to severe eczema.  Do not use for more than 1 week at a time. (Patient taking differently: Apply 1 application topically 2 (two) times daily. For moderate to severe eczema.  Do not use for more than 1 week at a time.) 60 g 3   urea (GORDONS UREA) 40 % ointment Apply topically as needed. (Patient taking differently: Apply 1 application topically as needed (itching).) 30 g 0   No current facility-administered medications on file prior to visit.    Allergies  Allergen Reactions   Lisinopril Anaphylaxis, Shortness Of Breath and Swelling    Mouth and throat became swollen   Doxycycline Swelling    Possibly caused swelling of lips and hives in May 2019, timeline unclear   Flexeril [Cyclobenzaprine Hcl] Itching and Other (See Comments)    Welts also   Lipitor [Atorvastatin Calcium] Other (See Comments)    Myalgia   Metaxalone Itching and Other (See Comments)    Welts also      Assessment/Plan:  1. Hyperlipidemia - LDL is above goal of <55. We discussed PCSK9i but she refuses any injection. Attempted to explain that no needle is seen, but patient refused. We discussed Nexlizet. Patient was hesitant to add additional medications. Discussed the risk reduction she would receive and explained that it was her decision if that risk reduction was worth to her an additional medication. Patient agreeable. I will submit prior authorization and will call into pharmacy to check price. Then will review again with patient stopping zetia and starting Nexlizet if affordable.   Thank you,   Ramond Dial, Pharm.D, BCPS, CPP [facility]  [address], [place], [place] [address]  Phone: [phone]; Fax: [phone]

## 2021-12-20 NOTE — Patient Instructions (Signed)
It was nice to meet you!  Please continue taking rosuvastatin 40mg  and ezetimibe 10mg  daily.  I will submit a prior authorization for Nexlizet. I will call you once I hear back from your insurance.  Please call me at [phone] with any questions  Tips for living a healthier life     Building a Healthy and Balanced Diet Make most of your meal vegetables and fruits -  of your plate. Aim for color and variety, and remember that potatoes dont count as vegetables on the Healthy Eating Plate because of their negative impact on blood sugar.  Go for whole grains -  of your plate. Whole and intact grains--whole wheat, barley, wheat berries, quinoa, oats, brown rice, and foods made with them, such as whole wheat pasta--have a milder effect on blood sugar and insulin than white bread, white rice, and other refined grains.  Protein power -  of your plate. Fish, poultry, beans, and nuts are all healthy, versatile protein sources--they can be mixed into salads, and pair well with vegetables on a plate. Limit red meat, and avoid processed meats such as bacon and sausage.  Healthy plant oils - in moderation. Choose healthy vegetable oils like olive, canola, soy, corn, sunflower, peanut, and others, and avoid partially hydrogenated oils, which contain unhealthy trans fats. Remember that low-fat does not mean healthy.  Drink water, coffee, or tea. Skip sugary drinks, limit milk and dairy products to one to two servings per day, and limit juice to a small glass per day.  Stay active. The red figure running across the Healthy Eating Plates placemat is a reminder that staying active is also important in weight control.  The main message of the Healthy Eating Plate is to focus on diet quality:  The type of carbohydrate in the diet is more important than the amount of carbohydrate in the diet, because some sources of carbohydrate--like vegetables (other than potatoes), fruits, whole grains, and  beans--are healthier than others. The Healthy Eating Plate also advises consumers to avoid sugary beverages, a major source of calories--usually with little nutritional value--in the American diet. The Healthy Eating Plate encourages consumers to use healthy oils, and it does not set a maximum on the percentage of calories people should get each day from healthy sources of fat. In this way, the Healthy Eating Plate recommends the opposite of the low-fat message promoted for decades by the USDA.   SUGAR  Sugar is a huge problem in the modern day diet. Sugar is a big contributor to heart disease, diabetes, high triglyceride levels, fatty liver disease and obesity. Sugar is hidden in almost all packaged foods/beverages. Added sugar is extra sugar that is added beyond what is naturally found and has no nutritional benefit for your body. The American Heart Association recommends limiting added sugars to no more than 25g for women and 36 grams for men per day. There are many names for sugar including maltose, sucrose (names ending in "ose"), high fructose corn syrup, molasses, cane sugar, corn sweetener, raw sugar, syrup, honey or fruit juice concentrate.   One of the best ways to limit your added sugars is to stop drinking sweetened beverages such as soda, sweet tea, and fruit juice.  There is 65g of added sugars in one 20oz bottle of Coke! That is equal to 7.5 donuts.   Pay attention and read all nutrition facts labels. Below is an examples of a nutrition facts label. The #1 is showing you the total sugars where the #  2 is showing you the added sugars. This one serving has almost the max amount of added sugars per day!     20 oz Soda 65g Sugar = 7.5 Glazed Donuts  16oz Energy  Drink 54g Sugar = 6.5 Glazed Donuts  Large Sweet  Tea 38g Sugar = 4 Glazed Donuts  20oz Sports  Drink 34g Sugar = 3.5 Glazed Donuts  8oz Chocolate  Milk 24g Sugar =2.5 Glazed Donuts  8oz Orange  Juice 21g Sugar = 2 Glazed Donuts  1 Juice Box 14g Sugar = 1.5 Glazed Donuts  16oz Water= NO SUGAR!!  EXERCISE  Exercise is good. Weve all heard that. In an ideal world, we would all have time and resources to get plenty of it. When you are active, your heart pumps more efficiently and you will feel better.  Multiple studies show that even walking regularly has benefits that include living a longer life. The American Heart Association recommends 150 minutes per week of exercise (30 minutes per day most days of the week). You can do this in any increment you wish. Nine or more 10-minute walks count. So does an hour-long exercise class. Break the time apart into what will work in your life. Some of the best things you can do include walking briskly, jogging, cycling or swimming laps. Not everyone is ready to exercise. Sometimes we need to start with just getting active. Here are some easy ways to be more active throughout the day:  Take the stairs instead of the elevator  Go for a 10-15 minute walk during your lunch break (find a friend to make it more enjoyable)  When shopping, park at the back of the parking lot  If you take public transportation, get off one stop early and walk the extra distance  Pace around while making phone calls  Check with your doctor if you arent sure what your limitations may be. Always remember to drink plenty of water when doing any type of exercise. Dont feel like a failure if youre not getting the 90-150 minutes per week. If you started by being a couch potato, then just a 10-minute walk each day is a huge improvement. Start with little victories and work your way up.   HEALTHY EATING TIPS  When looking to improve your eating habits, whether to lose weight, lower blood pressure or just be healthier, it helps to know what a serving size is.   Grains 1 slice of bread,  bagel,  cup pasta or  rice  Vegetables 1 cup fresh or raw vegetables,  cup cooked or canned Fruits 1 piece of medium sized fruit,  cup canned,   Meats/Proteins  cup dried       1 oz meat, 1 egg,  cup cooked beans, nuts or seeds  Dairy        Fats Individual yogurt container, 1 cup (8oz)    1 teaspoon margarine/butter or vegetable  milk or milk alternative, 1 slice of cheese          oil; 1 tablespoon mayonnaise or salad dressing                  Plan ahead: make a menu of the meals for a week then create a grocery list to go with that menu. Consider meals that easily stretch into a night of leftovers, such as stews or casseroles. Or consider making two of your favorite meal and put one in the freezer for another night. Try a night  or two each week that is meatless or no cook such as salads. When you get home from the grocery store wash and prepare your vegetables and fruits. Then when you need them they are ready to go.   Tips for going to the grocery store:  Buy store or generic brands  Check the weekly ad from your store on-line or in their in-store flyer  Look at the unit price on the shelf tag to compare/contrast the costs of different items  Buy fruits/vegetables in season  Carrots, bananas and apples are low-cost, naturally healthy items  If meats or frozen vegetables are on sale, buy some extras and put in your freezer  Limit buying prepared or ready to eat items, even if they are pre-made salads or fruit snacks  Do not shop when youre hungry  Foods at eye level tend to be more expensive. Look on the high and low shelves for deals.  Consider shopping at the farmers market for fresh foods in season.  Avoid the cookie and chip aisles (these are expensive, high in calories and low in nutritional value). Shop on the outside of the grocery store.  Healthy food preparations:  If you cant get lean hamburger, be sure to drain the fat when cooking  Steam, saut (in olive oil), grill or bake foods   Experiment with different seasonings to avoid adding salt to your foods. Kosher salt, sea salt and Himalayan salt are all still salt and should be avoided. Try seasoning food with onion, garlic, thyme, rosemary, basil ect. Onion powder or garlic powder is ok. Avoid if it says salt (ie garlic salt).

## 2021-12-20 NOTE — Telephone Encounter (Signed)
Called patient's pharmacy. Cost of Nexlizet is $10.35/ month.Patient is ok with the cost. She was reminded to stop zetia when she starts Nexlizet. Labs on 4/6.

## 2022-01-04 ENCOUNTER — Other Ambulatory Visit: Payer: Self-pay | Admitting: Family Medicine

## 2022-01-09 ENCOUNTER — Other Ambulatory Visit: Payer: Self-pay | Admitting: Family Medicine

## 2022-01-09 DIAGNOSIS — E119 Type 2 diabetes mellitus without complications: Secondary | ICD-10-CM

## 2022-01-20 ENCOUNTER — Other Ambulatory Visit: Payer: Self-pay | Admitting: Family Medicine

## 2022-01-20 DIAGNOSIS — E119 Type 2 diabetes mellitus without complications: Secondary | ICD-10-CM

## 2022-01-21 ENCOUNTER — Other Ambulatory Visit: Payer: Self-pay | Admitting: Family Medicine

## 2022-01-21 NOTE — Progress Notes (Signed)
Error.  ?Meredith Quattrone, MD ? ?

## 2022-02-01 ENCOUNTER — Telehealth: Payer: Self-pay

## 2022-02-01 NOTE — Telephone Encounter (Signed)
Patient calls nurse line reporting an itchy rash on various parts of her body. Patient reports symptoms have been present for sometime. Patient reports she has changed detergents and soaps without relief.   Patient scheduled for Monday for evaluation. Patient advised she can use Benadryl and hydrocortisone cream in the meantime.

## 2022-02-04 ENCOUNTER — Other Ambulatory Visit: Payer: Self-pay

## 2022-02-04 ENCOUNTER — Ambulatory Visit (INDEPENDENT_AMBULATORY_CARE_PROVIDER_SITE_OTHER): Payer: Medicare HMO | Admitting: Family Medicine

## 2022-02-04 VITALS — BP 155/72 | HR 69 | Ht 62.0 in | Wt 210.6 lb

## 2022-02-04 DIAGNOSIS — L299 Pruritus, unspecified: Secondary | ICD-10-CM | POA: Diagnosis not present

## 2022-02-04 DIAGNOSIS — L500 Allergic urticaria: Secondary | ICD-10-CM

## 2022-02-04 DIAGNOSIS — R21 Rash and other nonspecific skin eruption: Secondary | ICD-10-CM | POA: Diagnosis not present

## 2022-02-04 DIAGNOSIS — Z0000 Encounter for general adult medical examination without abnormal findings: Secondary | ICD-10-CM

## 2022-02-04 MED ORDER — TRIAMCINOLONE ACETONIDE 0.1 % EX CREA: 1.0000 "application " | TOPICAL_CREAM | Freq: Two times a day (BID) | CUTANEOUS | 0 refills | Status: DC

## 2022-02-04 MED ORDER — FEXOFENADINE HCL 180 MG PO TABS: 180.0000 mg | ORAL_TABLET | Freq: Every day | ORAL | 3 refills | Status: DC | PRN

## 2022-02-04 NOTE — Assessment & Plan Note (Addendum)
Recommend patient bring in copy of FIT test results for completion. Declines colonoscopy. Upcoming ophthalmology visit.

## 2022-02-04 NOTE — Assessment & Plan Note (Signed)
Refill allegra 180 mg, triamcinolone 0.2% cream BID x 2 weeks. Unclear cause, though most likely culprit is recent change in personal care products. Patient was using dove soap and not-name brand detergent previously. Recommend that patient return to those prior personal care products and stop using new "sensitive" products. If no improvement with that change, must consider medication, but less likely given timeline. Consider referral to allergist if no improvement or worsening.

## 2022-02-04 NOTE — Progress Notes (Signed)
° ° °  SUBJECTIVE:   CHIEF COMPLAINT / HPI:   Itchy rash: patient reports that she has had several "bumps" that are red and itchy and have appeared on her trunk, arms and face in the last week. She reports that 2 weeks ago she changed her soap and detergent to "sensitive" branded products. She cannot think of any foods she has tried that are new, does not have pets. She was started on bempedoic acid in mid January for HLD.   HC maintenance: patient is due for colonoscopy. She reprots that she does not wish to do colonoscopy, but completed FIT screening via health insurance. Discussed the nature of FIT screening and that if at high risk, patient would need colonoscopy. She is ok with this, she cannot recall would the result of the test was, there is no record of the screen in her chart.  DM2: patient has upcoming ophthalmology appt  PERTINENT  PMH / PSH: DM2  OBJECTIVE:   BP (!) 155/72    Pulse 69    Ht 5\' 2"  (1.575 m)    Wt 210 lb 9.6 oz (95.5 kg)    SpO2 100%    BMI 38.52 kg/m   Nursing note and vitals reviewed GEN: age-appropriate, AAW, resting comfortably in chair, NAD, class II obesity HEENT: NCAT. PERRLA. Sclera without injection or icterus. MMM.  Cardiac: Regular rate and rhythm. Normal S1/S2. No murmurs, rubs, or gallops appreciated. 2+ radial pulses. Lungs: Clear bilaterally to ascultation. No increased WOB, no accessory muscle usage. No w/r/r. Skin: erythematous wheals across trunk and under L eye Neuro: AOx3  Ext: no edema Psych: Pleasant and appropriate    ASSESSMENT/PLAN:   Allergic urticaria Refill allegra 180 mg, triamcinolone 0.2% cream BID x 2 weeks. Unclear cause, though most likely culprit is recent change in personal care products. Patient was using dove soap and not-name brand detergent previously. Recommend that patient return to those prior personal care products and stop using new "sensitive" products. If no improvement with that change, must consider medication,  but less likely given timeline. Consider referral to allergist if no improvement or worsening.  Healthcare maintenance Recommend patient bring in copy of FIT test results for completion. Declines colonoscopy. Upcoming ophthalmology visit.     , MD [facility]

## 2022-02-04 NOTE — Patient Instructions (Signed)
It was a pleasure to see you today!  For itching: please take allegra 180mg  once a day. Also you may use triamcinolone 0.1% cr on the itchy spots twice a day for two weeks and stop. Do not use the cream near your eyes/nose/mouth. Please stop the new soap and detergent. Go back to the soap/detergent that you were using before. Keep a list of what products you are using and note when/if a new rash pops up. This will help determine what is triggering your allergy Follow up if no improvement Please get a copy of the stool testing you did with your insurance and give it to your primary care doctor (just drop it at the front desk)    Be Well,  Dr. [place]

## 2022-02-06 ENCOUNTER — Telehealth: Payer: Self-pay

## 2022-02-06 ENCOUNTER — Other Ambulatory Visit: Payer: Self-pay | Admitting: Family Medicine

## 2022-02-06 DIAGNOSIS — R21 Rash and other nonspecific skin eruption: Secondary | ICD-10-CM

## 2022-02-06 NOTE — Telephone Encounter (Signed)
Patient calls nurse line reporting she was at work today and began to feel dizzy. Patient reports she had to sit down and noticed a "fast" heart beat. Patient reports she called EMS.  ? ?EMS reports a BP of 181/93. Patient unsure of any other vials. EMS suggested she go to the hospital, however patient declined. Patient reports she is back at work and feels fine now.  ? ?Patient scheduled for tomorrow for evaluation.  ?Strict ED precautions given to patient in the meantime for chest pains, vision changes, SOB or headache.  ? ? ?

## 2022-02-07 ENCOUNTER — Encounter: Payer: Self-pay | Admitting: Student

## 2022-02-07 ENCOUNTER — Ambulatory Visit (INDEPENDENT_AMBULATORY_CARE_PROVIDER_SITE_OTHER): Payer: Medicare PPO | Admitting: Student

## 2022-02-07 ENCOUNTER — Other Ambulatory Visit: Payer: Self-pay

## 2022-02-07 VITALS — BP 125/67 | HR 75 | Ht 62.0 in | Wt 207.2 lb

## 2022-02-07 DIAGNOSIS — R42 Dizziness and giddiness: Secondary | ICD-10-CM | POA: Diagnosis not present

## 2022-02-07 NOTE — Assessment & Plan Note (Signed)
Differential includes: vasovagal episode, orthostasis, cardiac abnormality, CVA/TIA, electrolyte abnormality. Most likely vasovagal but could be related to hypokalemia versus restarting HCTZ as she was previously hospitalized in November 2022 for severe hypokalemia in the setting of HCTZ use with a similar episode. Not likely CVA/TIA with no residual neurological deficits and the rapid improvement of symptoms from onset. Unlikely cardiac as she was evaluated by EMS and is rate controlled today. Ordered CBC and BMP to evaluate further, will contact patient with results. Instructed the patient to discontinue HCTZ and to follow up with Dr. Jeani Hawking for medication management.  ?

## 2022-02-07 NOTE — Patient Instructions (Signed)
It was a pleasure to see you today! Thank you for choosing [facility] for your primary care. Meredith [name] was seen for an episode of dizziness.  ? ?Our plans for today were: ?Continue with potassium as instructed  ?Discontinue HCTZ (blood pressure medication) ?Ordered CBC and BMP today, will call or send letter with results.  ? ?Follow up with Dr. Larita Fife for medication management in 1-2 months.  ? ? ?Best,  ?Jaedyn Lard  ? ?

## 2022-02-07 NOTE — Progress Notes (Signed)
? ? ?  SUBJECTIVE:  ? ?CHIEF COMPLAINT / HPI:  ? ?Episode of Dizziness ?She reports new onset episode of dizziness. She describes it as feeling light headed, and lasted several minutes. Patient was working yesterday, bent over and "felt funny." Patient had to sit down, but she never had LOC. Called the ambulance, they checked her and told her to come to the PCP. Performed EKG, patient is unsure what that showed. She drinks water, takes her potassium daily and does not miss a dose. All symptoms have resolved today. Patient restarted HCTZ recently as it was ordered for her, but she does not think she is supposed to be taking it.  ? ? ?Associated symptoms: ?No hearing loss No tinnitus  ?No chest discomfort No heart palpitations  ?No heart racing No numbness or tingling of extremities  ?No nausea No vomiting  ?No speech difficulty No visual changes  ? ? ?Wt Readings from Last 3 Encounters:  ?02/07/22 207 lb 3.2 oz (94 kg)  ?02/04/22 210 lb 9.6 oz (95.5 kg)  ?11/12/21 212 lb 3.2 oz (96.3 kg)  ?  BP Readings from Last 3 Encounters:  ?02/07/22 125/67  ?02/04/22 (!) 155/72  ?11/12/21 121/68  ?   ? ?Lab Results  ?Component Value Date  ? WBC 8.0 11/01/2021  ? HGB 13.4 11/01/2021  ? HCT 39.0 11/01/2021  ? MCV 76.2 (L) 11/01/2021  ? PLT 190 11/01/2021  ? Lab Results  ?Component Value Date  ? NA 143 11/22/2021  ? K 4.0 11/22/2021  ? CO2 23 11/22/2021  ? BUN 14 11/22/2021  ? CREATININE 1.35 (H) 11/22/2021  ? CALCIUM 9.8 11/22/2021  ? GLUCOSE 137 (H) 11/22/2021  ?  ? ?--------------------------------------------------------------------------------------------------- ? ? ?PERTINENT  PMH / PSH:  ?None pertinent  ? ? ?OBJECTIVE:  ? ?BP 125/67   Pulse 75   Ht 5\' 2"  (1.575 m)   Wt 207 lb 3.2 oz (94 kg)   SpO2 97%   BMI 37.90 kg/m?   ?General: Alert and oriented in no apparent distress, nontoxic  ?Heart: Regular rate and rhythm with no murmurs appreciated ?Lungs: CTA bilaterally, no wheezing ?Abdomen: Bowel sounds present, no  abdominal pain ?Skin: Warm and dry ?Extremities: No lower extremity edema ?Neuro: CNII-XII intact with normal sensation and 5/5 strength bilaterally. No focal neurological deficits.  ? ? ?ASSESSMENT/PLAN:  ? ?Dizziness ?Differential includes: vasovagal episode, orthostasis, cardiac abnormality, CVA/TIA, electrolyte abnormality. Most likely vasovagal but could be related to hypokalemia versus restarting HCTZ as she was previously hospitalized in November 2022 for severe hypokalemia in the setting of HCTZ use with a similar episode. Not likely CVA/TIA with no residual neurological deficits and the rapid improvement of symptoms from onset. Unlikely cardiac as she was evaluated by EMS and is rate controlled today. Ordered CBC and BMP to evaluate further, will contact patient with results. Instructed the patient to discontinue HCTZ and to follow up with Dr. Jeani Hawking for medication management.  ?  ? ? ?Erskine Emery, MD ?[place]  ? ?

## 2022-02-08 ENCOUNTER — Encounter: Payer: Self-pay | Admitting: Student

## 2022-02-08 LAB — BASIC METABOLIC PANEL
BUN/Creatinine Ratio: 21 (ref 12–28)
BUN: 21 mg/dL (ref 8–27)
CO2: 26 mmol/L (ref 20–29)
Calcium: 10.5 mg/dL — ABNORMAL HIGH (ref 8.7–10.3)
Chloride: 103 mmol/L (ref 96–106)
Creatinine, Ser: 0.98 mg/dL (ref 0.57–1.00)
Glucose: 109 mg/dL — ABNORMAL HIGH (ref 70–99)
Potassium: 4 mmol/L (ref 3.5–5.2)
Sodium: 147 mmol/L — ABNORMAL HIGH (ref 134–144)
eGFR: 62 mL/min/{1.73_m2} (ref >59)

## 2022-02-08 LAB — CBC
Hematocrit: 35.7 % (ref 34.0–46.6)
Hemoglobin: 11.7 g/dL (ref 11.1–15.9)
MCH: 25.8 pg — ABNORMAL LOW (ref 26.6–33.0)
MCHC: 32.8 g/dL (ref 31.5–35.7)
MCV: 79 fL (ref 79–97)
Platelets: 227 10*3/uL (ref 150–450)
RBC: 4.54 x10E6/uL (ref 3.77–5.28)
RDW: 14.7 % (ref 11.7–15.4)
WBC: 6.9 10*3/uL (ref 3.4–10.8)

## 2022-02-28 ENCOUNTER — Other Ambulatory Visit: Payer: Self-pay

## 2022-02-28 ENCOUNTER — Telehealth: Payer: Self-pay | Admitting: Family Medicine

## 2022-02-28 ENCOUNTER — Ambulatory Visit: Payer: Medicare PPO | Admitting: Sports Medicine

## 2022-02-28 DIAGNOSIS — M79674 Pain in right toe(s): Secondary | ICD-10-CM

## 2022-02-28 DIAGNOSIS — M79675 Pain in left toe(s): Secondary | ICD-10-CM | POA: Diagnosis not present

## 2022-02-28 DIAGNOSIS — B351 Tinea unguium: Secondary | ICD-10-CM | POA: Diagnosis not present

## 2022-02-28 DIAGNOSIS — E119 Type 2 diabetes mellitus without complications: Secondary | ICD-10-CM

## 2022-02-28 DIAGNOSIS — Z7901 Long term (current) use of anticoagulants: Secondary | ICD-10-CM

## 2022-02-28 NOTE — Telephone Encounter (Signed)
Clinical info completed on DMV form.  Placed form in Dr. Jerolyn Center box for completion.  Sunday Spillers, CMA  ?

## 2022-02-28 NOTE — Telephone Encounter (Signed)
Patient dropped off form at front desk for [facility].  Verified that patient section of form has been completed.  Last DOS/WCC with PCP was 02/07/22.  Placed form in green team folder to be completed by clinical staff.  Vilinda Blanks  ?

## 2022-02-28 NOTE — Progress Notes (Signed)
Subjective: ?Meredith [name] is a 71 y.o. female patient with history of diabetes who presents to office today complaining of long,mildly painful nails  while ambulating in shoes; unable to trim. Patient states that her fasting blood sugar this morning was not recorded but last A1c noted at 7.2 and states that she has not had any more episodes or issues with her potassium level since last encounter. ? ? ?Patient Active Problem List  ? Diagnosis Date Noted  ? Dizziness 02/07/2022  ? 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 Posterior neck pain 11/03/2021  ? Torticollis, acute   ? Ingrown nail 08/16/2021  ? COVID-19 vaccination not done 08/16/2021  ? Colon cancer screening 08/16/2021  ? Influenza vaccination declined 08/16/2021  ? Tear of rotator cuff 05/04/2021  ? Leg pain, lateral, right 12/31/2019  ? Close exposure to COVID-19 virus 06/17/2019  ? Hypokalemia 12/04/2018  ? Restless leg syndrome 12/04/2018  ? Osteopenia 09/15/2017  ? Pain of both hip joints 06/13/2017  ? Knee pain, chronic 07/21/2016  ? Numbness of fingers 04/06/2016  ? Estrogen deficiency 03/29/2016  ? CAD (coronary artery disease), native coronary artery 10/26/2015  ? Back pain 08/17/2015  ? Persistent atrial fibrillation ([place]) 02/23/2015  ? PAC (premature atrial contraction) 01/19/2015  ? Healthcare maintenance 03/04/2014  ? HERNIA, VENTRAL 04/18/2010  ? DEGENERATIVE DISC DISEASE, LUMBAR SPINE 04/18/2010  ? Diabetes mellitus type II, controlled, with no complications ([place]) 59/74/1638  ? HYPERCHOLESTEROLEMIA 02/05/2007  ? Obesity 02/05/2007  ? DEPRESSION, MAJOR, RECURRENT 02/05/2007  ? HYPERTENSION, BENIGN SYSTEMIC 02/05/2007  ? GASTROESOPHAGEAL REFLUX, NO ESOPHAGITIS 02/05/2007  ? OSTEOARTHRITIS, MULTI SITES 02/05/2007  ? ?Current Outpatient Medications on File Prior to Visit  ?Medication Sig Dispense Refill  ? ACCU-CHEK AVIVA PLUS test strip USE AS INSTRUCTED TEST BLOOD GLUCOSE ONCE DAILY. ICD-10 CODE: E11.9 100 strip 12  ? Accu-Chek Softclix Lancets  lancets Use as instructed to test blood glucose once daily. ICD-10 code: E11.9 100 each 12  ? acetaminophen (ACETAMINOPHEN 8 HOUR) 650 MG CR tablet Take 1 tablet (650 mg total) by mouth every 8 (eight) hours. (Patient taking differently: Take 650 mg by mouth every 8 (eight) hours as needed for pain.) 30 tablet 0  ? amLODipine (NORVASC) 10 MG tablet Take 1 tablet (10 mg total) by mouth daily. 90 tablet 3  ? Bempedoic Acid-Ezetimibe (NEXLIZET) 180-10 MG TABS Take 1 tablet by mouth daily. 30 tablet 11  ? Blood Glucose Monitoring Suppl (ACCU-CHEK AVIVA PLUS) w/Device KIT 1 kit by Does not apply route as directed. ICD-10 code: E11.9 1 kit 0  ? diclofenac sodium (VOLTAREN) 1 % GEL Apply 2 g topically 4 (four) times daily. (Patient taking differently: Apply 2 g topically 4 (four) times daily as needed (knee pain).) 100 g 0  ? fexofenadine (ALLEGRA) 180 MG tablet Take 1 tablet (180 mg total) by mouth daily as needed for allergies. 90 tablet 3  ? INVOKANA 100 MG TABS tablet TAKE 1 TAB BY MOUTH DAILY BEFORE FIRST MEAL OF DAY FOR FOUR WEEKS. RETURN TO DR IN 3RD OR 4TH WEEK. 90 tablet 3  ? Lido-Capsaicin-Men-Methyl Sal 0.5-0.035-5-20 % PTCH Apply 1 each topically daily as needed. (Patient taking differently: Apply 1 each topically daily as needed (pain).) 60 patch 3  ? Lidocaine (HM LIDOCAINE PATCH) 4 % PTCH Apply 1 each topically daily as needed. (Patient taking differently: Apply 1 each topically daily as needed (pain).) 15 patch 12  ? metFORMIN (GLUCOPHAGE) 1000 MG tablet Take 1 tablet (1,000 mg total) by mouth  2 (two) times daily with a meal. Take 1 tablet twice daily with food. 180 tablet 3  ? metoprolol succinate (TOPROL-XL) 50 MG 24 hr tablet TAKE 1 TABLET BY MOUTH DAILY. TAKE WITH OR IMMEDIATELY FOLLOWING A MEAL. 90 tablet 3  ? potassium chloride (KLOR-CON) 10 MEQ tablet Take 2 tablets (20 mEq total) by mouth 2 (two) times daily. 360 tablet 2  ? rivaroxaban (XARELTO) 20 MG TABS tablet Take 1 tablet (20 mg total) by  mouth daily with supper. 90 tablet 3  ? rosuvastatin (CRESTOR) 40 MG tablet Take 1 tablet (40 mg total) by mouth daily. (Patient taking differently: Take 40 mg by mouth at bedtime.) 90 tablet 3  ? triamcinolone cream (KENALOG) 0.1 % Apply 1 application topically 2 (two) times daily. Apply twice a day for two weeks, then stop. 30 g 0  ? urea (GORDONS UREA) 40 % ointment Apply topically as needed. (Patient taking differently: Apply 1 application topically as needed (itching).) 30 g 0  ? ?No current facility-administered medications on file prior to visit.  ? ?Allergies  ?Allergen Reactions  ? Lisinopril Anaphylaxis, Shortness Of Breath and Swelling  ?  Mouth and throat became swollen  ? Doxycycline Swelling  ?  Possibly caused swelling of lips and hives in May 2019, timeline unclear  ? Flexeril [Cyclobenzaprine Hcl] Itching and Other (See Comments)  ?  Welts also  ? Lipitor [Atorvastatin Calcium] Other (See Comments)  ?  Myalgia  ? Metaxalone Itching and Other (See Comments)  ?  Welts also ?  ? ? ?Recent Results (from the past 2160 hour(s))  ?CBC     Status: Abnormal  ? Collection Time: 02/07/22  3:57 PM  ?Result Value Ref Range  ? WBC 6.9 3.4 - 10.8 x10E3/uL  ? RBC 4.54 3.77 - 5.28 x10E6/uL  ? Hemoglobin 11.7 11.1 - 15.9 g/dL  ? Hematocrit 35.7 34.0 - 46.6 %  ? MCV 79 79 - 97 fL  ? MCH 25.8 (L) 26.6 - 33.0 pg  ? MCHC 32.8 31.5 - 35.7 g/dL  ? RDW 14.7 11.7 - 15.4 %  ? Platelets 227 150 - 450 x10E3/uL  ?Basic Metabolic Panel     Status: Abnormal  ? Collection Time: 02/07/22  3:57 PM  ?Result Value Ref Range  ? Glucose 109 (H) 70 - 99 mg/dL  ? BUN 21 8 - 27 mg/dL  ? Creatinine, Ser 0.98 0.57 - 1.00 mg/dL  ? eGFR 62 >59 mL/min/1.73  ? BUN/Creatinine Ratio 21 12 - 28  ? Sodium 147 (H) 134 - 144 mmol/L  ? Potassium 4.0 3.5 - 5.2 mmol/L  ? Chloride 103 96 - 106 mmol/L  ? CO2 26 20 - 29 mmol/L  ? Calcium 10.5 (H) 8.7 - 10.3 mg/dL  ? ? ?Objective: ?General: Patient is awake, alert, and oriented x 3 and in no acute  distress. ? ?Integument: Skin is warm, dry and supple bilateral. Nails are tender, long, thickened and dystrophic with subungual debris, consistent with onychomycosis, 1-5 bilateral.  No acute signs of ingrowing at the left hallux.  No signs of infection. No open lesions or preulcerative lesions present bilateral. Remaining integument unremarkable. ? ?Vasculature:  Dorsalis Pedis pulse 1/4 bilateral. Posterior Tibial pulse 1/4 bilateral.  ?Capillary fill time <3 sec 1-5 bilateral.  Scant positive hair growth to the level of the digits. ?Temperature gradient within normal limits. No varicosities present bilateral. No edema present bilateral.  ? ?Neurology: The patient has intact sensation measured with a 5.07/10g Semmes  Weinstein Monofilament at all pedal sites bilateral . Vibratory sensation diminished bilateral with tuning fork. No Babinski sign present bilateral.  ? ?Musculoskeletal: No symptomatic pedal deformities noted bilateral. Muscular strength 5/5 in all lower extremity muscular groups bilateral without pain on range of motion . No tenderness with calf compression bilateral. ? ?Assessment and Plan: ?Problem List Items Addressed This Visit   ?None ?Visit Diagnoses   ? ? Pain due to onychomycosis of toenails of both feet    -  Primary  ? Diabetes mellitus without complication ([place])      ? Current use of long term anticoagulation      ? ?  ? ? ?-Examined patient. ?-Re-Discussed and educated patient on diabetic foot care, especially with  ?regards to the vascular, neurological and musculoskeletal systems.  ?-Mechanically debrided all painful nails 1-5 bilateral using sterile nail nipper and filed with dremel without incident  ?-Answered all patient questions ?-Patient to return  in 3 months for at risk foot care ?-Patient advised to call the office if any problems or questions arise in the meantime. ? ?Landis Martins, DPM ? ?

## 2022-03-04 NOTE — Telephone Encounter (Signed)
Form placed up front for pick up and a copy was made for batch scanning.  ? ?Patient has been notified.  ?

## 2022-03-07 ENCOUNTER — Other Ambulatory Visit: Payer: Self-pay | Admitting: Family Medicine

## 2022-03-07 DIAGNOSIS — R21 Rash and other nonspecific skin eruption: Secondary | ICD-10-CM

## 2022-03-14 ENCOUNTER — Other Ambulatory Visit: Payer: Medicare PPO

## 2022-03-14 DIAGNOSIS — E7800 Pure hypercholesterolemia, unspecified: Secondary | ICD-10-CM | POA: Diagnosis not present

## 2022-03-15 ENCOUNTER — Telehealth: Payer: Self-pay | Admitting: Pharmacist

## 2022-03-15 NOTE — Telephone Encounter (Signed)
Patient made aware of results. Slight improvement in LDL-C after starting Nexlizet. Patient on rosuvastatin 40mg . Continue nexlizet and rosuvastatin. Patient refused injections at lipid clinic appointment. ?

## 2022-03-27 LAB — LIPID PANEL
Chol/HDL Ratio: 2.7 ratio (ref 0.0–4.4)
Cholesterol, Total: 134 mg/dL (ref 100–199)
HDL: 49 mg/dL (ref >39)
LDL Chol Calc (NIH): 69 mg/dL (ref 0–99)
Triglycerides: 81 mg/dL (ref 0–149)
VLDL Cholesterol Cal: 16 mg/dL (ref 5–40)

## 2022-03-27 LAB — COMPREHENSIVE METABOLIC PANEL
ALT: 12 IU/L (ref 0–32)
AST: 19 IU/L (ref 0–40)
Albumin/Globulin Ratio: 1.8 (ref 1.2–2.2)
Albumin: 4.4 g/dL (ref 3.7–4.7)
Alkaline Phosphatase: 58 IU/L (ref 44–121)
BUN/Creatinine Ratio: 14 (ref 12–28)
BUN: 15 mg/dL (ref 8–27)
Bilirubin Total: 0.3 mg/dL (ref 0.0–1.2)
Calcium: 10.1 mg/dL (ref 8.7–10.3)
Chloride: 102 mmol/L (ref 96–106)
Creatinine, Ser: 1.07 mg/dL — ABNORMAL HIGH (ref 0.57–1.00)
Globulin, Total: 2.4 g/dL (ref 1.5–4.5)
Glucose: 156 mg/dL — ABNORMAL HIGH (ref 70–99)
Potassium: 4.2 mmol/L (ref 3.5–5.2)
Sodium: 140 mmol/L (ref 134–144)
Total Protein: 6.8 g/dL (ref 6.0–8.5)
eGFR: 55 mL/min/{1.73_m2} — ABNORMAL LOW (ref >59)

## 2022-04-11 ENCOUNTER — Ambulatory Visit (INDEPENDENT_AMBULATORY_CARE_PROVIDER_SITE_OTHER): Payer: Medicare PPO | Admitting: Family Medicine

## 2022-04-11 ENCOUNTER — Encounter: Payer: Self-pay | Admitting: Family Medicine

## 2022-04-11 DIAGNOSIS — R21 Rash and other nonspecific skin eruption: Secondary | ICD-10-CM

## 2022-04-11 DIAGNOSIS — I10 Essential (primary) hypertension: Secondary | ICD-10-CM | POA: Diagnosis not present

## 2022-04-11 DIAGNOSIS — L500 Allergic urticaria: Secondary | ICD-10-CM | POA: Diagnosis not present

## 2022-04-11 MED ORDER — OLOPATADINE HCL 0.1 % OP SOLN: 1.0000 [drp] | Freq: Two times a day (BID) | OPHTHALMIC | 12 refills | Status: DC

## 2022-04-11 MED ORDER — AQUAPHOR EX OINT: TOPICAL_OINTMENT | CUTANEOUS | 1 refills | Status: DC | PRN

## 2022-04-11 MED ORDER — TRIAMCINOLONE ACETONIDE 0.1 % EX CREA: 1.0000 "application " | TOPICAL_CREAM | Freq: Two times a day (BID) | CUTANEOUS | 0 refills | Status: DC

## 2022-04-11 NOTE — Patient Instructions (Addendum)
Thank you for coming to see me today. It was a pleasure. Today we talked about:  ? ?Use Aquaphor frequently for dry skin ? ?Continue Triamcinolone cream twice a day.  You can add 1 part Aquaphor and 1 part Triamcinolone. ? ?If no better in 2-3 weeks please follow up with PCP ? ?Pataday eye drops, 1 drop to each eye twice a day ? ?Follow up with Cardiology as scheduled ? ?Blood pressure elevated today.  Monitor at home 2-3 times a week.  If continues to be elevated please schedule appointment with PCP  ? ?If you have any questions or concerns, please do not hesitate to call the office at [phone]. ? ?Best,  ? ?Dana Allan, MD   ? ? ?Pruritus ?Pruritus is an itchy feeling on the skin. One of the most common causes is dry skin, but many different things can cause itching. Most cases of itching do not require medical attention. Sometimes itchy skin can turn into a rash. ?Follow these instructions at home: ?Skin care ? ?Apply moisturizing lotion to your skin as needed. Lotion that contains petroleum jelly is best. ?Take medicines or apply medicated creams only as told by your health care provider. This may include: ?Corticosteroid cream. ?Anti-itch lotions. ?Oral antihistamines. ?Apply a cool, wet cloth (cool compress) to the affected areas. ?Take baths with one of the following: ?Epsom salts. You can get these at your local pharmacy or grocery store. Follow the instructions on the packaging. ?Baking soda. Pour a small amount into the bath as told by your health care provider. ?Colloidal oatmeal. You can get this at your local pharmacy or grocery store. Follow the instructions on the packaging. ?Apply baking soda paste to your skin. To make the paste, stir water into a small amount of baking soda until it reaches a paste-like consistency. ?Do not scratch your skin. ?Do not take hot showers or baths, which can make itching worse. A cool shower may help with itching as long as you apply moisturizing lotion after the  shower. ?Do not use scented soaps, detergents, perfumes, and cosmetic products. Instead, use gentle, unscented versions of these items. ?General instructions ?Avoid wearing tight clothes. ?Keep a journal to help find out what is causing your itching. Write down: ?What you eat and drink. ?What cosmetic products you use. ?What soaps or detergents you use. ?What you wear, including jewelry. ?Use a humidifier. This keeps the air moist, which helps to prevent dry skin. ?Be aware of any changes in your itchiness. ?Contact a health care provider if: ?The itching does not go away after several days. ?You are unusually thirsty or urinating more than normal. ?Your skin tingles or feels numb. ?Your skin or the white parts of your eyes turn yellow (jaundice). ?You feel weak. ?You have any of the following: ?Night sweats. ?Tiredness (fatigue). ?Weight loss. ?Abdominal pain. ?Summary ?Pruritus is an itchy feeling on the skin. One of the most common causes is dry skin, but many different conditions and factors can cause itching. ?Apply moisturizing lotion to your skin as needed. Lotion that contains petroleum jelly is best. ?Take medicines or apply medicated creams only as told by your health care provider. ?Do not take hot showers or baths. Do not use scented soaps, detergents, perfumes, or cosmetic products. ?This information is not intended to replace advice given to you by your health care provider. Make sure you discuss any questions you have with your health care provider. ?Document Revised: 09/10/2021 Document Reviewed: 09/10/2021 ?Elsevier Patient Education ?  [place]. ? ?

## 2022-04-11 NOTE — Progress Notes (Signed)
? ? ?  SUBJECTIVE:  ? ?CHIEF COMPLAINT / HPI: itchy skin ? ?Continues to have generalized itching skin.  Was previously seen in clinic and treated for allergic urticaria.  Patient reports that she has been using Allegra and Triamcinolone as previously prescribed but continues to scratch and has become bothersome.  Reports that itching started about 2 months ago after switching laundry detergents. She has now resumed previous detergent.  She also notes that she has itchy eyes with increase clear fluid.   ? ?PERTINENT  PMH / PSH:  ?Multiple drug allergies, ACE anaphylaxis  ?HTN ?DM Type 2 ? ?OBJECTIVE:  ? ?BP (!) 150/70   Pulse 70   Ht 5\' 2"  (1.575 m)   Wt 216 lb 6 oz (98.1 kg)   SpO2 100%   BMI 39.58 kg/m?   ? ?General: Alert, no acute distress ?Cardio: Normal S1 and S2, RRR, no r/m/g ?Pulm: CTAB, normal work of breathing ?Derm: small area of maculopapular rash noted on upper arms bilaterally  ? ? ? ? ? ? ? ?ASSESSMENT/PLAN:  ? ?Allergic urticaria ?Itching has not much improved with medication.  Likely no improvement due to continued scratching.  Compared previous images and seems to be improving. ?Will trial Aquaphor, recommend to use liberally to moisturize skin  ?Can mix 1 part Aquaphor 1 part triamcinolone and use BID ?Continue Allegra daily ?Pataday eye drops for itching eyes ?Follow up with PCP in 2-3 weeks if no improvement.  Recommend discussion of history of anaphylaxis vs angioedema and consider Epi Pen previous episode of anaphylaxis ? ?HYPERTENSION, BENIGN SYSTEMIC ?Elevated today, repeat BP remains elevated. Had not taken BP meds today and HCTZ was discontinued 03/23 secondary to dizziness.   ?Reports sees Cardiology for HTN ?Monitor BP at home and if remains > 140/90 advised to schedule appointment with PCP ?Consider restarting HCTZ at half dose or increasing Metoprolol if HR tolerates ?Follow up with PCP in 1-2 weeks ?  ?Reports negative Cologuard 01/23.  Have requested that patient bring in a copy  of results to update chart ?Follows with ophthalmology at [facility].  Has follow up appointment for 02/2023 ? ?03/2023, MD ?[facility]  ?

## 2022-04-13 ENCOUNTER — Encounter: Payer: Self-pay | Admitting: Family Medicine

## 2022-04-13 NOTE — Assessment & Plan Note (Signed)
Elevated today, repeat BP remains elevated. Had not taken BP meds today and HCTZ was discontinued 03/23 secondary to dizziness.   ?Reports sees Cardiology for HTN ?Monitor BP at home and if remains > 140/90 advised to schedule appointment with PCP ?Consider restarting HCTZ at half dose or increasing Metoprolol if HR tolerates ?Follow up with PCP in 1-2 weeks ?

## 2022-04-13 NOTE — Assessment & Plan Note (Addendum)
Itching has not much improved with medication.  Likely no improvement due to continued scratching.  Compared previous images and seems to be improving. ?Will trial Aquaphor, recommend to use liberally to moisturize skin  ?Can mix 1 part Aquaphor 1 part triamcinolone and use BID ?Continue Allegra daily ?Pataday eye drops for itching eyes ?Follow up with PCP in 2-3 weeks if no improvement.  Recommend discussion of history of anaphylaxis vs angioedema and consider Epi Pen previous episode of anaphylaxis ?

## 2022-05-13 ENCOUNTER — Telehealth: Payer: Self-pay | Admitting: Family Medicine

## 2022-05-13 NOTE — Telephone Encounter (Signed)
Patient came in stating that she just wanted to let the doctor know that the cream she was prescribed is not working, she is still breaking out in hives/rash.

## 2022-05-13 NOTE — Telephone Encounter (Signed)
Can schedule an appointment with PCP

## 2022-05-13 NOTE — Telephone Encounter (Signed)
Will forward to Dr. Clent Ridges who saw patient for most recent appt. Audreyana Huntsberry,CMA

## 2022-05-14 NOTE — Telephone Encounter (Signed)
Patient is scheduled for 05-16-22 with [place].  Ravneet Spilker,CMA

## 2022-05-16 ENCOUNTER — Ambulatory Visit (INDEPENDENT_AMBULATORY_CARE_PROVIDER_SITE_OTHER): Payer: Medicare PPO | Admitting: Student

## 2022-05-16 ENCOUNTER — Encounter: Payer: Self-pay | Admitting: Student

## 2022-05-16 VITALS — BP 138/80 | HR 69 | Ht 62.0 in | Wt 212.0 lb

## 2022-05-16 DIAGNOSIS — M75101 Unspecified rotator cuff tear or rupture of right shoulder, not specified as traumatic: Secondary | ICD-10-CM | POA: Diagnosis not present

## 2022-05-16 DIAGNOSIS — L500 Allergic urticaria: Secondary | ICD-10-CM

## 2022-05-16 DIAGNOSIS — I4819 Other persistent atrial fibrillation: Secondary | ICD-10-CM | POA: Diagnosis not present

## 2022-05-16 MED ORDER — RIVAROXABAN 20 MG PO TABS: 20.0000 mg | ORAL_TABLET | Freq: Every day | ORAL | Status: DC

## 2022-05-16 NOTE — Assessment & Plan Note (Addendum)
Itching has not improved with medication.  Pataday drops have improved her itchy eyes.  Scheduled in dermatology clinic this afternoon.  Advise discussing itch/scratch cycle as this may be worsening her presentation.  Consider biopsy and/or allergy referral.

## 2022-05-16 NOTE — Progress Notes (Signed)
  SUBJECTIVE:   CHIEF COMPLAINT / HPI:   Goes away and comes back. Allegra has helped her eyes not water but has not helped the rash. She uses sensitive skin soaps and detergents. "If it says sensitive skin, I get it".   PERTINENT  PMH / PSH:   OBJECTIVE:  BP 138/80   Pulse 69   Ht 5\' 2"  (1.575 m)   Wt 212 lb (96.2 kg)   SpO2 97%   BMI 38.78 kg/m   General: NAD, pleasant, able to participate in exam Skin: scattered minimally raised, erythematous rash c/w urticaria on upper extremities and back; sparing the lower extremities, face, chest    ASSESSMENT/PLAN:  Allergic urticaria Evaluated further in dermatology clinic. Medication list reviewed. Chronic urticaria of unknown origin, potentially linked to initiation of Invokana.  Continue Allegra daily with addition of Benadryl OTC at night to assist with sleep. Opted not to prescribe systemic corticosteroids given T2DM. Recommended Cetaphil/CeraVe for moisturizer after showering. Referral sent to Allergy.    Orders Placed This Encounter  Procedures   Ambulatory referral to Allergy    Referral Priority:   Routine    Referral Type:   Allergy Testing    Referral Reason:   Specialty Services Required    Requested Specialty:   Allergy    Number of Visits Requested:   1   , DO 05/16/2022, 3:35 PM PGY-1, [facility]

## 2022-05-16 NOTE — Assessment & Plan Note (Signed)
Pulse 69.  Endorses taking metoprolol and Xarelto.  On med rec, Xarelto was not present.  No documentation noting discontinuation.  Added Xarelto back to medication list.

## 2022-05-16 NOTE — Assessment & Plan Note (Addendum)
Evaluated further in dermatology clinic. Medication list reviewed. Chronic urticaria of unknown origin, potentially linked to initiation of Invokana.  Continue Allegra daily with addition of Benadryl OTC at night to assist with sleep. Opted not to prescribe systemic corticosteroids given T2DM. Recommended Cetaphil/CeraVe for moisturizer after showering. Referral sent to Allergy.

## 2022-05-16 NOTE — Assessment & Plan Note (Signed)
[name] 1 year ago and CT scan showed no fracture but did show chronic rotator cuff and arthritic change.  Range of motion has still been affected.  Refer to physical therapy.

## 2022-05-16 NOTE — Progress Notes (Signed)
  SUBJECTIVE:   CHIEF COMPLAINT / HPI:   Right shoulder pain: Patient presents with right shoulder pain which has been persistent for the last year after falling.  CT scan showed chronic rotator cuff tear and arthritic change without fracture.  She states she does do some exercises at home but has not ever gone to physical therapy for it.  Rash: Patient has been using triamcinolone ointment as prescribed and Allegra daily.  She notes the rash came on to 3 months ago and has consistently been present on her arms and back.  The itching does wake her up and only improves when she gets in the shower.  It does wake her up sometimes.  She uses Dove sensitive soap and nothing with fragrance.  PERTINENT  PMH / PSH: HTN, persistent A-fib, CAD, GERD, T2DM, OA  OBJECTIVE:  BP 138/80   Pulse 69   Ht 5\' 2"  (1.575 m)   Wt 212 lb (96.2 kg)   SpO2 97%   BMI 38.78 kg/m   General: NAD, pleasant, able to participate in exam Cardiac: irregularly irregular, no murmurs auscultated. Respiratory: CTAB, normal effort, no wheezes, rales or rhonchi MSK: lessened active and passive ROM of right shoulder in extension and abduction, R shoulder +empty can test Skin: minimally raised, erythematous papules on right upper arm and left arm Psych: Normal affect and mood     ASSESSMENT/PLAN:  Tear of rotator cuff Fell 1 year ago and CT scan showed no fracture but did show chronic rotator cuff and arthritic change.  Range of motion has still been affected.  Refer to physical therapy.  Allergic urticaria Itching has not improved with medication.  Pataday drops have improved her itchy eyes.  Scheduled in dermatology clinic this afternoon.  Advise discussing itch/scratch cycle as this may be worsening her presentation.  Consider biopsy and/or allergy referral.  Persistent atrial fibrillation (HCC) Pulse 69.  Endorses taking metoprolol and Xarelto.  On med rec, Xarelto was not present.  No documentation noting  discontinuation.  Added Xarelto back to medication list.   Orders Placed This Encounter  Procedures   Ambulatory referral to Physical Therapy    Referral Priority:   Routine    Referral Type:   Physical Medicine    Referral Reason:   Specialty Services Required    Requested Specialty:   Physical Therapy    Number of Visits Requested:   1   Meds ordered this encounter  Medications   rivaroxaban (XARELTO) 20 MG TABS tablet    Sig: Take 1 tablet (20 mg total) by mouth daily with supper. With a meal    Dispense:  30 tablet   Return today (on 05/16/2022) for rash derm clinic. 07/16/2022, DO 05/16/2022, 2:24 PM PGY-1, [facility]

## 2022-05-16 NOTE — Patient Instructions (Signed)
It was great to see you today! Thank you for choosing [facility] for your primary care. Meredith [name] was seen for rash.  Today we addressed: Rash: I am sorry we have not pinpointed exactly what may be causing this. I recommend continuing taking the Fexofenadine (Allegra) daily, in addition to taking Benadryl allergy over the counter at nighttime to assist with itching during sleep. A good moisturizer to use after getting out of the shower would be Cetaphil or CeraVe moisturizer. They are dermatologically recommended for anyone with sensitive skin. Lastly, I am referring you to allergy to get further workup of what may be causing this.   Orders Placed This Encounter  Procedures   Ambulatory referral to Allergy    Referral Priority:   Routine    Referral Type:   Allergy Testing    Referral Reason:   Specialty Services Required    Requested Specialty:   Allergy    Number of Visits Requested:   1   If you haven't already, sign up for My Chart to have easy access to your labs results, and communication with your primary care physician.  I recommend that you always bring your medications to each appointment as this makes it easy to ensure you are on the correct medications and helps [place] not miss refills when you need them.  Please arrive 15 minutes before your appointment to ensure smooth check in process.  We appreciate your efforts in making this happen.  Please call the clinic at [phone] if your symptoms worsen or you have any concerns.  Thank you for allowing me to participate in your care, Meredith Mattocks, DO 05/16/2022, 3:08 PM PGY-1, [facility]

## 2022-05-16 NOTE — Patient Instructions (Signed)
It was great to see you today! Thank you for choosing [facility] for your primary care. Meredith [name] was seen for rash along right shoulder discomfort.  Today we addressed: Rash: We will see you in our dermatology clinic this afternoon. Shoulder pain: I have referred you to physical therapy. A-fib: We will manage that I put your Xarelto prescription on here.  For some reason, it was discontinued in your file but you should be taking this daily as you have been.   Orders Placed This Encounter  Procedures   Ambulatory referral to Physical Therapy    Referral Priority:   Routine    Referral Type:   Physical Medicine    Referral Reason:   Specialty Services Required    Requested Specialty:   Physical Therapy    Number of Visits Requested:   1   Meds ordered this encounter  Medications   rivaroxaban (XARELTO) 20 MG TABS tablet    Sig: Take 1 tablet (20 mg total) by mouth daily with supper. With a meal    Dispense:  30 tablet    If you haven't already, sign up for My Chart to have easy access to your labs results, and communication with your primary care physician.  You should return to our clinic Return today (on 05/16/2022) for rash derm clinic.  I recommend that you always bring your medications to each appointment as this makes it easy to ensure you are on the correct medications and helps [place] not miss refills when you need them.  Please arrive 15 minutes before your appointment to ensure smooth check in process.  We appreciate your efforts in making this happen.  Please call the clinic at [phone] if your symptoms worsen or you have any concerns.  Thank you for allowing me to participate in your care, [name], DO 05/16/2022, 9:27 AM PGY-1, [facility]

## 2022-06-03 ENCOUNTER — Ambulatory Visit (INDEPENDENT_AMBULATORY_CARE_PROVIDER_SITE_OTHER): Payer: Medicare PPO | Admitting: Podiatry

## 2022-06-03 ENCOUNTER — Encounter: Payer: Self-pay | Admitting: Podiatry

## 2022-06-03 DIAGNOSIS — M79675 Pain in left toe(s): Secondary | ICD-10-CM | POA: Diagnosis not present

## 2022-06-03 DIAGNOSIS — M79674 Pain in right toe(s): Secondary | ICD-10-CM

## 2022-06-03 DIAGNOSIS — E119 Type 2 diabetes mellitus without complications: Secondary | ICD-10-CM

## 2022-06-03 DIAGNOSIS — L84 Corns and callosities: Secondary | ICD-10-CM

## 2022-06-03 DIAGNOSIS — B351 Tinea unguium: Secondary | ICD-10-CM | POA: Diagnosis not present

## 2022-06-10 NOTE — Progress Notes (Signed)
  Subjective:  Patient ID: Meredith [name], female    DOB: November 03, 1951,  MRN: [id number]  Meredith [name] presents to clinic today for preventative diabetic foot care and callus(es) left lower extremity and painful thick toenails that are difficult to trim. Painful toenails interfere with ambulation. Aggravating factors include wearing enclosed shoe gear. Pain is relieved with periodic professional debridement. Painful calluses are aggravated when weightbearing with and without shoegear. Pain is relieved with periodic professional debridement.  Last A1c was around 7%. Patient did not check blood glucose today.  New problem(s): None.   PCP is Fayette Pho, MD , and last visit was August 16, 2021.  Allergies  Allergen Reactions   Lisinopril Anaphylaxis, Shortness Of Breath and Swelling    Mouth and throat became swollen   Doxycycline Swelling    Possibly caused swelling of lips and hives in May 2019, timeline unclear   Flexeril [Cyclobenzaprine Hcl] Itching and Other (See Comments)    Welts also   Lipitor [Atorvastatin Calcium] Other (See Comments)    Myalgia   Metaxalone Itching and Other (See Comments)    Welts also     Review of Systems: Negative except as noted in the HPI.  Objective: No changes noted in today's physical examination. Objective:   Vascular Examination: DP pulses faintly palpable b/l. PT pulses faintly palpable b/l. Pedal hair absent b/l LE. No pedal edema b/l.  CFT <3 seconds b/l LE.  Neurological Examination: Sensation grossly intact b/l with 10 gram monofilament. Vibratory sensation diminished b/l.  Dermatological Examination: Pedal skin with normal turgor, texture and tone b/l. Toenails 1-5 b/l thick, discolored, elongated with subungual debris and pain on dorsal palpation. Hyperkeratotic lesion(s) plantarlateral aspect of midfoot left foot.  No erythema, no edema, no drainage, no fluctuance.   Musculoskeletal Examination: Muscle strength 5/5 to b/l  LE. No pain, crepitus or joint limitation noted with ROM bilateral LE.  Radiographs: None      Latest Ref Rng & Units 11/01/2021    4:59 AM  Hemoglobin A1C  Hemoglobin-A1c 4.8 - 5.6 % 7.2    Assessment/Plan: 1. Pain due to onychomycosis of toenails of both feet   2. Callus   3. Diabetes mellitus without complication (HCC)      -Patient was evaluated and treated. All patient's and/or POA's questions/concerns answered on today's visit. -Patient to continue soft, supportive shoe gear daily. -Toenails 1-5 b/l were debrided in length and girth with sterile nail nippers and dremel without iatrogenic bleeding.  -Callus(es) plantarlateral aspect of midfoot left foot pared utilizing sterile scalpel blade without complication or incident. Total number debrided =1. -Patient/POA to call should there be question/concern in the interim.   No follow-ups on file.  Freddie Breech, DPM

## 2022-06-24 ENCOUNTER — Other Ambulatory Visit: Payer: Self-pay | Admitting: Cardiology

## 2022-06-24 ENCOUNTER — Other Ambulatory Visit: Payer: Self-pay | Admitting: Family Medicine

## 2022-06-24 DIAGNOSIS — R21 Rash and other nonspecific skin eruption: Secondary | ICD-10-CM

## 2022-07-01 ENCOUNTER — Ambulatory Visit: Payer: Medicare PPO | Admitting: Podiatry

## 2022-09-06 ENCOUNTER — Encounter (HOSPITAL_COMMUNITY): Payer: Self-pay

## 2022-09-06 ENCOUNTER — Emergency Department (HOSPITAL_COMMUNITY): Payer: Medicare PPO

## 2022-09-06 ENCOUNTER — Other Ambulatory Visit: Payer: Self-pay

## 2022-09-06 ENCOUNTER — Emergency Department (HOSPITAL_COMMUNITY)
Admission: EM | Admit: 2022-09-06 | Discharge: 2022-09-06 | Disposition: A | Discharge status: Home / Self Care | Payer: Medicare PPO | Attending: Emergency Medicine | Admitting: Emergency Medicine

## 2022-09-06 DIAGNOSIS — I951 Orthostatic hypotension: Secondary | ICD-10-CM | POA: Diagnosis not present

## 2022-09-06 DIAGNOSIS — R0902 Hypoxemia: Secondary | ICD-10-CM | POA: Diagnosis not present

## 2022-09-06 DIAGNOSIS — R0602 Shortness of breath: Secondary | ICD-10-CM | POA: Diagnosis not present

## 2022-09-06 DIAGNOSIS — E119 Type 2 diabetes mellitus without complications: Secondary | ICD-10-CM | POA: Insufficient documentation

## 2022-09-06 DIAGNOSIS — R42 Dizziness and giddiness: Secondary | ICD-10-CM | POA: Insufficient documentation

## 2022-09-06 DIAGNOSIS — R55 Syncope and collapse: Secondary | ICD-10-CM | POA: Diagnosis not present

## 2022-09-06 DIAGNOSIS — Z7901 Long term (current) use of anticoagulants: Secondary | ICD-10-CM | POA: Insufficient documentation

## 2022-09-06 DIAGNOSIS — Z7984 Long term (current) use of oral hypoglycemic drugs: Secondary | ICD-10-CM | POA: Diagnosis not present

## 2022-09-06 DIAGNOSIS — I2510 Atherosclerotic heart disease of native coronary artery without angina pectoris: Secondary | ICD-10-CM | POA: Insufficient documentation

## 2022-09-06 DIAGNOSIS — Z79899 Other long term (current) drug therapy: Secondary | ICD-10-CM | POA: Diagnosis not present

## 2022-09-06 DIAGNOSIS — R000 Tachycardia, unspecified: Secondary | ICD-10-CM | POA: Diagnosis not present

## 2022-09-06 DIAGNOSIS — I10 Essential (primary) hypertension: Secondary | ICD-10-CM | POA: Insufficient documentation

## 2022-09-06 DIAGNOSIS — I4891 Unspecified atrial fibrillation: Secondary | ICD-10-CM | POA: Diagnosis not present

## 2022-09-06 LAB — BASIC METABOLIC PANEL
Anion gap: 9 (ref 5–15)
BUN: 14 mg/dL (ref 8–23)
CO2: 25 mmol/L (ref 22–32)
Calcium: 9.8 mg/dL (ref 8.9–10.3)
Chloride: 109 mmol/L (ref 98–111)
Creatinine, Ser: 0.91 mg/dL (ref 0.44–1.00)
GFR, Estimated: >60 mL/min (ref >60)
Glucose, Bld: 141 mg/dL — ABNORMAL HIGH (ref 70–99)
Potassium: 3.5 mmol/L (ref 3.5–5.1)
Sodium: 143 mmol/L (ref 135–145)

## 2022-09-06 LAB — CBC
HCT: 35.8 % — ABNORMAL LOW (ref 36.0–46.0)
Hemoglobin: 12.2 g/dL (ref 12.0–15.0)
MCH: 26.3 pg (ref 26.0–34.0)
MCHC: 34.1 g/dL (ref 30.0–36.0)
MCV: 77.2 fL — ABNORMAL LOW (ref 80.0–100.0)
Platelets: 245 10*3/uL (ref 150–400)
RBC: 4.64 MIL/uL (ref 3.87–5.11)
RDW: 14.9 % (ref 11.5–15.5)
WBC: 5.9 10*3/uL (ref 4.0–10.5)
nRBC: 0 % (ref 0.0–0.2)

## 2022-09-06 LAB — TROPONIN I (HIGH SENSITIVITY)
Troponin I (High Sensitivity): <2 ng/L (ref <18)
Troponin I (High Sensitivity): 3 ng/L (ref <18)

## 2022-09-06 LAB — MAGNESIUM: Magnesium: 1.5 mg/dL — ABNORMAL LOW (ref 1.7–2.4)

## 2022-09-06 MED ORDER — MAGNESIUM OXIDE -MG SUPPLEMENT 400 (240 MG) MG PO TABS
400.0000 mg | ORAL_TABLET | Freq: Once | ORAL | Status: AC
  Administered 2022-09-06: 400 mg via ORAL
  Filled 2022-09-06: qty 1

## 2022-09-06 NOTE — ED Provider Triage Note (Signed)
Emergency Medicine Provider Triage Evaluation Note  Meredith [name] , a 71 y.o. female  was evaluated in triage.  Pt complains of near syncope. Patient found to be in A. Fib with RVR HR 150s upon EMS arrival. Patient has history of persistent A. Fib. HR normalized en route, now 80s. Patient has been compliant with Xarelto. No chest pain.   Review of Systems  Positive: palpitations Negative: fever  Physical Exam  BP (!) 155/86 (BP Location: Right Arm)   Pulse 92   Temp 98.2 F (36.8 C) (Oral)   Resp 16   SpO2 95%  Gen:   Awake, no distress   Resp:  Normal effort  MSK:   Moves extremities without difficulty  Other:    Medical Decision Making  Medically screening exam initiated at 12:32 PM.  Appropriate orders placed.  Meredith [name] was informed that the remainder of the evaluation will be completed by another provider, this initial triage assessment does not replace that evaluation, and the importance of remaining in the ED until their evaluation is complete.  A. Fib labs   Suzy Bouchard, [place] 09/06/22 1233

## 2022-09-06 NOTE — ED Triage Notes (Signed)
Patient arrived from work by [employer] report that patient had near syncopal event while at work. EMS found in rapid atrial fib at 150s, once moved to truck rate now in 80s. Patient has had similar event last week as well and not seen for same.

## 2022-09-06 NOTE — ED Provider Notes (Signed)
[facility] EMERGENCY DEPARTMENT Provider Note   CSN: [id number] Arrival date & time: 09/06/22  1218     History  No chief complaint on file.   Meredith [name] is a 71 y.o. female with HTN, OA, persistent A-fib on Xarelto, T2DM, obesity, GERD, ventral hernia, CAD, RLS presents with pre-syncope, A-fib with RVR.   Patient states she was in her normal state of health at work and lifting and moving things around when she stood up too quickly and became lightheaded.  Her coworkers became worried about her, she insisted that the they just let her sit for a moment she would feel better but they called 911.  Found to be with HR in 150s on EMS arrival. HR normalized en route to ED, now 80s bpm. Compliant on Xarelto, denies CP, SOB. Denies current lightheadedness/palpitations.  States she feels completely normal right now and has since she got into the ambulance.  States she only came to the hospital because her coworkers insisted she get checked out.  Has mild swelling in her lower extremities symmetric which is normal for her.   HPI     Home Medications Prior to Admission medications   Medication Sig Start Date End Date Taking? Authorizing Provider  ACCU-CHEK AVIVA PLUS test strip USE AS INSTRUCTED TEST BLOOD GLUCOSE ONCE DAILY. ICD-10 CODE: E11.9 01/21/22   Ezequiel Essex, MD  Accu-Chek Softclix Lancets lancets Use as instructed to test blood glucose once daily. ICD-10 code: E11.9 10/27/20   Ezequiel Essex, MD  acetaminophen (ACETAMINOPHEN 8 HOUR) 650 MG CR tablet Take 1 tablet (650 mg total) by mouth every 8 (eight) hours. Patient taking differently: Take 650 mg by mouth every 8 (eight) hours as needed for pain. 10/22/18   Mesner, Corene Cornea, MD  amLODipine (NORVASC) 10 MG tablet Take 1 tablet (10 mg total) by mouth daily. 08/09/21   Sueanne Margarita, MD  Bempedoic Acid-Ezetimibe (NEXLIZET) 180-10 MG TABS Take 1 tablet by mouth daily. 12/20/21   Sueanne Margarita, MD  Blood Glucose  Monitoring Suppl (ACCU-CHEK AVIVA PLUS) w/Device KIT 1 kit by Does not apply route as directed. ICD-10 code: E11.9 09/17/17   McDiarmid, Blane Ohara, MD  diclofenac sodium (VOLTAREN) 1 % GEL Apply 2 g topically 4 (four) times daily. Patient taking differently: Apply 2 g topically 4 (four) times daily as needed (knee pain). 08/27/19   Sherene Sires, DO  fexofenadine (ALLEGRA) 180 MG tablet Take 1 tablet (180 mg total) by mouth daily as needed for allergies. 02/04/22   Gladys Damme, MD  INVOKANA 100 MG TABS tablet TAKE 1 TAB BY MOUTH DAILY BEFORE FIRST MEAL OF DAY FOR FOUR WEEKS. RETURN TO DR IN 3RD OR 4TH WEEK. 01/09/22   Ezequiel Essex, MD  Lido-Capsaicin-Men-Methyl Sal 0.5-0.035-5-20 % [facility] Apply 1 each topically daily as needed. Patient not taking: Reported on 05/16/2022 10/27/20   Ezequiel Essex, MD  Lidocaine (HM LIDOCAINE PATCH) 4 % [facility] Apply 1 each topically daily as needed. Patient not taking: Reported on 05/16/2022 10/27/20   Ezequiel Essex, MD  metFORMIN (GLUCOPHAGE) 1000 MG tablet Take 1 tablet (1,000 mg total) by mouth 2 (two) times daily with a meal. Take 1 tablet twice daily with food. 01/04/22   Ezequiel Essex, MD  metoprolol succinate (TOPROL-XL) 50 MG 24 hr tablet TAKE 1 TABLET BY MOUTH DAILY. TAKE WITH OR IMMEDIATELY FOLLOWING A MEAL. 01/21/22   Ezequiel Essex, MD  mineral oil-hydrophilic petrolatum (AQUAPHOR) ointment Apply topically as needed for dry skin. 04/11/22  Carollee Leitz, MD  olopatadine (PATADAY) 0.1 % ophthalmic solution Place 1 drop into both eyes 2 (two) times daily. 04/11/22   Carollee Leitz, MD  potassium chloride (KLOR-CON) 10 MEQ tablet Take 2 tablets (20 mEq total) by mouth 2 (two) times daily. 11/06/21   Sueanne Margarita, MD  rivaroxaban (XARELTO) 20 MG TABS tablet Take 1 tablet (20 mg total) by mouth daily with supper. With a meal 05/16/22   Martyn Malay, MD  rosuvastatin (CRESTOR) 40 MG tablet TAKE 1 TABLET BY MOUTH EVERY DAY 06/25/22   Sueanne Margarita, MD  triamcinolone  cream (KENALOG) 0.1 % Apply 1 application. topically 2 (two) times daily. Apply twice a day for two weeks, then stop. 04/11/22   Carollee Leitz, MD  urea (GORDONS UREA) 40 % ointment Apply topically as needed. Patient not taking: Reported on 05/16/2022 08/16/21   Ezequiel Essex, MD      Allergies    Lisinopril, Doxycycline, Flexeril [cyclobenzaprine hcl], Lipitor [atorvastatin calcium], and Metaxalone    Review of Systems   Review of Systems Review of systems negative for LOC.  A 10 point review of systems was performed and is negative unless otherwise reported in HPI.  Physical Exam Updated Vital Signs BP (!) 147/77 (BP Location: Left Arm)   Pulse 84   Temp 98.1 F (36.7 C) (Oral)   Resp 12   SpO2 99%  Physical Exam General: Normal appearing female, lying in bed.  HEENT: PERRLA, Sclera anicteric, MMM, trachea midline, no JVD. Cardiology: Irregular rhythm, normal rate, no murmurs/rubs/gallops. BL radial and DP pulses equal bilaterally.  Resp: Normal respiratory rate and effort. CTAB, no wheezes, rhonchi, crackles.  Abd: Soft, non-tender, non-distended. No rebound tenderness or guarding.  GU: Deferred. MSK: 1+ nonpitting edema BL ankles. No signs of trauma. Extremities without deformity or TTP. No cyanosis or clubbing. Skin: warm, dry. No rashes or lesions. Back: No CVA tenderness Neuro: A&Ox4, CNs II-XII grossly intact. MAEs. Sensation grossly intact.  Psych: Normal mood and affect.   ED Results / Procedures / Treatments   Labs (all labs ordered are listed, but only abnormal results are displayed) Labs Reviewed  BASIC METABOLIC PANEL - Abnormal; Notable for the following components:      Result Value   Glucose, Bld 141 (*)    All other components within normal limits  MAGNESIUM - Abnormal; Notable for the following components:   Magnesium 1.5 (*)    All other components within normal limits  CBC - Abnormal; Notable for the following components:   HCT 35.8 (*)    MCV 77.2 (*)     All other components within normal limits  TROPONIN I (HIGH SENSITIVITY)  TROPONIN I (HIGH SENSITIVITY)    EKG EKG Interpretation  Date/Time:  Friday September 06 2022 12:23:52 EDT Ventricular Rate:  91 PR Interval:  208 QRS Duration: 92 QT Interval:  356 QTC Calculation: 437 R Axis:   -29 Text Interpretation: Sinus rhythm with  1st degree AV block with Minimal voltage criteria for LVH, may be normal variant ( Cornell product )  No acute changes When compared with ECG of 31-Oct-2021 18:28, PREVIOUS ECG IS PRESENT  Confirmed by Cindee Lame [phone]) on 09/06/2022 5:33:03 PM  Radiology DG Chest 1 View  Result Date: 09/06/2022 CLINICAL DATA:  SOB EXAM: CHEST  1 VIEW COMPARISON:  10/31/2021 FINDINGS: The heart size and mediastinal contours are within normal limits. Both lungs are clear. The visualized skeletal structures are unremarkable. IMPRESSION: No active disease. Electronically Signed  By: Kathreen Devoid M.D.   On: 09/06/2022 13:04    Procedures Procedures    Medications Ordered in ED Medications  magnesium oxide (MAG-OX) tablet 400 mg (has no administration in time range)    ED Course/ Medical Decision Making/ A&P                          Medical Decision Making Risk OTC drugs.    Patient with lightheadedness w/ standing that was transient, resolved on its own. EMS found Afib w/ RVR in 150s rate, but normalized en route. Patient now HDS, well-appearing, feels normal, states she would like to be discharged.  Given history exam and work-up, very low suspicion for ICH, seizure, stroke, ACS, hemorrhage, or PE.  Patient is completely asymptomatic at this time.  Patient has been compliant with her medications including Xarelto and metoprolol, but possible the patient had an arrhythmia at that time causing lightheadedness, given that her heart rate was found in the 150s.  Also possible she, as per report, just experienced orthostatic lightheadedness that resolved on its  own.  Also will consider anemia, electrolyte abnormalities, hypoglycemia.  EKG currently demonstrates normal sinus rhythm with first-degree block, premature atrial complexes, no different from prior EKGs that have been read as either sinus rhythm with PACs or rate stable A-fib.  No delta wave, no prolonged QT, no ST elevations or depressions.  Labs demonstrate sodium 143, potassium 3.5, glucose 141, no leukocytosis, no anemia.  Magnesium slightly low at 1.5, will replete.  Troponin 3.  I have personally reviewed and interpreted all labs and imaging.   Labs very reassuring.  Patient was likely orthostatic earlier from standing up too fast.  Repleted magnesium.  Patient feels well and would like to be discharged.  She is instructed to follow-up with her primary care physician or cardiologist within the week and have her labs rechecked.  Patient reports understanding, given discharge instructions and return precautions, all questions answered to the satisfaction.  Dispo: DC          Final Clinical Impression(s) / ED Diagnoses Final diagnoses:  Orthostatic lightheadedness  Hypomagnesemia    Rx / DC Orders ED Discharge Orders     None        This note was created using dictation software, which may contain spelling or grammatical errors.    Audley Hose, MD 09/06/22 [phone]

## 2022-09-06 NOTE — Discharge Instructions (Signed)
Thank you for coming to [facility] Emergency Department. You were seen for lightheadedness with standing and fast heart rate. We did an exam, labs, and imaging, and these showed only a slightly low magnesium, which was repleted.  You likely had an episode of orthostatic lightheadedness caused by standing up too quickly, like you stated. Please follow up with your primary care provider or cardiologist within 1 week. Call them on Monday morning to make an appointment.  Do not hesitate to return to the ED or call 911 if you experience: -Worsening symptoms -Chest pain, shortness of breath -Lightheadedness, passing out -Fevers/chills -Anything else that concerns you

## 2022-09-06 NOTE — ED Notes (Signed)
Patient ambulatory at discharge. Patient denies any further questions. IV removed.

## 2022-09-09 ENCOUNTER — Telehealth: Payer: Self-pay

## 2022-09-09 NOTE — Patient Outreach (Signed)
  Care Coordination [facility] Note Transition Care Management Follow-up Telephone Call Date of discharge and from where[facility] How have you been since you were released from the hospital? "I feel fine now, thank you for calling.  I am at work". Any questions or concerns? No  Items Reviewed: Did the pt receive and understand the discharge instructions provided? Yes  Medications obtained and verified? Yes  Other? No  Any new allergies since your discharge? No  Dietary orders reviewed? No Do you have support at home? Yes   Home Care and Equipment/Supplies: Were home health services ordered? no If so, what is the name of the agency? N/A  Has the agency set up a time to come to the patient's home? not applicable Were any new equipment or medical supplies ordered?  No What is the name of the medical supply agency? N/A Were you able to get the supplies/equipment? not applicable Do you have any questions related to the use of the equipment or supplies? No  Functional Questionnaire: (I = Independent and D = Dependent) ADLs: I  Bathing/Dressing- I  Meal Prep- I  Eating- I  Maintaining continence- I  Transferring/Ambulation- I  Managing Meds- I  Follow up appointments reviewed:  PCP Hospital f/u appt confirmed? No   Specialist Hospital f/u appt confirmed? No   Are transportation arrangements needed? No  If their condition worsens, is the pt aware to call PCP or go to the Emergency Dept.? Yes Was the patient provided with contact information for the PCP's office or ED? Yes Was to pt encouraged to call back with questions or concerns? Yes  SDOH assessments and interventions completed:   Yes  Care Coordination Interventions Activated:  Yes   Care Coordination Interventions:  No Care Coordination interventions needed at this time.   Encounter Outcome:  Pt. Visit Completed

## 2022-09-11 ENCOUNTER — Telehealth: Payer: Self-pay | Admitting: *Deleted

## 2022-09-11 NOTE — Telephone Encounter (Signed)
     Patient  visit on 09/06/2022  at [facility] ED was for dizziness   Have you been able to follow up with your primary care physician? Yes   The patient was able to obtain any needed medicine or equipment.  Are there diet recommendations that you are having difficulty following?  Patient expresses understanding of discharge instructions and education provided has no other needs at this time.    [place] [phone] [address] , [place] [address] Email : [email or web address]

## 2022-09-13 ENCOUNTER — Ambulatory Visit: Payer: Medicare PPO | Admitting: Podiatry

## 2022-09-13 ENCOUNTER — Encounter: Payer: Self-pay | Admitting: Podiatry

## 2022-09-13 DIAGNOSIS — M2142 Flat foot [pes planus] (acquired), left foot: Secondary | ICD-10-CM

## 2022-09-13 DIAGNOSIS — M79675 Pain in left toe(s): Secondary | ICD-10-CM | POA: Diagnosis not present

## 2022-09-13 DIAGNOSIS — B351 Tinea unguium: Secondary | ICD-10-CM | POA: Diagnosis not present

## 2022-09-13 DIAGNOSIS — M2141 Flat foot [pes planus] (acquired), right foot: Secondary | ICD-10-CM | POA: Diagnosis not present

## 2022-09-13 DIAGNOSIS — E119 Type 2 diabetes mellitus without complications: Secondary | ICD-10-CM

## 2022-09-13 DIAGNOSIS — M79674 Pain in right toe(s): Secondary | ICD-10-CM | POA: Diagnosis not present

## 2022-09-13 NOTE — Progress Notes (Signed)
ANNUAL DIABETIC FOOT EXAM  Subjective: Meredith [name] presents today for annual diabetic foot examination.  Chief Complaint  Patient presents with   Nail Problem    Diabetic foot care BS-did not check today A1C-7.? PCP-Catherine Jeani Hawking PCP VST-2 MONTHS AGO    Patient confirms h/o diabetes.  Patient relates 30 year h/o diabetes.  Patient denies any h/o foot wounds.  Patient denies any numbness, tingling, burning, or pins/needle sensation in feet.  Patient did not check blood glucose this morning.  Patient does not monitor blood glucose daily.  Risk factors: diabetes, HTN, CAD, hyperlipidemia, h/o tobacco use in remission.  Ezequiel Essex, MD is patient's PCP. Last visit was July, 2023.  Past Medical History:  Diagnosis Date   ANEMIA, IRON DEFICIENCY, UNSPEC. 02/05/2007   Qualifier: Diagnosis of  By: Damita Dunnings MD, Phillip Heal     ASTHMA, INTERMITTENT 09/07/2010   Qualifier: Diagnosis of  By: Jess Barters MD, Cindee Salt     CAD (coronary artery disease), native coronary artery 02/16/2015   Cath with 20% LCx and RCA   Colon polyps    CTS (carpal tunnel syndrome) 06/06/2016   Diabetes mellitus    Diverticulosis 05/17/12   DJD (degenerative joint disease) of lumbar spine    Enteritis    Gastric ulcer 04/2000   Hyperlipidemia    Hypertension    Irregular heart beat    Obesity    Osteoarthritis    Persistent atrial fibrillation (HCC)    CHADS2VASC score is 5 and on Xarelto   Pruritus 02/21/2021   Rash 02/15/2021   Renal cyst, left 05/17/12   Ventral hernia    Patient Active Problem List   Diagnosis Date Noted   Dizziness 02/07/2022   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   Posterior neck pain 11/03/2021   Torticollis, acute    Ingrown nail 08/16/2021   COVID-19 vaccination not done 08/16/2021   Colon cancer screening 08/16/2021   Influenza vaccination declined 08/16/2021   Tear of rotator cuff 05/04/2021   Leg pain, lateral, right 12/31/2019   Close exposure to COVID-19 virus 06/17/2019    Hypokalemia 12/04/2018   Restless leg syndrome 12/04/2018   Osteopenia 09/15/2017   Pain of both hip joints 06/13/2017   Knee pain, chronic 07/21/2016   Numbness of fingers 04/06/2016   Estrogen deficiency 03/29/2016   CAD (coronary artery disease), native coronary artery 10/26/2015   Back pain 08/17/2015   Persistent atrial fibrillation ([place]) 02/23/2015   PAC (premature atrial contraction) 01/19/2015   Healthcare maintenance 03/04/2014   HERNIA, VENTRAL 04/18/2010   DEGENERATIVE DISC DISEASE, LUMBAR SPINE 04/18/2010   Diabetes mellitus type II, controlled, with no complications ([place]) 27/05/2375   HYPERCHOLESTEROLEMIA 02/05/2007   Obesity 02/05/2007   DEPRESSION, MAJOR, RECURRENT 02/05/2007   HYPERTENSION, BENIGN SYSTEMIC 02/05/2007   GASTROESOPHAGEAL REFLUX, NO ESOPHAGITIS 02/05/2007   OSTEOARTHRITIS, MULTI SITES 02/05/2007   Past Surgical History:  Procedure Laterality Date   ABDOMINAL HYSTERECTOMY     LEFT HEART CATHETERIZATION WITH CORONARY ANGIOGRAM N/A 02/16/2015   Procedure: LEFT HEART CATHETERIZATION WITH CORONARY ANGIOGRAM;  Surgeon: Burnell Blanks, MD;  Location: [facility] CATH LAB;  Service: Cardiovascular;  Laterality: N/A;   OPERATIVE HYSTEROSCOPY     TUBAL LIGATION     UTERINE FIBROID SURGERY     Current Outpatient Medications on File Prior to Visit  Medication Sig Dispense Refill   ACCU-CHEK AVIVA PLUS test strip USE AS INSTRUCTED TEST BLOOD GLUCOSE ONCE DAILY. ICD-10 CODE: E11.9 100 strip 12   Accu-Chek Softclix Lancets lancets Use as instructed  to test blood glucose once daily. ICD-10 code: E11.9 100 each 12   acetaminophen (ACETAMINOPHEN 8 HOUR) 650 MG CR tablet Take 1 tablet (650 mg total) by mouth every 8 (eight) hours. (Patient taking differently: Take 650 mg by mouth every 8 (eight) hours as needed for pain.) 30 tablet 0   amLODipine (NORVASC) 10 MG tablet Take 1 tablet (10 mg total) by mouth daily. 90 tablet 3   Bempedoic Acid-Ezetimibe (NEXLIZET) 180-10 MG  TABS Take 1 tablet by mouth daily. 30 tablet 11   Blood Glucose Monitoring Suppl (ACCU-CHEK AVIVA PLUS) w/Device KIT 1 kit by Does not apply route as directed. ICD-10 code: E11.9 1 kit 0   diclofenac sodium (VOLTAREN) 1 % GEL Apply 2 g topically 4 (four) times daily. (Patient taking differently: Apply 2 g topically 4 (four) times daily as needed (knee pain).) 100 g 0   ezetimibe (ZETIA) 10 MG tablet Take 10 mg by mouth daily.     fexofenadine (ALLEGRA) 180 MG tablet Take 1 tablet (180 mg total) by mouth daily as needed for allergies. 90 tablet 3   INVOKANA 100 MG TABS tablet TAKE 1 TAB BY MOUTH DAILY BEFORE FIRST MEAL OF DAY FOR FOUR WEEKS. RETURN TO DR IN 3RD OR 4TH WEEK. 90 tablet 3   Lido-Capsaicin-Men-Methyl Sal 0.5-0.035-5-20 % PTCH Apply 1 each topically daily as needed. (Patient not taking: Reported on 05/16/2022) 60 patch 3   Lidocaine (HM LIDOCAINE PATCH) 4 % PTCH Apply 1 each topically daily as needed. (Patient not taking: Reported on 05/16/2022) 15 patch 12   metFORMIN (GLUCOPHAGE) 1000 MG tablet Take 1 tablet (1,000 mg total) by mouth 2 (two) times daily with a meal. Take 1 tablet twice daily with food. 180 tablet 3   metoprolol succinate (TOPROL-XL) 50 MG 24 hr tablet TAKE 1 TABLET BY MOUTH DAILY. TAKE WITH OR IMMEDIATELY FOLLOWING A MEAL. 90 tablet 3   mineral oil-hydrophilic petrolatum (AQUAPHOR) ointment Apply topically as needed for dry skin. 420 g 1   olopatadine (PATADAY) 0.1 % ophthalmic solution Place 1 drop into both eyes 2 (two) times daily. 5 mL 12   potassium chloride (KLOR-CON) 10 MEQ tablet Take 2 tablets (20 mEq total) by mouth 2 (two) times daily. 360 tablet 2   rivaroxaban (XARELTO) 20 MG TABS tablet Take 1 tablet (20 mg total) by mouth daily with supper. With a meal 30 tablet    rosuvastatin (CRESTOR) 40 MG tablet TAKE 1 TABLET BY MOUTH EVERY DAY 90 tablet 1   triamcinolone cream (KENALOG) 0.1 % Apply 1 application. topically 2 (two) times daily. Apply twice a day for two  weeks, then stop. 30 g 0   urea (GORDONS UREA) 40 % ointment Apply topically as needed. (Patient not taking: Reported on 05/16/2022) 30 g 0   No current facility-administered medications on file prior to visit.    Allergies  Allergen Reactions   Lisinopril Anaphylaxis, Shortness Of Breath and Swelling    Mouth and throat became swollen   Doxycycline Swelling    Possibly caused swelling of lips and hives in May 2019, timeline unclear   Flexeril [Cyclobenzaprine Hcl] Itching and Other (See Comments)    Welts also   Lipitor [Atorvastatin Calcium] Other (See Comments)    Myalgia   Metaxalone Itching and Other (See Comments)    Welts also    Social History   Occupational History   Occupation: Airline pilot: [employer]    Comment: 3 days a week  Tobacco Use   Smoking status: Former    Packs/day: 1.00    Years: 5.00    Total pack years: 5.00    Types: Cigarettes    Quit date: 12/09/1978    Years since quitting: 43.7   Smokeless tobacco: Never  Vaping Use   Vaping Use: Never used  Substance and Sexual Activity   Alcohol use: No   Drug use: No   Sexual activity: Not Currently    Birth control/protection: Surgical   Family History  Problem Relation Age of Onset   Diabetes Mother    Kidney disease Mother    Heart disease Mother    Lung cancer Father    Heart disease Father    Stomach cancer Sister    Clotting disorder Other        neice   Diabetes Sister    Diabetes Sister    Diabetes Sister    Diabetes Sister    Colon cancer Neg Hx    Immunization History  Administered Date(s) Administered   PFIZER(Purple Top)SARS-COV-2 Vaccination 04/20/2020, 05/11/2020, 12/22/2020   Td 02/07/1996, 12/11/2007     Review of Systems: Negative except as noted in the HPI.   Objective: There were no vitals filed for this visit.  Meredith [name] is a pleasant 71 y.o. female in NAD. AAO X 3.  Vascular Examination: CFT <3 seconds b/l. DP/PT pulses faintly palpable b/l.  Skin temperature gradient warm to warm b/l. No pain with calf compression. No ischemia or gangrene. No cyanosis or clubbing noted b/l. Pedal hair sparse.   Neurological Examination: Sensation grossly intact b/l with 10 gram monofilament. Vibratory sensation intact b/l.   Dermatological Examination: Pedal skin warm and supple b/l. Toenails 1-5 b/l thick, discolored, elongated with subungual debris and pain on dorsal palpation.  No open wounds b/l LE. No interdigital macerations noted b/l LE. Incurvated nailplate lateral border left hallux and medial border right hallux.  Nail border hypertrophy absent. There is tenderness to palpation. Sign(s) of infection: no clinical signs of infection noted on examination today..  Musculoskeletal Examination: Muscle strength 5/5 to b/l LE. Pes planus deformity noted bilateral LE. Utilizes cane for ambulation assistance.  Radiographs: None  Last A1c:      Latest Ref Rng & Units 11/01/2021    4:59 AM  Hemoglobin A1C  Hemoglobin-A1c 4.8 - 5.6 % 7.2    Footwear Assessment: Does the patient wear appropriate shoes? Yes. Does the patient need inserts/orthotics? Yes.  ADA Risk Categorization: Low Risk :  Patient has all of the following: Intact protective sensation No prior foot ulcer  No severe deformity Pedal pulses present  Assessment: 1. Pain due to onychomycosis of toenails of both feet   2. Callus   3. Diabetes mellitus without complication ([place])   4. Encounter for diabetic foot exam [facility])     Plan: -Patient was evaluated and treated. All patient's and/or POA's questions/concerns answered on today's visit. -Diabetic foot examination performed today. -Stressed the importance of good glycemic control and the detriment of not  controlling glucose levels in relation to the foot. -Patient to continue soft, supportive shoe gear daily. -Mycotic toenails 1-5 bilaterally were debrided in length and girth with sterile nail nippers and dremel without  incident. -Offending nail border debrided and curretaged bilateral great toes utilizing sterile nail nipper and currette. Border cleansed with alcohol and triple antibiotic applied. No further treatment required by patient/caregiver. Call office if there are any concerns. -Patient/POA to call should there be question/concern in the  interim. Return in about 3 months (around 12/14/2022).  Marzetta Board, DPM

## 2022-09-13 NOTE — Patient Instructions (Signed)
Diabetes Mellitus and Foot Care Foot care is an important part of your health, especially when you have diabetes. Diabetes may cause you to have problems because of poor blood flow (circulation) to your feet and legs, which can cause your skin to: Become thinner and drier. Break more easily. Heal more slowly. Peel and crack. You may also have nerve damage (neuropathy) in your legs and feet, causing decreased feeling in them. This means that you may not notice minor injuries to your feet that could lead to more serious problems. Noticing and addressing any potential problems early is the best way to prevent future foot problems. How to care for your feet Foot hygiene Wash your feet daily with warm water and mild soap. Do not use hot water. Then, pat your feet and the areas between your toes until they are completely dry. Do not soak your feet as this can dry your skin. File the edges of your nails with an emery board or nail file. Apply a moisturizing lotion or petroleum jelly to the skin on your feet and to dry, brittle toenails. Use lotion that does not contain alcohol and is unscented. Do not apply lotion between your toes. Shoes and socks Wear clean socks or stockings every day. Make sure they are not too tight. Do not wear knee-high stockings since they may decrease blood flow to your legs. Wear shoes that fit properly and have enough cushioning. Always look in your shoes before you put them on to be sure there are no objects inside. To break in new shoes, wear them for just a few hours a day. This prevents injuries on your feet. Wounds, scrapes, corns, and calluses  Check your feet daily for blisters, cuts, bruises, sores, and redness. If you cannot see the bottom of your feet, use a mirror or ask someone for help. Do not cut corns or calluses or try to remove them with medicine. If you find a minor scrape, cut, or break in the skin on your feet, keep it and the skin around it clean and dry.  You may clean these areas with mild soap and water. Do not clean the area with peroxide, alcohol, or iodine. If you have a wound, scrape, corn, or callus on your foot, look at it several times a day to make sure it is healing and not infected. Check for: Redness, swelling, or pain. Fluid or blood. Warmth. Pus or a bad smell. General tips Do not cross your legs. This may decrease blood flow to your feet. Do not use heating pads or hot water bottles on your feet. They may burn your skin. If you have lost feeling in your feet or legs, you may not know this is happening until it is too late. Protect your feet from hot and cold by wearing shoes, such as at the beach or on hot pavement. Schedule a complete foot exam at least once a year (annually) or more often if you have foot problems. Report any cuts, sores, or bruises to your health care provider immediately. Where to find more information American Diabetes Association: www.diabetes.org Association of Diabetes Care & Education Specialists: www.diabeteseducator.org Contact a health care provider if: You have a medical condition that increases your risk of infection and you have any cuts, sores, or bruises on your feet. You have an injury that is not healing. You have redness on your legs or feet. You feel burning or tingling in your legs or feet. You have pain or cramps in  your legs and feet. Your legs or feet are numb. Your feet always feel cold. You have pain around any toenails. Get help right away if: You have a wound, scrape, corn, or callus on your foot and: You have pain, swelling, or redness that gets worse. You have fluid or blood coming from the wound, scrape, corn, or callus. Your wound, scrape, corn, or callus feels warm to the touch. You have pus or a bad smell coming from the wound, scrape, corn, or callus. You have a fever. You have a red line going up your leg. Summary Check your feet every day for blisters, cuts,  bruises, sores, and redness. Apply a moisturizing lotion or petroleum jelly to the skin on your feet and to dry, brittle toenails. Wear shoes that fit properly and have enough cushioning. If you have foot problems, report any cuts, sores, or bruises to your health care provider immediately. Schedule a complete foot exam at least once a year (annually) or more often if you have foot problems. This information is not intended to replace advice given to you by your health care provider. Make sure you discuss any questions you have with your health care provider. Document Revised: 06/15/2020 Document Reviewed: 06/15/2020 Elsevier Patient Education  2023 [employer].

## 2022-09-19 ENCOUNTER — Ambulatory Visit (INDEPENDENT_AMBULATORY_CARE_PROVIDER_SITE_OTHER): Payer: Medicare PPO | Admitting: Family Medicine

## 2022-09-19 ENCOUNTER — Encounter: Payer: Self-pay | Admitting: Family Medicine

## 2022-09-19 VITALS — BP 136/77 | HR 71 | Ht 62.0 in | Wt 210.8 lb

## 2022-09-19 DIAGNOSIS — Z0000 Encounter for general adult medical examination without abnormal findings: Secondary | ICD-10-CM

## 2022-09-19 DIAGNOSIS — Z1211 Encounter for screening for malignant neoplasm of colon: Secondary | ICD-10-CM | POA: Diagnosis not present

## 2022-09-19 DIAGNOSIS — E119 Type 2 diabetes mellitus without complications: Secondary | ICD-10-CM

## 2022-09-19 DIAGNOSIS — Z1231 Encounter for screening mammogram for malignant neoplasm of breast: Secondary | ICD-10-CM | POA: Diagnosis not present

## 2022-09-19 DIAGNOSIS — Z23 Encounter for immunization: Secondary | ICD-10-CM

## 2022-09-19 HISTORY — DX: Encounter for immunization: Z23

## 2022-09-19 LAB — POCT GLYCOSYLATED HEMOGLOBIN (HGB A1C): HbA1c, POC (controlled diabetic range): 6.5 % (ref 0.0–7.0)

## 2022-09-19 NOTE — Patient Instructions (Addendum)
It was wonderful to see you today. Thank you for allowing me to be a part of your care. Below is a short summary of what we discussed at your visit today:  Diabetes Your diabetes is doing a lot better!  Your A1c today was 6.5.  This means you are now in the prediabetic range.  Keep up the great work with healthy foods and more walking!  Cardiologist Your heart doctor is Dr. Golden Hurter.  Please call her office at [phone] to make an appointment.  I looks like your referral is still active and you do not need a new referral sent.  Dr. Theodosia Blender office to be able to schedule you for your annual heart checkup.  Mammogram I have ordered your routine mammogram to screen for breast cancer. This will be at the [facility]. You will call them directly to make an appointment at your convenience. Information below.       Please bring all of your medications to every appointment!  If you have any questions or concerns, please do not hesitate to contact us via phone or MyChart message.   Ezequiel Essex, MD

## 2022-09-19 NOTE — Assessment & Plan Note (Signed)
Declines referral for colonoscopy, states she did the fecal test through her insurance this past summer.  She does request referral for mammogram.

## 2022-09-19 NOTE — Assessment & Plan Note (Signed)
Declines flu vaccination today.

## 2022-09-19 NOTE — Assessment & Plan Note (Signed)
Improved, A1c 6.5 down from 7.2.  Continue canagliflozin, metformin, rosuvastatin.  We will collect urine microalbumin today.

## 2022-09-19 NOTE — Progress Notes (Signed)
SUBJECTIVE:   CHIEF COMPLAINT / HPI:   ED follow up & HTN Current medicines include amlodipine 10 mg and metoprolol XL 50 mg for hypertension and persistent A-fib.  Patient presented to the emergency room via EMS 9/29 after a dizzy spell at work.  She reports she was simply moving too fast, bent over and got dizzy.  She really did not want to go to the emergency room in the first place, but her coworkers insisted.  ED EKG showed normal sinus rhythm with first-degree block and some PACs, congruent with prior EKGs.  BMP, CBC, and troponins x2 unremarkable.  Magnesium 1.5, repleted prior to discharge from ED.  Patient reports these symptoms have not happened since.  No dizzy episodes at home, even with position change.  She is unsure of her blood pressures at home because her battery in her cuff is out.   Does report 2 falls in the last year.  First was at home and 9/16 when she tripped over a rug, fell face first, and busted her lip.  There was no dizziness or LOC associated with that fall.  The second fall was last year at [employer], where she slipped on some ice on the floor.  T2DM Current regimen invokana (canagliflozin) 100 mg, metformin 1000 mg twice daily, and rosuvastatin 40 mg.  Patient reports her fingerstick sugars are now all <200.  She has made great strides in her diet, avoiding high glycemic foods.  She reports she mainly eats veggies and lean meats now.  Lab Results  Component Value Date   HGBA1C 6.5 09/19/2022   HGBA1C 7.2 (H) 11/01/2021   HGBA1C 7.3 (A) 05/04/2021   Lab Results  Component Value Date   LDLCALC 69 03/14/2022   CREATININE 0.91 09/06/2022   Health maintenance Colon cancer screening: She reports that she did the fecal test through [facility] insurance this past summer, declines colonoscopy.  Mammogram: Requests referral for this  Flu vaccination: Declines today   PERTINENT  PMH / PSH: T2DM, CAD, PAF, HTN, HLD  OBJECTIVE:   BP 136/77   Pulse 71   Ht 5'  2" (1.575 m)   Wt 210 lb 12.8 oz (95.6 kg)   SpO2 99%   BMI 38.56 kg/m    PHQ-9:     09/19/2022    2:28 PM 05/16/2022    8:42 AM 04/11/2022    9:23 AM  Depression screen PHQ 2/9  Decreased Interest  0 2  Down, Depressed, Hopeless  2 1  PHQ - 2 Score  2 3  Altered sleeping 2 1 3   Tired, decreased energy 1 0 1  Change in appetite 2 0   Feeling bad or failure about yourself   0   Trouble concentrating  0   Moving slowly or fidgety/restless 0 2   Suicidal thoughts 0 0   PHQ-9 Score  5 7    Physical Exam General: Awake, alert, oriented Cardiovascular: Regular rate and rhythm, S1 and S2 present, no murmurs auscultated Respiratory: Lung fields clear to auscultation bilaterally  ASSESSMENT/PLAN:   Flu vaccine need Declines flu vaccination today.  Healthcare maintenance Declines referral for colonoscopy, states she did the fecal test through her insurance this past summer.  She does request referral for mammogram.  Diabetes mellitus type II, controlled, with no complications (HCC) Improved, A1c 6.5 down from 7.2.  Continue canagliflozin, metformin, rosuvastatin.  We will collect urine microalbumin today.    Ezequiel Essex, MD [place]

## 2022-09-20 LAB — MICROALBUMIN / CREATININE URINE RATIO
Creatinine, Urine: 59.6 mg/dL
Microalb/Creat Ratio: 19 mg/g creat (ref 0–29)
Microalbumin, Urine: 11.1 ug/mL

## 2022-10-17 ENCOUNTER — Ambulatory Visit: Payer: Medicare PPO | Admitting: Physician Assistant

## 2022-10-19 ENCOUNTER — Other Ambulatory Visit: Payer: Self-pay | Admitting: Cardiology

## 2022-10-19 DIAGNOSIS — I10 Essential (primary) hypertension: Secondary | ICD-10-CM

## 2022-11-06 NOTE — Progress Notes (Unsigned)
Office Visit    Patient Name: Meredith [name] Date of Encounter: 11/07/2022  PCP:  Ezequiel Essex, [facility]  Cardiologist:  Fransico Him, MD  Advanced Practice Provider:  No care team member to display Electrophysiologist:  None   HPI    Meredith [name] is a 71 y.o. female with past medical history significant for atrial tachycardia, persistent AF on Xarelto, nonobstructive AAS CAD with 20% LCx and RCA by cath, hypertension and dyslipidemia presents today for follow-up appointment.  She was last seen September 2022 and was doing well at that time.  Compliant with all medications.  Today, she feels fine and has no complaints. She is still working at [employer] three days a week. She had an episode where she had irregular heart beats at her job and she went to the hospital via ambulance. She forgot to take her metoprolol. Once she took it she felt better. She states she was having some tingling in her chest at one point but no chest pains or SOB. She is compliant with her xarelto without any bleeding issues.   Reports no shortness of breath nor dyspnea on exertion. Reports no chest pain, pressure, or tightness. No edema, orthopnea, PND.   Past Medical History    Past Medical History:  Diagnosis Date   ANEMIA, IRON DEFICIENCY, UNSPEC. 02/05/2007   Qualifier: Diagnosis of  By: Damita Dunnings MD, Phillip Heal     ASTHMA, INTERMITTENT 09/07/2010   Qualifier: Diagnosis of  By: Jess Barters MD, Cindee Salt     CAD (coronary artery disease), native coronary artery 02/16/2015   Cath with 20% LCx and RCA   Colon polyps    CTS (carpal tunnel syndrome) 06/06/2016   Diabetes mellitus    Diverticulosis 05/17/12   DJD (degenerative joint disease) of lumbar spine    Enteritis    Flu vaccine need 09/19/2022   Gastric ulcer 04/2000   Hyperlipidemia    Hypertension    Irregular heart beat    Obesity    Osteoarthritis    Persistent atrial fibrillation (HCC)    CHADS2VASC score is 5 and on  Xarelto   Pruritus 02/21/2021   Rash 02/15/2021   Renal cyst, left 05/17/12   Ventral hernia    Past Surgical History:  Procedure Laterality Date   ABDOMINAL HYSTERECTOMY     LEFT HEART CATHETERIZATION WITH CORONARY ANGIOGRAM N/A 02/16/2015   Procedure: LEFT HEART CATHETERIZATION WITH CORONARY ANGIOGRAM;  Surgeon: Burnell Blanks, MD;  Location: [facility] CATH LAB;  Service: Cardiovascular;  Laterality: N/A;   OPERATIVE HYSTEROSCOPY     TUBAL LIGATION     UTERINE FIBROID SURGERY      Allergies  Allergies  Allergen Reactions   Lisinopril Anaphylaxis, Shortness Of Breath and Swelling    Mouth and throat became swollen   Doxycycline Swelling    Possibly caused swelling of lips and hives in May 2019, timeline unclear   Flexeril [Cyclobenzaprine Hcl] Itching and Other (See Comments)    Welts also   Lipitor [Atorvastatin Calcium] Other (See Comments)    Myalgia   Metaxalone Itching and Other (See Comments)    Welts also     EKGs/Labs/Other Studies Reviewed:   The following studies were reviewed today: Carotid ultrasound 10/2021   Summary:  Right Carotid: The extracranial vessels were near-normal with only minimal  wall                thickening or plaque.   Left Carotid:  The extracranial vessels were near-normal with only minimal  wall               thickening or plaque.   Vertebrals:  Bilateral vertebral arteries demonstrate antegrade flow.  Subclavians: Normal flow hemodynamics were seen in bilateral subclavian               arteries.   *See table(s) above for measurements and observations.      Electronically signed by Orlie Pollen on 11/02/2021 at 10:43:10 AM.     EKG:  EKG is not ordered today.    Recent Labs: 03/14/2022: ALT 12 09/06/2022: BUN 14; Creatinine, Ser 0.91; Hemoglobin 12.2; Magnesium 1.5; Platelets 245; Potassium 3.5; Sodium 143  Recent Lipid Panel    Component Value Date/Time   CHOL 134 03/14/2022 0808   TRIG 81 03/14/2022 0808   HDL 49  03/14/2022 0808   CHOLHDL 2.7 03/14/2022 0808   CHOLHDL 2.3 05/16/2016 0739   VLDL 17 05/16/2016 0739   LDLCALC 69 03/14/2022 0808   LDLDIRECT 130 (H) 05/13/2012 0917    Risk Assessment/Calculations:   CHA2DS2-VASc Score = 4   This indicates a 4.8% annual risk of stroke. The patient's score is based upon: CHF History: 0 HTN History: 1 Diabetes History: 1 Stroke History: 0 Vascular Disease History: 0 Age Score: 1 Gender Score: 1     Home Medications   Current Meds  Medication Sig   ACCU-CHEK AVIVA PLUS test strip USE AS INSTRUCTED TEST BLOOD GLUCOSE ONCE DAILY. ICD-10 CODE: E11.9   Accu-Chek Softclix Lancets lancets Use as instructed to test blood glucose once daily. ICD-10 code: E11.9   acetaminophen (ACETAMINOPHEN 8 HOUR) 650 MG CR tablet Take 1 tablet (650 mg total) by mouth every 8 (eight) hours. (Patient taking differently: Take 650 mg by mouth every 8 (eight) hours as needed for pain.)   amLODipine (NORVASC) 10 MG tablet Take 1 tablet (10 mg total) by mouth daily. Please keep upcoming appt.with Nicholes Rough on 11/30 in order to receive future refills. Thank You.   Bempedoic Acid-Ezetimibe (NEXLIZET) 180-10 MG TABS Take 1 tablet by mouth daily.   Blood Glucose Monitoring Suppl (ACCU-CHEK AVIVA PLUS) w/Device KIT 1 kit by Does not apply route as directed. ICD-10 code: E11.9   diclofenac sodium (VOLTAREN) 1 % GEL Apply 2 g topically 4 (four) times daily. (Patient taking differently: Apply 2 g topically 4 (four) times daily as needed (knee pain).)   ezetimibe (ZETIA) 10 MG tablet Take 10 mg by mouth daily.   fexofenadine (ALLEGRA) 180 MG tablet Take 1 tablet (180 mg total) by mouth daily as needed for allergies.   INVOKANA 100 MG TABS tablet TAKE 1 TAB BY MOUTH DAILY BEFORE FIRST MEAL OF DAY FOR FOUR WEEKS. RETURN TO DR IN 3RD OR 4TH WEEK.   metFORMIN (GLUCOPHAGE) 1000 MG tablet Take 1 tablet (1,000 mg total) by mouth 2 (two) times daily with a meal. Take 1 tablet twice daily  with food.   metoprolol succinate (TOPROL-XL) 50 MG 24 hr tablet TAKE 1 TABLET BY MOUTH DAILY. TAKE WITH OR IMMEDIATELY FOLLOWING A MEAL.   mineral oil-hydrophilic petrolatum (AQUAPHOR) ointment Apply topically as needed for dry skin.   olopatadine (PATADAY) 0.1 % ophthalmic solution Place 1 drop into both eyes 2 (two) times daily.   potassium chloride (KLOR-CON) 10 MEQ tablet Take 2 tablets (20 mEq total) by mouth 2 (two) times daily.   rosuvastatin (CRESTOR) 40 MG tablet TAKE 1 TABLET BY MOUTH EVERY DAY  triamcinolone cream (KENALOG) 0.1 % Apply 1 application. topically 2 (two) times daily. Apply twice a day for two weeks, then stop.   [DISCONTINUED] rivaroxaban (XARELTO) 20 MG TABS tablet Take 1 tablet (20 mg total) by mouth daily with supper. With a meal     Review of Systems      All other systems reviewed and are otherwise negative except as noted above.  Physical Exam    VS:  BP (!) 140/70   Pulse 90   Ht _0  (1.575 m)   Wt 217 lb (98.4 kg)   SpO2 97%   BMI 39.69 kg/m  , BMI Body mass index is 39.69 kg/m.  Wt Readings from Last 3 Encounters:  11/07/22 217 lb (98.4 kg)  09/19/22 210 lb 12.8 oz (95.6 kg)  05/16/22 212 lb (96.2 kg)     GEN: Well nourished, well developed, in no acute distress. HEENT: normal. Neck: Supple, no JVD, carotid bruits, or masses. Cardiac: irregular irregular, no murmurs, rubs, or gallops. No clubbing, cyanosis, edema.  Radials/PT 2+ and equal bilaterally.  Respiratory:  Respirations regular and unlabored, clear to auscultation bilaterally. GI: Soft, nontender, nondistended. MS: No deformity or atrophy. Skin: Warm and dry, no rash. Neuro:  Strength and sensation are intact. Psych: Normal affect.  Assessment & Plan    Persistent atrial fibrillation -continue xarelto, will provide refills today -asymptomatic -in rate controlled afib today  Hypertension -blood pressure well controlled 140/70 -continue current medications: norvasc 55m  daily and metoprolol succinate 526mdaily  CAD -no chest pain -continue GDMT: xarelto 2066maily, crestor 18m71mily, metoprolol sucinate 50mg50mly, norvasc 10mg 35my, and zetia 10mg d20m   HLD -LDL 69 -continue crestor and zetia   Disposition: Follow up 3 months with Traci TFransico Him APP.  Signed, Shantika Bermea NElgie Collard11/30/2023, 2:37 PM [facility]

## 2022-11-07 ENCOUNTER — Encounter: Payer: Self-pay | Admitting: Physician Assistant

## 2022-11-07 ENCOUNTER — Ambulatory Visit: Payer: Medicare PPO | Attending: Physician Assistant | Admitting: Physician Assistant

## 2022-11-07 VITALS — BP 140/70 | HR 90 | Ht 62.0 in | Wt 217.0 lb

## 2022-11-07 DIAGNOSIS — I10 Essential (primary) hypertension: Secondary | ICD-10-CM | POA: Diagnosis not present

## 2022-11-07 DIAGNOSIS — I4819 Other persistent atrial fibrillation: Secondary | ICD-10-CM

## 2022-11-07 DIAGNOSIS — E7800 Pure hypercholesterolemia, unspecified: Secondary | ICD-10-CM

## 2022-11-07 DIAGNOSIS — I2510 Atherosclerotic heart disease of native coronary artery without angina pectoris: Secondary | ICD-10-CM

## 2022-11-07 MED ORDER — RIVAROXABAN 20 MG PO TABS: 20.0000 mg | ORAL_TABLET | Freq: Every day | ORAL | 3 refills | Status: DC

## 2022-11-07 NOTE — Patient Instructions (Signed)
Medication Instructions:  Your physician recommends that you continue on your current medications as directed. Please refer to the Current Medication list given to you today.  *If you need a refill on your cardiac medications before your next appointment, please call your pharmacy*   Lab Work: None If you have labs (blood work) drawn today and your tests are completely normal, you will receive your results only by: MyChart Message (if you have MyChart) OR A paper copy in the mail If you have any lab test that is abnormal or we need to change your treatment, we will call you to review the results.   Follow-Up: At [facility], you and your health needs are our priority.  As part of our continuing mission to provide you with exceptional heart care, we have created designated Provider Care Teams.  These Care Teams include your primary Cardiologist (physician) and Advanced Practice Providers (APPs -  Physician Assistants and Nurse Practitioners) who all work together to provide you with the care you need, when you need it.  We recommend signing up for the patient portal called "MyChart".  Sign up information is provided on this After Visit Summary.  MyChart is used to connect with patients for Virtual Visits (Telemedicine).  Patients are able to view lab/test results, encounter notes, upcoming appointments, etc.  Non-urgent messages can be sent to your provider as well.   To learn more about what you can do with MyChart, go to [email or web address].    Your next appointment:   1 year(s)  The format for your next appointment:   In Person  Provider:   Armanda Magic, MD  or Jari Favre, PA-C        Important Information About Sugar

## 2022-12-05 ENCOUNTER — Ambulatory Visit
Admission: RE | Admit: 2022-12-05 | Discharge: 2022-12-05 | Disposition: A | Discharge status: Home / Self Care | Payer: Medicare PPO | Source: Ambulatory Visit | Attending: Family Medicine | Admitting: Family Medicine

## 2022-12-05 ENCOUNTER — Encounter: Payer: Self-pay | Admitting: Family Medicine

## 2022-12-05 DIAGNOSIS — Z1231 Encounter for screening mammogram for malignant neoplasm of breast: Secondary | ICD-10-CM

## 2022-12-22 ENCOUNTER — Other Ambulatory Visit: Payer: Self-pay | Admitting: Cardiology

## 2022-12-28 ENCOUNTER — Other Ambulatory Visit: Payer: Self-pay | Admitting: Family Medicine

## 2023-01-03 ENCOUNTER — Encounter: Payer: Self-pay | Admitting: Podiatry

## 2023-01-03 ENCOUNTER — Ambulatory Visit: Payer: Medicare PPO | Admitting: Podiatry

## 2023-01-03 VITALS — BP 165/85

## 2023-01-03 DIAGNOSIS — M79675 Pain in left toe(s): Secondary | ICD-10-CM | POA: Diagnosis not present

## 2023-01-03 DIAGNOSIS — B351 Tinea unguium: Secondary | ICD-10-CM

## 2023-01-03 DIAGNOSIS — M79674 Pain in right toe(s): Secondary | ICD-10-CM

## 2023-01-03 DIAGNOSIS — E119 Type 2 diabetes mellitus without complications: Secondary | ICD-10-CM

## 2023-01-03 NOTE — Progress Notes (Unsigned)
  Subjective:  Patient ID: Meredith [name], female    DOB: January 24, 1951,  MRN: [id number]  MARINE LEZOTTE presents to clinic today for {jgcomplaint:23593}  Chief Complaint  Patient presents with   Nail Problem    [facility] BS- did not check A1C-6.? Dorena Dew PCP VST-2023   New problem(s): None. {jgcomplaint:23593}  PCP is Ezequiel Essex, MD.  Allergies  Allergen Reactions   Lisinopril Anaphylaxis, Shortness Of Breath and Swelling    Mouth and throat became swollen   Doxycycline Swelling    Possibly caused swelling of lips and hives in May 2019, timeline unclear   Flexeril [Cyclobenzaprine Hcl] Itching and Other (See Comments)    Welts also   Lipitor [Atorvastatin Calcium] Other (See Comments)    Myalgia   Metaxalone Itching and Other (See Comments)    Welts also     Review of Systems: Negative except as noted in the HPI.  Objective: No changes noted in today's physical examination. Vitals:   01/03/23 0750  BP: (!) 165/85   Meredith [name] is a pleasant 73 y.o. female {jgbodyhabitus:24098} AAO x 3. Vascular Examination: CFT <3 seconds b/l. DP/PT pulses faintly palpable b/l. Skin temperature gradient warm to warm b/l. No pain with calf compression. No ischemia or gangrene. No cyanosis or clubbing noted b/l. Pedal hair sparse.   Neurological Examination: Sensation grossly intact b/l with 10 gram monofilament. Vibratory sensation intact b/l.   Dermatological Examination: Pedal skin warm and supple b/l. Toenails 1-5 b/l thick, discolored, elongated with subungual debris and pain on dorsal palpation.  No open wounds b/l LE. No interdigital macerations noted b/l LE. Incurvated nailplate lateral border left hallux and medial border right hallux.  Nail border hypertrophy absent. There is tenderness to palpation. Sign(s) of infection: no clinical signs of infection noted on examination today.  Musculoskeletal Examination: Muscle strength 5/5 to b/l LE. Pes planus deformity  noted bilateral LE. Utilizes cane for ambulation assistance.  Radiographs: None Assessment/Plan: 1. Pain due to onychomycosis of toenails of both feet   2. Diabetes mellitus without complication ([place])     No orders of the defined types were placed in this encounter.   None {Jgplan:23602::"-Patient/POA to call should there be question/concern in the interim."}   Return in about 3 months (around 04/04/2023).  Marzetta Board, DPM

## 2023-01-20 ENCOUNTER — Other Ambulatory Visit: Payer: Self-pay | Admitting: Cardiology

## 2023-01-20 DIAGNOSIS — I10 Essential (primary) hypertension: Secondary | ICD-10-CM

## 2023-01-24 ENCOUNTER — Other Ambulatory Visit: Payer: Self-pay | Admitting: Family Medicine

## 2023-01-24 DIAGNOSIS — E119 Type 2 diabetes mellitus without complications: Secondary | ICD-10-CM

## 2023-02-13 ENCOUNTER — Other Ambulatory Visit: Payer: Self-pay | Admitting: Cardiology

## 2023-03-26 ENCOUNTER — Other Ambulatory Visit: Payer: Self-pay | Admitting: Family Medicine

## 2023-04-11 ENCOUNTER — Ambulatory Visit (INDEPENDENT_AMBULATORY_CARE_PROVIDER_SITE_OTHER): Payer: Medicare PPO | Admitting: Podiatry

## 2023-04-11 DIAGNOSIS — Z91199 Patient's noncompliance with other medical treatment and regimen due to unspecified reason: Secondary | ICD-10-CM

## 2023-04-11 NOTE — Progress Notes (Signed)
1. No-show for appointment     

## 2023-04-16 ENCOUNTER — Telehealth: Payer: Self-pay | Admitting: Family Medicine

## 2023-04-16 NOTE — Telephone Encounter (Signed)
Called patient to schedule Medicare Annual Wellness Visit (AWV). Left message for patient to call back and schedule Medicare Annual Wellness Visit (AWV).  Last date of AWV: 12/23/2019   Please schedule an AWVS appointment at any time with [facility] VISIT.  If any questions, please contact me at [phone].    Thank you,  [facility] Support [facility] Direct dial  [phone]

## 2023-04-18 ENCOUNTER — Ambulatory Visit: Payer: Medicare PPO | Admitting: Podiatry

## 2023-04-24 ENCOUNTER — Ambulatory Visit: Payer: Medicare PPO | Admitting: Podiatry

## 2023-04-24 ENCOUNTER — Encounter: Payer: Self-pay | Admitting: Podiatry

## 2023-04-24 DIAGNOSIS — M79674 Pain in right toe(s): Secondary | ICD-10-CM

## 2023-04-24 DIAGNOSIS — E119 Type 2 diabetes mellitus without complications: Secondary | ICD-10-CM

## 2023-04-24 DIAGNOSIS — M79675 Pain in left toe(s): Secondary | ICD-10-CM | POA: Diagnosis not present

## 2023-04-24 DIAGNOSIS — B351 Tinea unguium: Secondary | ICD-10-CM

## 2023-04-24 NOTE — Progress Notes (Signed)

## 2023-05-07 DIAGNOSIS — H2513 Age-related nuclear cataract, bilateral: Secondary | ICD-10-CM | POA: Diagnosis not present

## 2023-05-07 DIAGNOSIS — E1136 Type 2 diabetes mellitus with diabetic cataract: Secondary | ICD-10-CM | POA: Diagnosis not present

## 2023-05-07 DIAGNOSIS — H524 Presbyopia: Secondary | ICD-10-CM | POA: Diagnosis not present

## 2023-05-07 DIAGNOSIS — H25013 Cortical age-related cataract, bilateral: Secondary | ICD-10-CM | POA: Diagnosis not present

## 2023-05-09 ENCOUNTER — Other Ambulatory Visit: Payer: Self-pay | Admitting: Cardiology

## 2023-05-09 ENCOUNTER — Telehealth: Payer: Self-pay | Admitting: Cardiology

## 2023-05-09 NOTE — Telephone Encounter (Signed)
Called to try to schedule BMET, no answer. Left message with no identifiers asking patient to call the office.

## 2023-05-09 NOTE — Telephone Encounter (Signed)
Hello patient came in because she needs a refill on her Potassium chloride 10 mg. She stated that she is completely out and would like someone to call her.   Thanks

## 2023-05-29 NOTE — Telephone Encounter (Signed)
Called and left detailed message asking patient to call our office regarding her medications. Letter sent.

## 2023-05-30 LAB — HM DIABETES EYE EXAM

## 2023-07-10 NOTE — Progress Notes (Signed)
Subjective:   Meredith [name] is a 72 y.o. female who presents for Medicare Annual (Subsequent) preventive examination.  Visit Complete: Virtual  I connected with  Meredith [name] on 07/11/23 by a audio enabled telemedicine application and verified that I am speaking with the correct person using two identifiers.  Patient Location: Home  Provider Location: Home Office  I discussed the limitations of evaluation and management by telemedicine. The patient expressed understanding and agreed to proceed.  Vital Signs: Per patient no change in vitals since last visit.  Review of Systems     Cardiac Risk Factors include: advanced age (>46men, >82 women);diabetes mellitus;dyslipidemia;hypertension     Objective:    Today's Vitals   07/11/23 1539  Weight: 217 lb (98.4 kg)  Height: 5\' 2"  (1.575 m)   Body mass index is 39.69 kg/m.     07/11/2023    3:47 PM 09/19/2022    1:43 PM 09/06/2022   12:22 PM 05/16/2022    8:41 AM 04/11/2022    8:46 AM 02/04/2022    9:26 AM 11/22/2021    9:39 AM  Advanced Directives  Does Patient Have a Medical Advance Directive? No No No No No No No  Would patient like information on creating a medical advance directive? Yes (MAU/Ambulatory/Procedural Areas - Information given)  No - Patient declined No - Patient declined No - Patient declined No - Patient declined No - Patient declined    Current Medications (verified) Outpatient Encounter Medications as of 07/11/2023  Medication Sig   ACCU-CHEK AVIVA PLUS test strip USE AS INSTRUCTED TEST BLOOD GLUCOSE ONCE DAILY. ICD-10 CODE: E11.9   Accu-Chek Softclix Lancets lancets Use as instructed to test blood glucose once daily. ICD-10 code: E11.9   acetaminophen (ACETAMINOPHEN 8 HOUR) 650 MG CR tablet Take 1 tablet (650 mg total) by mouth every 8 (eight) hours. (Patient taking differently: Take 650 mg by mouth every 8 (eight) hours as needed for pain.)   amLODipine (NORVASC) 10 MG tablet TAKE 1 TABLET DAILY.KEEP  UPCOMING APPT.WITH TESSA CONTE ON 11/30 IN ORDER TO RECEIVE FUTURE REFILLS.   Bempedoic Acid-Ezetimibe (NEXLIZET) 180-10 MG TABS TAKE 1 TABLET BY MOUTH EVERY DAY   Blood Glucose Monitoring Suppl (ACCU-CHEK AVIVA PLUS) w/Device KIT 1 kit by Does not apply route as directed. ICD-10 code: E11.9   canagliflozin (INVOKANA) 100 MG TABS tablet Take 1 tablet (100 mg total) by mouth daily before breakfast.   diclofenac sodium (VOLTAREN) 1 % GEL Apply 2 g topically 4 (four) times daily. (Patient taking differently: Apply 2 g topically 4 (four) times daily as needed (knee pain).)   ezetimibe (ZETIA) 10 MG tablet Take 10 mg by mouth daily.   fexofenadine (ALLEGRA) 180 MG tablet Take 1 tablet (180 mg total) by mouth daily as needed for allergies.   metFORMIN (GLUCOPHAGE) 1000 MG tablet TAKE 1 TABLET TWICE DAILY WITH FOOD.   metoprolol succinate (TOPROL-XL) 50 MG 24 hr tablet TAKE 1 TABLET BY MOUTH EVERY DAY WITH OR IMMEDIATELY FOLLOWING A MEAL   mineral oil-hydrophilic petrolatum (AQUAPHOR) ointment Apply topically as needed for dry skin.   olopatadine (PATADAY) 0.1 % ophthalmic solution Place 1 drop into both eyes 2 (two) times daily.   potassium chloride (KLOR-CON) 10 MEQ tablet TAKE 2 TABLETS BY MOUTH 2 TIMES DAILY.   rivaroxaban (XARELTO) 20 MG TABS tablet Take 1 tablet (20 mg total) by mouth daily with supper. With a meal   rosuvastatin (CRESTOR) 40 MG tablet TAKE 1 TABLET BY MOUTH EVERY  DAY   triamcinolone cream (KENALOG) 0.1 % Apply 1 application. topically 2 (two) times daily. Apply twice a day for two weeks, then stop.   No facility-administered encounter medications on file as of 07/11/2023.    Allergies (verified) Lisinopril, Doxycycline, Flexeril [cyclobenzaprine hcl], Lipitor [atorvastatin calcium], and Metaxalone   History: Past Medical History:  Diagnosis Date   ANEMIA, IRON DEFICIENCY, UNSPEC. 02/05/2007   Qualifier: Diagnosis of  By: Para March MD, Cheree Ditto     ASTHMA, INTERMITTENT 09/07/2010    Qualifier: Diagnosis of  By: Louanne Belton MD, Erik     CAD (coronary artery disease), native coronary artery 02/16/2015   Cath with 20% LCx and RCA   Colon polyps    CTS (carpal tunnel syndrome) 06/06/2016   Diabetes mellitus    Diverticulosis 05/17/12   DJD (degenerative joint disease) of lumbar spine    Enteritis    Flu vaccine need 09/19/2022   Gastric ulcer 04/2000   Hyperlipidemia    Hypertension    Irregular heart beat    Obesity    Osteoarthritis    Persistent atrial fibrillation (HCC)    CHADS2VASC score is 5 and on Xarelto   Pruritus 02/21/2021   Rash 02/15/2021   Renal cyst, left 05/17/12   Ventral hernia    Past Surgical History:  Procedure Laterality Date   ABDOMINAL HYSTERECTOMY     LEFT HEART CATHETERIZATION WITH CORONARY ANGIOGRAM N/A 02/16/2015   Procedure: LEFT HEART CATHETERIZATION WITH CORONARY ANGIOGRAM;  Surgeon: Kathleene Hazel, MD;  Location: [facility] CATH LAB;  Service: Cardiovascular;  Laterality: N/A;   OPERATIVE HYSTEROSCOPY     TUBAL LIGATION     UTERINE FIBROID SURGERY     Family History  Problem Relation Age of Onset   Diabetes Mother    Kidney disease Mother    Heart disease Mother    Lung cancer Father    Heart disease Father    Stomach cancer Sister    Clotting disorder Other        neice   Diabetes Sister    Diabetes Sister    Diabetes Sister    Diabetes Sister    Colon cancer Neg Hx    Social History   Socioeconomic History   Marital status: Divorced    Spouse name: Not on file   Number of children: 4   Years of education: 11   Highest education level: Not on file  Occupational History   Occupation: COOK    Employer: [employer]    Comment: 3 days a week  Tobacco Use   Smoking status: Former    Current packs/day: 0.00    Average packs/day: 1 pack/day for 5.0 years (5.0 ttl pk-yrs)    Types: Cigarettes    Start date: 12/09/1973    Quit date: 12/09/1978    Years since quitting: 44.6   Smokeless tobacco: Never  Vaping Use    Vaping status: Never Used  Substance and Sexual Activity   Alcohol use: No   Drug use: No   Sexual activity: Not Currently    Birth control/protection: Surgical  Other Topics Concern   Not on file  Social History Narrative   Patient lives alone in [place].   Patient still works 3 days a week at [employer] to keep herself busy.   Patient enjoys walking everyday and speaking with various neighbors.    Patient enjoys watching tv, puzzle books, spending time with her family, and reading her prayers.  Social Determinants of Health   Financial Resource Strain: Low Risk  (07/11/2023)   Overall Financial Resource Strain (CARDIA)    Difficulty of Paying Living Expenses: Not hard at all  Food Insecurity: No Food Insecurity (07/11/2023)   Hunger Vital Sign    Worried About Running Out of Food in the Last Year: Never true    Ran Out of Food in the Last Year: Never true  Transportation Needs: No Transportation Needs (07/11/2023)   PRAPARE - Administrator, Civil Service (Medical): No    Lack of Transportation (Non-Medical): No  Physical Activity: Insufficiently Active (07/11/2023)   Exercise Vital Sign    Days of Exercise per Week: 3 days    Minutes of Exercise per Session: 30 min  Stress: No Stress Concern Present (07/11/2023)   [employer] of Occupational Health - Occupational Stress Questionnaire    Feeling of Stress : Not at all  Social Connections: Moderately Isolated (07/11/2023)   Social Connection and Isolation Panel [NHANES]    Frequency of Communication with Friends and Family: More than three times a week    Frequency of Social Gatherings with Friends and Family: Three times a week    Attends Religious Services: More than 4 times per year    Active Member of Clubs or Organizations: No    Attends Banker Meetings: Never    Marital Status: Divorced    Tobacco  Counseling Counseling given: Not Answered   Clinical Intake:  Pre-visit preparation completed: Yes  Pain : No/denies pain     Diabetes: Yes CBG done?: No  How often do you need to have someone help you when you read instructions, pamphlets, or other written materials from your doctor or pharmacy?: 1 - Never  Interpreter Needed?: No  Information entered by :: Kandis Fantasia LPN   Activities of Daily Living    07/11/2023    3:46 PM  In your present state of health, do you have any difficulty performing the following activities:  Hearing? 0  Vision? 0  Difficulty concentrating or making decisions? 0  Walking or climbing stairs? 0  Dressing or bathing? 0  Doing errands, shopping? 0  Preparing Food and eating ? N  Using the Toilet? N  In the past six months, have you accidently leaked urine? N  Do you have problems with loss of bowel control? N  Managing your Medications? N  Managing your Finances? N  Housekeeping or managing your Housekeeping? N    Patient Care Team: Glendale Chard, DO as PCP - General (Family Medicine) Quintella Reichert, MD as PCP - Cardiology (Cardiology) Jethro Bolus, MD as Consulting Physician (Ophthalmology) Helane Gunther, DPM as Consulting Physician (Podiatry)  Indicate any recent Medical Services you may have received from other than [facility] providers in the past year (date may be approximate).     Assessment:   This is a routine wellness examination for Chandy.  Hearing/Vision screen Hearing Screening - Comments:: Denies hearing difficulties   Vision Screening - Comments:: Wears rx glasses - up to date with routine eye exams with Dr. Nile Riggs    Dietary issues and exercise activities discussed:     Goals Addressed   None   Depression Screen    07/11/2023    3:45 PM 05/16/2022    8:42 AM 04/11/2022    9:23 AM 02/04/2022    9:25 AM 11/12/2021    2:11 PM 08/16/2021   10:20 AM 02/21/2021   11:09 AM  PHQ 2/9  Scores  PHQ - 2 Score 0 2 3 2  0 2 3   PHQ- 9 Score  5 7 5 4  8     Fall Risk    07/11/2023    3:46 PM 09/19/2022    1:43 PM 05/16/2022    8:42 AM 04/11/2022    8:47 AM 02/04/2022    9:25 AM  Fall Risk   Falls in the past year? 0 1 0 1 0  Number falls in past yr: 0 0 0 0 0  Injury with Fall? 0 0 0 1 0  Risk for fall due to : No Fall Risks  History of fall(s)    Follow up Falls prevention discussed;Education provided;Falls evaluation completed Falls evaluation completed       MEDICARE RISK AT HOME:  Medicare Risk at Home - 07/11/23 1546     Any stairs in or around the home? No    If so, are there any without handrails? No    Home free of loose throw rugs in walkways, pet beds, electrical cords, etc? Yes    Adequate lighting in your home to reduce risk of falls? Yes    Life alert? No    Use of a cane, walker or w/c? No    Grab bars in the bathroom? Yes    Shower chair or bench in shower? No    Elevated toilet seat or a handicapped toilet? No             TIMED UP AND GO:  Was the test performed?  No    Cognitive Function:        07/11/2023    3:47 PM 12/23/2019   11:31 AM  6CIT Screen  What Year? 0 points 0 points  What month? 0 points 0 points  What time? 0 points 0 points  Count back from 20 0 points 4 points  Months in reverse 2 points 4 points  Repeat phrase 0 points 0 points  Total Score 2 points 8 points    Immunizations Immunization History  Administered Date(s) Administered   PFIZER(Purple Top)SARS-COV-2 Vaccination 04/20/2020, 05/11/2020, 12/22/2020   Td 02/07/1996, 12/11/2007    TDAP status: Due, Education has been provided regarding the importance of this vaccine. Advised may receive this vaccine at local pharmacy or Health Dept. Aware to provide a copy of the vaccination record if obtained from local pharmacy or Health Dept. Verbalized acceptance and understanding.  Flu Vaccine status: Declined, Education has been provided regarding the importance of this vaccine but patient still  declined. Advised may receive this vaccine at local pharmacy or Health Dept. Aware to provide a copy of the vaccination record if obtained from local pharmacy or Health Dept. Verbalized acceptance and understanding.  Pneumococcal vaccine status: Declined,  Education has been provided regarding the importance of this vaccine but patient still declined. Advised may receive this vaccine at local pharmacy or Health Dept. Aware to provide a copy of the vaccination record if obtained from local pharmacy or Health Dept. Verbalized acceptance and understanding.   Covid-19 vaccine status: Information provided on how to obtain vaccines.   Qualifies for Shingles Vaccine? Yes   Zostavax completed No   Shingrix Completed?: No.    Education has been provided regarding the importance of this vaccine. Patient has been advised to call insurance company to determine out of pocket expense if they have not yet received this vaccine. Advised may also receive vaccine at local pharmacy or Health Dept. Verbalized acceptance and understanding.  Screening Tests Health Maintenance  Topic Date Due   DTaP/Tdap/Td (3 - Tdap) 12/10/2017   Colonoscopy  05/22/2020   COVID-19 Vaccine (4 - 2023-24 season) 08/09/2022   HEMOGLOBIN A1C  03/21/2023   Zoster Vaccines- Shingrix (1 of 2) 10/11/2023 (Originally 03/03/2000)   INFLUENZA VACCINE  03/08/2024 (Originally 07/10/2023)   Pneumonia Vaccine 25+ Years old (1 of 2 - PCV) 07/10/2024 (Originally 03/03/1957)   Diabetic kidney evaluation - eGFR measurement  09/07/2023   FOOT EXAM  09/14/2023   Diabetic kidney evaluation - Urine ACR  09/20/2023   MAMMOGRAM  12/06/2023   OPHTHALMOLOGY EXAM  05/29/2024   Medicare Annual Wellness (AWV)  07/10/2024   DEXA SCAN  Completed   Hepatitis C Screening  Completed   HPV VACCINES  Aged Out    Health Maintenance  Health Maintenance Due  Topic Date Due   DTaP/Tdap/Td (3 - Tdap) 12/10/2017   Colonoscopy  05/22/2020   COVID-19 Vaccine (4 -  2023-24 season) 08/09/2022   HEMOGLOBIN A1C  03/21/2023    Colorectal cancer screening:  Patient declines and states that she completes a yearly test with her insurance  Mammogram status: Completed 12/05/22. Repeat every year  Bone Density status: Completed 09/04/17. Results reflect: Bone density results: OSTEOPENIA. Repeat every 2 years.  Lung Cancer Screening: (Low Dose CT Chest recommended if Age 23-80 years, 20 pack-year currently smoking OR have quit w/in 15years.) does not qualify.   Lung Cancer Screening Referral: n/a  Additional Screening:  Hepatitis C Screening: does qualify; Completed 07/19/16  Vision Screening: Recommended annual ophthalmology exams for early detection of glaucoma and other disorders of the eye. Is the patient up to date with their annual eye exam?  Yes  Who is the provider or what is the name of the office in which the patient attends annual eye exams? Dr. Nile Riggs  If pt is not established with a provider, would they like to be referred to a provider to establish care? No .   Dental Screening: Recommended annual dental exams for proper oral hygiene  Diabetic Foot Exam: Diabetic Foot Exam: Completed 09/13/22  Community Resource Referral / Chronic Care Management: CRR required this visit?  No   CCM required this visit?  No     Plan:     I have personally reviewed and noted the following in the patient's chart:   Medical and social history Use of alcohol, tobacco or illicit drugs  Current medications and supplements including opioid prescriptions. Patient is not currently taking opioid prescriptions. Functional ability and status Nutritional status Physical activity Advanced directives List of other physicians Hospitalizations, surgeries, and ER visits in previous 12 months Vitals Screenings to include cognitive, depression, and falls Referrals and appointments  In addition, I have reviewed and discussed with patient certain preventive  protocols, quality metrics, and best practice recommendations. A written personalized care plan for preventive services as well as general preventive health recommendations were provided to patient.     Kandis Fantasia [place], [place]   4/0/3474   After Visit Summary: (Mail) Due to this being a telephonic visit, the after visit summary with patients personalized plan was offered to patient via mail   Nurse Notes: No concerns at this time

## 2023-07-10 NOTE — Patient Instructions (Addendum)
Meredith [name] , Thank you for taking time to come for your Medicare Wellness Visit. I appreciate your ongoing commitment to your health goals. Please review the following plan we discussed and let me know if I can assist you in the future.   Referrals/Orders/Follow-Ups/Clinician Recommendations: Aim for 30 minutes of exercise or brisk walking, 6-8 glasses of water, and 5 servings of fruits and vegetables each day.   This is a list of the screening recommended for you and due dates:  Health Maintenance  Topic Date Due   DTaP/Tdap/Td vaccine (3 - Tdap) 12/10/2017   Colon Cancer Screening  05/22/2020   COVID-19 Vaccine (4 - 2023-24 season) 08/09/2022   Hemoglobin A1C  03/21/2023   Zoster (Shingles) Vaccine (1 of 2) 10/11/2023*   Flu Shot  03/08/2024*   Pneumonia Vaccine (1 of 2 - PCV) 07/10/2024*   Yearly kidney function blood test for diabetes  09/07/2023   Complete foot exam   09/14/2023   Yearly kidney health urinalysis for diabetes  09/20/2023   Mammogram  12/06/2023   Eye exam for diabetics  05/29/2024   Medicare Annual Wellness Visit  07/10/2024   DEXA scan (bone density measurement)  Completed   Hepatitis C Screening  Completed   HPV Vaccine  Aged Out  *Topic was postponed. The date shown is not the original due date.    Advanced directives: (ACP Link)Information on Advanced Care Planning can be found at [facility] Advance Health Care Directives Advance Health Care Directives ([email or web address]   Next Medicare Annual Wellness Visit scheduled for next year: Yes  Preventive Care 65 Years and Older, Female Preventive care refers to lifestyle choices and visits with your health care provider that can promote health and wellness. What does preventive care include? A yearly physical exam. This is also called an annual well check. Dental exams once or twice a year. Routine eye exams. Ask your health care provider how often you should have your eyes checked. Personal  lifestyle choices, including: Daily care of your teeth and gums. Regular physical activity. Eating a healthy diet. Avoiding tobacco and drug use. Limiting alcohol use. Practicing safe sex. Taking low-dose aspirin every day. Taking vitamin and mineral supplements as recommended by your health care provider. What happens during an annual well check? The services and screenings done by your health care provider during your annual well check will depend on your age, overall health, lifestyle risk factors, and family history of disease. Counseling  Your health care provider may ask you questions about your: Alcohol use. Tobacco use. Drug use. Emotional well-being. Home and relationship well-being. Sexual activity. Eating habits. History of falls. Memory and ability to understand (cognition). Work and work Astronomer. Reproductive health. Screening  You may have the following tests or measurements: Height, weight, and BMI. Blood pressure. Lipid and cholesterol levels. These may be checked every 5 years, or more frequently if you are over 39 years old. Skin check. Lung cancer screening. You may have this screening every year starting at age 60 if you have a 30-pack-year history of smoking and currently smoke or have quit within the past 15 years. Fecal occult blood test (FOBT) of the stool. You may have this test every year starting at age 21. Flexible sigmoidoscopy or colonoscopy. You may have a sigmoidoscopy every 5 years or a colonoscopy every 10 years starting at age 69. Hepatitis C blood test. Hepatitis B blood test. Sexually transmitted disease (STD) testing. Diabetes screening. This is done by checking  your blood sugar (glucose) after you have not eaten for a while (fasting). You may have this done every 1-3 years. Bone density scan. This is done to screen for osteoporosis. You may have this done starting at age 88. Mammogram. This may be done every 1-2 years. Talk to your  health care provider about how often you should have regular mammograms. Talk with your health care provider about your test results, treatment options, and if necessary, the need for more tests. Vaccines  Your health care provider may recommend certain vaccines, such as: Influenza vaccine. This is recommended every year. Tetanus, diphtheria, and acellular pertussis (Tdap, Td) vaccine. You may need a Td booster every 10 years. Zoster vaccine. You may need this after age 53. Pneumococcal 13-valent conjugate (PCV13) vaccine. One dose is recommended after age 8. Pneumococcal polysaccharide (PPSV23) vaccine. One dose is recommended after age 95. Talk to your health care provider about which screenings and vaccines you need and how often you need them. This information is not intended to replace advice given to you by your health care provider. Make sure you discuss any questions you have with your health care provider. Document Released: 12/22/2015 Document Revised: 08/14/2016 Document Reviewed: 09/26/2015 Elsevier Interactive Patient Education  2017 [employer].  Fall Prevention in the Home Falls can cause injuries. They can happen to people of all ages. There are many things you can do to make your home safe and to help prevent falls. What can I do on the outside of my home? Regularly fix the edges of walkways and driveways and fix any cracks. Remove anything that might make you trip as you walk through a door, such as a raised step or threshold. Trim any bushes or trees on the path to your home. Use bright outdoor lighting. Clear any walking paths of anything that might make someone trip, such as rocks or tools. Regularly check to see if handrails are loose or broken. Make sure that both sides of any steps have handrails. Any raised decks and porches should have guardrails on the edges. Have any leaves, snow, or ice cleared regularly. Use sand or salt on walking paths during winter. Clean  up any spills in your garage right away. This includes oil or grease spills. What can I do in the bathroom? Use night lights. Install grab bars by the toilet and in the tub and shower. Do not use towel bars as grab bars. Use non-skid mats or decals in the tub or shower. If you need to sit down in the shower, use a plastic, non-slip stool. Keep the floor dry. Clean up any water that spills on the floor as soon as it happens. Remove soap buildup in the tub or shower regularly. Attach bath mats securely with double-sided non-slip rug tape. Do not have throw rugs and other things on the floor that can make you trip. What can I do in the bedroom? Use night lights. Make sure that you have a light by your bed that is easy to reach. Do not use any sheets or blankets that are too big for your bed. They should not hang down onto the floor. Have a firm chair that has side arms. You can use this for support while you get dressed. Do not have throw rugs and other things on the floor that can make you trip. What can I do in the kitchen? Clean up any spills right away. Avoid walking on wet floors. Keep items that you use a lot  in easy-to-reach places. If you need to reach something above you, use a strong step stool that has a grab bar. Keep electrical cords out of the way. Do not use floor polish or wax that makes floors slippery. If you must use wax, use non-skid floor wax. Do not have throw rugs and other things on the floor that can make you trip. What can I do with my stairs? Do not leave any items on the stairs. Make sure that there are handrails on both sides of the stairs and use them. Fix handrails that are broken or loose. Make sure that handrails are as long as the stairways. Check any carpeting to make sure that it is firmly attached to the stairs. Fix any carpet that is loose or worn. Avoid having throw rugs at the top or bottom of the stairs. If you do have throw rugs, attach them to the  floor with carpet tape. Make sure that you have a light switch at the top of the stairs and the bottom of the stairs. If you do not have them, ask someone to add them for you. What else can I do to help prevent falls? Wear shoes that: Do not have high heels. Have rubber bottoms. Are comfortable and fit you well. Are closed at the toe. Do not wear sandals. If you use a stepladder: Make sure that it is fully opened. Do not climb a closed stepladder. Make sure that both sides of the stepladder are locked into place. Ask someone to hold it for you, if possible. Clearly mark and make sure that you can see: Any grab bars or handrails. First and last steps. Where the edge of each step is. Use tools that help you move around (mobility aids) if they are needed. These include: Canes. Walkers. Scooters. Crutches. Turn on the lights when you go into a dark area. Replace any light bulbs as soon as they burn out. Set up your furniture so you have a clear path. Avoid moving your furniture around. If any of your floors are uneven, fix them. If there are any pets around you, be aware of where they are. Review your medicines with your doctor. Some medicines can make you feel dizzy. This can increase your chance of falling. Ask your doctor what other things that you can do to help prevent falls. This information is not intended to replace advice given to you by your health care provider. Make sure you discuss any questions you have with your health care provider. Document Released: 09/21/2009 Document Revised: 05/02/2016 Document Reviewed: 12/30/2014 Elsevier Interactive Patient Education  2017 [employer].

## 2023-07-11 ENCOUNTER — Ambulatory Visit (INDEPENDENT_AMBULATORY_CARE_PROVIDER_SITE_OTHER): Payer: Medicare PPO

## 2023-07-11 VITALS — Ht 62.0 in | Wt 217.0 lb

## 2023-07-11 DIAGNOSIS — Z0000 Encounter for general adult medical examination without abnormal findings: Secondary | ICD-10-CM | POA: Diagnosis not present

## 2023-07-11 DIAGNOSIS — E119 Type 2 diabetes mellitus without complications: Secondary | ICD-10-CM

## 2023-07-11 MED ORDER — ACCU-CHEK AVIVA PLUS VI STRP: ORAL_STRIP | 12 refills | Status: DC

## 2023-07-17 ENCOUNTER — Ambulatory Visit (INDEPENDENT_AMBULATORY_CARE_PROVIDER_SITE_OTHER): Payer: Medicare PPO | Admitting: Podiatry

## 2023-07-17 ENCOUNTER — Encounter: Payer: Self-pay | Admitting: Podiatry

## 2023-07-17 DIAGNOSIS — E119 Type 2 diabetes mellitus without complications: Secondary | ICD-10-CM | POA: Diagnosis not present

## 2023-07-17 DIAGNOSIS — B351 Tinea unguium: Secondary | ICD-10-CM | POA: Diagnosis not present

## 2023-07-17 DIAGNOSIS — M79674 Pain in right toe(s): Secondary | ICD-10-CM | POA: Diagnosis not present

## 2023-07-17 DIAGNOSIS — M79675 Pain in left toe(s): Secondary | ICD-10-CM

## 2023-07-17 NOTE — Progress Notes (Signed)

## 2023-09-01 ENCOUNTER — Telehealth: Payer: Self-pay | Admitting: Student

## 2023-09-01 NOTE — Telephone Encounter (Signed)
Reviewed form and placed in PCP's bo for completion.  Glennie Hawk, CMA

## 2023-09-01 NOTE — Telephone Encounter (Signed)
patient dropped off form at front desk for [facility].  Verified that patient section of form has been completed.  Last DOS/WCC with PCP was 09/19/22   Placed form in green team folder to be completed by clinical staff.  Vilinda Blanks

## 2023-09-05 NOTE — Telephone Encounter (Signed)
Form faxed to provided number. Copy made and placed in batch scanning.   Talbot Grumbling, RN

## 2023-10-13 ENCOUNTER — Other Ambulatory Visit: Payer: Self-pay | Admitting: Cardiology

## 2023-10-13 DIAGNOSIS — I10 Essential (primary) hypertension: Secondary | ICD-10-CM

## 2023-10-17 ENCOUNTER — Ambulatory Visit: Payer: Medicare HMO | Admitting: Podiatry

## 2023-10-27 ENCOUNTER — Other Ambulatory Visit: Payer: Self-pay | Admitting: Cardiology

## 2023-10-29 ENCOUNTER — Other Ambulatory Visit: Payer: Self-pay | Admitting: Physician Assistant

## 2023-10-29 DIAGNOSIS — I4819 Other persistent atrial fibrillation: Secondary | ICD-10-CM

## 2023-10-29 NOTE — Telephone Encounter (Signed)
Xarelto refill

## 2023-10-29 NOTE — Telephone Encounter (Signed)
Pt has scheduled appt on 02/13/24 with Dr Mayford Knife. Note placed on appt for labs to be drawn at this appt.

## 2023-10-29 NOTE — Telephone Encounter (Signed)
Meredith [name] last saw Meredith [name], [name] on 11/07/22, Meredith [name] is due for follow-up. Message sent to schedulers to contact Meredith [name] for follow-up appt, recall in Epic.  Last labs 09/06/22 Creat 0.91, Meredith [name] is also overdue for labwork.  Will place note on appt to check CBC and BMP at OV.  Age 72, weight 98.4kg, CrCl 85.53, based on CrCl Meredith [name] is on appropriate dosage of Xarelto 20mg  every day for afib.  Will send in 1 refill with note to Meredith [name] overdue for follow-up and labwork as well. Will await appt to be made so we can place note for labwork on upcoming appt.

## 2023-10-30 ENCOUNTER — Encounter: Payer: Self-pay | Admitting: Podiatry

## 2023-10-30 ENCOUNTER — Ambulatory Visit (INDEPENDENT_AMBULATORY_CARE_PROVIDER_SITE_OTHER): Payer: Medicare HMO | Admitting: Podiatry

## 2023-10-30 VITALS — Ht 62.0 in | Wt 217.0 lb

## 2023-10-30 DIAGNOSIS — M79675 Pain in left toe(s): Secondary | ICD-10-CM

## 2023-10-30 DIAGNOSIS — B351 Tinea unguium: Secondary | ICD-10-CM

## 2023-10-30 DIAGNOSIS — M79674 Pain in right toe(s): Secondary | ICD-10-CM | POA: Diagnosis not present

## 2023-10-30 DIAGNOSIS — E119 Type 2 diabetes mellitus without complications: Secondary | ICD-10-CM

## 2023-10-30 NOTE — Progress Notes (Signed)
This patient returns to my office for at risk foot care.  This patient requires this care by a professional since this patient will be at risk due to having diabetes.    This patient is unable to cut nails herself since the patient cannot reach her nails.These nails are painful walking and wearing shoes.  This patient presents for at risk foot care today.  General Appearance  Alert, conversant and in no acute stress.  Vascular  Dorsalis pedis and posterior tibial  pulses are palpable  bilaterally.  Capillary return is within normal limits  bilaterally. Temperature is within normal limits  bilaterally.  Neurologic  Senn-Weinstein monofilament wire test within normal limits  bilaterally. Muscle power within normal limits bilaterally.  Nails Thick disfigured discolored nails with subungual debris  from hallux to fifth toes bilaterally. No evidence of bacterial infection or drainage bilaterally.  Orthopedic  No limitations of motion  feet .  No crepitus or effusions noted.  No bony pathology or digital deformities noted.  Skin  normotropic skin with no porokeratosis noted bilaterally.  No signs of infections or ulcers noted.     Onychomycosis  Pain in right toes  Pain in left toes  Consent was obtained for treatment procedures.   Mechanical debridement of nails 1-5  bilaterally performed with a nail nipper.  Filed with dremel without incident.    Return office visit    3 months                  Told patient to return for periodic foot care and evaluation due to potential at risk complications.   Jayleigh Notarianni DPM   

## 2023-10-31 ENCOUNTER — Ambulatory Visit (INDEPENDENT_AMBULATORY_CARE_PROVIDER_SITE_OTHER): Payer: Medicare HMO | Admitting: Family Medicine

## 2023-10-31 ENCOUNTER — Encounter: Payer: Self-pay | Admitting: Family Medicine

## 2023-10-31 VITALS — BP 141/75 | HR 74 | Ht 63.0 in | Wt 217.2 lb

## 2023-10-31 DIAGNOSIS — E119 Type 2 diabetes mellitus without complications: Secondary | ICD-10-CM

## 2023-10-31 DIAGNOSIS — M65332 Trigger finger, left middle finger: Secondary | ICD-10-CM | POA: Diagnosis not present

## 2023-10-31 DIAGNOSIS — G5601 Carpal tunnel syndrome, right upper limb: Secondary | ICD-10-CM

## 2023-10-31 DIAGNOSIS — I10 Essential (primary) hypertension: Secondary | ICD-10-CM | POA: Diagnosis not present

## 2023-10-31 DIAGNOSIS — M6530 Trigger finger, unspecified finger: Secondary | ICD-10-CM | POA: Insufficient documentation

## 2023-10-31 LAB — POCT GLYCOSYLATED HEMOGLOBIN (HGB A1C): HbA1c, POC (controlled diabetic range): 6.7 % (ref 0.0–7.0)

## 2023-10-31 MED ORDER — ACCU-CHEK AVIVA PLUS VI STRP: ORAL_STRIP | 12 refills | Status: DC

## 2023-10-31 NOTE — Assessment & Plan Note (Signed)
Left third finger.  Patient is not interested in surgical intervention at this time.

## 2023-10-31 NOTE — Progress Notes (Signed)
A1c 6.7, reviewed with patient in clinic during appointment today. She is doing well and should continue her metformin and canagliflozin.

## 2023-10-31 NOTE — Patient Instructions (Signed)
Dear Meredith [name]  Today we discussed the following concerns and plans:  Diabetes: - Your A1c today is 6.7. Keep up the good work! - I sent in test strips to your pharmacy, if these do not get mailed to your house please let our clinic know.  Hand discomfort: - I suspect you have carpal tunnel in your right hand.  Try a wrist brace at night for your right hand. -I suspect you have trigger finger in your left hand.  If you want to consider surgery in the future please let our office know.   If you have any concerns, please call the clinic or schedule an appointment.  It was a pleasure to take care of you today. Be well!  Cyndia Skeeters, DO [facility], PGY-1

## 2023-10-31 NOTE — Assessment & Plan Note (Addendum)
A1c today 6.7.  Diabetes well-controlled. - Refilled test trips today -Continue metformin 1000 mg 2 times daily and canagliflozin 100 mg daily

## 2023-10-31 NOTE — Progress Notes (Cosign Needed)
    SUBJECTIVE:   CHIEF COMPLAINT / HPI:   Diabetes Patient has been eating good diet and trying to stay active.  She has not been able to check her blood sugar at home as she has run out of test strips; request refill of these today.  She is concerned her A1c will have increased.  L hand trigger finger (3rd finger) This has been going on for a long time.  Affects her at work.  Her finger gets stuck in a closed position and she has to use her other hand to pull back into position.  Painful.  She does wear gloves at night while sleeping to try to keep her finger in position.  R hand numbness, tingling This is started in the last few months.  Worst in thumb and first 2 fingers.  Some tingling to palm of hand worse when she is working and using her hands a lot.  No numbness or tingling to other extremities.  PERTINENT  PMH / PSH:  Past Medical History:  Diagnosis Date   ANEMIA, IRON DEFICIENCY, UNSPEC. 02/05/2007   Qualifier: Diagnosis of  By: Para March MD, Cheree Ditto     ASTHMA, INTERMITTENT 09/07/2010   Qualifier: Diagnosis of  By: Louanne Belton MD, Rolm Gala     CAD (coronary artery disease), native coronary artery 02/16/2015   Cath with 20% LCx and RCA   Colon polyps    CTS (carpal tunnel syndrome) 06/06/2016   Diabetes mellitus    Diverticulosis 05/17/12   DJD (degenerative joint disease) of lumbar spine    Enteritis    Flu vaccine need 09/19/2022   Gastric ulcer 04/2000   Hyperlipidemia    Hypertension    Irregular heart beat    Obesity    Osteoarthritis    Persistent atrial fibrillation (HCC)    CHADS2VASC score is 5 and on Xarelto   Pruritus 02/21/2021   Rash 02/15/2021   Renal cyst, left 05/17/12   Ventral hernia      OBJECTIVE:   BP (!) 141/75   Pulse 74   Ht 5\' 3"  (1.6 m)   Wt 217 lb 3.2 oz (98.5 kg)   SpO2 100%   BMI 38.48 kg/m   General: Well-appearing, pleasant, no acute distress. Cardio: Irregular rate and rhythm, no murmurs on exam. Pulm: Clear, no wheezing, no crackles. No  increased work of breathing. Extremities: Moves all extremities equally.  Negative Tinel's test on right hand. Neuro: Alert and oriented x3, speech normal in content, no facial asymmetry.  Strength intact in bilateral upper extremities 5/5. Psych:  Cognition and judgment appear intact. Alert, communicative, and cooperative with normal attention span and concentration.   ASSESSMENT/PLAN:   Diabetes mellitus type II, controlled, with no complications (HCC) A1c today 6.7.  Diabetes well-controlled. - Refilled test trips today -Continue metformin 1000 mg 2 times daily and canagliflozin 100 mg daily  Trigger finger of left hand Left third finger.  Patient is not interested in surgical intervention at this time.  Carpal tunnel syndrome Patient did have negative Tinel's today however history and description of symptoms are compelling for carpal tunnel. - Wrist brace to be worn at night to help with symptoms.  Discussed with patient that she can buy this at her pharmacy.     Cyndia Skeeters, DO [facility]

## 2023-10-31 NOTE — Assessment & Plan Note (Signed)
Patient did have negative Tinel's today however history and description of symptoms are compelling for carpal tunnel. - Wrist brace to be worn at night to help with symptoms.  Discussed with patient that Meredith [name] can buy this at her pharmacy.

## 2023-11-01 DIAGNOSIS — I129 Hypertensive chronic kidney disease with stage 1 through stage 4 chronic kidney disease, or unspecified chronic kidney disease: Secondary | ICD-10-CM | POA: Diagnosis not present

## 2023-11-01 DIAGNOSIS — E1122 Type 2 diabetes mellitus with diabetic chronic kidney disease: Secondary | ICD-10-CM | POA: Diagnosis not present

## 2023-11-01 DIAGNOSIS — I951 Orthostatic hypotension: Secondary | ICD-10-CM | POA: Diagnosis not present

## 2023-11-01 DIAGNOSIS — Z7901 Long term (current) use of anticoagulants: Secondary | ICD-10-CM | POA: Diagnosis not present

## 2023-11-01 DIAGNOSIS — K219 Gastro-esophageal reflux disease without esophagitis: Secondary | ICD-10-CM | POA: Diagnosis not present

## 2023-11-01 DIAGNOSIS — Z6838 Body mass index (BMI) 38.0-38.9, adult: Secondary | ICD-10-CM | POA: Diagnosis not present

## 2023-11-01 DIAGNOSIS — Z833 Family history of diabetes mellitus: Secondary | ICD-10-CM | POA: Diagnosis not present

## 2023-11-01 DIAGNOSIS — J45909 Unspecified asthma, uncomplicated: Secondary | ICD-10-CM | POA: Diagnosis not present

## 2023-11-01 DIAGNOSIS — Z8249 Family history of ischemic heart disease and other diseases of the circulatory system: Secondary | ICD-10-CM | POA: Diagnosis not present

## 2023-11-01 DIAGNOSIS — F329 Major depressive disorder, single episode, unspecified: Secondary | ICD-10-CM | POA: Diagnosis not present

## 2023-11-01 DIAGNOSIS — F4310 Post-traumatic stress disorder, unspecified: Secondary | ICD-10-CM | POA: Diagnosis not present

## 2023-11-01 DIAGNOSIS — Z7984 Long term (current) use of oral hypoglycemic drugs: Secondary | ICD-10-CM | POA: Diagnosis not present

## 2023-11-01 DIAGNOSIS — E785 Hyperlipidemia, unspecified: Secondary | ICD-10-CM | POA: Diagnosis not present

## 2023-11-01 DIAGNOSIS — N189 Chronic kidney disease, unspecified: Secondary | ICD-10-CM | POA: Diagnosis not present

## 2023-11-01 DIAGNOSIS — Z87892 Personal history of anaphylaxis: Secondary | ICD-10-CM | POA: Diagnosis not present

## 2023-11-01 DIAGNOSIS — Z91038 Other insect allergy status: Secondary | ICD-10-CM | POA: Diagnosis not present

## 2023-11-01 DIAGNOSIS — I2510 Atherosclerotic heart disease of native coronary artery without angina pectoris: Secondary | ICD-10-CM | POA: Diagnosis not present

## 2023-11-10 ENCOUNTER — Other Ambulatory Visit: Payer: Self-pay | Admitting: Student

## 2023-11-10 DIAGNOSIS — Z1231 Encounter for screening mammogram for malignant neoplasm of breast: Secondary | ICD-10-CM

## 2023-11-18 ENCOUNTER — Other Ambulatory Visit: Payer: Self-pay | Admitting: Cardiology

## 2023-11-18 DIAGNOSIS — I10 Essential (primary) hypertension: Secondary | ICD-10-CM

## 2023-12-11 ENCOUNTER — Ambulatory Visit
Admission: RE | Admit: 2023-12-11 | Discharge: 2023-12-11 | Disposition: A | Discharge status: Home / Self Care | Payer: Medicare HMO | Source: Ambulatory Visit

## 2023-12-11 DIAGNOSIS — Z1231 Encounter for screening mammogram for malignant neoplasm of breast: Secondary | ICD-10-CM

## 2023-12-28 ENCOUNTER — Other Ambulatory Visit: Payer: Self-pay | Admitting: Family Medicine

## 2023-12-29 ENCOUNTER — Other Ambulatory Visit: Payer: Self-pay | Admitting: Student

## 2023-12-29 ENCOUNTER — Telehealth: Payer: Self-pay

## 2023-12-29 NOTE — Telephone Encounter (Signed)
Patient calls nurse line reporting low back pain.   She reports the pain started ~ 3 weeks ago. She reports her average pain level is "about" a 5. She reports relief when she is sitting down, however she still works and moves around all day.   She denies any recents injuries or falls. She does report a fall ~ 1 year ago. She denies any UTI symptoms. No dysuria, hematuria, fevers or chills.   She is requesting something for pain relief. Patient advised an apt would be recommended giving the duration of symptoms.   Patient scheduled for 1/23 for evaluation with Dimitry.   Patient advised to take Tylenol and to use a heating pad for discomfort.

## 2023-12-30 ENCOUNTER — Other Ambulatory Visit: Payer: Self-pay | Admitting: Cardiology

## 2023-12-30 DIAGNOSIS — I4819 Other persistent atrial fibrillation: Secondary | ICD-10-CM

## 2023-12-30 DIAGNOSIS — I10 Essential (primary) hypertension: Secondary | ICD-10-CM

## 2024-01-01 ENCOUNTER — Encounter: Payer: Self-pay | Admitting: Family Medicine

## 2024-01-01 ENCOUNTER — Ambulatory Visit (INDEPENDENT_AMBULATORY_CARE_PROVIDER_SITE_OTHER): Payer: Medicare HMO | Admitting: Family Medicine

## 2024-01-01 VITALS — BP 139/72 | HR 75 | Wt 218.2 lb

## 2024-01-01 DIAGNOSIS — M545 Low back pain, unspecified: Secondary | ICD-10-CM

## 2024-01-01 DIAGNOSIS — Z7984 Long term (current) use of oral hypoglycemic drugs: Secondary | ICD-10-CM | POA: Diagnosis not present

## 2024-01-01 DIAGNOSIS — E119 Type 2 diabetes mellitus without complications: Secondary | ICD-10-CM

## 2024-01-01 DIAGNOSIS — G8929 Other chronic pain: Secondary | ICD-10-CM

## 2024-01-01 NOTE — Telephone Encounter (Signed)
Prescription refill request for Xarelto received.  Indication: Afib  Last office visit: 11/07/22 Asa Lente)  Weight: 98.5kg Age: 73 Scr: 0.91 (09/06/22)  CrCl: 86.35ml/min  Pt has scheduled appt with Dr Mayford Knife on 02/13/24. Note placed on appt for labs to be drawn at this appt. Refill sent.

## 2024-01-01 NOTE — Patient Instructions (Addendum)
It was great to see you today! Thank you for choosing [facility] for your primary care.  Today we addressed:  Back pain This pain is most likely due to arthritis or musculoskeletal pain.  You can take your Tylenol up to 3000 mg daily, which means that you can do 4 of the 650 mg tablets a day, or 1 every 6 hours.  You can use Voltaren gel on your back as well.  Finally, try the over the counter Lidocaine patches, available at most pharmacies cheaply.  You can wear those for 12 hours daily, just stick one on and leave it.  Please come back if your back pain is not improving within a 2-4 weeks, or if you experience incontinence, weakness in your legs, or significant leg numbness.  We are checking some labs today, including diabetic kidney labs.  You will get a MyChart message or a letter if results are normal. Otherwise, you will get a call from [place].  Thank you for coming to see [place] at [facility] and for the opportunity to care for you! Mishal Probert, MD 01/01/2024, 2:41 PM

## 2024-01-01 NOTE — Assessment & Plan Note (Addendum)
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  Patient has excellent ROM of hips and back, but mild pain with spinal extension.  No red flag symptoms for malignancy, sciatic impingement, or cauda equina syndrome on history.  Physical exam unremarkable.  XR not indicated at this time, can consider next visit if not improving.  Unfortunately, poor candidate for oral NSAIDs given CAD, GERD, and age. - Multimodal pain therapy: Lidocaine patch OTC 12 hours daily, Voltaren gel QID daily (already has tube), and increase acetaminophen to 650 mg QID or Q6h for next 2-4 weeks - Declines muscle relaxer and PT today, reconsider next appointment if pain not improving - Follow up 2-4 weeks if not improving or worsening - Return precautions per AVS

## 2024-01-01 NOTE — Assessment & Plan Note (Addendum)
In October, A1c at goal <7.5% for older adult. - Urine Microalbumin/Cr ratio today - Continue metformin 1000 mg BID and cangliflozin 100 mg daily - Defer foot exam and further management to PCP

## 2024-01-01 NOTE — Progress Notes (Signed)
   SUBJECTIVE:   CHIEF COMPLAINT / HPI:  Meredith [name] is a 73 y.o. female with a pertinent past medical history of CAD, PAF on  presenting to the clinic for subacute onset of back pain.  Back pain Patient complains of bilateral lumbar pain.  Not midline, rather lateral on both sides. Pain started about 3 weeks ago along with cold weather onset. Pain comes and goes, interferes with sleep.  Struggles to sit at work.  Bending over/bending down is extremely uncomfortable. Describes pain as dull ache.  5/10 at the moment.  May be improving slightly. Took 2 pills of acetaminophen 650 mg last week, none since. Denies paraesthesias and numbness in legs.  No weakness in BLE, no changes in gait. Denies urinary and bowel incontinence.  Denies dysuria, itching, urinary frequency. Denies fevers, chills, weight loss (has gradually gained a few pounds per month past year). Fell on back after tripping on carpet a year ago, but no recent falls or injuries.   Diabetes Last A1c 6.5 on 09/20/2023 Home CBGs: Checking only occasionally, mostly 100-200 range Medications: Canagliflozin 100 mg daily, metformin 1000 mg BID Adherence: Good Eye exam: UTD Foot exam: Deferred to PCP Microalbumin: Due Statin: Rosuvastatin 40 mg daily No symptoms of hypoglycemia, polyuria, polydipsia, foot ulcers/trauma Mild paraesthesias of hands, cold hands lately with cold weather   PERTINENT PMH / PSH: CAD, knee osteoarthritis, obesity, GERD Does not drink alcohol or smoke No PMH of malignancy   OBJECTIVE:   BP 139/72   Pulse 75   Wt 218 lb 3.2 oz (99 kg)   SpO2 100%   BMI 38.65 kg/m   General: Age-appropriate, resting comfortably in chair, NAD, alert and at baseline. Cardiovascular: Regular rate and rhythm. Normal S1/S2. No murmurs, rubs, or gallops appreciated. 2+ radial pulses. Abdominal: No tenderness to deep or light palpation. No rebound or guarding. No CVA tenderness. MSK: Negative straight leg raise  bilaterally.  No pain with logroll of bilateral legs.  No point tenderness over greater trochanter or SI joints bilaterally.  Full passive ROM with spinal flexion, spinal extension mildly limited by pain.  Gait unsteady with ambulation, uses cane for assistance. Extremities: No peripheral edema bilaterally. Capillary refill <2 seconds.   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  Patient has excellent ROM of hips and back, but mild pain with spinal extension.  No red flag symptoms for malignancy, sciatic impingement, or cauda equina syndrome on history.  Physical exam unremarkable.  XR not indicated at this time, can consider next visit if not improving.  Unfortunately, poor candidate for oral NSAIDs given CAD, GERD, and age. - Multimodal pain therapy: Lidocaine patch OTC 12 hours daily, Voltaren gel QID daily (already has tube), and increase acetaminophen to 650 mg QID or Q6h for next 2-4 weeks - Declines muscle relaxer and PT today, reconsider next appointment if pain not improving - Follow up 2-4 weeks if not improving or worsening - Return precautions per AVS Controlled type 2 diabetes mellitus without complication, without long-term current use of insulin (HCC) In October, A1c at goal <7.5% for older adult. - Urine Microalbumin/Cr ratio today - Continue metformin 1000 mg BID and cangliflozin 100 mg daily - Defer foot exam and further management to PCP  Neyla Gauntt Sharion Dove, MD [facility]

## 2024-01-02 LAB — MICROALBUMIN / CREATININE URINE RATIO
Creatinine, Urine: 49.3 mg/dL
Microalb/Creat Ratio: 88 mg/g{creat} — ABNORMAL HIGH (ref 0–29)
Microalbumin, Urine: 43.3 ug/mL

## 2024-01-02 NOTE — Progress Notes (Signed)
Moderately increased albuminuria noted from routine labs during acute care visit 1/23.  Given patient's comorbid hypertension, would likely benefit from adding ACE/ARB or transitioning from amlodipine to ACE/ARB for renal protection.  Called patient to review lab findings and discuss importance of addressing albuminuria for long term renal outcomes.  Discussed options to adjust medications and patient unsure of comfort level with therapy change at this time.  Scheduled patient for 2/7 at 9:10 AM with Dr. Hyacinth Meeker, PCP for baseline labs and adjustment of medications.  Will defer adjustment of anti-hypertensives and T2DM therapy to PCP.

## 2024-01-16 ENCOUNTER — Ambulatory Visit: Payer: Self-pay | Admitting: Student

## 2024-01-23 ENCOUNTER — Ambulatory Visit (INDEPENDENT_AMBULATORY_CARE_PROVIDER_SITE_OTHER): Payer: Medicare HMO | Admitting: Student

## 2024-01-23 VITALS — BP 135/70 | HR 70 | Temp 98.4°F | Ht 63.0 in | Wt 216.6 lb

## 2024-01-23 DIAGNOSIS — N1831 Chronic kidney disease, stage 3a: Secondary | ICD-10-CM

## 2024-01-23 DIAGNOSIS — I10 Essential (primary) hypertension: Secondary | ICD-10-CM | POA: Diagnosis not present

## 2024-01-23 DIAGNOSIS — N189 Chronic kidney disease, unspecified: Secondary | ICD-10-CM | POA: Insufficient documentation

## 2024-01-23 MED ORDER — VALSARTAN 80 MG PO TABS
80.0000 mg | ORAL_TABLET | Freq: Every day | ORAL | 0 refills | Status: DC
Start: 2024-01-23 — End: 2024-02-09

## 2024-01-23 NOTE — Progress Notes (Signed)
    SUBJECTIVE:   CHIEF COMPLAINT / HPI:   Meredith [name] is a 73 y.o. female  presenting for follow up of abnormal labs.  She carries a diagnosis of type 2 diabetes on an SGLT2 and metformin with hypertension.  Per labs drawn at last visit her microalbumin creatinine ratio has been elevated to 88 mg/g.  She is here to discuss next steps of management.  PERTINENT  PMH / PSH: Reviewed and updated   OBJECTIVE:   BP 135/70   Pulse 70   Temp 98.4 F (36.9 C)   Ht 5\' 3"  (1.6 m)   Wt 216 lb 9.6 oz (98.2 kg)   SpO2 93%   BMI 38.37 kg/m   Well-appearing, no acute distress Cardio: Regular rate, irregularly irregular rhythm, no murmurs on exam. Pulm: Clear, no wheezing, no crackles. No increased work of breathing Abdominal: bowel sounds present, soft, non-tender, non-distended Extremities: no peripheral edema  Neuro: alert and oriented x3, speech normal in content, no facial asymmetry, strength intact and equal bilaterally in UE and LE, pupils equal and reactive to light.  Psych:  Cognition and judgment appear intact. Alert, communicative  and cooperative with normal attention span and concentration. No apparent delusions, illusions, hallucinations      01/01/2024    2:12 PM 07/11/2023    3:45 PM 09/19/2022    2:28 PM  PHQ9 SCORE ONLY  PHQ-9 Total Score 4 0 5      ASSESSMENT/PLAN:   HYPERTENSION, BENIGN SYSTEMIC Moderately controlled in the office with 10 mg amlodipine.  However with rising urine microalbumin creatinine ratio we will switch her to an ARB valsartan 80 mg daily.  Educated patient's on side effects including hyperkalemia.  She is scheduled back in 1 week to recheck a BMP.  Per the kidney failure risk calculator she has a 0.58% risk of kidney failure in 5 years.  Will continue her SGLT2 inhibitor.   Chronic kidney disease (CKD) Will need routine monitoring every 6 months with BMP.  Will need urine creatinine albumin ratio annually   At next visit: Check BMP Discuss  shingles vaccine Discuss colonoscopy  Glendale Chard, DO [facility]

## 2024-01-23 NOTE — Patient Instructions (Addendum)
I am stopping the amlodipine 10 mg. Since you took this medication this morning 01/23/24 do not take the new medication today.  I am starting you on a medicine called valsartan/Diovan 80 mg.  You are coming back to see me to check on how the medication is doing and to check your blood work.   Keep taking all of your other medications as prescribed.    Future Appointments  Date Time Provider Department Center  01/30/2024  8:30 AM Helane Gunther, DPM [facility]  01/30/2024 10:10 AM Glendale Chard, DO [facility]  02/13/2024  8:20 AM Quintella Reichert, MD [facility]    Please arrive 15 minutes before your appointment to ensure smooth check in process.    Please call the clinic at [phone] if your symptoms worsen or you have any concerns.  Thank you for allowing me to participate in your care, Dr. Glendale Chard [facility]

## 2024-01-23 NOTE — Assessment & Plan Note (Signed)
Moderately controlled in the office with 10 mg amlodipine.  However with rising urine microalbumin creatinine ratio we will switch her to an ARB valsartan 80 mg daily.  Educated patient's on side effects including hyperkalemia.  She is scheduled back in 1 week to recheck a BMP.  Per the kidney failure risk calculator she has a 0.58% risk of kidney failure in 5 years.  Will continue her SGLT2 inhibitor.

## 2024-01-23 NOTE — Assessment & Plan Note (Signed)
Will need routine monitoring every 6 months with BMP.  Will need urine creatinine albumin ratio annually

## 2024-01-30 ENCOUNTER — Encounter: Payer: Self-pay | Admitting: Podiatry

## 2024-01-30 ENCOUNTER — Ambulatory Visit (INDEPENDENT_AMBULATORY_CARE_PROVIDER_SITE_OTHER): Payer: Medicare HMO | Admitting: Student

## 2024-01-30 ENCOUNTER — Ambulatory Visit: Payer: Medicare HMO | Admitting: Podiatry

## 2024-01-30 VITALS — BP 136/75 | HR 70 | Temp 98.1°F | Ht 63.0 in | Wt 215.2 lb

## 2024-01-30 VITALS — Ht 63.0 in | Wt 216.0 lb

## 2024-01-30 DIAGNOSIS — E119 Type 2 diabetes mellitus without complications: Secondary | ICD-10-CM

## 2024-01-30 DIAGNOSIS — B351 Tinea unguium: Secondary | ICD-10-CM | POA: Diagnosis not present

## 2024-01-30 DIAGNOSIS — M79674 Pain in right toe(s): Secondary | ICD-10-CM

## 2024-01-30 DIAGNOSIS — M79675 Pain in left toe(s): Secondary | ICD-10-CM | POA: Diagnosis not present

## 2024-01-30 DIAGNOSIS — I10 Essential (primary) hypertension: Secondary | ICD-10-CM | POA: Diagnosis not present

## 2024-01-30 MED ORDER — ZOSTER VAC RECOMB ADJUVANTED 50 MCG/0.5ML IM SUSR: 0.5000 mL | Freq: Once | INTRAMUSCULAR | 0 refills | Status: AC

## 2024-01-30 NOTE — Progress Notes (Signed)
    SUBJECTIVE:   CHIEF COMPLAINT / HPI:   Meredith [name] is a 73 y.o. female  presenting for follow up of BP medication change. She was recently switched from amlodipine 10 mg to valsartan 80 mg daily due to rising urine microalbumin ratio.   Hypertension: She has been doing well on her new medication.  She denies side effects.  She does not check her blood pressure at home  PERTINENT  PMH / PSH: Reviewed and updated   OBJECTIVE:   BP 136/75 (Cuff Size: Normal)   Pulse 70   Temp 98.1 F (36.7 C)   Ht 5\' 3"  (1.6 m)   Wt 215 lb 3.2 oz (97.6 kg)   SpO2 97%   BMI 38.12 kg/m   Well-appearing, no acute distress, ambulates with cane Cardio: Regular rate, irregular irregular rhythm, no murmurs on exam. Pulm: Clear, no wheezing, no crackles. No increased work of breathing Abdominal: bowel sounds present, soft, non-tender, non-distended Extremities: no peripheral edema, diabetic foot exam within normal limits Neuro: alert and oriented x3, speech normal in content, no facial asymmetry, strength intact and equal bilaterally in UE and LE, pupils equal and reactive to light.  Psych:  Cognition and judgment appear intact. Alert, communicative  and cooperative with normal attention span and concentration. No apparent delusions, illusions, hallucinations      01/30/2024   10:10 AM 01/01/2024    2:12 PM 07/11/2023    3:45 PM  PHQ9 SCORE ONLY  PHQ-9 Total Score 0 4 0      ASSESSMENT/PLAN:   HYPERTENSION, BENIGN SYSTEMIC Blood pressure at goal which is below 140/90.  Continue valsartan as prescribed.  Will check BMP for potassium today.   Health maintenance: Discussed shingles vaccine, patient is hesitant to receive counseling provided Declined colonoscopy  Glendale Chard, DO [facility]

## 2024-01-30 NOTE — Assessment & Plan Note (Signed)
Blood pressure at goal which is below 140/90.  Continue valsartan as prescribed.  Will check BMP for potassium today.

## 2024-01-30 NOTE — Patient Instructions (Signed)
It was great to see you today!   Come back and see me in 3 months for follow up. Continue taking your medication as prescribed.   Future Appointments  Date Time Provider Department Center  01/30/2024 10:10 AM Glendale Chard, DO [facility]  02/13/2024  8:20 AM Quintella Reichert, MD [facility]  04/30/2024  8:30 AM Helane Gunther, DPM [facility]    Please arrive 15 minutes before your appointment to ensure smooth check in process.    Please call the clinic at [phone] if your symptoms worsen or you have any concerns.  Blood Pressure Record Sheet To take your blood pressure, you will need a blood pressure machine. You can buy a blood pressure machine (blood pressure monitor) at your clinic, drug store, or online. When choosing one, consider: An automatic monitor that has an arm cuff. A cuff that wraps snugly around your upper arm. You should be able to fit only one finger between your arm and the cuff. A device that stores blood pressure reading results. Do not choose a monitor that measures your blood pressure from your wrist or finger. Follow your health care provider's instructions for how to take your blood pressure. To use this form: Take your blood pressure medications every day These measurements should be taken when you have been at rest for at least 10-15 min Take at least 2 readings with each blood pressure check. This makes sure the results are correct. Wait 1-2 minutes between measurements. Write down the results in the spaces on this form. Keep in mind it should always be recorded systolic over diastolic. Both numbers are important.  Repeat this every day for 2-3 weeks, or as told by your health care provider.  Make a follow-up appointment with your health care provider to discuss the results.  Blood Pressure Log Date Medications taken? (Y/N) Blood Pressure Time of Day                                                                                                          Thank you for allowing me to participate in your care, Dr. Glendale Chard [facility]

## 2024-01-30 NOTE — Progress Notes (Addendum)
This patient returns to my office for at risk foot care.  This patient requires this care by a professional since this patient will be at risk due to having diabetes.This patient is unable to cut nails herself since the patient cannot reach her nails.These nails are painful walking and wearing shoes.  This patient presents for at risk foot care today.  General Appearance  Alert, conversant and in no acute stress.  Vascular  Dorsalis pedis and posterior tibial  pulses are  weakly palpable  bilaterally.  Capillary return is within normal limits  bilaterally. Temperature is within normal limits  bilaterally.  Neurologic  Senn-Weinstein monofilament wire test within normal limits  bilaterally. Muscle power within normal limits bilaterally.  Nails Thick disfigured discolored nails with subungual debris  from hallux to fifth toes bilaterally. No evidence of bacterial infection or drainage bilaterally.  Orthopedic  No limitations of motion  feet .  No crepitus or effusions noted.  No bony pathology or digital deformities noted.  Skin  normotropic skin with no porokeratosis noted bilaterally.  No signs of infections or ulcers noted.     Onychomycosis  Pain in right toes  Pain in left toes  Consent was obtained for treatment procedures.   Mechanical debridement of nails 1-5  bilaterally performed with a nail nipper.  Filed with dremel without incident.    Return office visit   3 months                   Told patient to return for periodic foot care and evaluation due to potential at risk complications.   Gracelin Weisberg DPM  

## 2024-01-31 LAB — BASIC METABOLIC PANEL
BUN/Creatinine Ratio: 19 (ref 12–28)
BUN: 17 mg/dL (ref 8–27)
CO2: 22 mmol/L (ref 20–29)
Calcium: 9.8 mg/dL (ref 8.7–10.3)
Chloride: 106 mmol/L (ref 96–106)
Creatinine, Ser: 0.89 mg/dL (ref 0.57–1.00)
Glucose: 115 mg/dL — ABNORMAL HIGH (ref 70–99)
Potassium: 4.2 mmol/L (ref 3.5–5.2)
Sodium: 143 mmol/L (ref 134–144)
eGFR: 68 mL/min/{1.73_m2} (ref >59)

## 2024-02-02 ENCOUNTER — Encounter: Payer: Self-pay | Admitting: Family Medicine

## 2024-02-05 ENCOUNTER — Emergency Department (HOSPITAL_COMMUNITY)
Admission: EM | Admit: 2024-02-05 | Discharge: 2024-02-05 | Disposition: A | Discharge status: Home / Self Care | Payer: Medicare HMO | Attending: Emergency Medicine | Admitting: Emergency Medicine

## 2024-02-05 ENCOUNTER — Emergency Department (HOSPITAL_COMMUNITY): Payer: Medicare HMO

## 2024-02-05 ENCOUNTER — Encounter (HOSPITAL_COMMUNITY): Payer: Self-pay

## 2024-02-05 ENCOUNTER — Other Ambulatory Visit: Payer: Self-pay

## 2024-02-05 DIAGNOSIS — R000 Tachycardia, unspecified: Secondary | ICD-10-CM | POA: Diagnosis not present

## 2024-02-05 DIAGNOSIS — R9389 Abnormal findings on diagnostic imaging of other specified body structures: Secondary | ICD-10-CM | POA: Diagnosis not present

## 2024-02-05 DIAGNOSIS — I10 Essential (primary) hypertension: Secondary | ICD-10-CM | POA: Diagnosis not present

## 2024-02-05 DIAGNOSIS — R002 Palpitations: Secondary | ICD-10-CM | POA: Insufficient documentation

## 2024-02-05 DIAGNOSIS — Z7901 Long term (current) use of anticoagulants: Secondary | ICD-10-CM | POA: Insufficient documentation

## 2024-02-05 DIAGNOSIS — R0602 Shortness of breath: Secondary | ICD-10-CM | POA: Diagnosis not present

## 2024-02-05 LAB — COMPREHENSIVE METABOLIC PANEL
ALT: 14 U/L (ref 0–44)
AST: 21 U/L (ref 15–41)
Albumin: 3.7 g/dL (ref 3.5–5.0)
Alkaline Phosphatase: 37 U/L — ABNORMAL LOW (ref 38–126)
Anion gap: 12 (ref 5–15)
BUN: 20 mg/dL (ref 8–23)
CO2: 25 mmol/L (ref 22–32)
Calcium: 9.9 mg/dL (ref 8.9–10.3)
Chloride: 106 mmol/L (ref 98–111)
Creatinine, Ser: 0.89 mg/dL (ref 0.44–1.00)
GFR, Estimated: >60 mL/min (ref >60)
Glucose, Bld: 100 mg/dL — ABNORMAL HIGH (ref 70–99)
Potassium: 3.7 mmol/L (ref 3.5–5.1)
Sodium: 143 mmol/L (ref 135–145)
Total Bilirubin: 0.6 mg/dL (ref 0.0–1.2)
Total Protein: 6.9 g/dL (ref 6.5–8.1)

## 2024-02-05 LAB — CBC WITH DIFFERENTIAL/PLATELET
Abs Immature Granulocytes: 0.01 10*3/uL (ref 0.00–0.07)
Basophils Absolute: 0 10*3/uL (ref 0.0–0.1)
Basophils Relative: 0 %
Eosinophils Absolute: 0.1 10*3/uL (ref 0.0–0.5)
Eosinophils Relative: 3 %
HCT: 37.3 % (ref 36.0–46.0)
Hemoglobin: 12.6 g/dL (ref 12.0–15.0)
Immature Granulocytes: 0 %
Lymphocytes Relative: 30 %
Lymphs Abs: 1.3 10*3/uL (ref 0.7–4.0)
MCH: 26 pg (ref 26.0–34.0)
MCHC: 33.8 g/dL (ref 30.0–36.0)
MCV: 76.9 fL — ABNORMAL LOW (ref 80.0–100.0)
Monocytes Absolute: 0.3 10*3/uL (ref 0.1–1.0)
Monocytes Relative: 7 %
Neutro Abs: 2.7 10*3/uL (ref 1.7–7.7)
Neutrophils Relative %: 60 %
Platelets: 211 10*3/uL (ref 150–400)
RBC: 4.85 MIL/uL (ref 3.87–5.11)
RDW: 15.1 % (ref 11.5–15.5)
WBC: 4.5 10*3/uL (ref 4.0–10.5)
nRBC: 0 % (ref 0.0–0.2)

## 2024-02-05 LAB — TROPONIN I (HIGH SENSITIVITY)
Troponin I (High Sensitivity): <2 ng/L (ref <18)
Troponin I (High Sensitivity): 3 ng/L (ref <18)

## 2024-02-05 LAB — MAGNESIUM: Magnesium: 1.6 mg/dL — ABNORMAL LOW (ref 1.7–2.4)

## 2024-02-05 MED ORDER — MAGNESIUM OXIDE -MG SUPPLEMENT 400 (240 MG) MG PO TABS
400.0000 mg | ORAL_TABLET | ORAL | Status: AC
  Administered 2024-02-05: 400 mg via ORAL
  Filled 2024-02-05: qty 1

## 2024-02-05 MED ORDER — METOPROLOL SUCCINATE ER 25 MG PO TB24: 75.0000 mg | ORAL_TABLET | Freq: Every day | ORAL | 2 refills | Status: DC

## 2024-02-05 MED ORDER — POTASSIUM CHLORIDE CRYS ER 20 MEQ PO TBCR
40.0000 meq | EXTENDED_RELEASE_TABLET | Freq: Once | ORAL | Status: AC
  Administered 2024-02-05: 40 meq via ORAL
  Filled 2024-02-05: qty 2

## 2024-02-05 NOTE — ED Provider Triage Note (Signed)
 Emergency Medicine Provider Triage Evaluation Note  Meredith [name] , a 73 y.o. female  was evaluated in triage.  Pt complains of transient palpitations and dyspnea.  Was getting up to clean her house and while up felt palpitations and a slight dyspnea.  Overall lasted about 5-10 minutes.  Feels completely back to normal now.  Review of Systems  Positive: Palpitations, shortness of breath Negative: Chest pain, leg swelling  Physical Exam  BP (!) 163/100   Pulse 75   Temp 97.7 F (36.5 C) (Oral)   Resp 14   Ht 5\' 3"  (1.6 m)   Wt 97.5 kg   SpO2 96%   BMI 38.09 kg/m  Gen:   Awake, no distress   Resp:  Normal effort, no wheezing MSK:   Moves extremities without difficulty, no edema   Medical Decision Making  Medically screening exam initiated at 9:56 AM.  Appropriate orders placed.  Meredith [name] was informed that the remainder of the evaluation will be completed by another provider, this initial triage assessment does not replace that evaluation, and the importance of remaining in the ED until their evaluation is complete.  EKG benign.  Will order chest x-ray and labs.   Meredith Loveless, MD 02/05/24 [phone]

## 2024-02-05 NOTE — ED Provider Notes (Addendum)
 [place] EMERGENCY DEPARTMENT AT [facility] Provider Note   CSN: [id number] Arrival date & time: 02/05/24  0930     History  Chief Complaint  Patient presents with   Shortness of Breath    Meredith [name] is a 73 y.o. female.  73 year old female with history of atrial fibrillation on metoprolol and Xarelto who presents emergency department palpitations.  Patient reports that just prior to arrival she had approximately 10 minutes of palpitations.  Denied any chest pain or shortness of breath with that.  Does have a history of atrial fibrillation and reports compliance with her medications.  No alcohol or caffeine use.  No recent illnesses.  Says she feels totally back to baseline at this time.       Home Medications Prior to Admission medications   Medication Sig Start Date End Date Taking? Authorizing Provider  Accu-Chek Softclix Lancets lancets Use as instructed to test blood glucose once daily. ICD-10 code: E11.9 10/27/20   Valetta Close, MD  acetaminophen (ACETAMINOPHEN 8 HOUR) 650 MG CR tablet Take 1 tablet (650 mg total) by mouth every 8 (eight) hours. Patient taking differently: Take 650 mg by mouth every 8 (eight) hours as needed for pain. 10/22/18   Mesner, Barbara Cower, MD  Bempedoic Acid-Ezetimibe (NEXLIZET) 180-10 MG TABS TAKE 1 TABLET BY MOUTH EVERY DAY 01/01/24   Quintella Reichert, MD  Blood Glucose Monitoring Suppl (ACCU-CHEK AVIVA PLUS) w/Device KIT 1 kit by Does not apply route as directed. ICD-10 code: E11.9 09/17/17   McDiarmid, Leighton Roach, MD  canagliflozin [facility]) 100 MG TABS tablet Take 1 tablet (100 mg total) by mouth daily before breakfast. 01/24/23   Valetta Close, MD  diclofenac sodium (VOLTAREN) 1 % GEL Apply 2 g topically 4 (four) times daily. Patient taking differently: Apply 2 g topically 4 (four) times daily as needed (knee pain). 08/27/19   Marthenia Rolling, DO  ezetimibe (ZETIA) 10 MG tablet Take 10 mg by mouth daily. 06/24/22   [provider]  fexofenadine (ALLEGRA) 180 MG tablet Take 1 tablet (180 mg total) by mouth daily as needed for allergies. 02/04/22   Shirlean Mylar, MD  metFORMIN (GLUCOPHAGE) 1000 MG tablet TAKE 1 TABLET BY MOUTH TWICE A DAY WITH FOOD 12/30/23   Glendale Chard, DO  metoprolol succinate (TOPROL-XL) 25 MG 24 hr tablet Take 3 tablets (75 mg total) by mouth daily. Take with or immediately following a meal. 02/05/24 03/06/24  Rondel Baton, MD  mineral oil-hydrophilic petrolatum (AQUAPHOR) ointment Apply topically as needed for dry skin. 04/11/22   Dana Allan, MD  olopatadine (PATADAY) 0.1 % ophthalmic solution Place 1 drop into both eyes 2 (two) times daily. 04/11/22   Dana Allan, MD  potassium chloride (KLOR-CON) 10 MEQ tablet TAKE 2 TABLETS BY MOUTH TWICE A DAY Patient not taking: Reported on 01/01/2024 01/01/24   Quintella Reichert, MD  rivaroxaban (XARELTO) 20 MG TABS tablet Take 1 tablet (20 mg total) by mouth daily. 01/01/24   Quintella Reichert, MD  rosuvastatin (CRESTOR) 40 MG tablet TAKE 1 TABLET BY MOUTH EVERY DAY 02/13/23   Quintella Reichert, MD  valsartan (DIOVAN) 80 MG tablet Take 1 tablet (80 mg total) by mouth at bedtime. 01/23/24   Glendale Chard, DO      Allergies    Lisinopril, Doxycycline, Flexeril [cyclobenzaprine hcl], Lipitor [atorvastatin calcium], and Metaxalone    Review of Systems   Review of Systems  Physical Exam Updated Vital Signs BP (!) 155/75  Pulse 71   Temp 98.7 F (37.1 C)   Resp 18   Ht 5\' 3"  (1.6 m)   Wt 97.5 kg   SpO2 99%   BMI 38.09 kg/m  Physical Exam Vitals and nursing note reviewed.  Constitutional:      General: She is not in acute distress.    Appearance: She is well-developed.  HENT:     Head: Normocephalic and atraumatic.     Right Ear: External ear normal.     Left Ear: External ear normal.     Nose: Nose normal.  Eyes:     Extraocular Movements: Extraocular movements intact.     Conjunctiva/sclera: Conjunctivae normal.     Pupils: Pupils  are equal, round, and reactive to light.  Cardiovascular:     Rate and Rhythm: Normal rate and regular rhythm.     Heart sounds: No murmur heard. Pulmonary:     Effort: Pulmonary effort is normal. No respiratory distress.     Breath sounds: Normal breath sounds.  Abdominal:     General: Abdomen is flat. There is no distension.     Palpations: Abdomen is soft. There is no mass.     Tenderness: There is no abdominal tenderness. There is no guarding.  Musculoskeletal:     Cervical back: Normal range of motion and neck supple.     Right lower leg: No edema.     Left lower leg: No edema.  Skin:    General: Skin is warm and dry.  Neurological:     Mental Status: She is alert and oriented to person, place, and time. Mental status is at baseline.  Psychiatric:        Mood and Affect: Mood normal.     ED Results / Procedures / Treatments   Labs (all labs ordered are listed, but only abnormal results are displayed) Labs Reviewed  CBC WITH DIFFERENTIAL/PLATELET - Abnormal; Notable for the following components:      Result Value   MCV 76.9 (*)    All other components within normal limits  COMPREHENSIVE METABOLIC PANEL - Abnormal; Notable for the following components:   Glucose, Bld 100 (*)    Alkaline Phosphatase 37 (*)    All other components within normal limits  MAGNESIUM - Abnormal; Notable for the following components:   Magnesium 1.6 (*)    All other components within normal limits  TROPONIN I (HIGH SENSITIVITY)  TROPONIN I (HIGH SENSITIVITY)    EKG EKG Interpretation Date/Time:  Thursday February 05 2024 09:38:10 EST Ventricular Rate:  72 PR Interval:  234 QRS Duration:  104 QT Interval:  368 QTC Calculation: 402 R Axis:   -28  Text Interpretation: Sinus rhythm with 1st degree A-V block with Premature atrial complexes Septal infarct , age undetermined  similar to Sept 2023 Confirmed by Pricilla Loveless [phone]) on 02/05/2024 9:40:29 AM  Radiology DG Chest 2  View Result Date: 02/05/2024 CLINICAL DATA:  Shortness of breath. EXAM: CHEST - 2 VIEW COMPARISON:  09/06/2022. FINDINGS: Bilateral lung fields are clear. Bilateral costophrenic angles are clear. Normal cardio-mediastinal silhouette. Note is again made of elevated left hemidiaphragm. No acute osseous abnormalities. The soft tissues are within normal limits. IMPRESSION: No active cardiopulmonary disease. Electronically Signed   By: Jules Schick M.D.   On: 02/05/2024 12:07    Procedures Procedures    Medications Ordered in ED Medications  potassium chloride SA (KLOR-CON M) CR tablet 40 mEq (40 mEq Oral Given 02/05/24 1809)  magnesium oxide (MAG-OX)  tablet 400 mg (400 mg Oral Given 02/05/24 1809)    ED Course/ Medical Decision Making/ A&P                                 Medical Decision Making Amount and/or Complexity of Data Reviewed Labs: ordered. Radiology: ordered.  Risk OTC drugs. Prescription drug management.   ARIZBETH CAWTHORN is a 73 y.o. female with comorbidities that complicate the patient evaluation including atrial fibrillation on metoprolol and Xarelto who presents emergency department palpitations.  Initial Ddx:  Arrhythmia, electrolyte abnormality, thyroid disorder, PE  MDM:  Feel the patient is likely having a paroxysmal arrhythmia (most likely atrial fibrillation with) given their symptoms.  Will obtain EKG to evaluate for signs of malignant arrhythmia.  Will also keep the patient on telemetry while in the emergency department to monitor for events.  Will also check electrolytes and TSH to evaluate for secondary causes.  Considered PE but feel less likely at this time given the lack of shortness of breath or chest pain or signs of a DVT.   Plan:  Labs Electrolytes EKG Chest x-ray  ED Summary/Re-evaluation:  Patient monitored while in the emergency department and remained stable.  EKG reviewed and did not show signs of ARVC, HOCM, Brugada, long QT, WPW, or other  concerning features.  Labs were unremarkable aside from borderline low potassium and low magnesium that was replenished.  Ambulatory referral to cardiology placed for Holter monitor placement.  Will also have the patient follow-up with their primary doctor in several days.  Did increase her metoprolol dose as well any breakthrough episodes of A-fib with RVR.  This patient presents to the ED for concern of complaints listed in HPI, this involves an extensive number of treatment options, and is a complaint that carries with it a high risk of complications and morbidity. Disposition including potential need for admission considered.   Dispo: DC Home. Return precautions discussed including, but not limited to, those listed in the AVS. Allowed pt time to ask questions which were answered fully prior to dc.  Records reviewed Outpatient Clinic Notes The following labs were independently interpreted: Chemistry and show  no acute abnormalities I independently reviewed the following imaging with scope of interpretation limited to determining acute life threatening conditions related to emergency care: Chest x-ray and agree with the radiologist interpretation with the following exceptions: none I personally reviewed and interpreted the pt's EKG: see above for interpretation  I have reviewed the patients home medications and made adjustments as needed Social Determinants of health:  Elderly   Final Clinical Impression(s) / ED Diagnoses Final diagnoses:  Palpitations    Rx / DC Orders ED Discharge Orders          Ordered    Ambulatory referral to Cardiology        02/05/24 1750    metoprolol succinate (TOPROL-XL) 25 MG 24 hr tablet  Daily        02/05/24 1752              Rondel Baton, MD 02/07/24 0113    Rondel Baton, MD 02/07/24 [phone]

## 2024-02-05 NOTE — ED Notes (Signed)
 Pt reports she will call her sister to come pick her up from the hospital to take her home.

## 2024-02-05 NOTE — ED Triage Notes (Signed)
 Patient called ems bc she was sob but when ems arrived sob had resolved.  But around 0800 and was trying to get her vacuum cleaner out and felt sob of breath and that her heart rate was racing but only lasted a short amount of time and resolved and now feels back to normal.

## 2024-02-05 NOTE — ED Notes (Signed)
 Pt verbalized understanding of d/c instructions, prescription and follow up care. Pt wheeled out of ED via wheelchair, NAD.

## 2024-02-05 NOTE — Discharge Instructions (Addendum)
 You were seen for your palpitations in the emergency department.  Your palpitations were likely from atrial fibrillation.  At home, please take the metoprolol XL at a new dose (75 mg/day).    Follow-up with your primary doctor in 2-3 days regarding your visit.  Cardiology will be calling you regarding an appointment within the next 72 hours.  You may contact them if you do not hear from them in that time using the information in this packet.  Please talk to them to see if you need an outpatient (Holter) monitor.  Return immediately to the emergency department if you experience any of the following: Chest pain, shortness of breath, fainting, or any other concerning symptoms.    Thank you for visiting our Emergency Department. It was a pleasure taking care of you today.

## 2024-02-06 ENCOUNTER — Other Ambulatory Visit: Payer: Self-pay | Admitting: Student

## 2024-02-06 ENCOUNTER — Other Ambulatory Visit: Payer: Self-pay | Admitting: Cardiology

## 2024-02-06 ENCOUNTER — Other Ambulatory Visit: Payer: Self-pay | Admitting: Family Medicine

## 2024-02-06 DIAGNOSIS — I10 Essential (primary) hypertension: Secondary | ICD-10-CM

## 2024-02-06 DIAGNOSIS — E119 Type 2 diabetes mellitus without complications: Secondary | ICD-10-CM

## 2024-02-06 DIAGNOSIS — I4819 Other persistent atrial fibrillation: Secondary | ICD-10-CM

## 2024-02-09 NOTE — Telephone Encounter (Signed)
 Prescription refill request for Xarelto received.  Indication:AFIB Last office visit:upcoming Weight:97.5  kg Age:73 Scr:0.89 CrCl:86.65  ml/min  Prescription refilled

## 2024-02-13 ENCOUNTER — Ambulatory Visit: Payer: Medicare HMO | Attending: Cardiology | Admitting: Cardiology

## 2024-02-13 ENCOUNTER — Encounter: Payer: Self-pay | Admitting: Cardiology

## 2024-02-13 VITALS — BP 150/100 | HR 65 | Ht 63.0 in | Wt 214.0 lb

## 2024-02-13 DIAGNOSIS — I2510 Atherosclerotic heart disease of native coronary artery without angina pectoris: Secondary | ICD-10-CM

## 2024-02-13 DIAGNOSIS — I4819 Other persistent atrial fibrillation: Secondary | ICD-10-CM

## 2024-02-13 DIAGNOSIS — I10 Essential (primary) hypertension: Secondary | ICD-10-CM | POA: Diagnosis not present

## 2024-02-13 DIAGNOSIS — E7800 Pure hypercholesterolemia, unspecified: Secondary | ICD-10-CM

## 2024-02-13 MED ORDER — VALSARTAN 80 MG PO TABS: 80.0000 mg | ORAL_TABLET | Freq: Every day | ORAL | 3 refills | Status: DC

## 2024-02-13 MED ORDER — METOPROLOL SUCCINATE ER 25 MG PO TB24: 75.0000 mg | ORAL_TABLET | Freq: Every day | ORAL | 3 refills | Status: DC

## 2024-02-13 MED ORDER — RIVAROXABAN 20 MG PO TABS
20.0000 mg | ORAL_TABLET | Freq: Every day | ORAL | 3 refills | Status: DC
Start: 2024-02-13 — End: 2024-09-08

## 2024-02-13 MED ORDER — NEXLIZET 180-10 MG PO TABS: 1.0000 | ORAL_TABLET | Freq: Every day | ORAL | 3 refills | Status: DC

## 2024-02-13 MED ORDER — ROSUVASTATIN CALCIUM 40 MG PO TABS: 40.0000 mg | ORAL_TABLET | Freq: Every day | ORAL | 3 refills | Status: DC

## 2024-02-13 NOTE — Addendum Note (Signed)
 Addended by: Erick Alley on: 02/13/2024 08:53 AM   Modules accepted: Orders

## 2024-02-13 NOTE — Patient Instructions (Signed)
 Medication Instructions:  Refilled medications *If you need a refill on your cardiac medications before your next appointment, please call your pharmacy*   Lab Work: CMET, FASTING lipid If you have labs (blood work) drawn today and your tests are completely normal, you will receive your results only by: MyChart Message (if you have MyChart) OR A paper copy in the mail If you have any lab test that is abnormal or we need to change your treatment, we will call you to review the results.   Testing/Procedures: 24 hr blood pressure monitor   Follow-Up: At [facility], you and your health needs are our priority.  As part of our continuing mission to provide you with exceptional heart care, we have created designated Provider Care Teams.  These Care Teams include your primary Cardiologist (physician) and Advanced Practice Providers (APPs -  Physician Assistants and Nurse Practitioners) who all work together to provide you with the care you need, when you need it.  We recommend signing up for the patient portal called "MyChart".  Sign up information is provided on this After Visit Summary.  MyChart is used to connect with patients for Virtual Visits (Telemedicine).  Patients are able to view lab/test results, encounter notes, upcoming appointments, etc.  Non-urgent messages can be sent to your provider as well.   To learn more about what you can do with MyChart, go to [email or web address].    Your next appointment:   1 year(s)  Provider:   Armanda Magic, MD     Other Instructions   1st Floor: - Lobby - Registration  - Pharmacy  - Lab - Cafe  2nd Floor: - PV Lab - Diagnostic Testing (echo, CT, nuclear med)  3rd Floor: - Vacant  4th Floor: - TCTS (cardiothoracic surgery) - AFib Clinic - Structural Heart Clinic - Vascular Surgery  - Vascular Ultrasound  5th Floor: - [facility]Care Cardiology (general and EP) - Clinical Pharmacy for coumadin, hypertension, lipid,  weight-loss medications, and med management appointments    Valet parking services will be available as well.

## 2024-02-13 NOTE — Progress Notes (Addendum)
 Cardiology Office Note   Date:  02/13/2024   ID:  Meredith [name], DOB 1951/02/12, MRN [id number]  PCP:  Glendale Chard, DO  Cardiologist:  Armanda Magic, MD She Electrophysiologist:  None   Chief Complaint:  Afib, CAD, HLD, HTN  History of Present Illness:    Meredith [name] is a 73 y.o. 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    She is here today for followup and is doing well.  She was recently seen in the ER for palpitations that lasted 10 minutes and was gone by the time she got to the ER.  Her EKG was normal in the ER.  She had not had any other palpitations in some time. Her K+ and Mg were repleted. She denies any chest pain or pressure, SOB, DOE, PND, orthopnea, LE edema, dizziness or syncope. She is compliant with her meds and is tolerating meds with no SE.    Prior CV studies:   The following studies were reviewed today:  EKG from  02/05/2024  Past Medical History:  Diagnosis Date   ANEMIA, IRON DEFICIENCY, UNSPEC. 02/05/2007   Qualifier: Diagnosis of  By: Para March MD, Cheree Ditto     ASTHMA, INTERMITTENT 09/07/2010   Qualifier: Diagnosis of  By: Louanne Belton MD, Rolm Gala     CAD (coronary artery disease), native coronary artery 02/16/2015   Cath with 20% LCx and RCA   Colon polyps    CTS (carpal tunnel syndrome) 06/06/2016   Diabetes mellitus    Diverticulosis 05/17/12   DJD (degenerative joint disease) of lumbar spine    Enteritis    Flu vaccine need 09/19/2022   Gastric ulcer 04/2000   Hyperlipidemia    Hypertension    Irregular heart beat    Obesity    Osteoarthritis    Persistent atrial fibrillation (HCC)    CHADS2VASC score is 5 and on Xarelto   Pruritus 02/21/2021   Rash 02/15/2021   Renal cyst, left 05/17/12   Ventral hernia    Past Surgical History:  Procedure Laterality Date   ABDOMINAL HYSTERECTOMY     LEFT HEART CATHETERIZATION WITH CORONARY ANGIOGRAM N/A 02/16/2015    Procedure: LEFT HEART CATHETERIZATION WITH CORONARY ANGIOGRAM;  Surgeon: Kathleene Hazel, MD;  Location: [facility] CATH LAB;  Service: Cardiovascular;  Laterality: N/A;   OPERATIVE HYSTEROSCOPY     TUBAL LIGATION     UTERINE FIBROID SURGERY       Current Meds  Medication Sig   Accu-Chek Softclix Lancets lancets Use as instructed to test blood glucose once daily. ICD-10 code: E11.9   acetaminophen (ACETAMINOPHEN 8 HOUR) 650 MG CR tablet Take 1 tablet (650 mg total) by mouth every 8 (eight) hours. (Patient taking differently: Take 650 mg by mouth every 8 (eight) hours as needed for pain.)   Bempedoic Acid-Ezetimibe (NEXLIZET) 180-10 MG TABS TAKE 1 TABLET BY MOUTH EVERY DAY   Blood Glucose Monitoring Suppl (ACCU-CHEK AVIVA PLUS) w/Device KIT 1 kit by Does not apply route as directed. ICD-10 code: E11.9   diclofenac sodium (VOLTAREN) 1 % GEL Apply 2 g topically 4 (four) times daily. (Patient taking differently: Apply 2 g topically 4 (four) times daily as needed (knee pain).)   fexofenadine (ALLEGRA) 180 MG tablet Take 1 tablet (180 mg total) by mouth daily as needed for allergies.   INVOKANA 100 MG TABS tablet TAKE 1 TABLET BY MOUTH DAILY BEFORE BREAKFAST.  metFORMIN (GLUCOPHAGE) 1000 MG tablet TAKE 1 TABLET BY MOUTH TWICE A DAY WITH FOOD   metoprolol succinate (TOPROL-XL) 25 MG 24 hr tablet Take 3 tablets (75 mg total) by mouth daily. Take with or immediately following a meal.   metoprolol succinate (TOPROL-XL) 50 MG 24 hr tablet TAKE 1 TABLET BY MOUTH EVERY DAY WITH OR IMMEDIATELY FOLLOWING A MEAL   mineral oil-hydrophilic petrolatum (AQUAPHOR) ointment Apply topically as needed for dry skin.   olopatadine (PATADAY) 0.1 % ophthalmic solution Place 1 drop into both eyes 2 (two) times daily.   potassium chloride (KLOR-CON) 10 MEQ tablet TAKE 2 TABLETS BY MOUTH TWICE A DAY   rivaroxaban (XARELTO) 20 MG TABS tablet TAKE 1 TABLET BY MOUTH EVERY DAY   rosuvastatin (CRESTOR) 40 MG tablet TAKE 1 TABLET  BY MOUTH EVERY DAY   valsartan (DIOVAN) 80 MG tablet TAKE 1 TABLET BY MOUTH EVERYDAY AT BEDTIME   [DISCONTINUED] ezetimibe (ZETIA) 10 MG tablet Take 10 mg by mouth daily.     Allergies:   Lisinopril, Doxycycline, Flexeril [cyclobenzaprine hcl], Lipitor [atorvastatin calcium], and Metaxalone   Social History   Tobacco Use   Smoking status: Former    Current packs/day: 0.00    Average packs/day: 1 pack/day for 5.0 years (5.0 ttl pk-yrs)    Types: Cigarettes    Start date: 12/09/1973    Quit date: 12/09/1978    Years since quitting: 45.2   Smokeless tobacco: Never  Vaping Use   Vaping status: Never Used  Substance Use Topics   Alcohol use: No   Drug use: No     Family Hx: The patient's family history includes Clotting disorder in an other family member; Diabetes in her mother, sister, sister, sister, and sister; Heart disease in her father and mother; Kidney disease in her mother; Lung cancer in her father; Stomach cancer in her sister. There is no history of Colon cancer.  ROS:   Please see the history of present illness.    none All other systems reviewed and are negative.   Labs/Other Tests and Data Reviewed:    Recent Labs: 02/05/2024: ALT 14; BUN 20; Creatinine, Ser 0.89; Hemoglobin 12.6; Magnesium 1.6; Platelets 211; Potassium 3.7; Sodium 143   Recent Lipid Panel Lab Results  Component Value Date/Time   CHOL 134 03/14/2022 08:08 AM   TRIG 81 03/14/2022 08:08 AM   HDL 49 03/14/2022 08:08 AM   CHOLHDL 2.7 03/14/2022 08:08 AM   CHOLHDL 2.3 05/16/2016 07:39 AM   LDLCALC 69 03/14/2022 08:08 AM   LDLDIRECT 130 (H) 05/13/2012 09:17 AM    Wt Readings from Last 3 Encounters:  02/13/24 214 lb (97.1 kg)  02/05/24 215 lb (97.5 kg)  01/30/24 215 lb 3.2 oz (97.6 kg)     Objective:    Vital Signs:  BP (!) 150/100   Pulse 65   Ht 5\' 3"  (1.6 m)   Wt 214 lb (97.1 kg)   SpO2 98%   BMI 37.91 kg/m   GEN: Well nourished, well developed in no acute distress HEENT:  Normal NECK: No JVD; No carotid bruits LYMPHATICS: No lymphadenopathy CARDIAC:RRR, no murmurs, rubs, gallops RESPIRATORY:  Clear to auscultation without rales, wheezing or rhonchi  ABDOMEN: Soft, non-tender, non-distended MUSCULOSKELETAL:  No edema; No deformity  SKIN: Warm and dry NEUROLOGIC:  Alert and oriented x 3 PSYCHIATRIC:  Normal affect  ASSESSMENT & PLAN:    1.  Persistent atrial fibrillation -She is in normal sinus rhythm today on exam  -had some  palpitations last month for 10 minutes and went to the ER where EKG was normal and her Toprol was increased to 75mg  daily -No bleeding issues on DOAC -Continue prescription drug management with Toprol-XL 75 mg daily and Xarelto 20 mg daily -I have personally reviewed and interpreted outside labs performed by patient's PCP which showed K+ 3.7 and SCr 0.89, Hbg 12.6 on 02/05/2024  2.  HTN -BP is elevated on exam today -Continue prescription drug management with valsartan 80 mg daily, Toprol-XL 75 mg daily with as needed refills -24 hour BP monitor  3.  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 -Denies any chest pain or shortness of breath -Continue Toprol-XL 75 mg daily and Crestor 40 mg daily -no ASA due to DOAC  4.  HLD -LDL goal < 70 -check FLP and ALT -Can prescription drug management with Crestor 40 mg daily and Nexlizet 180-10mg  daily with PRN refills  Medication Adjustments/Labs and Tests Ordered: Current medicines are reviewed at length with the patient today.  Concerns regarding medicines are outlined above.  Tests Ordered: No orders of the defined types were placed in this encounter.   Medication Changes: No orders of the defined types were placed in this encounter.    Disposition:  Follow up in 1 year(s)  Signed, Armanda Magic, MD  02/13/2024 8:32 AM    [facility]

## 2024-02-19 NOTE — Telephone Encounter (Signed)
 PT came in this morning very confused about a heart monitor can you call the PT and talk to her

## 2024-02-26 NOTE — Telephone Encounter (Signed)
 Pt seen in ED on 02/05/24: ED Summary/Re-evaluation:  Patient monitored while in the emergency department and remained stable.  EKG reviewed and did not show signs of ARVC, HOCM, Brugada, long QT, WPW, or other concerning features.  Labs were unremarkable aside from borderline low potassium and low magnesium that was replenished.  Ambulatory referral to cardiology placed for Holter monitor placement.  Seen here for f/u visit on 02/13/24: 1.  Persistent atrial fibrillation -She is in normal sinus rhythm today on exam  -had some palpitations last month for 10 minutes and went to the ER where EKG was normal and her Toprol was increased to 75mg  daily -No bleeding issues on DOAC -Continue prescription drug management with Toprol-XL 75 mg daily and Xarelto 20 mg daily -I have personally reviewed and interpreted outside labs performed by patient's PCP which showed K+ 3.7 and SCr 0.89, Hbg 12.6 on 02/05/2024 2.  HTN -BP is elevated on exam today -Continue prescription drug management with valsartan 80 mg daily, Toprol-XL 75 mg daily with as needed refills -24 hour BP monitor  Returned call to patient to let her know that it's a 24 hour BP monitor that was planned. Checked with Andee Lineman who states that we're about 8 weeks out on a monitor. Pt verbalized understanding and knows that we'll call her when we have it ready for her.

## 2024-03-19 ENCOUNTER — Ambulatory Visit: Admitting: Student

## 2024-03-19 VITALS — BP 168/88 | HR 66 | Ht 63.0 in | Wt 211.6 lb

## 2024-03-19 DIAGNOSIS — E119 Type 2 diabetes mellitus without complications: Secondary | ICD-10-CM | POA: Diagnosis not present

## 2024-03-19 DIAGNOSIS — I10 Essential (primary) hypertension: Secondary | ICD-10-CM

## 2024-03-19 LAB — POCT GLYCOSYLATED HEMOGLOBIN (HGB A1C): HbA1c, POC (controlled diabetic range): 6.2 % (ref 0.0–7.0)

## 2024-03-19 MED ORDER — AMLODIPINE BESYLATE-VALSARTAN 5-160 MG PO TABS: 1.0000 | ORAL_TABLET | Freq: Every day | ORAL | 0 refills | Status: DC

## 2024-03-19 NOTE — Progress Notes (Signed)
    SUBJECTIVE:   CHIEF COMPLAINT / HPI:   Meredith [name] is a 73 y.o. female presenting for blood pressure follow up and note from work.   Blood pressure: Recently switched to valsartan 80 mg due to worsening kidney function and added benefits of ARB.  She was previously within goal.  However at cardiology appointment and her blood pressure was 150/100.  She was increased on metoprolol to 75 mg daily.  She denies neurological symptoms.  PERTINENT  PMH / PSH: reviewed and updated.  OBJECTIVE:   BP (!) 168/88   Pulse 66   Ht 5\' 3"  (1.6 m)   Wt 211 lb 9.6 oz (96 kg)   SpO2 99%   BMI 37.48 kg/m   Well-appearing, no acute distress, ambulates with cane Cardio: Regular rate, regular rhythm, no murmurs on exam. Pulm: Clear, no wheezing, no crackles. No increased work of breathing Abdominal: bowel sounds present, soft, non-tender, non-distended Extremities: no peripheral edema  Neuro: alert and oriented x3, speech normal in content, no facial asymmetry, strength intact and equal bilaterally in UE and LE, pupils equal and reactive to light.  Psych:  Cognition and judgment appear intact. Alert, communicative  and cooperative with normal attention span and concentration. No apparent delusions, illusions, hallucinations    ASSESSMENT/PLAN:   Assessment & Plan HYPERTENSION, BENIGN SYSTEMIC Discontinue valsartan 80 mg and start combination pill valsartan-amlodipine 160/5 mg daily.  Patient scheduled for follow-up in 2 weeks.  Patient is a risk for falls due to using a cane please ensure she is not hypotensive at the next visit. Controlled type 2 diabetes mellitus without complication, without long-term current use of insulin (HCC) A1c collected today and decreased.  Instructed patient that she does not need to check her blood sugar daily.     Glendale Chard, DO [place]

## 2024-03-19 NOTE — Patient Instructions (Signed)
 It was great to see you today!   I am changing your blood pressure medication.  STOP taking the valsartan/Diovan 80 mg. START taking the new medication which is a combination of valsartan/amlodipine.  You have a follow-up scheduled in 2 weeks to recheck your blood pressure.  Future Appointments  Date Time Provider Department Center  04/08/2024  8:45 AM Fortunato Curling, DO [facility]  04/30/2024  8:30 AM Helane Gunther, DPM [facility]    Please arrive 15 minutes before your appointment to ensure smooth check in process.    Please call the clinic at [phone] if your symptoms worsen or you have any concerns.  Thank you for allowing me to participate in your care, Dr. Glendale Chard [facility]

## 2024-03-19 NOTE — Assessment & Plan Note (Signed)
 Discontinue valsartan 80 mg and start combination pill valsartan-amlodipine 160/5 mg daily.  Patient scheduled for follow-up in 2 weeks.  Patient is a risk for falls due to using a cane please ensure she is not hypotensive at the next visit.

## 2024-03-19 NOTE — Assessment & Plan Note (Signed)
 A1c collected today and decreased.  Instructed patient that she does not need to check her blood sugar daily.

## 2024-04-05 ENCOUNTER — Other Ambulatory Visit: Payer: Self-pay | Admitting: Cardiology

## 2024-04-06 NOTE — Assessment & Plan Note (Addendum)
 Patient's blood pressure is better controlled today. BP: (!) 142/88. Goal of 140/90. Patient's medication regimen includes Amlodipine -Valsartan  5-160 mg daily at her last visit and has continued Metoprolol  75 mg daily. - Changes to current regimen were not made, refills sent - Labs: BMP, will follow - Follow up in 4 weeks to titrate meds further if indicated - Patient is a risk for falls due to using a cane please ensure she is not hypotensive at the next visit.

## 2024-04-06 NOTE — Progress Notes (Signed)
   SUBJECTIVE:   CHIEF COMPLAINT / HPI: BP f/u  HTN Patient comes in today to check her BP after starting combo pill of amlodipine -valsartan  at her last visit in combo with her metoprolol  75 mg. Patient denies any symptoms of hypotension (dizziness, lightheadedness, weakness, or blurry vision). Patient is a risk for falls due to using a cane.  PERTINENT  PMH / PSH: DM, HLD, CAD  OBJECTIVE:   BP (!) 142/88   Pulse 64   Ht 5\' 3"  (1.6 m)   Wt 206 lb 9.6 oz (93.7 kg)   SpO2 100%   BMI 36.60 kg/m   General: Awake and Alert in NAD HEENT: NCAT. Sclera anicteric. No rhinorrhea. Cardiovascular: RRR. No M/R/G. 2+ radial pulses Respiratory: CTAB, normal WOB on RA. No wheezing, crackles, rhonchi, or diminished breath sounds. Abdomen: Soft, non-tender, non-distended. Bowel sounds normoactive Extremities: Able to move all extremities. No BLE edema, no deformities or significant joint findings. Skin: Warm and dry. No abrasions or rashes noted. Neuro: No focal neurological deficits.  ASSESSMENT/PLAN:   Assessment & Plan Essential hypertension, benign Patient's blood pressure is better controlled today. BP: (!) 142/88. Goal of 140/90. Patient's medication regimen includes Amlodipine -Valsartan  5-160 mg daily at her last visit and has continued Metoprolol  75 mg daily. - Changes to current regimen were not made, refills sent - Labs: BMP, will follow - Follow up in 4 weeks to titrate meds further if indicated - Patient is a risk for falls due to using a cane please ensure she is not hypotensive at the next visit.    Clyda Dark, DO [place] [facility]

## 2024-04-08 ENCOUNTER — Encounter: Payer: Self-pay | Admitting: Family Medicine

## 2024-04-08 ENCOUNTER — Ambulatory Visit (INDEPENDENT_AMBULATORY_CARE_PROVIDER_SITE_OTHER): Payer: Self-pay | Admitting: Family Medicine

## 2024-04-08 VITALS — BP 142/88 | HR 64 | Ht 63.0 in | Wt 206.6 lb

## 2024-04-08 DIAGNOSIS — I10 Essential (primary) hypertension: Secondary | ICD-10-CM | POA: Diagnosis not present

## 2024-04-08 MED ORDER — AMLODIPINE BESYLATE-VALSARTAN 5-160 MG PO TABS: 1.0000 | ORAL_TABLET | Freq: Every day | ORAL | 0 refills | Status: DC

## 2024-04-08 NOTE — Patient Instructions (Addendum)
 It was great to see you today! Thank you for choosing [facility] for your primary care. Meredith [name] was seen for blood pressure follow up.  Today we addressed: Blood pressure (Hypertension) We kept your blood pressure regimen the same for now since your repeat BP was better  Please follow up within 4 weeks to have your BP rechecked and medications adjusted/refilled as needed It would be best to check your blood pressure once in the morning and once at night at home to compare your numbers between the office and home, if able!  If you haven't already, sign up for My Chart to have easy access to your labs results, and communication with your primary care physician.  We are checking some labs today. If they are abnormal, I will call you. If they are normal, I will send you a MyChart message (if it is active) or a letter in the mail. If you do not hear about your labs in the next 2 weeks, please call the office. I recommend that you always bring your medications to each appointment as this makes it easy to ensure you are on the correct medications and helps us  not miss refills when you need them.  You should return to our clinic No follow-ups on file. Please arrive 15 minutes before your appointment to ensure smooth check in process.  We appreciate your efforts in making this happen.  Thank you for allowing me to participate in your care, Clyda Dark, DO 04/08/2024, 8:47 AM PGY-1, [facility]

## 2024-04-12 ENCOUNTER — Encounter: Payer: Self-pay | Admitting: Family Medicine

## 2024-04-12 LAB — BASIC METABOLIC PANEL WITH GFR
BUN/Creatinine Ratio: 23 (ref 12–28)
BUN: 19 mg/dL (ref 8–27)
CO2: 23 mmol/L (ref 20–29)
Calcium: 10.1 mg/dL (ref 8.7–10.3)
Chloride: 104 mmol/L (ref 96–106)
Creatinine, Ser: 0.81 mg/dL (ref 0.57–1.00)
Glucose: 92 mg/dL (ref 70–99)
Potassium: 4.5 mmol/L (ref 3.5–5.2)
Sodium: 143 mmol/L (ref 134–144)
eGFR: 76 mL/min/{1.73_m2} (ref >59)

## 2024-04-30 ENCOUNTER — Ambulatory Visit: Payer: Medicare HMO | Admitting: Podiatry

## 2024-04-30 ENCOUNTER — Encounter: Payer: Self-pay | Admitting: Podiatry

## 2024-04-30 DIAGNOSIS — B351 Tinea unguium: Secondary | ICD-10-CM

## 2024-04-30 DIAGNOSIS — E119 Type 2 diabetes mellitus without complications: Secondary | ICD-10-CM

## 2024-04-30 DIAGNOSIS — M79674 Pain in right toe(s): Secondary | ICD-10-CM

## 2024-04-30 DIAGNOSIS — M79675 Pain in left toe(s): Secondary | ICD-10-CM

## 2024-04-30 NOTE — Progress Notes (Signed)
This patient returns to my office for at risk foot care.  This patient requires this care by a professional since this patient will be at risk due to having diabetes.This patient is unable to cut nails herself since the patient cannot reach her nails.These nails are painful walking and wearing shoes.  This patient presents for at risk foot care today.  General Appearance  Alert, conversant and in no acute stress.  Vascular  Dorsalis pedis and posterior tibial  pulses are  weakly palpable  bilaterally.  Capillary return is within normal limits  bilaterally. Temperature is within normal limits  bilaterally.  Neurologic  Senn-Weinstein monofilament wire test within normal limits  bilaterally. Muscle power within normal limits bilaterally.  Nails Thick disfigured discolored nails with subungual debris  from hallux to fifth toes bilaterally. No evidence of bacterial infection or drainage bilaterally.  Orthopedic  No limitations of motion  feet .  No crepitus or effusions noted.  No bony pathology or digital deformities noted.  Skin  normotropic skin with no porokeratosis noted bilaterally.  No signs of infections or ulcers noted.     Onychomycosis  Pain in right toes  Pain in left toes  Consent was obtained for treatment procedures.   Mechanical debridement of nails 1-5  bilaterally performed with a nail nipper.  Filed with dremel without incident.    Return office visit   3 months                   Told patient to return for periodic foot care and evaluation due to potential at risk complications.   Gracelin Weisberg DPM  

## 2024-05-15 ENCOUNTER — Other Ambulatory Visit: Payer: Self-pay | Admitting: Family Medicine

## 2024-05-15 DIAGNOSIS — I10 Essential (primary) hypertension: Secondary | ICD-10-CM

## 2024-06-01 ENCOUNTER — Other Ambulatory Visit: Payer: Self-pay | Admitting: Student

## 2024-06-01 ENCOUNTER — Telehealth: Payer: Self-pay

## 2024-06-01 DIAGNOSIS — I10 Essential (primary) hypertension: Secondary | ICD-10-CM

## 2024-06-01 NOTE — Telephone Encounter (Signed)
 Patient calls nurse line regarding confusion with BP medications.   She reports that she picked up valsartan  and metoprolol  from pharmacy this morning.   Called to pharmacy and verified this information. Patient also picked up amlodipine - valsartan  on 05/19/24 for a 30 day supply.   She states that she is almost out of this combo pill. I asked patient if she had been taking daily or BID. She is unable to confirm as she does not have medication bottle with her.   Also, per previous chart review, valsartan  had been discontinued and replaced with amlodipine - valsartan  combo pill. Advised patient to not take additional valsartan .   Due to confusion with medications, I recommended that patient schedule appointment with pharmacy team for med reconciliation. Patient scheduled on Thursday morning with Dr. Koval.   Instructed patient to bring all medications to this appointment. Patient verbalizes understanding.   Chiquita JAYSON English, RN

## 2024-06-03 ENCOUNTER — Encounter: Payer: Self-pay | Admitting: Pharmacist

## 2024-06-03 ENCOUNTER — Other Ambulatory Visit: Payer: Self-pay | Admitting: Family Medicine

## 2024-06-03 ENCOUNTER — Ambulatory Visit (INDEPENDENT_AMBULATORY_CARE_PROVIDER_SITE_OTHER): Admitting: Pharmacist

## 2024-06-03 VITALS — BP 157/81 | HR 67 | Wt 212.0 lb

## 2024-06-03 DIAGNOSIS — I10 Essential (primary) hypertension: Secondary | ICD-10-CM

## 2024-06-03 DIAGNOSIS — L299 Pruritus, unspecified: Secondary | ICD-10-CM

## 2024-06-03 DIAGNOSIS — R21 Rash and other nonspecific skin eruption: Secondary | ICD-10-CM

## 2024-06-03 MED ORDER — SPIRONOLACTONE 25 MG PO TABS: 25.0000 mg | ORAL_TABLET | Freq: Every day | ORAL | 3 refills | Status: DC

## 2024-06-03 MED ORDER — EPLERENONE 25 MG PO TABS: 25.0000 mg | ORAL_TABLET | Freq: Every day | ORAL | 1 refills | Status: DC

## 2024-06-03 MED ORDER — FEXOFENADINE HCL 180 MG PO TABS: 180.0000 mg | ORAL_TABLET | Freq: Every day | ORAL | 3 refills | Status: DC | PRN

## 2024-06-03 NOTE — Assessment & Plan Note (Signed)
 Hypertension diagnosed > 30 years ago currently remains not at goal despite use of metoprolol , amlodipine  and valsartan .  BP goal < 140/90  or 130/80 mmHg if able to tolerate. Medication adherence appears good.  - Continue current therapy with metoprolol  75mg  once daily, Amlodipine /valsartan  5/160mg  daily -Started Spironolactone 25mg  once daily (attempted to start eplerenone but this was denied by insurance as non-preferred agent).  - STOPPED Potassium supplements  - Sent inappropriately dispensed valsartan  for destruction to minimize risk of confusion.

## 2024-06-03 NOTE — Progress Notes (Signed)
 S:     Chief Complaint  Patient presents with   Medication Management    Medication Review - Hypertension   73 y.o. female who presents for Medication Review and clarification with hypertension evaluation, education, and management.   Patient was referred and last seen by Primary Care Provider, Dr. Janna, on 05/15/2024.  At last interaction with clinic RN by phone, confusion related to most recent medication fill and clarification of treatment plan.   Today, patient arrives in good spirits and presents without assistance.  Patient reports hypertension was diagnosed in her 30s.   Medication adherence appears good . Patient reports taking blood pressure medications today.   Current antihypertensives include: metoprolol  75mg  daily (3X25mg  succinate), Amlodipine /valsartan  5/160mg  daily.   Reported home blood pressure readings: Systolic 150-160, diastolic > 85 routinely  Medication review: Appears patient has all medications.  She did have a misfill at her local pharmacy when she received valsartan  (single agent 80mg ) refill inappropriately recently as she is on amlodipine /valsartan  combination therapy.   Reports taking potassium supplement 20mEq daily for several years (without while not taking diuretic therapy since 2022 or 2023 - hydrochlorothiazide  in the past)  O:  Review of Systems  All other systems reviewed and are negative.   Physical Exam Vitals reviewed.  Constitutional:      Appearance: Normal appearance.  Pulmonary:     Effort: Pulmonary effort is normal.   Neurological:     Mental Status: She is alert.   Psychiatric:        Mood and Affect: Mood normal.        Thought Content: Thought content normal.        Judgment: Judgment normal.     Last 3 Office BP readings: BP Readings from Last 3 Encounters:  06/03/24 (!) 157/81  04/08/24 (!) 142/88  03/19/24 (!) 168/88    BMET    Component Value Date/Time   NA 143 04/08/2024 1144   K 4.5 04/08/2024 1144    CL 104 04/08/2024 1144   CO2 23 04/08/2024 1144   GLUCOSE 92 04/08/2024 1144   GLUCOSE 100 (H) 02/05/2024 0938   BUN 19 04/08/2024 1144   CREATININE 0.81 04/08/2024 1144   CREATININE 0.77 05/16/2016 0739   CALCIUM  10.1 04/08/2024 1144   GFRNONAA >60 02/05/2024 0938   GFRAA 75 07/13/2020 0840    Clinical ASCVD:  The 10-year ASCVD risk score (Arnett DK, et al., 2019) is: 49.9%   Values used to calculate the score:     Age: 30 years     Clincally relevant sex: Female     Is Non-Hispanic African American: Yes     Diabetic: Yes     Tobacco smoker: Yes     Systolic Blood Pressure: 157 mmHg     Is BP treated: Yes     HDL Cholesterol: 49 mg/dL     Total Cholesterol: 134 mg/dL  A/P: Hypertension diagnosed > 30 years ago currently remains not at goal despite use of metoprolol , amlodipine  and valsartan .  BP goal < 140/90  or 130/80 mmHg if able to tolerate. Medication adherence appears good.  - Continue current therapy with metoprolol  75mg  once daily, Amlodipine /valsartan  5/160mg  daily -Started Spironolactone 25mg  once daily (attempted to start eplerenone but this was denied by insurance as non-preferred agent).  - STOPPED Potassium supplements  - Sent inappropriately dispensed valsartan  for destruction to minimize risk of confusion.   Results reviewed and written information provided.   Refill provided for fexofenadine  as maintenance therapy  for allergic rhinitis.  Written patient instructions provided. Patient verbalized understanding of treatment plan.  Total time in face to face counseling 29 minutes.    Contacted pharmacy during and after visit to resolve issues including reporting mis-fill, eplerenone non-preferred and monitoring caution with [facility] - [facility].   Follow-up:  Pharmacist 2 weeks. PCP clinic visit in TBD

## 2024-06-03 NOTE — Patient Instructions (Signed)
 It was nice to see you today!  Your goal blood pressure is <130/80 mm Hg     Medication Changes: - START eplerenone 1 pill daily  - STOP Potassium pills  - Remember to take your Xarelto  with a meal  - Continue all other medication the same.  Keep up the good work with diet and exercise. Aim for a diet full of vegetables, fruit and lean meats (chicken, [place], fish). Try to limit salt intake by eating fresh or frozen vegetables (instead of canned), rinse canned vegetables prior to cooking and do not add any additional salt to meals.   Monitor blood pressure at home daily and keep a log (on your phone or piece of paper) to bring with you to your next visit. Write down date, time, blood pressure and pulse.   Please bring all medications to your clinic visits.  Please arrive 10-15 minutes prior to your scheduled visit time.

## 2024-06-07 NOTE — Progress Notes (Signed)
 Reviewed and agree with Dr Macky Lower plan.

## 2024-06-17 ENCOUNTER — Ambulatory Visit (INDEPENDENT_AMBULATORY_CARE_PROVIDER_SITE_OTHER): Admitting: Pharmacist

## 2024-06-17 ENCOUNTER — Encounter: Payer: Self-pay | Admitting: Pharmacist

## 2024-06-17 VITALS — BP 153/80 | HR 62 | Wt 208.4 lb

## 2024-06-17 DIAGNOSIS — I10 Essential (primary) hypertension: Secondary | ICD-10-CM

## 2024-06-17 MED ORDER — OLOPATADINE HCL 0.1 % OP SOLN: 1.0000 [drp] | Freq: Two times a day (BID) | OPHTHALMIC | 12 refills | Status: DC

## 2024-06-17 MED ORDER — INDAPAMIDE 1.25 MG PO TABS: 1.2500 mg | ORAL_TABLET | Freq: Every day | ORAL | 1 refills | Status: DC

## 2024-06-17 NOTE — Assessment & Plan Note (Signed)
 Hypertension diagnosed > 30 years ago currently remains higher than goal blood pressure despite use of metoprolol , amlodipine /valsartan  and spironolactone .  BP goal < 140/90  or 130/80 mmHg if able to tolerate. Medication adherence appears good.  Patient reports tolerating new agent without issue - Continue current therapy with metoprolol  75mg  once daily, Amlodipine /valsartan  5/160mg  daily and Spironolactone  25mg  once daily - Started indapamide  1.25mg  once daily in AM (alternative to hydrochlorothiazide  which was not tolerated in the past at 25mg  dose) - BMET today

## 2024-06-17 NOTE — Progress Notes (Signed)
 S:     Chief Complaint  Patient presents with   Medication Management    Blood Pressure    73 y.o. female who presents for hypertension evaluation, education, and management. Today, patient arrives in good spirits and presents with assistance of a cane. Reports occasional orthostatic dizziness and leg swelling from standing at her work.  Denies, headache, blurred vision.   Patient was referred and last seen by Primary Care Provider, Dr. Janna, on 04/08/2024.  At last visit, spironolactone  was initiated.    PMH is significant for Diabetes, Chronic Kidney Disease, AFib, Joint pain.   Patient reports hypertension was diagnosed in her 30s..   Medication adherence appears good - patient able to identify all pills and knows indications for use . Patient reports taking blood pressure medications today.   Current antihypertensives include: metoprolol  75mg  daily (3X25mg  succinate), Amlodipine /valsartan  5/160mg  daily, spironolactone  25mg  daily.   Antihypertensives tried in the past include: hydrochlorothiazide  (use at 25mg  resulted in hypokalemia and dizziness per chart)  Reported home blood pressure readings: 124-141 / 65-75  per patient - not documented.  Patient-reported exercise habits: limited due to knee pain  O:  Review of Systems  Musculoskeletal:  Positive for joint pain.  Neurological:  Positive for dizziness (occasional with positional changes). Negative for headaches.  All other systems reviewed and are negative.  Physical Exam Vitals reviewed.  Constitutional:      Appearance: Normal appearance.  Pulmonary:     Effort: Pulmonary effort is normal.  Musculoskeletal:     Right lower leg: Edema (1+) present.     Left lower leg: Edema (1+) present.  Neurological:     Mental Status: She is alert.  Psychiatric:        Mood and Affect: Mood normal.        Thought Content: Thought content normal.     Last 3 Office BP readings: BP Readings from Last 3 Encounters:   06/17/24 (!) 153/80  06/03/24 (!) 157/81  04/08/24 (!) 142/88    BMET    Component Value Date/Time   NA 143 04/08/2024 1144   K 4.5 04/08/2024 1144   CL 104 04/08/2024 1144   CO2 23 04/08/2024 1144   GLUCOSE 92 04/08/2024 1144   GLUCOSE 100 (H) 02/05/2024 0938   BUN 19 04/08/2024 1144   CREATININE 0.81 04/08/2024 1144   CREATININE 0.77 05/16/2016 0739   CALCIUM  10.1 04/08/2024 1144   GFRNONAA >60 02/05/2024 0938   GFRAA 75 07/13/2020 0840    Clinical ASCVD:  The 10-year ASCVD risk score (Arnett DK, et al., 2019) is: 28.3%   Values used to calculate the score:     Age: 64 years     Clincally relevant sex: Female     Is Non-Hispanic African American: Yes     Diabetic: Yes     Tobacco smoker: No     Systolic Blood Pressure: 153 mmHg     Is BP treated: Yes     HDL Cholesterol: 49 mg/dL     Total Cholesterol: 134 mg/dL   A/P: Hypertension diagnosed > 30 years ago currently remains higher than goal blood pressure despite use of metoprolol , amlodipine /valsartan  and spironolactone .  BP goal < 140/90  or 130/80 mmHg if able to tolerate. Medication adherence appears good.  Patient reports tolerating new agent without issue - Continue current therapy with metoprolol  75mg  once daily, Amlodipine /valsartan  5/160mg  daily and Spironolactone  25mg  once daily - Started indapamide  1.25mg  once daily in AM (alternative to hydrochlorothiazide  which  was not tolerated in the past at 25mg  dose) - BMET today   Results reviewed and written information provided.    Written patient instructions provided. Patient verbalized understanding of treatment plan.  Total time in face to face counseling 28 minutes.    Follow-up:  Pharmacist 07/15/2024. PCP clinic visit in TBD.

## 2024-06-17 NOTE — Patient Instructions (Addendum)
 It was nice to see you today!    Your goal blood pressure is <140/90 mmHg  Medication Changes: - START indapamide  1.25 mg once daily in the morning  - Continue all other medication the same  Keep up the good work with diet and exercise. Aim for a diet full of vegetables, fruit and lean meats (chicken, [place], fish). Try to limit salt intake by eating fresh or frozen vegetables (instead of canned), rinse canned vegetables prior to cooking and do not add any additional salt to meals.   Monitor blood pressure at home daily and keep a log (on your phone or piece of paper) to bring with you to your next visit. Write down date, time, blood pressure and pulse.   Please bring all medications to your clinic visits.  Please arrive 10-15 minutes prior to your scheduled visit time.

## 2024-06-18 LAB — BASIC METABOLIC PANEL WITH GFR
BUN/Creatinine Ratio: 20 (ref 12–28)
BUN: 19 mg/dL (ref 8–27)
CO2: 23 mmol/L (ref 20–29)
Calcium: 10.4 mg/dL — ABNORMAL HIGH (ref 8.7–10.3)
Chloride: 104 mmol/L (ref 96–106)
Creatinine, Ser: 0.94 mg/dL (ref 0.57–1.00)
Glucose: 109 mg/dL — ABNORMAL HIGH (ref 70–99)
Potassium: 4.1 mmol/L (ref 3.5–5.2)
Sodium: 143 mmol/L (ref 134–144)
eGFR: 64 mL/min/1.73 (ref >59)

## 2024-06-18 NOTE — Progress Notes (Signed)
 Reviewed and agree with Dr Macky Lower plan.

## 2024-06-23 ENCOUNTER — Telehealth: Payer: Self-pay | Admitting: Pharmacist

## 2024-06-23 NOTE — Telephone Encounter (Signed)
 Attempted to contact patient for follow-up of lab result sharing   No significant change - calcium  out of range but only slightly abnormal which it has been in the past ~ 2 years ago   Unable to leave message - mailbox is full  Total time with patient call and documentation of interaction: 5 minutes. MyChart is not in use.

## 2024-06-24 NOTE — Telephone Encounter (Signed)
 Patient returns call to nurse line.   Results discussed with patient. Advised elevated Calcium , however reported very slightly and this is not new for her.   She would like to know how she can improve this.   Spoke with Dr. Koval. He reports no significance in her levels. He reports some patients trend on the higher side.   Patient advised and was appreciative.

## 2024-07-11 ENCOUNTER — Other Ambulatory Visit: Payer: Self-pay | Admitting: Family Medicine

## 2024-07-11 DIAGNOSIS — I10 Essential (primary) hypertension: Secondary | ICD-10-CM

## 2024-07-15 ENCOUNTER — Ambulatory Visit: Payer: Self-pay | Admitting: Pharmacist

## 2024-07-15 ENCOUNTER — Encounter: Payer: Self-pay | Admitting: Pharmacist

## 2024-07-15 VITALS — BP 145/77 | HR 57 | Wt 205.2 lb

## 2024-07-15 DIAGNOSIS — I10 Essential (primary) hypertension: Secondary | ICD-10-CM

## 2024-07-15 MED ORDER — SPIRONOLACTONE 50 MG PO TABS: 50.0000 mg | ORAL_TABLET | Freq: Every day | ORAL | 3 refills | Status: DC

## 2024-07-15 NOTE — Progress Notes (Signed)
 S:     Chief Complaint  Patient presents with   Medication Management    Bp f/u   73 y.o. female who presents for hypertension evaluation, education, and management.   Patient was referred and last seen by Primary Care Provider, Dr. Janna, on 04/08/24.  At last visit, started on indapamide  1.25 mg once daily to improve blood pressure control.   PMH is significant for hypertension, obesity, diabetes, and Hyperlipidemia.   Today, patient arrives in good spirits and presents with assistance of a cane. Denies dizziness, headache, blurred vision, swelling.   Patient reports hypertension was diagnosed in 02/05/2007   Medication adherence: Occasionally forgets to take medicine but reports taking when she remembers. Patient reports taking blood pressure medications today.   Current antihypertensives include:  amlodipine /valsartan  5-160 mg: 1 tablet by mouth daily indapamide  1.25 mg: 1 tablet by mouth daily  metoprolol  succinate 25 mg: 3 tablets by mouth daily Spironolactone  25 mg: 1 tablet by mouth daily  Antihypertensives tried in the past include: Lisinopril  (allergy)  Reported home blood pressure readings: still bouncing when checking readings lowest 121 and highest ~150   Patient reports still receiving potassium from pharmacy but has not been taking it. O:  Review of Systems  Musculoskeletal:  Positive for joint pain.  All other systems reviewed and are negative.   Physical Exam Constitutional:      Appearance: Normal appearance.  Pulmonary:     Effort: Pulmonary effort is normal.  Neurological:     Mental Status: She is alert.  Psychiatric:        Mood and Affect: Mood normal.        Behavior: Behavior normal.        Thought Content: Thought content normal.        Judgment: Judgment normal.     Last 3 Office BP readings: BP Readings from Last 3 Encounters:  07/15/24 (!) 145/77  06/17/24 (!) 153/80  06/03/24 (!) 157/81    BMET    Component Value Date/Time    NA 143 06/17/2024 0913   K 4.1 06/17/2024 0913   CL 104 06/17/2024 0913   CO2 23 06/17/2024 0913   GLUCOSE 109 (H) 06/17/2024 0913   GLUCOSE 100 (H) 02/05/2024 0938   BUN 19 06/17/2024 0913   CREATININE 0.94 06/17/2024 0913   CREATININE 0.77 05/16/2016 0739   CALCIUM  10.4 (H) 06/17/2024 0913   GFRNONAA >60 02/05/2024 0938   GFRAA 75 07/13/2020 0840    Renal function: CrCl cannot be calculated (Patient's most recent lab result is older than the maximum 21 days allowed.).  Clinical ASCVD: Yes  The 10-year ASCVD risk score (Arnett DK, et al., 2019) is: 26.3%   Values used to calculate the score:     Age: 36 years     Clincally relevant sex: Female     Is Non-Hispanic African American: Yes     Diabetic: Yes     Tobacco smoker: No     Systolic Blood Pressure: 145 mmHg     Is BP treated: Yes     HDL Cholesterol: 49 mg/dL     Total Cholesterol: 134 mg/dL  A/P: Hypertension diagnosed, currently improving on current medications. BP goal < 140/90 mmHg and lower if patient can tolerate. Medication adherence appears intermittent but given a pill box to help. -Increased dose of spironolactone  25 mg to 50 mg once daily at night.  -Patient told to discontinue K-Dur and pharmacy contacted to stop fills. -Continued all other medications  as directed.  -Patient educated on purpose, proper use, and potential adverse effects.  -F/u labs ordered - BMET (07/15/24) -Counseled on lifestyle modifications for blood pressure control including reduced dietary sodium, increased exercise, adequate sleep. -Encouraged patient to continue to check BP at home and bring log of readings to next visit. Counseled on proper use of home BP cuff.   Results reviewed and written information provided.    Written patient instructions provided. Patient verbalized understanding of treatment plan.  Total time in face to face counseling 15 minutes.    Follow-up:  Pharmacist 08/19/2024 @ 8:30 am. Patient seen with  Fonda Blase, PharmD Candidate - PY3 student and Calton Nash, PharmD Candidate - PY4 student.

## 2024-07-15 NOTE — Patient Instructions (Signed)
 It was nice to see you today! Enjoy your pill box!   Your goal blood pressure is <140/90 mmHg.  Medication Changes:  - Increase spironolactone  25 mg to 50 mg (continue to take supply of 25 mg, 2 tablets at time until finished then pick up spironolactone  50 mg from pharmacy and take 1 daily).  - Continue all other medication the same.  Keep up the good work with diet and exercise. Aim for a diet full of vegetables, fruit and lean meats (chicken, [place], fish). Try to limit salt intake by eating fresh or frozen vegetables (instead of canned), rinse canned vegetables prior to cooking and do not add any additional salt to meals.   Monitor blood pressure at home daily and keep a log (on your phone or piece of paper) to bring with you to your next visit. Write down date, time, blood pressure and pulse.   Please bring all medications to your clinic visits.  Please arrive 10-15 minutes prior to your scheduled visit time.

## 2024-07-15 NOTE — Assessment & Plan Note (Addendum)
 Hypertension diagnosed, currently improving on current medications. BP goal < 140/90 mmHg and lower if patient can tolerate. Medication adherence appears intermittent but given a pill box to help. -Increased dose of spironolactone  25 mg to 50 mg once daily at night.  -Patient told to discontinue K-Dur and pharmacy contacted to stop fills. -Continued all other medications as directed.  -Patient educated on purpose, proper use, and potential adverse effects.  -F/u labs ordered - BMET (07/15/24) -Counseled on lifestyle modifications for blood pressure control including reduced dietary sodium, increased exercise, adequate sleep. -Encouraged patient to continue to check BP at home and bring log of readings to next visit. Counseled on proper use of home BP cuff.

## 2024-07-16 ENCOUNTER — Telehealth: Payer: Self-pay | Admitting: Pharmacist

## 2024-07-16 LAB — BASIC METABOLIC PANEL WITH GFR
BUN/Creatinine Ratio: 18 (ref 12–28)
BUN: 17 mg/dL (ref 8–27)
CO2: 25 mmol/L (ref 20–29)
Calcium: 10.7 mg/dL — ABNORMAL HIGH (ref 8.7–10.3)
Chloride: 101 mmol/L (ref 96–106)
Creatinine, Ser: 0.96 mg/dL (ref 0.57–1.00)
Glucose: 140 mg/dL — ABNORMAL HIGH (ref 70–99)
Potassium: 3.8 mmol/L (ref 3.5–5.2)
Sodium: 143 mmol/L (ref 134–144)
eGFR: 62 mL/min/1.73 (ref >59)

## 2024-07-16 NOTE — Telephone Encounter (Signed)
 Reviewed and agree with Dr Macky Lower plan.

## 2024-07-16 NOTE — Telephone Encounter (Signed)
 Patient contacted for follow-up of lab results.   Since last contact patient reports she has all of her medications.   I shared that her labs were stable/unchanged and not concerning  Continue current treatment plan.   Total time with patient call and documentation of interaction: 8 minutes.

## 2024-08-06 ENCOUNTER — Ambulatory Visit: Admitting: Podiatry

## 2024-08-19 ENCOUNTER — Encounter: Payer: Self-pay | Admitting: Pharmacist

## 2024-08-19 ENCOUNTER — Ambulatory Visit: Admitting: Pharmacist

## 2024-08-19 ENCOUNTER — Ambulatory Visit: Admitting: Student

## 2024-08-19 VITALS — BP 162/70 | HR 73 | Ht 63.0 in | Wt 197.0 lb

## 2024-08-19 VITALS — BP 162/70 | HR 73 | Wt 197.8 lb

## 2024-08-19 DIAGNOSIS — G5601 Carpal tunnel syndrome, right upper limb: Secondary | ICD-10-CM | POA: Diagnosis not present

## 2024-08-19 DIAGNOSIS — M545 Low back pain, unspecified: Secondary | ICD-10-CM

## 2024-08-19 DIAGNOSIS — I10 Essential (primary) hypertension: Secondary | ICD-10-CM

## 2024-08-19 DIAGNOSIS — G8929 Other chronic pain: Secondary | ICD-10-CM | POA: Diagnosis not present

## 2024-08-19 MED ORDER — METOPROLOL SUCCINATE ER 25 MG PO TB24
75.0000 mg | ORAL_TABLET | Freq: Every day | ORAL | 3 refills | Status: DC
Start: 2024-08-19 — End: 2024-09-08

## 2024-08-19 MED ORDER — TIZANIDINE HCL 4 MG PO TABS: 4.0000 mg | ORAL_TABLET | Freq: Every day | ORAL | 0 refills | Status: DC

## 2024-08-19 NOTE — Progress Notes (Signed)
   SUBJECTIVE:   CHIEF COMPLAINT / HPI:   BACK PAIN History of chronic back pain.  Acute on chronic pain began last week.  Described as sharp pain in right lower back, without radiation.  Denies history of trauma, but did almost fall a week ago and caught herself-but she is not sure if her back pain started then.  No history of cancer or immunodeficiency.  No incontinence, numbness of leg, fevers.  Pain does occur at night which makes it hard to sleep, but she is not waking up from sleep because of pain.  She has lost 8 pounds since her last visit in August, weight was stable prior to this, and she is not trying to lose weight.  No rash.  Wrist pain Ongoing numbness and tingling of her digits 1-3 on right.  Previously seen in our clinic on 10/2021 and diagnosed with carpal tunnel.  She was told to wear brace, she has not been doing this.  PERTINENT  PMH / PSH: HTN, T2DM, CKD  OBJECTIVE:   There were no vitals taken for this visit.   General: NAD, pleasant Cardio: Irregularly irregular (known A-fib) Respiratory: CTAB, normal wob on RA Right wrist: Full range of motion, +5 strength.  Tinel's positive. Back: No gross forming, no ecchymosis, no swelling.  No rash.  No TTP.  Hypertonic lumbar paraspinal muscles on right.  Normal sensation.  Straight leg negative.  Ober's negative, strong abductor muscles.  ASSESSMENT/PLAN:   Assessment & Plan Chronic bilateral low back pain without sciatica Acute on chronic low back pain, suspect muscle strain.  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  On exam pain is on lumbar paraspinal muscle to the right, with hypertonicity. - Trial tizanidine  4 mg at night - Referral to PT - If symptoms of radiculopathy develop, consider repeat lumbar films Carpal tunnel syndrome of right wrist Previously diagnosed and told to wear brace, not wearing brace. - Trial brace at night - Counseled on activity  modifications - Given referral to PT for problem above, will also send to PT for nerve/tendon gliding techniques - If not improving, could consider gabapentin  v.  Referral to sports medicine for injections   Follow-up recommendations Patient does have unintentional weight loss over the last month, ~4% recommend follow-up to recheck weight.  If weight loss reaches more than 5%, recommend workup for unintentional weight loss.   Meredith Church, DO [facility]

## 2024-08-19 NOTE — Patient Instructions (Signed)
 It was nice to see you today! We'll update you on the results of your labs. Hope you feel better soon!  Your goal blood pressure is  <130/80 mm Hg     Medication Changes: - Restart Amlodipine /valsartan  5-160 mg 1 tablet daily  - Discontinue Indapamide  1.25 mg   - Continue all other medication the same.  Keep up the good work with diet and exercise. Aim for a diet full of vegetables, fruit and lean meats (chicken, [place], fish). Try to limit salt intake by eating fresh or frozen vegetables (instead of canned), rinse canned vegetables prior to cooking and do not add any additional salt to meals.   Monitor blood pressure at home daily and keep a log (on your phone or piece of paper) to bring with you to your next visit. Write down date, time, blood pressure and pulse.   Please bring all medications to your clinic visits.  Please arrive 10-15 minutes prior to your scheduled visit time.

## 2024-08-19 NOTE — Patient Instructions (Signed)
 It was great to see you! Thank you for allowing me to participate in your care!   I recommend that you always bring your medications to each appointment as this makes it easy to ensure we are on the correct medications and helps us  not miss when refills are needed.  Our plans for today:  - Please take 4 mg tizanidine  nightly for back pain - I have sent referral to physical therapy, they will call you - I recommend you buy a wrist brace, wear it at night to help reduce carpal tunnel syndromes. You can also wear during the day when you do activities to help reduce pain. You can buy these online, pharmacy or even [facility].    Take care and seek immediate care sooner if you develop any concerns. Please remember to show up 15 minutes before your scheduled appointment time!  Gladis Church, DO [facility]

## 2024-08-19 NOTE — Assessment & Plan Note (Signed)
 Acute on chronic low back pain, suspect muscle strain.  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  On exam pain is on lumbar paraspinal muscle to the right, with hypertonicity. - Trial tizanidine  4 mg at night - Referral to PT - If symptoms of radiculopathy develop, consider repeat lumbar films

## 2024-08-19 NOTE — Assessment & Plan Note (Signed)
 Previously diagnosed and told to wear brace, not wearing brace. - Trial brace at night - Counseled on activity modifications - Given referral to PT for problem above, will also send to PT for nerve/tendon gliding techniques - If not improving, could consider gabapentin  v.  Referral to sports medicine for injections

## 2024-08-19 NOTE — Progress Notes (Signed)
 S:     Chief Complaint  Patient presents with   Medication Management    Blood Pressure f/u   73 y.o. female who presents for hypertension evaluation, education, and management. Presents with pains in the hip/back and walking with assistance of cane.  Patient was referred and last seen by Primary Care Provider, Dr. Janna, on 04/08/24.  At last visit with me, Spironolactone  was increased to 50 mg nightly and potassium supplement was stopped.   PMH is significant for hypertension, T2DM, CAD.   Today, patient arrives in fair spirits and presents with assistance of cane.  Denies dizziness, headache, blurred vision, swelling.   Per chart, hypertension was diagnosed in 2008.   Reports confusion on continued weight loss. Reporting numbness in three fingers on the right hand.   Medication adherence appears poor. Patient reports taking most medications today. Reports fear of taking multiple blood pressure pills: not taking amlodipine /valsartan  5-160 mg and not taking indapamide  1.25 mg for multiple days  Current antihypertensives include: Amlodipine /valsartan  5-160 mg (not taking), Indapamide  1.25 mg (not taking), Reports taking Spironolactone  50 mg, Metoprolol  succinate 25 mg 3 tablets daily    Reported home blood pressure readings:  8/14 125/92 8/15 134/87 8/18 120/71 8/21 128/75 8/22 132/83 139/96 130/81 8/24 154/82 8/25 143/81 8/31 146/87 9/01 154/89 147/78 9/05 155/95  Patient reported dietary habits: Eating less than normal (contributing to the weight loss)  Patient-reported exercise habits: cleaning  Evaluation of patient's pain and numbness was deferred to Dr. Howell.  Seen as work-in, access to care visit.   O:  Review of Systems  Musculoskeletal:  Positive for back pain and joint pain (new hip and leg pain).  Neurological:  Positive for sensory change (right hand numbness).  All other systems reviewed and are negative.  Physical Exam Constitutional:       Appearance: Normal appearance.  Pulmonary:     Effort: Pulmonary effort is normal.  Neurological:     Mental Status: She is alert.  Psychiatric:        Mood and Affect: Mood normal.        Behavior: Behavior normal.        Thought Content: Thought content normal.        Judgment: Judgment normal.    Last 3 Office BP readings: BP Readings from Last 3 Encounters:  08/19/24 (!) 162/70  08/19/24 (!) 162/70  07/15/24 (!) 145/77   Today's Vitals   08/19/24 0850 08/19/24 0901  BP: (!) 140/70 (!) 162/70  Pulse: 73   SpO2: 97%   Weight: 197 lb 12.8 oz (89.7 kg)    Body mass index is 35.04 kg/m.   BMET    Component Value Date/Time   NA 143 07/15/2024 1104   K 3.8 07/15/2024 1104   CL 101 07/15/2024 1104   CO2 25 07/15/2024 1104   GLUCOSE 140 (H) 07/15/2024 1104   GLUCOSE 100 (H) 02/05/2024 0938   BUN 17 07/15/2024 1104   CREATININE 0.96 07/15/2024 1104   CREATININE 0.77 05/16/2016 0739   CALCIUM  10.7 (H) 07/15/2024 1104   GFRNONAA >60 02/05/2024 0938   GFRAA 75 07/13/2020 0840    Clinical ASCVD: Yes  The 10-year ASCVD risk score (Arnett DK, et al., 2019) is: 30.5%   Values used to calculate the score:     Age: 32 years     Clincally relevant sex: Female     Is Non-Hispanic African American: Yes     Diabetic: Yes  Tobacco smoker: No     Systolic Blood Pressure: 162 mmHg     Is BP treated: Yes     HDL Cholesterol: 49 mg/dL     Total Cholesterol: 134 mg/dL   A/P: Hypertension longstanding currently uncontrolled on current medications. BP goal < 130/80 mmHg. Medication adherence appears intermittent. Control is suboptimal due to adherence.  - Discontinued Indapamide  1.25 mg (reports not taking for multiple days) - Restarted Amlodipine /valsartan  5-160 mg  - Continued Metoprolol  succinate 25 mg 3 tablets daily - Continued spironolactone  50mg  daily -F/u labs ordered - BMET -Patient educated on purpose, proper use, and potential adverse effects.  -Counseled on  lifestyle modifications for blood pressure control including reduced dietary sodium, increased exercise, adequate sleep. -Encouraged patient to check BP at home and bring log of readings to next visit. Counseled on proper use of home BP cuff.   Results reviewed and written information provided.    Written patient instructions provided. Patient verbalized understanding of treatment plan.  Total time in face to face counseling 58 minutes.    Follow-up:  PCP clinic visit with Dr CHARLENA Pinal on 09/01/24.  Patient seen with Dr. Howell, MD, Fonda Blase, PharmD Candidate - PY3 student and Estefana Blase, PharmD Candidate - PY4 student.

## 2024-08-19 NOTE — Assessment & Plan Note (Addendum)
 Hypertension longstanding currently uncontrolled on current medications. BP goal < 130/80 mmHg. Medication adherence appears intermittent. Control is suboptimal due to adherence.  - Discontinued Indapamide  1.25 mg (reports not taking for multiple days) - Restarted amlodipine /valsartan  5-160 mg  - Continued metoprolol  succinate 25 mg 3 tablets daily - Continued spironolactone  50mg  daily -F/u labs ordered - BMET -Patient educated on purpose, proper use, and potential adverse effects.  -Counseled on lifestyle modifications for blood pressure control including reduced dietary sodium, increased exercise, adequate sleep. -Encouraged patient to check BP at home and bring log of readings to next visit. Counseled on proper use of home BP cuff.

## 2024-08-20 ENCOUNTER — Telehealth: Payer: Self-pay | Admitting: Pharmacist

## 2024-08-20 LAB — BASIC METABOLIC PANEL WITH GFR
BUN/Creatinine Ratio: 15 (ref 12–28)
BUN: 14 mg/dL (ref 8–27)
CO2: 24 mmol/L (ref 20–29)
Calcium: 9.9 mg/dL (ref 8.7–10.3)
Chloride: 103 mmol/L (ref 96–106)
Creatinine, Ser: 0.91 mg/dL (ref 0.57–1.00)
Glucose: 128 mg/dL — ABNORMAL HIGH (ref 70–99)
Potassium: 3.3 mmol/L — ABNORMAL LOW (ref 3.5–5.2)
Sodium: 144 mmol/L (ref 134–144)
eGFR: 66 mL/min/1.73 (ref >59)

## 2024-08-20 MED ORDER — POTASSIUM CHLORIDE CRYS ER 10 MEQ PO TBCR: 10.0000 meq | EXTENDED_RELEASE_TABLET | Freq: Every day | ORAL | Status: DC

## 2024-08-20 NOTE — Telephone Encounter (Signed)
 Reviewed and agree with Dr Rennis plan.

## 2024-08-20 NOTE — Progress Notes (Signed)
 Reviewed and agree with Dr Rennis plan.

## 2024-08-20 NOTE — Telephone Encounter (Signed)
 Patient contacted for follow-up of lab results  Shared that her potassium was low at 3.3 - slightly lower than our normal / target range.  We also discussed that restarting her amlodipine /valsartan  should help correct this low reading.  For the next week, I asked her to restart potassium chloride  10 meQ once daily for 7 days She verified that she had a supply of this remaining in her possession.   I verified she had NOT taken any indapamide  today (as this was discontinued yesterday)   Anticipate valsartan  plus spironolactone  should be adequate to maintain potassium after repletion.  Plan to recheck BMET at PCP visit 9/24 Total time with patient call and documentation of interaction: 11 minutes.

## 2024-08-26 ENCOUNTER — Other Ambulatory Visit: Payer: Self-pay | Admitting: Student

## 2024-08-26 DIAGNOSIS — E119 Type 2 diabetes mellitus without complications: Secondary | ICD-10-CM

## 2024-08-29 ENCOUNTER — Encounter (HOSPITAL_BASED_OUTPATIENT_CLINIC_OR_DEPARTMENT_OTHER): Payer: Self-pay | Admitting: Emergency Medicine

## 2024-08-29 ENCOUNTER — Emergency Department (HOSPITAL_BASED_OUTPATIENT_CLINIC_OR_DEPARTMENT_OTHER)

## 2024-08-29 ENCOUNTER — Emergency Department (HOSPITAL_BASED_OUTPATIENT_CLINIC_OR_DEPARTMENT_OTHER)
Admission: EM | Admit: 2024-08-29 | Discharge: 2024-08-29 | Disposition: A | Discharge status: Home / Self Care | Attending: Emergency Medicine | Admitting: Emergency Medicine

## 2024-08-29 DIAGNOSIS — N281 Cyst of kidney, acquired: Secondary | ICD-10-CM | POA: Diagnosis not present

## 2024-08-29 DIAGNOSIS — I2510 Atherosclerotic heart disease of native coronary artery without angina pectoris: Secondary | ICD-10-CM | POA: Diagnosis not present

## 2024-08-29 DIAGNOSIS — R634 Abnormal weight loss: Secondary | ICD-10-CM | POA: Diagnosis not present

## 2024-08-29 DIAGNOSIS — Z79899 Other long term (current) drug therapy: Secondary | ICD-10-CM | POA: Diagnosis not present

## 2024-08-29 DIAGNOSIS — R109 Unspecified abdominal pain: Secondary | ICD-10-CM | POA: Diagnosis not present

## 2024-08-29 DIAGNOSIS — I10 Essential (primary) hypertension: Secondary | ICD-10-CM | POA: Diagnosis not present

## 2024-08-29 DIAGNOSIS — Z7984 Long term (current) use of oral hypoglycemic drugs: Secondary | ICD-10-CM | POA: Diagnosis not present

## 2024-08-29 DIAGNOSIS — R630 Anorexia: Secondary | ICD-10-CM | POA: Insufficient documentation

## 2024-08-29 DIAGNOSIS — E119 Type 2 diabetes mellitus without complications: Secondary | ICD-10-CM | POA: Diagnosis not present

## 2024-08-29 DIAGNOSIS — Z7901 Long term (current) use of anticoagulants: Secondary | ICD-10-CM | POA: Diagnosis not present

## 2024-08-29 LAB — CBC WITH DIFFERENTIAL/PLATELET
Abs Immature Granulocytes: 0.02 K/uL (ref 0.00–0.07)
Basophils Absolute: 0 K/uL (ref 0.0–0.1)
Basophils Relative: 1 %
Eosinophils Absolute: 0.1 K/uL (ref 0.0–0.5)
Eosinophils Relative: 2 %
HCT: 34.7 % — ABNORMAL LOW (ref 36.0–46.0)
Hemoglobin: 11.7 g/dL — ABNORMAL LOW (ref 12.0–15.0)
Immature Granulocytes: 0 %
Lymphocytes Relative: 27 %
Lymphs Abs: 1.5 K/uL (ref 0.7–4.0)
MCH: 25.5 pg — ABNORMAL LOW (ref 26.0–34.0)
MCHC: 33.7 g/dL (ref 30.0–36.0)
MCV: 75.6 fL — ABNORMAL LOW (ref 80.0–100.0)
Monocytes Absolute: 0.5 K/uL (ref 0.1–1.0)
Monocytes Relative: 9 %
Neutro Abs: 3.6 K/uL (ref 1.7–7.7)
Neutrophils Relative %: 61 %
Platelets: 225 K/uL (ref 150–400)
RBC: 4.59 MIL/uL (ref 3.87–5.11)
RDW: 15 % (ref 11.5–15.5)
WBC: 5.8 K/uL (ref 4.0–10.5)
nRBC: 0 % (ref 0.0–0.2)

## 2024-08-29 LAB — URINALYSIS, ROUTINE W REFLEX MICROSCOPIC
Bilirubin Urine: NEGATIVE
Glucose, UA: >1000 mg/dL — AB
Hgb urine dipstick: NEGATIVE
Leukocytes,Ua: NEGATIVE
Nitrite: NEGATIVE
Specific Gravity, Urine: 1.039 — ABNORMAL HIGH (ref 1.005–1.030)
pH: 5.5 (ref 5.0–8.0)

## 2024-08-29 LAB — LIPASE, BLOOD: Lipase: 45 U/L (ref 11–51)

## 2024-08-29 LAB — COMPREHENSIVE METABOLIC PANEL WITH GFR
ALT: 14 U/L (ref 0–44)
AST: 23 U/L (ref 15–41)
Albumin: 4.3 g/dL (ref 3.5–5.0)
Alkaline Phosphatase: 43 U/L (ref 38–126)
Anion gap: 14 (ref 5–15)
BUN: 19 mg/dL (ref 8–23)
CO2: 25 mmol/L (ref 22–32)
Calcium: 10.2 mg/dL (ref 8.9–10.3)
Chloride: 105 mmol/L (ref 98–111)
Creatinine, Ser: 1.02 mg/dL — ABNORMAL HIGH (ref 0.44–1.00)
GFR, Estimated: 57 mL/min — ABNORMAL LOW (ref >60)
Glucose, Bld: 113 mg/dL — ABNORMAL HIGH (ref 70–99)
Potassium: 3.3 mmol/L — ABNORMAL LOW (ref 3.5–5.1)
Sodium: 144 mmol/L (ref 135–145)
Total Bilirubin: 0.6 mg/dL (ref 0.0–1.2)
Total Protein: 7.4 g/dL (ref 6.5–8.1)

## 2024-08-29 LAB — MAGNESIUM: Magnesium: 1.5 mg/dL — ABNORMAL LOW (ref 1.7–2.4)

## 2024-08-29 MED ORDER — IOHEXOL 300 MG/ML  SOLN
100.0000 mL | Freq: Once | INTRAMUSCULAR | Status: AC | PRN
  Administered 2024-08-29: 100 mL via INTRAVENOUS

## 2024-08-29 MED ORDER — ONDANSETRON 4 MG PO TBDP
8.0000 mg | ORAL_TABLET | Freq: Once | ORAL | Status: AC
  Administered 2024-08-29: 8 mg via ORAL
  Filled 2024-08-29: qty 2

## 2024-08-29 NOTE — ED Provider Notes (Signed)
 [place] EMERGENCY DEPARTMENT AT [facility] Provider Note   CSN: [id number] Arrival date & time: 08/29/24  1710     Patient presents with: Abdominal Pain   Meredith [name] is a 73 y.o. female.   Patient is a 73 year old female who presents with abdominal discomfort and decreased appetite.  She has a history of diabetes, hypertension, hyperlipidemia, coronary artery disease.  She states that she has had decreased appetite since July.  She says her stomach is continuously gurgling.  She has some nausea but no vomiting.  The triage note indicated that she had had vomiting for 2 weeks although she denies any vomiting to me.  She denies any specific area of pain.  She said she just cannot eat much because she does not have an appetite and she has lost weight although she does not know how much she has lost.  She denies any urinary symptoms.  No cough or cold symptoms.  No chest pain or shortness of breath.  No blood in her stool or melena.  She denies any definite constipation but she feels like she has had some decreased bowel movements related to her not eating much.       Prior to Admission medications   Medication Sig Start Date End Date Taking? Authorizing Provider  ACCU-CHEK AVIVA PLUS test strip USE AS INSTRUCTED TEST BLOOD GLUCOSE ONCE DAILY. ICD-10 CODE: E11.9 08/26/24   Cleotilde Perkins, DO  Accu-Chek Softclix Lancets lancets Use as instructed to test blood glucose once daily. ICD-10 code: E11.9 10/27/20   Macario Dorothyann HERO, MD  acetaminophen  (ACETAMINOPHEN  8 HOUR) 650 MG CR tablet Take 1 tablet (650 mg total) by mouth every 8 (eight) hours. 10/22/18   Mesner, Selinda, MD  amLODipine -valsartan  (EXFORGE ) 5-160 MG tablet TAKE 1 TABLET BY MOUTH EVERY DAY 06/01/24   Cleotilde Perkins, DO  Bempedoic Acid-Ezetimibe  (NEXLIZET ) 180-10 MG TABS Take 1 tablet by mouth daily. 02/13/24   Shlomo Wilbert SAUNDERS, MD  Blood Glucose Monitoring Suppl (ACCU-CHEK AVIVA PLUS) w/Device KIT 1 kit by Does not  apply route as directed. ICD-10 code: E11.9 09/17/17   McDiarmid, Krystal BIRCH, MD  diclofenac  sodium (VOLTAREN ) 1 % GEL Apply 2 g topically 4 (four) times daily. 08/27/19   Benjamine Hamilton, DO  fexofenadine  (ALLEGRA ) 180 MG tablet Take 1 tablet (180 mg total) by mouth daily as needed for allergies. 06/03/24   McDiarmid, Krystal BIRCH, MD  INVOKANA  100 MG TABS tablet TAKE 1 TABLET BY MOUTH DAILY BEFORE BREAKFAST. 02/11/24   Cleotilde Perkins, DO  metFORMIN  (GLUCOPHAGE ) 1000 MG tablet TAKE 1 TABLET BY MOUTH TWICE A DAY WITH FOOD 12/30/23   Cleotilde Perkins, DO  metoprolol  succinate (TOPROL -XL) 25 MG 24 hr tablet Take 3 tablets (75 mg total) by mouth daily. Take with or immediately following a meal. 08/19/24   McDiarmid, Krystal BIRCH, MD  mineral oil-hydrophilic petrolatum (AQUAPHOR) ointment Apply topically as needed for dry skin. Patient not taking: Reported on 06/17/2024 04/11/22   Hope Merle, MD  olopatadine  (PATADAY ) 0.1 % ophthalmic solution Place 1 drop into both eyes 2 (two) times daily. 06/17/24   McDiarmid, Krystal BIRCH, MD  potassium chloride  (KLOR-CON  M) 10 MEQ tablet Take 1 tablet (10 mEq total) by mouth daily. For 7 days. 08/20/24   McDiarmid, Krystal BIRCH, MD  rivaroxaban  (XARELTO ) 20 MG TABS tablet Take 1 tablet (20 mg total) by mouth daily. 02/13/24   Shlomo Wilbert SAUNDERS, MD  rosuvastatin  (CRESTOR ) 40 MG tablet Take 1 tablet (40 mg total) by mouth  daily. 02/13/24   Shlomo Wilbert SAUNDERS, MD  spironolactone  (ALDACTONE ) 50 MG tablet Take 1 tablet (50 mg total) by mouth at bedtime. 07/15/24   McDiarmid, Krystal BIRCH, MD  tiZANidine  (ZANAFLEX ) 4 MG tablet Take 1 tablet (4 mg total) by mouth at bedtime. 08/19/24   Howell Lunger, DO    Allergies: Lisinopril , Doxycycline , Flexeril [cyclobenzaprine hcl], Lipitor [atorvastatin calcium ], and Metaxalone    Review of Systems  Constitutional:  Positive for appetite change, fatigue and unexpected weight change. Negative for chills, diaphoresis and fever.  HENT:  Negative for congestion, rhinorrhea and sneezing.    Eyes: Negative.   Respiratory:  Negative for cough, chest tightness and shortness of breath.   Cardiovascular:  Negative for chest pain and leg swelling.  Gastrointestinal:  Positive for abdominal pain and nausea. Negative for blood in stool, diarrhea and vomiting.  Genitourinary:  Negative for difficulty urinating, flank pain, frequency and hematuria.  Musculoskeletal:  Negative for arthralgias and back pain.  Skin:  Negative for rash.  Neurological:  Negative for dizziness, speech difficulty, weakness, numbness and headaches.    Updated Vital Signs BP (!) 150/73 (BP Location: Right Arm)   Pulse 70   Temp 98 F (36.7 C) (Oral)   Resp 16   SpO2 97%   Physical Exam Constitutional:      Appearance: She is well-developed.  HENT:     Head: Normocephalic and atraumatic.  Eyes:     Pupils: Pupils are equal, round, and reactive to light.  Cardiovascular:     Rate and Rhythm: Normal rate and regular rhythm.     Heart sounds: Normal heart sounds.  Pulmonary:     Effort: Pulmonary effort is normal. No respiratory distress.     Breath sounds: Normal breath sounds. No wheezing or rales.  Chest:     Chest wall: No tenderness.  Abdominal:     General: Bowel sounds are normal.     Palpations: Abdomen is soft.     Tenderness: There is no abdominal tenderness. There is no guarding or rebound.  Musculoskeletal:        General: Normal range of motion.     Cervical back: Normal range of motion and neck supple.  Lymphadenopathy:     Cervical: No cervical adenopathy.  Skin:    General: Skin is warm and dry.     Findings: No rash.  Neurological:     Mental Status: She is alert and oriented to person, place, and time.     (all labs ordered are listed, but only abnormal results are displayed) Labs Reviewed  COMPREHENSIVE METABOLIC PANEL WITH GFR - Abnormal; Notable for the following components:      Result Value   Potassium 3.3 (*)    Glucose, Bld 113 (*)    Creatinine, Ser 1.02 (*)     GFR, Estimated 57 (*)    All other components within normal limits  CBC WITH DIFFERENTIAL/PLATELET - Abnormal; Notable for the following components:   Hemoglobin 11.7 (*)    HCT 34.7 (*)    MCV 75.6 (*)    MCH 25.5 (*)    All other components within normal limits  MAGNESIUM  - Abnormal; Notable for the following components:   Magnesium  1.5 (*)    All other components within normal limits  URINALYSIS, ROUTINE W REFLEX MICROSCOPIC - Abnormal; Notable for the following components:   Specific Gravity, Urine 1.039 (*)    Glucose, UA >1,000 (*)    Ketones, ur TRACE (*)  Protein, ur TRACE (*)    Bacteria, UA RARE (*)    All other components within normal limits  LIPASE, BLOOD    EKG: None  Radiology: CT ABDOMEN PELVIS W CONTRAST Result Date: 08/29/2024 CLINICAL DATA:  Abdominal pain, acute, nonlocalized weight loss, decreased appetitie EXAM: CT ABDOMEN AND PELVIS WITH CONTRAST TECHNIQUE: Multidetector CT imaging of the abdomen and pelvis was performed using the standard protocol following bolus administration of intravenous contrast. RADIATION DOSE REDUCTION: This exam was performed according to the departmental dose-optimization program which includes automated exposure control, adjustment of the mA and/or kV according to patient size and/or use of iterative reconstruction technique. CONTRAST:  OMNIPAQUE  IOHEXOL  300 MG/ML  SOLN COMPARISON:  04/30/2018 FINDINGS: Lower chest: No acute findings Hepatobiliary: No focal hepatic abnormality. Gallbladder unremarkable. Pancreas: No focal abnormality or ductal dilatation. Spleen: No focal abnormality.  Normal size. Adrenals/Urinary Tract: Adrenal glands normal. Cysts in the lower pole of the left kidney measuring up 2 3.4 cm appear benign. Low-density lesion in the mid to upper pole of the left kidney medially measures 1.5 cm and does not appear definitively cystic. No suspicious abnormality on the right. No stones or hydronephrosis. Urinary  bladder unremarkable. Stomach/Bowel: Stomach is within normal limits. Appendix appears normal. No evidence of bowel wall thickening, distention, or inflammatory changes. Vascular/Lymphatic: Aortoiliac atherosclerosis. No evidence of aneurysm or adenopathy. Reproductive: Prior hysterectomy.  No adnexal masses. Other: No free fluid or free air. Musculoskeletal: No acute bony abnormality. IMPRESSION: 1.5 cm low-density lesion in the mid upper pole of the left kidney not appear definitively cystic. Recommend further evaluation with nonemergent MRI. No acute findings in the abdomen or pelvis. Aortoiliac atherosclerosis. Electronically Signed   By: Franky Crease M.D.   On: 08/29/2024 20:35     Procedures   Medications Ordered in the ED  ondansetron  (ZOFRAN -ODT) disintegrating tablet 8 mg (8 mg Oral Given 08/29/24 1904)  iohexol  (OMNIPAQUE ) 300 MG/ML solution 100 mL (100 mLs Intravenous Contrast Given 08/29/24 2028)                                    Medical Decision Making Amount and/or Complexity of Data Reviewed Radiology: ordered.  Risk Prescription drug management.   This patient presents to the ED for concern of weight loss, decreased appetite, this involves an extensive number of treatment options, and is a complaint that carries with it a high risk of complications and morbidity.  I considered the following differential and admission for this acute, potentially life threatening condition.  The differential diagnosis includes gastritis, peptic ulcer disease, depression, cancerous lesion, bowel obstruction, colitis, cholecystitis, UTI  MDM:    Patient is a 73 year old who presents with decreased appetite and weight loss over the last couple of months.  She feels some vague abdominal discomfort but no specific areas of pain.  Labs reviewed and are nonconcerning.  Urine is not consistent with infection.  CT scan does not show any acute abnormality.  No evidence of colitis or bowel obstruction.   No evidence of gallbladder disease.  She does have a cystic structure on her left kidney that you will need an outpatient MRI.  Discussed following up with her PCP to arrange for this.  She was discharged home in good condition.  She was encouraged to have close follow-up with her PCP.  Return precautions were given.  She does seem a little bit depressed given that she  was encouraged to retire from her job sooner than she wanted to this summer and it seems like the symptoms started after this.  However she does not have any thoughts of self-harm.  Wonder if this is playing into her symptoms.  (Labs, imaging, consults)  Labs: I Ordered, and personally interpreted labs.  The pertinent results include: Relatively normal hemoglobin, urine not consistent with infection, lipase normal  Imaging Studies ordered: I ordered imaging studies including CT abdomen pelvis I independently visualized and interpreted imaging. I agree with the radiologist interpretation  Additional history obtained from chart.  External records from outside source obtained and reviewed including prior notes  Cardiac Monitoring: The patient was maintained on a cardiac monitor.  If on the cardiac monitor, I personally viewed and interpreted the cardiac monitored which showed an underlying rhythm of: Sinus rhythm  Reevaluation: After the interventions noted above, I reevaluated the patient and found that they have :stayed the same  Social Determinants of Health:    Disposition: Discharged to home  Co morbidities that complicate the patient evaluation  Past Medical History:  Diagnosis Date   ANEMIA, IRON DEFICIENCY, UNSPEC. 02/05/2007   Qualifier: Diagnosis of  By: Cleatus MD, Arlyss     ASTHMA, INTERMITTENT 09/07/2010   Qualifier: Diagnosis of  By: Gwenyth MD, [place]     CAD (coronary artery disease), native coronary artery 02/16/2015   Cath with 20% LCx and RCA   Colon polyps    CTS (carpal tunnel syndrome) 06/06/2016    Diabetes mellitus    Diverticulosis 05/17/12   DJD (degenerative joint disease) of lumbar spine    Enteritis    Flu vaccine need 09/19/2022   Gastric ulcer 04/2000   Hyperlipidemia    Hypertension    Irregular heart beat    Obesity    Osteoarthritis    Persistent atrial fibrillation (HCC)    CHADS2VASC score is 5 and on Xarelto    Pruritus 02/21/2021   Rash 02/15/2021   Renal cyst, left 05/17/12   Ventral hernia      Medicines Meds ordered this encounter  Medications   ondansetron  (ZOFRAN -ODT) disintegrating tablet 8 mg   iohexol  (OMNIPAQUE ) 300 MG/ML solution 100 mL    I have reviewed the patients home medicines and have made adjustments as needed  Problem List / ED Course: Problem List Items Addressed This Visit   None Visit Diagnoses       Decreased appetite    -  Primary     Renal cyst                    Final diagnoses:  Decreased appetite  Renal cyst    ED Discharge Orders     None          Lenor Hollering, MD 08/29/24 2314

## 2024-08-29 NOTE — Discharge Instructions (Addendum)
 Make an appointment to have close follow-up within the next week with your primary care doctor.  You need to discuss your appetite changes.  You may need a referral to follow-up with gastroenterology if your symptoms are not improving.  You also have a cyst on your left kidney that needs to have an MRI for further evaluation.  Discussed this with your primary care doctor.

## 2024-08-29 NOTE — ED Triage Notes (Signed)
 C/o generalized abd pain and emesis x 2 weeks. States feels bloated +nausea. Denies fever.

## 2024-08-31 ENCOUNTER — Emergency Department (HOSPITAL_COMMUNITY)

## 2024-08-31 ENCOUNTER — Emergency Department (HOSPITAL_COMMUNITY)
Admission: EM | Admit: 2024-08-31 | Discharge: 2024-08-31 | Disposition: A | Discharge status: Home / Self Care | Attending: Emergency Medicine | Admitting: Emergency Medicine

## 2024-08-31 ENCOUNTER — Other Ambulatory Visit: Payer: Self-pay

## 2024-08-31 DIAGNOSIS — Z7984 Long term (current) use of oral hypoglycemic drugs: Secondary | ICD-10-CM | POA: Diagnosis not present

## 2024-08-31 DIAGNOSIS — Z7901 Long term (current) use of anticoagulants: Secondary | ICD-10-CM | POA: Insufficient documentation

## 2024-08-31 DIAGNOSIS — E876 Hypokalemia: Secondary | ICD-10-CM | POA: Insufficient documentation

## 2024-08-31 DIAGNOSIS — I480 Paroxysmal atrial fibrillation: Secondary | ICD-10-CM | POA: Diagnosis not present

## 2024-08-31 DIAGNOSIS — Z79899 Other long term (current) drug therapy: Secondary | ICD-10-CM | POA: Insufficient documentation

## 2024-08-31 DIAGNOSIS — I499 Cardiac arrhythmia, unspecified: Secondary | ICD-10-CM | POA: Diagnosis not present

## 2024-08-31 DIAGNOSIS — I2510 Atherosclerotic heart disease of native coronary artery without angina pectoris: Secondary | ICD-10-CM | POA: Diagnosis not present

## 2024-08-31 DIAGNOSIS — E119 Type 2 diabetes mellitus without complications: Secondary | ICD-10-CM | POA: Insufficient documentation

## 2024-08-31 DIAGNOSIS — R002 Palpitations: Secondary | ICD-10-CM | POA: Diagnosis present

## 2024-08-31 DIAGNOSIS — R42 Dizziness and giddiness: Secondary | ICD-10-CM | POA: Diagnosis not present

## 2024-08-31 DIAGNOSIS — I4891 Unspecified atrial fibrillation: Secondary | ICD-10-CM

## 2024-08-31 DIAGNOSIS — R0602 Shortness of breath: Secondary | ICD-10-CM | POA: Diagnosis not present

## 2024-08-31 DIAGNOSIS — R231 Pallor: Secondary | ICD-10-CM | POA: Diagnosis not present

## 2024-08-31 LAB — COMPREHENSIVE METABOLIC PANEL WITH GFR
ALT: 15 U/L (ref 0–44)
AST: 25 U/L (ref 15–41)
Albumin: 3.3 g/dL — ABNORMAL LOW (ref 3.5–5.0)
Alkaline Phosphatase: 32 U/L — ABNORMAL LOW (ref 38–126)
Anion gap: 15 (ref 5–15)
BUN: 15 mg/dL (ref 8–23)
CO2: 23 mmol/L (ref 22–32)
Calcium: 9.2 mg/dL (ref 8.9–10.3)
Chloride: 108 mmol/L (ref 98–111)
Creatinine, Ser: 1.52 mg/dL — ABNORMAL HIGH (ref 0.44–1.00)
GFR, Estimated: 36 mL/min — ABNORMAL LOW (ref >60)
Glucose, Bld: 120 mg/dL — ABNORMAL HIGH (ref 70–99)
Potassium: 3.2 mmol/L — ABNORMAL LOW (ref 3.5–5.1)
Sodium: 146 mmol/L — ABNORMAL HIGH (ref 135–145)
Total Bilirubin: 0.9 mg/dL (ref 0.0–1.2)
Total Protein: 6.3 g/dL — ABNORMAL LOW (ref 6.5–8.1)

## 2024-08-31 LAB — CBC WITH DIFFERENTIAL/PLATELET
Abs Immature Granulocytes: 0.03 K/uL (ref 0.00–0.07)
Basophils Absolute: 0 K/uL (ref 0.0–0.1)
Basophils Relative: 1 %
Eosinophils Absolute: 0.1 K/uL (ref 0.0–0.5)
Eosinophils Relative: 1 %
HCT: 35.2 % — ABNORMAL LOW (ref 36.0–46.0)
Hemoglobin: 11.8 g/dL — ABNORMAL LOW (ref 12.0–15.0)
Immature Granulocytes: 1 %
Lymphocytes Relative: 23 %
Lymphs Abs: 1.5 K/uL (ref 0.7–4.0)
MCH: 26 pg (ref 26.0–34.0)
MCHC: 33.5 g/dL (ref 30.0–36.0)
MCV: 77.5 fL — ABNORMAL LOW (ref 80.0–100.0)
Monocytes Absolute: 0.6 K/uL (ref 0.1–1.0)
Monocytes Relative: 9 %
Neutro Abs: 4.3 K/uL (ref 1.7–7.7)
Neutrophils Relative %: 65 %
Platelets: 232 K/uL (ref 150–400)
RBC: 4.54 MIL/uL (ref 3.87–5.11)
RDW: 15.3 % (ref 11.5–15.5)
WBC: 6.6 K/uL (ref 4.0–10.5)
nRBC: 0 % (ref 0.0–0.2)

## 2024-08-31 LAB — I-STAT CHEM 8, ED
BUN: 16 mg/dL (ref 8–23)
Calcium, Ion: 1.18 mmol/L (ref 1.15–1.40)
Chloride: 107 mmol/L (ref 98–111)
Creatinine, Ser: 1.5 mg/dL — ABNORMAL HIGH (ref 0.44–1.00)
Glucose, Bld: 119 mg/dL — ABNORMAL HIGH (ref 70–99)
HCT: 34 % — ABNORMAL LOW (ref 36.0–46.0)
Hemoglobin: 11.6 g/dL — ABNORMAL LOW (ref 12.0–15.0)
Potassium: 3.2 mmol/L — ABNORMAL LOW (ref 3.5–5.1)
Sodium: 144 mmol/L (ref 135–145)
TCO2: 25 mmol/L (ref 22–32)

## 2024-08-31 LAB — TROPONIN I (HIGH SENSITIVITY): Troponin I (High Sensitivity): 53 ng/L — ABNORMAL HIGH (ref <18)

## 2024-08-31 MED ORDER — METOPROLOL TARTRATE 5 MG/5ML IV SOLN
5.0000 mg | Freq: Once | INTRAVENOUS | Status: DC
  Filled 2024-08-31: qty 5

## 2024-08-31 MED ORDER — POTASSIUM CHLORIDE CRYS ER 20 MEQ PO TBCR
40.0000 meq | EXTENDED_RELEASE_TABLET | Freq: Once | ORAL | Status: AC
  Administered 2024-08-31: 40 meq via ORAL
  Filled 2024-08-31: qty 2

## 2024-08-31 MED ORDER — ETOMIDATE 2 MG/ML IV SOLN
INTRAVENOUS | Status: AC | PRN
Start: 2024-08-31 — End: 2024-08-31
  Administered 2024-08-31: 10 mg via INTRAVENOUS

## 2024-08-31 MED ORDER — ETOMIDATE 2 MG/ML IV SOLN
10.0000 mg | Freq: Once | INTRAVENOUS | Status: DC
Start: 2024-08-31 — End: 2024-09-01

## 2024-08-31 MED ORDER — SODIUM CHLORIDE 0.9 % IV BOLUS
500.0000 mL | Freq: Once | INTRAVENOUS | Status: AC
  Administered 2024-08-31: 500 mL via INTRAVENOUS

## 2024-08-31 NOTE — ED Provider Notes (Signed)
 [place] [facility]MENT AT [facility] Provider Note  CSN: [id number] Arrival date & time: 08/31/24  1638    Patient presents with: Atrial Fibrillation  Meredith [name] is a 73 y.o. female with a history of PAF on Xarelto , CAD, T2DM, HTN, HLD who was brought in by EMS with complaints of palpitations, tachycardia, and dizziness onset this morning. Patient states that she usually takes a walk in the park each morning without problems. During her walk this morning, she started feeling short of breath, dizzy, and felt her heart beating fast with palpitations. After the onset of her symptoms, she sat back down in her truck to rest for a few minutes, but her symptoms still persisted so she called EMS. She states that she takes her Xarelto  every night and has not missed a dose that she can recall. Also reports good adherence to Toprol .  She is a patient of Dr. Shlomo at [facility]. No bleeding concerns on Xarelto , increased Toprol -XL from 50 mg to 75 mg in February 2025.    Prior to Admission medications   Medication Sig Start Date End Date Taking? Authorizing Provider  acetaminophen  (ACETAMINOPHEN  8 HOUR) 650 MG CR tablet Take 1 tablet (650 mg total) by mouth every 8 (eight) hours. 10/22/18   Mesner, Selinda, MD  amLODipine -valsartan  (EXFORGE ) 5-160 MG tablet TAKE 1 TABLET BY MOUTH EVERY DAY 06/01/24   Cleotilde Perkins, DO  Bempedoic Acid-Ezetimibe  (NEXLIZET ) 180-10 MG TABS Take 1 tablet by mouth daily. 02/13/24   Shlomo Wilbert SAUNDERS, MD  diclofenac  sodium (VOLTAREN ) 1 % GEL Apply 2 g topically 4 (four) times daily. 08/27/19   Benjamine Hamilton, DO  fexofenadine  (ALLEGRA ) 180 MG tablet Take 1 tablet (180 mg total) by mouth daily as needed for allergies. 06/03/24   McDiarmid, Krystal BIRCH, MD  INVOKANA  100 MG TABS tablet TAKE 1 TABLET BY MOUTH DAILY BEFORE BREAKFAST. 02/11/24   Cleotilde Perkins, DO  metFORMIN  (GLUCOPHAGE ) 1000 MG tablet TAKE 1 TABLET BY MOUTH TWICE A DAY WITH FOOD 12/30/23   Cleotilde Perkins, DO  metoprolol  succinate (TOPROL -XL) 25 MG 24 hr tablet Take 3 tablets (75 mg total) by mouth daily. Take with or immediately following a meal. 08/19/24   McDiarmid, Krystal BIRCH, MD  olopatadine  (PATADAY ) 0.1 % ophthalmic solution Place 1 drop into both eyes 2 (two) times daily. 06/17/24   McDiarmid, Krystal BIRCH, MD  potassium chloride  (KLOR-CON  M) 10 MEQ tablet Take 1 tablet (10 mEq total) by mouth daily. For 7 days. 08/20/24   McDiarmid, Krystal BIRCH, MD  potassium chloride  (KLOR-CON ) 10 MEQ tablet Take 20 mEq by mouth 2 (two) times daily. 07/08/24   [provider]  rivaroxaban  (XARELTO ) 20 MG TABS tablet Take 1 tablet (20 mg total) by mouth daily. 02/13/24   Shlomo Wilbert SAUNDERS, MD  rosuvastatin  (CRESTOR ) 40 MG tablet Take 1 tablet (40 mg total) by mouth daily. 02/13/24   Shlomo Wilbert SAUNDERS, MD  spironolactone  (ALDACTONE ) 25 MG tablet Take 25 mg by mouth daily. 08/29/24   [provider]  spironolactone  (ALDACTONE ) 50 MG tablet Take 1 tablet (50 mg total) by mouth at bedtime. 07/15/24   McDiarmid, Krystal BIRCH, MD  tiZANidine  (ZANAFLEX ) 4 MG tablet Take 1 tablet (4 mg total) by mouth at bedtime. 08/19/24   Howell Lunger, DO    Allergies: Lisinopril , Doxycycline , Flexeril [cyclobenzaprine hcl], Lipitor [atorvastatin calcium ], and Metaxalone    Review of Systems  Constitutional:  Negative for fever.  Respiratory:  Positive for shortness of  breath (on exertion).   Cardiovascular:  Positive for palpitations. Negative for chest pain and leg swelling.  Neurological:  Positive for dizziness. Negative for syncope, light-headedness and headaches.    Updated Vital Signs BP 117/72   Pulse 72   Temp 98.2 F (36.8 C) (Oral)   Resp 17   Ht 5' 3 (1.6 m)   Wt 89.4 kg   SpO2 99%   BMI 34.90 kg/m   Physical Exam Constitutional:      Appearance: She is not diaphoretic.  HENT:     Head: Normocephalic and atraumatic.  Eyes:     General: No scleral icterus.    Extraocular Movements: Extraocular movements  intact.     Conjunctiva/sclera: Conjunctivae normal.     Pupils: Pupils are equal, round, and reactive to light.  Cardiovascular:     Rate and Rhythm: Tachycardia present. Rhythm irregular.     Heart sounds: No murmur heard.    No friction rub. No gallop.  Pulmonary:     Effort: No respiratory distress.     Breath sounds: No stridor. No wheezing, rhonchi or rales.  Abdominal:     General: Abdomen is flat. Bowel sounds are normal. There is no distension.     Palpations: Abdomen is soft. There is no mass.     Tenderness: There is no abdominal tenderness. There is no guarding or rebound.     Hernia: No hernia is present.  Musculoskeletal:     Cervical back: Normal range of motion.  Neurological:     General: No focal deficit present.     Mental Status: She is alert and oriented to person, place, and time.  Psychiatric:        Mood and Affect: Mood normal.        Thought Content: Thought content normal.     (all labs ordered are listed, but only abnormal results are displayed) Labs Reviewed  CBC WITH DIFFERENTIAL/PLATELET - Abnormal; Notable for the following components:      Result Value   Hemoglobin 11.8 (*)    HCT 35.2 (*)    MCV 77.5 (*)    All other components within normal limits  COMPREHENSIVE METABOLIC PANEL WITH GFR - Abnormal; Notable for the following components:   Sodium 146 (*)    Potassium 3.2 (*)    Glucose, Bld 120 (*)    Creatinine, Ser 1.52 (*)    Total Protein 6.3 (*)    Albumin 3.3 (*)    Alkaline Phosphatase 32 (*)    GFR, Estimated 36 (*)    All other components within normal limits  I-STAT CHEM 8, ED - Abnormal; Notable for the following components:   Potassium 3.2 (*)    Creatinine, Ser 1.50 (*)    Glucose, Bld 119 (*)    Hemoglobin 11.6 (*)    HCT 34.0 (*)    All other components within normal limits  TROPONIN I (HIGH SENSITIVITY) - Abnormal; Notable for the following components:   Troponin I (High Sensitivity) 53 (*)    All other components  within normal limits    EKG: EKG Interpretation Date/Time:  Tuesday August 31 2024 17:28:10 EDT Ventricular Rate:  93 PR Interval:  181 QRS Duration:  114 QT Interval:  336 QTC Calculation: 418 R Axis:   -43  Text Interpretation: Sinus rhythm LVH with IVCD, LAD and secondary repol abnrm last EKG showed afib Confirmed by Patt Alm DEL [phone]) on 08/31/2024 5:54:10 PM  Radiology: [facility] Chest Port 1 [address]  Result Date: 08/31/2024 CLINICAL DATA:  Shortness of breath. EXAM: PORTABLE CHEST 1 VIEW COMPARISON:  Chest x-ray 02/05/2024. FINDINGS: The heart size and mediastinal contours are within normal limits. Both lungs are clear. The visualized skeletal structures are unremarkable. IMPRESSION: No active disease. Electronically Signed   By: Greig Pique M.D.   On: 08/31/2024 17:16    .Cardioversion  Date/Time: 08/31/2024 5:19 PM  Performed by: Waymond Cart, MD Authorized by: Patt Alm Macho, MD   Consent:    Consent obtained:  Written   Consent given by:  Patient   Risks discussed:  Induced arrhythmia Pre-procedure details:    Cardioversion basis:  Emergent   Rhythm:  Atrial fibrillation   Electrode placement:  Anterior-posterior Patient sedated: Yes. Refer to sedation procedure documentation for details of sedation.  Attempt one:    Cardioversion mode:  Synchronous   Waveform:  Biphasic   Shock (Joules):  200   Shock outcome:  Conversion to normal sinus rhythm Post-procedure details:    Patient status:  Awake   Patient tolerance of procedure:  Tolerated well, no immediate complications .Sedation  Date/Time: 08/31/2024 5:33 PM  Performed by: Waymond Cart, MD Authorized by: Patt Alm Macho, MD   Consent:    Consent obtained:  Written   Consent given by:  Patient   Risks discussed:  Prolonged hypoxia resulting in organ damage and inadequate sedation Universal protocol:    Immediately prior to procedure, a time out was called: yes     Patient identity confirmed:  Verbally  with patient Indications:    Procedure performed:  Cardioversion   Procedure necessitating sedation performed by:  Physician performing sedation Pre-sedation assessment:    Time since last food or drink:  7 hours   ASA classification: class 4 - patient with severe systemic disease that is a constant threat to life     Mallampati score:  Unable to assess   Pre-sedation assessments completed and reviewed: cardiovascular function     Pre-sedation assessments completed and reviewed: pre-procedure mental status not reviewed   A pre-sedation assessment was completed prior to the start of the procedure Immediate pre-procedure details:    Reviewed: vital signs     Verified: oxygen available   Procedure details (see MAR for exact dosages):    Sedation:  Etomidate    Intended level of sedation: deep   Intra-procedure events: none     Total Provider sedation time (minutes):  10 Post-procedure details:   A post-sedation assessment was completed following the completion of the procedure.   Attendance: Constant attendance by certified staff until patient recovered      Medications Ordered in the ED  etomidate  (AMIDATE ) injection 10 mg (10 mg Intravenous See Procedure Record 08/31/24 1725)  sodium chloride  0.9 % bolus 500 mL (0 mLs Intravenous Stopped 08/31/24 1902)  etomidate  (AMIDATE ) injection (10 mg Intravenous Given 08/31/24 1725)  potassium chloride  SA (KLOR-CON  M) CR tablet 40 mEq (40 mEq Oral Given 08/31/24 1936)   CHA2DS2-VASc Score of 5.  Medical Decision Making Patient with history of persistent Afib on Xarelto  and Toprol  75mg  now with EKG showing Afib with RVR and hemodynamic instability. Consistently hypotensive with BP down to 86/69. Reports consistent Xarelto  use and has not missed any doses. Will use etomidate  10mg  for sedation and pursue synchronized electrical cardioversion with 200J.  6:00 PM Patient now s/p cardioversion which successfully converted her rhythm back to NSR. BP  improving to 109/67. I-STAT reviewed and showing mild hypokalemia with elevated creatinine to 1.5. Will  replete potassium orally. Awaiting CBC, BMP, troponins.   7:47 PM Vitals stable, BP of 113/62 with HR 77. Still in normal sinus rhythm. CMP with hypernatremia. Initial troponin elevated to 53. Low concern for ACS; elevated troponins in the setting of demand ischemia with poor perfusion from atrial fibrillation w/ RVR. This is also supported by findings of AKI and hypernatremia. Patient is medically ready for discharge. No other concerning findings on labs or exam. Patient currently asymptomatic in NSR. Does not meet criteria for admission. Will have her follow up with her cardiologist and I counseled her to take her metoprolol  and Xarelto  as prescribed. Patient states she does not have a ride home so will give her voucher for taxi.  Problems Addressed: Atrial fibrillation with RVR (HCC): acute illness or injury with systemic symptoms  Amount and/or Complexity of Data Reviewed Labs: ordered. Radiology: ordered and independent interpretation performed.    Details: CXR reviewed and no acute cardiopulmonary abnormalities. ECG/medicine tests: ordered and independent interpretation performed.    Details: Initial EKG with Afib with RVR.  Risk Prescription drug management.   Final diagnoses:  Atrial fibrillation with RVR [facility])    ED Discharge Orders          Ordered    Ambulatory referral to Cardiology       Comments: If you have not heard from the Cardiology office within the next 72 hours please call [phone].   Pending               Waymond Cart, MD 08/31/24 2124    Patt Alm Macho, MD 08/31/24 [phone]

## 2024-08-31 NOTE — Discharge Instructions (Addendum)
 Please take your xarelto  tonight.   Continue your current meds  See Dr. Shlomo for follow up in the office  Return to ER if you have worse chest pain, shortness of breath, dizziness

## 2024-08-31 NOTE — ED Triage Notes (Signed)
 Patient with two episodes of dizziness and lightheadedness today, the first time after walking in the park. This resolved after resting but then recurred and has not subsided. EMS found her to be in afib RVR 110-160, unrelieved by 500cc fluid bolus.

## 2024-09-01 ENCOUNTER — Encounter: Payer: Self-pay | Admitting: Student

## 2024-09-01 ENCOUNTER — Ambulatory Visit (INDEPENDENT_AMBULATORY_CARE_PROVIDER_SITE_OTHER): Admitting: Student

## 2024-09-01 VITALS — BP 140/72 | HR 76 | Wt 190.4 lb

## 2024-09-01 DIAGNOSIS — N1831 Chronic kidney disease, stage 3a: Secondary | ICD-10-CM

## 2024-09-01 DIAGNOSIS — N2889 Other specified disorders of kidney and ureter: Secondary | ICD-10-CM | POA: Diagnosis not present

## 2024-09-01 DIAGNOSIS — R634 Abnormal weight loss: Secondary | ICD-10-CM | POA: Diagnosis not present

## 2024-09-01 MED ORDER — HYDROXYZINE HCL 10 MG PO TABS: 10.0000 mg | ORAL_TABLET | Freq: Once | ORAL | 0 refills | Status: AC

## 2024-09-01 NOTE — Assessment & Plan Note (Signed)
 Future lab orders placed for blood work

## 2024-09-01 NOTE — Patient Instructions (Addendum)
 It was great to see you today!   I am ordering a scan called an MRI to look at your kidney that had an abnormal spot on it from the scan you had in the Emergency Room.   I will send you in a medication to help with anxiety during the scan. Please bring a driver to take you home with this medication.   You will need to schedule an appointment with cardiology to follow up from your recent hospitalization.   I would like to see you back in 1-2 weeks for follow up on blood work.    Future Appointments  Date Time Provider Department Center  11/11/2024  9:10 AM [facility] ANNUAL WELLNESS VISIT [facility]    Please arrive 15 minutes before your appointment to ensure smooth check in process.  If you are more than 15 minutes late, you may be asked to reschedule.   Please bring a list of your medications with you to all appointments.   Please call the clinic at [phone] if your symptoms worsen or you have any concerns.  Thank you for allowing me to participate in your care, Dr. Damien Pinal [facility]

## 2024-09-01 NOTE — Progress Notes (Signed)
    SUBJECTIVE:   CHIEF COMPLAINT / HPI:   Meredith [name] is a 73 y.o. female presenting for ED follow up.   Previously evaluated in the ED on 9/21 and 9/22 for decreased appetite and weight loss. CT AP was negative.   Needs outpatient MRI for cyst on left kidney   Represented to the ED on 9/22 for heart palpitations. H/o a fib on Xarelto . She was found to be symptomatic and she was cardioverted in the ED. Her troponin was negative. Cardiology will follow up outpatient.   Patient continues to struggle with decreased appetite. Denies N/V. Has lost app 11 lbs since 06/2024 unintentionally.   PERTINENT  PMH / PSH: reviewed and updated.  OBJECTIVE:   BP (!) 140/72   Pulse 76   Wt 190 lb 6.4 oz (86.4 kg)   SpO2 100%   BMI 33.73 kg/m   Chronically ill-appearing, no acute distress Cardio: Regular rate, regular rhythm, no murmurs on exam. Pulm: Clear, no wheezing, no crackles. No increased work of breathing Abdominal: bowel sounds present, soft, non-tender, non-distended Extremities: no peripheral edema  Neuro: alert and oriented x3, speech normal in content, no facial asymmetry, strength intact and equal bilaterally in UE and LE, pupils equal and reactive to light.  Psych:  Cognition and judgment appear intact. Alert, communicative  and cooperative with normal attention span and concentration. No apparent delusions, illusions, hallucinations    ASSESSMENT/PLAN:   Assessment & Plan Unintentional weight loss Other specified disorders of kidney and ureter Renal mass Weight loss combined with abnormal finding on CT AP of 1.5 cm lesion in left upper kidney is concerning. Will start with MRI for continued workup. Reviewed lab results from ED which showed worsening kidney function and abnormal electrolytes.  Recheck CMP in one week for to monitor kidney function and hypokalemia  Ordering MRI AP  Prescribing one time dose of Atarax  to help with anxiety during MRI  Stage 3a chronic  kidney disease (HCC) Future lab orders placed for blood work   Patient to schedule an appointment with cardiology  Damien Pinal, DO [facility]

## 2024-09-01 NOTE — Assessment & Plan Note (Addendum)
 Weight loss combined with abnormal finding on CT AP of 1.5 cm lesion in left upper kidney is concerning. Will start with MRI for continued workup. Reviewed lab results from ED which showed worsening kidney function and abnormal electrolytes.  Recheck CMP in one week for to monitor kidney function and hypokalemia  Ordering MRI AP  Prescribing one time dose of Atarax  to help with anxiety during MRI

## 2024-09-03 ENCOUNTER — Ambulatory Visit: Attending: Nurse Practitioner | Admitting: Nurse Practitioner

## 2024-09-03 VITALS — BP 114/66 | HR 69 | Ht 63.0 in | Wt 191.0 lb

## 2024-09-03 DIAGNOSIS — I4819 Other persistent atrial fibrillation: Secondary | ICD-10-CM | POA: Diagnosis not present

## 2024-09-03 DIAGNOSIS — E785 Hyperlipidemia, unspecified: Secondary | ICD-10-CM

## 2024-09-03 DIAGNOSIS — I2510 Atherosclerotic heart disease of native coronary artery without angina pectoris: Secondary | ICD-10-CM | POA: Diagnosis not present

## 2024-09-03 DIAGNOSIS — I10 Essential (primary) hypertension: Secondary | ICD-10-CM

## 2024-09-03 NOTE — Progress Notes (Signed)
 Office Visit    Patient Name: Meredith [name] Date of Encounter: 09/03/2024  Primary Care Provider:  Cleotilde Perkins, DO Primary Cardiologist:  Wilbert Bihari, MD  Chief Complaint    73 year old female with a history of nonobstructive CAD, persistent atrial fibrillation, atrial tachycardia, hypertension, and hyperlipidemia who presents for hospital follow-up related to atrial fibrillation.  Past Medical History    Past Medical History:  Diagnosis Date   ANEMIA, IRON DEFICIENCY, UNSPEC. 02/05/2007   Qualifier: Diagnosis of  By: Cleatus MD, Arlyss     ASTHMA, INTERMITTENT 09/07/2010   Qualifier: Diagnosis of  By: Gwenyth MD, [place]     CAD (coronary artery disease), native coronary artery 02/16/2015   Cath with 20% LCx and RCA   Colon polyps    CTS (carpal tunnel syndrome) 06/06/2016   Diabetes mellitus    Diverticulosis 05/17/12   DJD (degenerative joint disease) of lumbar spine    Enteritis    Flu vaccine need 09/19/2022   Gastric ulcer 04/2000   Hyperlipidemia    Hypertension    Irregular heart beat    Obesity    Osteoarthritis    Persistent atrial fibrillation (HCC)    CHADS2VASC score is 5 and on Xarelto    Pruritus 02/21/2021   Rash 02/15/2021   Renal cyst, left 05/17/12   Ventral hernia    Past Surgical History:  Procedure Laterality Date   ABDOMINAL HYSTERECTOMY     LEFT HEART CATHETERIZATION WITH CORONARY ANGIOGRAM N/A 02/16/2015   Procedure: LEFT HEART CATHETERIZATION WITH CORONARY ANGIOGRAM;  Surgeon: Lonni JONETTA Cash, MD;  Location: [facility] CATH LAB;  Service: Cardiovascular;  Laterality: N/A;   OPERATIVE HYSTEROSCOPY     TUBAL LIGATION     UTERINE FIBROID SURGERY      Allergies  Allergies  Allergen Reactions   Lisinopril  Anaphylaxis, Shortness Of Breath and Swelling    Mouth and throat became swollen   Doxycycline  Swelling    Possibly caused swelling of lips and hives in May 2019, timeline unclear   Flexeril [Cyclobenzaprine Hcl] Hives and Itching   Lipitor  [Atorvastatin Calcium ] Other (See Comments)    Myalgia   Metaxalone Hives and Itching          Labs/Other Studies Reviewed    The following studies were reviewed today:  Cardiac Studies & Procedures   ______________________________________________________________________________________________              ______________________________________________________________________________________________     Recent Labs: 08/29/2024: Magnesium  1.5 08/31/2024: ALT 15; BUN 16; Creatinine, Ser 1.50; Hemoglobin 11.6; Platelets 232; Potassium 3.2; Sodium 144  Recent Lipid Panel    Component Value Date/Time   CHOL 134 03/14/2022 0808   TRIG 81 03/14/2022 0808   HDL 49 03/14/2022 0808   CHOLHDL 2.7 03/14/2022 0808   CHOLHDL 2.3 05/16/2016 0739   VLDL 17 05/16/2016 0739   LDLCALC 69 03/14/2022 0808   LDLDIRECT 130 (H) 05/13/2012 0917    History of Present Illness    73 year old female with the above past medical history including nonobstructive CAD, persistent atrial fibrillation, atrial tachycardia, hypertension, and hyperlipidemia.   Cardiac catheterization in 2016 in the setting abnormal stress test revealed 20% proximal LCx stenosis, 20% OM1 stenosis, 20% RCA stenosis, EF 55 to 60%, mild nonobstructive CAD.  She has a history of persistent atrial fibrillation on Xarelto .  She was last seen in the office on 02/13/2024 and was stable from a cardiac standpoint.  BP was elevated, Toprol  was increased to 75 mg daily in the setting  of palpitations.  She was seen in the ED on 08/31/2024 in the setting of shortness of breath, dizziness, she was noted to have recurrent atrial fibrillation.  She underwent DCCV with restoration of normal sinus rhythm. Troponin was mildly elevated.  This was noted to likely be in the setting of demand ischemia due to atrial fibrillation with RVR.  She was discharged home in stable condition.   She presents today for follow-up.  Since her recent ED visit she has  been stable overall from a cardiac standpoint.  Patient is a somewhat difficult historian. She reports intermittent fleeting palpitations, mild dyspnea on exertion. She denies any chest pain dizziness, edema, PND, orthopnea, weight gain.  She also complains of abdominal fullness, unintentional weight loss.  Recent CT showed concern for possible renal mass.  She is pending MRI per PCP.  Additionally, she reports worsening memory impairment, she states she often forgets to take her medication.  Home Medications    Current Outpatient Medications  Medication Sig Dispense Refill   acetaminophen  (ACETAMINOPHEN  8 HOUR) 650 MG CR tablet Take 1 tablet (650 mg total) by mouth every 8 (eight) hours. 30 tablet 0   amLODipine -valsartan  (EXFORGE ) 5-160 MG tablet TAKE 1 TABLET BY MOUTH EVERY DAY 90 tablet 3   Bempedoic Acid-Ezetimibe  (NEXLIZET ) 180-10 MG TABS Take 1 tablet by mouth daily. 90 tablet 3   diclofenac  sodium (VOLTAREN ) 1 % GEL Apply 2 g topically 4 (four) times daily. 100 g 0   fexofenadine  (ALLEGRA ) 180 MG tablet Take 1 tablet (180 mg total) by mouth daily as needed for allergies. 90 tablet 3   INVOKANA  100 MG TABS tablet TAKE 1 TABLET BY MOUTH DAILY BEFORE BREAKFAST. 90 tablet 3   metFORMIN  (GLUCOPHAGE ) 1000 MG tablet TAKE 1 TABLET BY MOUTH TWICE A DAY WITH FOOD 180 tablet 3   metoprolol  succinate (TOPROL -XL) 25 MG 24 hr tablet Take 3 tablets (75 mg total) by mouth daily. Take with or immediately following a meal. 270 tablet 3   olopatadine  (PATADAY ) 0.1 % ophthalmic solution Place 1 drop into both eyes 2 (two) times daily. 5 mL 12   potassium chloride  (KLOR-CON ) 10 MEQ tablet Take 20 mEq by mouth 2 (two) times daily.     rivaroxaban  (XARELTO ) 20 MG TABS tablet Take 1 tablet (20 mg total) by mouth daily. 90 tablet 3   rosuvastatin  (CRESTOR ) 40 MG tablet Take 1 tablet (40 mg total) by mouth daily. 90 tablet 3   spironolactone  (ALDACTONE ) 25 MG tablet Take 25 mg by mouth daily.     tiZANidine   (ZANAFLEX ) 4 MG tablet Take 1 tablet (4 mg total) by mouth at bedtime. 30 tablet 0   potassium chloride  (KLOR-CON  M) 10 MEQ tablet Take 1 tablet (10 mEq total) by mouth daily. For 7 days. (Patient not taking: Reported on 09/03/2024)     spironolactone  (ALDACTONE ) 50 MG tablet Take 1 tablet (50 mg total) by mouth at bedtime. (Patient not taking: Reported on 09/03/2024) 30 tablet 3   No current facility-administered medications for this visit.     Review of Systems    She denies chest pain, pnd, orthopnea, n, v, dizziness, syncope, edema, weight gain, or early satiety. All other systems reviewed and are otherwise negative except as noted above.   Physical Exam    VS:  BP 114/66 (BP Location: Right Arm, Patient Position: Sitting, Cuff Size: Large)   Pulse 69   Ht 5' 3 (1.6 m)   Wt 191 lb (86.6 kg)  SpO2 95%   BMI 33.83 kg/m   GEN: Well nourished, well developed, in no acute distress. HEENT: normal. Neck: Supple, no JVD, carotid bruits, or masses. Cardiac: RRR, no murmurs, rubs, or gallops. No clubbing, cyanosis, edema.  Radials/DP/PT 2+ and equal bilaterally.  Respiratory:  Respirations regular and unlabored, clear to auscultation bilaterally. GI: Soft, nontender, nondistended, BS + x 4. MS: no deformity or atrophy. Skin: warm and dry, no rash. Neuro:  Strength and sensation are intact. Psych: Normal affect.  Accessory Clinical Findings    ECG personally reviewed by me today - EKG Interpretation Date/Time:  Friday September 03 2024 13:35:10 EDT Ventricular Rate:  69 PR Interval:    QRS Duration:  116 QT Interval:  372 QTC Calculation: 398 R Axis:   -33  Text Interpretation: Normal sinus rhythm with frequent Premature atrial complexes Left axis deviation Left ventricular hypertrophy with QRS widening ( R in aVL , Cornell product ) Cannot rule out Septal infarct , age undetermined When compared with ECG of 31-Aug-2024 17:28, PREVIOUS ECG IS PRESENT Confirmed by Daneen Perkins  [phone]) on 09/03/2024 1:53:25 PM  - no acute changes.   Lab Results  Component Value Date   WBC 6.6 08/31/2024   HGB 11.6 (L) 08/31/2024   HCT 34.0 (L) 08/31/2024   MCV 77.5 (L) 08/31/2024   PLT 232 08/31/2024   Lab Results  Component Value Date   CREATININE 1.50 (H) 08/31/2024   BUN 16 08/31/2024   NA 144 08/31/2024   K 3.2 (L) 08/31/2024   CL 107 08/31/2024   CO2 23 08/31/2024   Lab Results  Component Value Date   ALT 15 08/31/2024   AST 25 08/31/2024   ALKPHOS 32 (L) 08/31/2024   BILITOT 0.9 08/31/2024   Lab Results  Component Value Date   CHOL 134 03/14/2022   HDL 49 03/14/2022   LDLCALC 69 03/14/2022   LDLDIRECT 130 (H) 05/13/2012   TRIG 81 03/14/2022   CHOLHDL 2.7 03/14/2022    Lab Results  Component Value Date   HGBA1C 6.2 03/19/2024    Assessment & Plan    1. Persistent atrial fibrillation: Recent ED visit in the setting of atrial fibrillation with RVR with associated shortness of breath, dizziness.  Troponin was mildly elevated, this was noted to likely be the setting of demand ischemia.  S/p DCCV on 08/31/2024.  EKG today shows sinus rhythm with frequent PACs and bigeminy pattern. She notes intermittent palpitations, mild dyspnea on exertion.  Euvolemic and well compensated on exam.  Encouraged adherence to medication regimen.  High risk for recurrent atrial fibrillation.  Continue to monitor symptoms, reviewed ED precautions.  Continue metoprolol , Xarelto .  2. CAD: Cardiac catheterization in 2016 in the setting abnormal stress test revealed 20% proximal LCx stenosis, 20% OM1 stenosis, 20% RCA stenosis, EF 55 to 60%, mild nonobstructive CAD.  She reports mild dyspnea on exertion, denies chest pain.  Continue to monitor symptoms.  Should she have progressive symptoms concerning for possible angina class anginal equivalent, consider need for ischemic evaluation.  Continue metoprolol , amlodipine -valsartan , spironolactone , Crestor , Nexlizet .   3. Hypertension: BP  well controlled. Continue current antihypertensive regimen.   4. Hyperlipidemia: No recent LDL on file.  Monitored per PCP.  Continue Crestor .  5. Hypokalemia: Labs during recent ED visit showed mild hypokalemia.  Pending repeat CMET next week per PCP.  6. Abdominal fullness/unintentional weight loss:  Recent CT showed concern for renal mass. Pending MRI per PCP.   7. Memory impairment: Patient reports  worsening memory, she often forgets to take her medication.  Recommend follow-up with PCP.  8. Disposition: Follow-up in 2 to 3 months with Dr. Shlomo.      Damien JAYSON Braver, NP 09/03/2024, 1:54 PM

## 2024-09-03 NOTE — Patient Instructions (Signed)
 Medication Instructions:  Your physician recommends that you continue on your current medications as directed. Please refer to the Current Medication list given to you today.  *If you need a refill on your cardiac medications before your next appointment, please call your pharmacy*  Lab Work: NONE ordered at this time of appointment. (Please get lab work done at your Primary Care Physicians office next week, Potassium)  Testing/Procedures: NONE ordered at this time of appointment   Follow-Up: At [facility], you and your health needs are our priority.  As part of our continuing mission to provide you with exceptional heart care, our providers are all part of one team.  This team includes your primary Cardiologist (physician) and Advanced Practice Providers or APPs (Physician Assistants and Nurse Practitioners) who all work together to provide you with the care you need, when you need it.  Your next appointment:   2-3 month(s)  Provider:   Wilbert Bihari, MD    We recommend signing up for the patient portal called MyChart.  Sign up information is provided on this After Visit Summary.  MyChart is used to connect with patients for Virtual Visits (Telemedicine).  Patients are able to view lab/test results, encounter notes, upcoming appointments, etc.  Non-urgent messages can be sent to your provider as well.   To learn more about what you can do with MyChart, go to [email or web address].

## 2024-09-06 ENCOUNTER — Encounter: Payer: Self-pay | Admitting: Nurse Practitioner

## 2024-09-08 ENCOUNTER — Telehealth: Payer: Self-pay | Admitting: Cardiology

## 2024-09-08 ENCOUNTER — Telehealth: Payer: Self-pay | Admitting: Student

## 2024-09-08 ENCOUNTER — Other Ambulatory Visit

## 2024-09-08 ENCOUNTER — Ambulatory Visit (INDEPENDENT_AMBULATORY_CARE_PROVIDER_SITE_OTHER): Admitting: Family Medicine

## 2024-09-08 VITALS — BP 118/71 | HR 65 | Ht 63.0 in

## 2024-09-08 DIAGNOSIS — R634 Abnormal weight loss: Secondary | ICD-10-CM

## 2024-09-08 DIAGNOSIS — I4819 Other persistent atrial fibrillation: Secondary | ICD-10-CM

## 2024-09-08 DIAGNOSIS — I10 Essential (primary) hypertension: Secondary | ICD-10-CM

## 2024-09-08 DIAGNOSIS — N1831 Chronic kidney disease, stage 3a: Secondary | ICD-10-CM | POA: Diagnosis not present

## 2024-09-08 MED ORDER — METFORMIN HCL 1000 MG PO TABS: 1000.0000 mg | ORAL_TABLET | Freq: Two times a day (BID) | ORAL | 3 refills | Status: DC

## 2024-09-08 MED ORDER — ACCU-CHEK AVIVA PLUS W/DEVICE KIT: 1.0000 | PACK | 0 refills | Status: DC

## 2024-09-08 MED ORDER — METOPROLOL SUCCINATE ER 25 MG PO TB24: 75.0000 mg | ORAL_TABLET | Freq: Every day | ORAL | 3 refills | Status: DC

## 2024-09-08 MED ORDER — RIVAROXABAN 20 MG PO TABS: 20.0000 mg | ORAL_TABLET | Freq: Every day | ORAL | 3 refills | Status: DC

## 2024-09-08 NOTE — Patient Instructions (Addendum)
 It was great to see you today! Thank you for choosing [facility] for your primary care. Meredith [name] was seen for bradycardia (low heart rate).  Today we addressed: Low Heart Rate - your heart rate fluctuated while you were here in the office with us . EKG showed that you were in normal rhythm but you had some additional beats which may be causing some of your symptoms. Your heart rate improved and your BP was better prior to you leaving the office Please call your cardiologist office to see if you can see them soon for an appointment since your medications may need adjusting. Dr. Wilbert Bihari - [phone]  [address], [place], [place] [address]  We are checking some labs today. If they are abnormal, I will call you. If they are normal, I will send you a MyChart message (if it is active) or a letter in the mail. If you do not hear about your labs in the next 2 weeks, please call the office.  You should return to our clinic No follow-ups on file. Please arrive 15 minutes before your appointment to ensure smooth check in process.  We appreciate your efforts in making this happen.  Thank you for allowing me to participate in your care, Kathrine Melena, DO 09/08/2024, 11:47 AM PGY-2, [facility]

## 2024-09-08 NOTE — Assessment & Plan Note (Signed)
 Patient was seen in the ED on 9/23 for A-fib with AVR during which she had a DCCV.  She then followed up with Dr. Dorine office at [facility] on 9/26 no medication changes were made at that time and she had similar symptoms at that visit.  She continues to have some intermittent chest pressure, but no pain or radiation and some mild dyspnea.  She is euvolemic on exam and EKG performed today demonstrated NSR with frequent PACs with a bigeminy pattern. - Continue to monitor symptoms and advised follow-up with cardiology and will route this message to cardiology for them to help set up an appointment - Reviewed ED precautions - Advise continuing current medication regimen (Xarelto , metoprolol ) as prescribed since her BP in HR stabilized during this visit

## 2024-09-08 NOTE — Assessment & Plan Note (Addendum)
 Provided in urgent VBCI referral today due to concern for inability to continue working and taking care of herself independently.  Appreciate support and resources as able.  Awaiting MRI for further workup of renal mass which is possibly related to her unintentional weight loss.

## 2024-09-08 NOTE — Telephone Encounter (Signed)
*  STAT* If patient is at the pharmacy, call can be transferred to refill team.   1. Which medications need to be refilled? (please list name of each medication and dose if known) rivaroxaban  (XARELTO ) 20 MG TABS tablet    2. Which pharmacy/location (including street and city if local pharmacy) is medication to be sent to? [id number] [place], [place] - [address] AT [facility] Phone: [phone]  Fax: [phone]      3. Do they need a 30 day or 90 day supply? 90 Pt has 4 pills left

## 2024-09-08 NOTE — Telephone Encounter (Signed)
 Meredith [name]. Meredith [name] request refill of:  Name of Medication(s):  metFORMIN  Last date of OV:  09/01/2024 Pharmacy[facility] [address] [place] [address] [place] [address]  Will route refill request to Team CMA.  Discussed with patient policy to call pharmacy for future refills.  Also, discussed refills may take up to 48 hours to approve or deny.  Meredith [name]

## 2024-09-08 NOTE — Progress Notes (Signed)
    SUBJECTIVE:   CHIEF COMPLAINT / HPI:   Bradycardia - Came for lab visit, but her HR was in the 40s, but fluctuated throughout the encounter, therefore was scheduled in Access To Care today - Endorses some tightness/pressure and mild shortness of breath that comes and goes, but does not persist or radiate. [place] to the ED on 9/23 for her A-fib and had a DCCV - Followed with cardiology afterwards and no medication changes were made at the time  PERTINENT  PMH / PSH: A-fib, CAD, HTN, HLD, hypokalemia  OBJECTIVE:   BP 118/71   Pulse 65   Ht 5' 3 (1.6 m)   SpO2 100%   BMI 33.83 kg/m   General: Awake and Alert in NAD HEENT: NCAT. Sclera anicteric. No rhinorrhea. Cardiovascular: RRR w/ PACs Respiratory: CTAB, normal WOB on RA. No wheezing, crackles, rhonchi, or diminished breath sounds. Extremities: No BLE edema, no deformities or significant joint findings. Skin: Warm and dry. Neuro: No focal neurological deficits.  ASSESSMENT/PLAN:   Assessment & Plan Persistent atrial fibrillation [facility]) Patient was seen in the ED on 9/23 for A-fib with AVR during which she had a DCCV.  She then followed up with Dr. Dorine office at [facility] on 9/26 no medication changes were made at that time and she had similar symptoms at that visit.  She continues to have some intermittent chest pressure, but no pain or radiation and some mild dyspnea.  She is euvolemic on exam and EKG performed today demonstrated NSR with frequent PACs with a bigeminy pattern. - Continue to monitor symptoms and advised follow-up with cardiology and will route this message to cardiology for them to help set up an appointment - Reviewed ED precautions - Advise continuing current medication regimen (Xarelto , metoprolol ) as prescribed since her BP in HR stabilized during this visit Unintentional weight loss Provided in urgent VBCI referral today due to concern for inability to continue working and taking care  of herself independently.  Appreciate support and resources as able.  Awaiting MRI for further workup of renal mass which is possibly related to her unintentional weight loss.   Refilled metformin , Xarelto  and Accu-Chek strips for the patient per her request.  CMP performed for hypokalemia per PCP.  Kathrine Melena, DO [facility]

## 2024-09-08 NOTE — Telephone Encounter (Signed)
 Refilled metformin

## 2024-09-09 ENCOUNTER — Telehealth: Payer: Self-pay

## 2024-09-09 ENCOUNTER — Other Ambulatory Visit: Payer: Self-pay | Admitting: Student

## 2024-09-09 ENCOUNTER — Other Ambulatory Visit: Payer: Self-pay | Admitting: Cardiology

## 2024-09-09 ENCOUNTER — Other Ambulatory Visit: Payer: Self-pay | Admitting: Family Medicine

## 2024-09-09 DIAGNOSIS — I10 Essential (primary) hypertension: Secondary | ICD-10-CM

## 2024-09-09 DIAGNOSIS — G8929 Other chronic pain: Secondary | ICD-10-CM

## 2024-09-09 NOTE — Progress Notes (Signed)
 Complex Care Management Note  Care Guide Note 09/09/2024 Name: Meredith [name] MRN: [id number] DOB: Jul 05, 1951  Meredith [name] is a 73 y.o. year old female who sees Cleotilde Perkins, DO for primary care. I reached out to Dickey MARLA Burr by phone today to offer complex care management services.  Meredith [name] was given information about Complex Care Management services today including:   The Complex Care Management services include support from the care team which includes your Nurse Care Manager, Clinical Social Worker, or Pharmacist.  The Complex Care Management team is here to help remove barriers to the health concerns and goals most important to you. Complex Care Management services are voluntary, and the patient may decline or stop services at any time by request to their care team member.   Complex Care Management Consent Status: Patient agreed to services and verbal consent obtained.   Follow up plan:  Telephone appointment with complex care management team member scheduled for:  BSW 09/14/2024 RNCM 09/22/2024  Encounter Outcome:  Patient Scheduled  Jeoffrey Buffalo , RMA     [place]  [facility], [facility] Guide  Direct Dial: [phone]  Website: [email or web address]

## 2024-09-11 LAB — COMPREHENSIVE METABOLIC PANEL WITH GFR
ALT: 22 IU/L (ref 0–32)
AST: 37 IU/L (ref 0–40)
Albumin: 4.3 g/dL (ref 3.8–4.8)
Alkaline Phosphatase: 45 IU/L — ABNORMAL LOW (ref 49–135)
BUN/Creatinine Ratio: 11 — ABNORMAL LOW (ref 12–28)
BUN: 13 mg/dL (ref 8–27)
Bilirubin Total: 0.6 mg/dL (ref 0.0–1.2)
CO2: 20 mmol/L (ref 20–29)
Calcium: 9.9 mg/dL (ref 8.7–10.3)
Chloride: 99 mmol/L (ref 96–106)
Creatinine, Ser: 1.17 mg/dL — ABNORMAL HIGH (ref 0.57–1.00)
Globulin, Total: 2.9 g/dL (ref 1.5–4.5)
Glucose: 100 mg/dL — ABNORMAL HIGH (ref 70–99)
Potassium: 3.1 mmol/L — ABNORMAL LOW (ref 3.5–5.2)
Sodium: 143 mmol/L (ref 134–144)
Total Protein: 7.2 g/dL (ref 6.0–8.5)
eGFR: 49 mL/min/1.73 — ABNORMAL LOW (ref >59)

## 2024-09-13 ENCOUNTER — Ambulatory Visit (HOSPITAL_COMMUNITY)
Admission: RE | Admit: 2024-09-13 | Discharge: 2024-09-13 | Disposition: A | Discharge status: Home / Self Care | Source: Ambulatory Visit | Attending: Family Medicine | Admitting: Family Medicine

## 2024-09-13 DIAGNOSIS — N2889 Other specified disorders of kidney and ureter: Secondary | ICD-10-CM | POA: Diagnosis not present

## 2024-09-13 DIAGNOSIS — N281 Cyst of kidney, acquired: Secondary | ICD-10-CM | POA: Diagnosis not present

## 2024-09-13 MED ORDER — GADOBUTROL 1 MMOL/ML IV SOLN
8.6000 mL | Freq: Once | INTRAVENOUS | Status: AC | PRN
  Administered 2024-09-13: 8.6 mL via INTRAVENOUS

## 2024-09-14 ENCOUNTER — Ambulatory Visit: Attending: Emergency Medicine | Admitting: Emergency Medicine

## 2024-09-14 ENCOUNTER — Emergency Department (HOSPITAL_COMMUNITY)

## 2024-09-14 ENCOUNTER — Other Ambulatory Visit: Payer: Self-pay

## 2024-09-14 ENCOUNTER — Encounter (HOSPITAL_COMMUNITY): Payer: Self-pay

## 2024-09-14 ENCOUNTER — Emergency Department (HOSPITAL_COMMUNITY)
Admission: EM | Admit: 2024-09-14 | Discharge: 2024-09-14 | Disposition: A | Discharge status: Home / Self Care | Attending: Emergency Medicine | Admitting: Emergency Medicine

## 2024-09-14 ENCOUNTER — Telehealth: Payer: Self-pay

## 2024-09-14 DIAGNOSIS — I10 Essential (primary) hypertension: Secondary | ICD-10-CM | POA: Insufficient documentation

## 2024-09-14 DIAGNOSIS — E119 Type 2 diabetes mellitus without complications: Secondary | ICD-10-CM | POA: Diagnosis not present

## 2024-09-14 DIAGNOSIS — J45909 Unspecified asthma, uncomplicated: Secondary | ICD-10-CM | POA: Insufficient documentation

## 2024-09-14 DIAGNOSIS — R002 Palpitations: Secondary | ICD-10-CM | POA: Insufficient documentation

## 2024-09-14 DIAGNOSIS — I2510 Atherosclerotic heart disease of native coronary artery without angina pectoris: Secondary | ICD-10-CM | POA: Diagnosis not present

## 2024-09-14 DIAGNOSIS — E876 Hypokalemia: Secondary | ICD-10-CM | POA: Diagnosis not present

## 2024-09-14 DIAGNOSIS — R0989 Other specified symptoms and signs involving the circulatory and respiratory systems: Secondary | ICD-10-CM | POA: Diagnosis not present

## 2024-09-14 DIAGNOSIS — Z7984 Long term (current) use of oral hypoglycemic drugs: Secondary | ICD-10-CM | POA: Diagnosis not present

## 2024-09-14 DIAGNOSIS — I771 Stricture of artery: Secondary | ICD-10-CM | POA: Diagnosis not present

## 2024-09-14 DIAGNOSIS — I4819 Other persistent atrial fibrillation: Secondary | ICD-10-CM

## 2024-09-14 DIAGNOSIS — Z79899 Other long term (current) drug therapy: Secondary | ICD-10-CM | POA: Diagnosis not present

## 2024-09-14 DIAGNOSIS — R0602 Shortness of breath: Secondary | ICD-10-CM | POA: Diagnosis not present

## 2024-09-14 LAB — CBC WITH DIFFERENTIAL/PLATELET
Abs Immature Granulocytes: 0.01 K/uL (ref 0.00–0.07)
Basophils Absolute: 0 K/uL (ref 0.0–0.1)
Basophils Relative: 1 %
Eosinophils Absolute: 0.1 K/uL (ref 0.0–0.5)
Eosinophils Relative: 1 %
HCT: 34.8 % — ABNORMAL LOW (ref 36.0–46.0)
Hemoglobin: 11.9 g/dL — ABNORMAL LOW (ref 12.0–15.0)
Immature Granulocytes: 0 %
Lymphocytes Relative: 26 %
Lymphs Abs: 1.5 K/uL (ref 0.7–4.0)
MCH: 26.1 pg (ref 26.0–34.0)
MCHC: 34.2 g/dL (ref 30.0–36.0)
MCV: 76.3 fL — ABNORMAL LOW (ref 80.0–100.0)
Monocytes Absolute: 0.5 K/uL (ref 0.1–1.0)
Monocytes Relative: 9 %
Neutro Abs: 3.7 K/uL (ref 1.7–7.7)
Neutrophils Relative %: 63 %
Platelets: 183 K/uL (ref 150–400)
RBC: 4.56 MIL/uL (ref 3.87–5.11)
RDW: 15.2 % (ref 11.5–15.5)
WBC: 5.8 K/uL (ref 4.0–10.5)
nRBC: 0 % (ref 0.0–0.2)

## 2024-09-14 LAB — BASIC METABOLIC PANEL WITH GFR
Anion gap: 13 (ref 5–15)
Anion gap: 14 (ref 5–15)
BUN: 16 mg/dL (ref 8–23)
BUN: 12 mg/dL (ref 8–23)
CO2: 27 mmol/L (ref 22–32)
CO2: 24 mmol/L (ref 22–32)
Calcium: 9.4 mg/dL (ref 8.9–10.3)
Calcium: 9.2 mg/dL (ref 8.9–10.3)
Chloride: 101 mmol/L (ref 98–111)
Chloride: 104 mmol/L (ref 98–111)
Creatinine, Ser: 1.01 mg/dL — ABNORMAL HIGH (ref 0.44–1.00)
Creatinine, Ser: 0.96 mg/dL (ref 0.44–1.00)
GFR, Estimated: 58 mL/min — ABNORMAL LOW (ref >60)
GFR, Estimated: >60 mL/min (ref >60)
Glucose, Bld: 132 mg/dL — ABNORMAL HIGH (ref 70–99)
Glucose, Bld: 93 mg/dL (ref 70–99)
Potassium: 2.4 mmol/L — CL (ref 3.5–5.1)
Potassium: 3.7 mmol/L (ref 3.5–5.1)
Sodium: 141 mmol/L (ref 135–145)
Sodium: 142 mmol/L (ref 135–145)

## 2024-09-14 LAB — TROPONIN I (HIGH SENSITIVITY)
Troponin I (High Sensitivity): 3 ng/L (ref <18)
Troponin I (High Sensitivity): 4 ng/L (ref <18)

## 2024-09-14 MED ORDER — POTASSIUM CHLORIDE CRYS ER 20 MEQ PO TBCR
40.0000 meq | EXTENDED_RELEASE_TABLET | Freq: Once | ORAL | Status: AC
  Administered 2024-09-14: 40 meq via ORAL
  Filled 2024-09-14: qty 2

## 2024-09-14 MED ORDER — RIVAROXABAN 20 MG PO TABS: 20.0000 mg | ORAL_TABLET | Freq: Every day | ORAL | 0 refills | Status: DC

## 2024-09-14 MED ORDER — POTASSIUM CHLORIDE 10 MEQ/100ML IV SOLN
10.0000 meq | INTRAVENOUS | Status: AC
  Administered 2024-09-14 (×3): 10 meq via INTRAVENOUS
  Filled 2024-09-14: qty 100

## 2024-09-14 NOTE — Discharge Instructions (Addendum)
Follow up with your family doc in the office.  

## 2024-09-14 NOTE — ED Notes (Signed)
 Pt ambulated to restroom without assistance.

## 2024-09-14 NOTE — ED Provider Triage Note (Signed)
 Emergency Medicine Provider Triage Evaluation Note  MACHEL VIOLANTE , a 73 y.o. female  was evaluated in triage.  Pt complains of elevated BP reading.  States she had to hold her BP meds x2 days for an MRI.  Tonight felt lightheaded but did not pass out.  Denies headaches, chest pain, etc.  Review of Systems  Positive: lightheadedness Negative: Chest pain, SOB, headache  Physical Exam  BP (!) 145/77   Pulse 72   Temp 97.7 F (36.5 C) (Oral)   Resp 18   SpO2 100%  Gen:   Awake, no distress   Resp:  Normal effort  MSK:   Moves extremities without difficulty  Other:    Medical Decision Making  Medically screening exam initiated at 1:59 AM.  Appropriate orders placed.  AREONNA BRAN was informed that the remainder of the evaluation will be completed by another provider, this initial triage assessment does not replace that evaluation, and the importance of remaining in the ED until their evaluation is complete.  Lightheadedness.  BP minimally elevated here.  Will check basic labs, EKG.   Jarold Olam HERO, PA-C 09/14/24 0200

## 2024-09-14 NOTE — ED Provider Notes (Signed)
 [place] EMERGENCY DEPARTMENT AT [facility] Provider Note   CSN: [id number] Arrival date & time: 09/14/24  0118     Patient presents with: Hypertension   Meredith [name] is a 73 y.o. female.   HPI     This is a 73 year old female who presents with concerns for high blood pressure readings.  Patient reports that she stopped taking her blood pressure medications for 2 days because she had an MRI.  She was told to stop these medications.  She is concerned because she began to feel lightheaded and palpitations.  No chest pain.  No shortness of breath.  Felt like her blood pressure was elevated.  It was in the 160s systolic.  Prior to Admission medications   Medication Sig Start Date End Date Taking? Authorizing Provider  acetaminophen  (ACETAMINOPHEN  8 HOUR) 650 MG CR tablet Take 1 tablet (650 mg total) by mouth every 8 (eight) hours. Patient not taking: Reported on 09/08/2024 10/22/18   Mesner, Selinda, MD  amLODipine -valsartan  (EXFORGE ) 5-160 MG tablet TAKE 1 TABLET BY MOUTH EVERY DAY 06/01/24   Cleotilde Perkins, DO  Bempedoic Acid-Ezetimibe  (NEXLIZET ) 180-10 MG TABS Take 1 tablet by mouth daily. 02/13/24   Shlomo Wilbert SAUNDERS, MD  Blood Glucose Monitoring Suppl (ACCU-CHEK AVIVA PLUS) w/Device KIT 1 kit by Does not apply route as directed. 09/08/24   Gomes, Adriana, DO  diclofenac  sodium (VOLTAREN ) 1 % GEL Apply 2 g topically 4 (four) times daily. 08/27/19   Benjamine Hamilton, DO  fexofenadine  (ALLEGRA ) 180 MG tablet Take 1 tablet (180 mg total) by mouth daily as needed for allergies. Patient not taking: Reported on 09/08/2024 06/03/24   McDiarmid, Krystal BIRCH, MD  INVOKANA  100 MG TABS tablet TAKE 1 TABLET BY MOUTH DAILY BEFORE BREAKFAST. 02/11/24   Cleotilde Perkins, DO  metFORMIN  (GLUCOPHAGE ) 1000 MG tablet Take 1 tablet (1,000 mg total) by mouth 2 (two) times daily with a meal. 09/08/24   Cleotilde Perkins, DO  olopatadine  (PATADAY ) 0.1 % ophthalmic solution Place 1 drop into both eyes 2 (two) times daily.  06/17/24   McDiarmid, Krystal BIRCH, MD  potassium chloride  (KLOR-CON  M) 10 MEQ tablet Take 1 tablet (10 mEq total) by mouth daily. For 7 days. Patient not taking: Reported on 09/08/2024 08/20/24   McDiarmid, Krystal BIRCH, MD  potassium chloride  (KLOR-CON ) 10 MEQ tablet Take 20 mEq by mouth 2 (two) times daily. Patient not taking: Reported on 09/08/2024 07/08/24   [provider]  rivaroxaban  (XARELTO ) 20 MG TABS tablet Take 1 tablet (20 mg total) by mouth daily. 09/08/24   Gomes, Adriana, DO  rosuvastatin  (CRESTOR ) 40 MG tablet Take 1 tablet (40 mg total) by mouth daily. 02/13/24   Shlomo Wilbert SAUNDERS, MD  spironolactone  (ALDACTONE ) 25 MG tablet Take 25 mg by mouth daily. 08/29/24   [provider]  spironolactone  (ALDACTONE ) 50 MG tablet TAKE 1 TABLET BY MOUTH EVERYDAY AT BEDTIME 09/09/24   Cleotilde Perkins, DO  tiZANidine  (ZANAFLEX ) 4 MG tablet Take 1 tablet (4 mg total) by mouth at bedtime. 08/19/24   Howell Lunger, DO    Allergies: Lisinopril , Doxycycline , Flexeril [cyclobenzaprine hcl], Lipitor [atorvastatin calcium ], and Metaxalone    Review of Systems  Constitutional:  Negative for fever.  Respiratory:  Negative for cough and shortness of breath.   Cardiovascular:  Positive for palpitations. Negative for chest pain.  Neurological:  Positive for light-headedness.  All other systems reviewed and are negative.   Updated Vital Signs BP (!) 154/91 (BP Location: Right Arm)  Pulse 85   Temp 97.9 F (36.6 C) (Oral)   Resp 17   SpO2 100%   Physical Exam Vitals and nursing note reviewed.  Constitutional:      Appearance: She is well-developed. She is obese. She is not ill-appearing.  HENT:     Head: Normocephalic and atraumatic.  Eyes:     Pupils: Pupils are equal, round, and reactive to light.  Cardiovascular:     Rate and Rhythm: Normal rate and regular rhythm.     Heart sounds: Normal heart sounds.  Pulmonary:     Effort: Pulmonary effort is normal. No respiratory distress.      Breath sounds: No wheezing.  Abdominal:     Palpations: Abdomen is soft.     Tenderness: There is no abdominal tenderness. There is no guarding or rebound.  Musculoskeletal:     Cervical back: Neck supple.  Skin:    General: Skin is warm and dry.  Neurological:     Mental Status: She is alert and oriented to person, place, and time.  Psychiatric:        Mood and Affect: Mood normal.     (all labs ordered are listed, but only abnormal results are displayed) Labs Reviewed  CBC WITH DIFFERENTIAL/PLATELET - Abnormal; Notable for the following components:      Result Value   Hemoglobin 11.9 (*)    HCT 34.8 (*)    MCV 76.3 (*)    All other components within normal limits  BASIC METABOLIC PANEL WITH GFR - Abnormal; Notable for the following components:   Potassium 2.4 (*)    Glucose, Bld 132 (*)    Creatinine, Ser 1.01 (*)    GFR, Estimated 58 (*)    All other components within normal limits  TROPONIN I (HIGH SENSITIVITY)  TROPONIN I (HIGH SENSITIVITY)    EKG: EKG Interpretation Date/Time:  Tuesday September 14 2024 01:27:19 EDT Ventricular Rate:  78 PR Interval:    QRS Duration:  116 QT Interval:  392 QTC Calculation: 446 R Axis:   -42  Text Interpretation: NSR with frequent PACs Left ventricular hypertrophy with QRS widening ( R in aVL , Cornell product ) Cannot rule out Septal infarct , age undetermined Abnormal ECG When compared with ECG of 08-Sep-2024 11:29, PREVIOUS ECG IS PRESENT Confirmed by Bari Pfeiffer ([id number]) on 09/14/2024 6:36:40 AM  Radiology: [facility] Chest Portable 1 View Result Date: 09/14/2024 EXAM: 1 VIEW(S) XRAY OF THE CHEST 09/14/2024 05:27:53 AM COMPARISON: 08/31/2024 CLINICAL HISTORY: palpitations. palpittions FINDINGS: LUNGS AND PLEURA: Low lung volumes. No focal pulmonary opacity. No pulmonary edema. No pleural effusion. No pneumothorax. HEART AND MEDIASTINUM: Rotational artifact. Aortic tortuosity. No acute abnormality of the cardiac and mediastinal  silhouettes. BONES AND SOFT TISSUES: No acute osseous abnormality. IMPRESSION: 1. No acute cardiopulmonary process. 2. Low lung volumes and rotational artifact which may accentuate mediastinal contours. 3. Aortic tortuosity, likely chronic. Electronically signed by: Waddell Calk MD 09/14/2024 06:04 AM EDT RP Workstation: [id number]   MR Abdomen W Tommye Contrast Result Date: 09/13/2024 CLINICAL DATA:  Renal mass/cyst, indeterminate. EXAM: MRI ABDOMEN WITHOUT AND WITH CONTRAST TECHNIQUE: Multiplanar multisequence MR imaging of the abdomen was performed both before and after the administration of intravenous contrast. CONTRAST:  8.81mL GADAVIST  GADOBUTROL  1 MMOL/ML IV SOLN COMPARISON:  CT scan abdomen and pelvis from 08/29/2024. FINDINGS: Lower chest: Unremarkable MR appearance to the lung bases. No pleural effusion. No pericardial effusion. Normal heart size. Hepatobiliary: The liver is normal in size noting probable Riedel's lobe/tongue  like inferior projection of the right lobe. Noncirrhotic configuration. No focal mass. No intrahepatic or extrahepatic bile duct dilatation. No choledocholithiasis. Unremarkable gallbladder. Pancreas: Normal T1 hyperintense appearance of the pancreas. There are multiple sub-5 mm, T2 hyperintense foci throughout the pancreas (marked with electronic arrow sign on series 10). These are incompletely characterized on this exam but favored to represent side branch IPMN versus dilated pancreatic side branches. No suspicious lesion. Main pancreatic duct is not dilated. No peripancreatic fat stranding. Spleen:  Within normal limits in size and appearance. No focal mass. Adrenals/Urinary Tract: Unremarkable adrenal glands. No hydroureteronephrosis. There are several simple cysts mainly in the left kidney with largest partially exophytic cyst arising from the left kidney lower pole measuring up to 3.0 x 3.2 cm. There is a T1 hyperintense intra cortical 1.3 x 1.4 cm structure arising from the  right kidney upper pole, anteromedially which corresponds to the structure described on the recent CT scan abdomen and pelvis. The structure is hypointense on T2 weighted images and does not exhibit contrast enhancement and is compatible with a proteinaceous/hemorrhagic cyst. No suspicious renal lesion. Stomach/Bowel: Visualized portions within the abdomen are unremarkable. No disproportionate dilation of bowel loops. Unremarkable appendix. Vascular/Lymphatic: No pathologically enlarged lymph nodes identified. No abdominal aortic aneurysm demonstrated. No ascites. Other:  None. Musculoskeletal: No suspicious bone lesions identified. IMPRESSION: 1. There is a proteinaceous/hemorrhagic cyst in the right kidney upper pole, anteromedially corresponding to the structure described on the recent CT scan abdomen and pelvis. No suspicious renal lesion. 2. 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 Follow-up examination with MRI abdomen with MRCP protocol is recommended in 2 year. 3. Multiple other nonacute observations, as described above. Electronically Signed   By: Ree Molt M.D.   On: 09/13/2024 11:54     Procedures   Medications Ordered in the ED  potassium chloride  10 mEq in 100 mL IVPB (10 mEq Intravenous New Bag/Given 09/14/24 0629)  potassium chloride  SA (KLOR-CON  M) CR tablet 40 mEq (40 mEq Oral Given 09/14/24 0518)                                    Medical Decision Making Amount and/or Complexity of Data Reviewed Radiology: ordered.  Risk Prescription drug management.   This patient presents to the ED for concern of palpitations, hypertension, this involves an extensive number of treatment options, and is a complaint that carries with it a high risk of complications and morbidity.  I considered the following differential and admission for this  acute, potentially life threatening condition.  The differential diagnosis includes arrhythmia, metabolic derangement, hypertensive urgency, hypertensive emergency, ACS  MDM:    This is a 72 year old female who presents with palpitations.  Concerned about her blood pressure.  Has not taken her blood pressure medications in several days.  Notably hypokalemic with a potassium of 2.4.  Reports history of the same but they keep putting me on potassium and taking me off.  No active chest pain.  EKG shows sinus rhythm with frequent PACs which appears similar to prior.  Chest x-ray without pneumothorax pneumonia.  Other labs largely reassuring.  Blood pressure last 154/91.  Initial Trop negative.  Will need repeat Trop and potassium replacement.  If improves may be able to go home after potassium supplementation.  (Labs, imaging, consults)  Labs: I Ordered, and personally interpreted labs.  The pertinent results include: CBC and BMP, troponin  Imaging Studies ordered: I ordered imaging studies including x-ray I independently visualized and interpreted imaging. I agree with the radiologist interpretation  Additional history obtained from chart review.  External records from outside source obtained and reviewed including prior evaluations  Cardiac Monitoring: The patient was maintained on a cardiac monitor.  If on the cardiac monitor, I personally viewed and interpreted the cardiac monitored which showed an underlying rhythm of: Sinus with PACs  Reevaluation: After the interventions noted above, I reevaluated the patient and found that they have :stayed the same  Social Determinants of Health:  lives independently  Disposition: Pending, signed out to oncoming provider  Co morbidities that complicate the patient evaluation  Past Medical History:  Diagnosis Date   ANEMIA, IRON DEFICIENCY, UNSPEC. 02/05/2007   Qualifier: Diagnosis of  By: Cleatus MD, Arlyss     ASTHMA, INTERMITTENT 09/07/2010    Qualifier: Diagnosis of  By: Gwenyth MD, [place]     CAD (coronary artery disease), native coronary artery 02/16/2015   Cath with 20% LCx and RCA   Colon polyps    CTS (carpal tunnel syndrome) 06/06/2016   Diabetes mellitus    Diverticulosis 05/17/12   DJD (degenerative joint disease) of lumbar spine    Enteritis    Flu vaccine need 09/19/2022   Gastric ulcer 04/2000   Hyperlipidemia    Hypertension    Irregular heart beat    Obesity    Osteoarthritis    Persistent atrial fibrillation (HCC)    CHADS2VASC score is 5 and on Xarelto    Pruritus 02/21/2021   Rash 02/15/2021   Renal cyst, left 05/17/12   Ventral hernia      Medicines Meds ordered this encounter  Medications   potassium chloride  10 mEq in 100 mL IVPB   potassium chloride  SA (KLOR-CON  M) CR tablet 40 mEq    I have reviewed the patients home medicines and have made adjustments as needed  Problem List / ED Course: Problem List Items Addressed This Visit       Other   Hypokalemia - Primary   Other Visit Diagnoses       Palpitations                    Final diagnoses:  Hypokalemia  Palpitations    ED Discharge Orders     None          Bari Charmaine FALCON, MD 09/14/24 [phone]

## 2024-09-14 NOTE — ED Triage Notes (Addendum)
 Pt presents via EMS from home c/o HTN. Denies other symptoms. Pt has hx of HTN. Reports not taking BP meds in 2 days.

## 2024-09-14 NOTE — ED Notes (Signed)
 MSE completed by Olam PA. Pt reports light headed feeling prior to EMS arrival so blood work was added additionally. OK for lobby per [place] PA after blood drawn.

## 2024-09-14 NOTE — Progress Notes (Deleted)
  Cardiology Office Note:    Date:  09/14/2024  ID:  Meredith [name], DOB 1951/03/05, MRN [id number] PCP: Cleotilde Perkins, DO  [facility] Providers Cardiologist:  Wilbert Bihari, MD { Click to update primary MD,subspecialty MD or APP then REFRESH:1}    {Click to Open Review  :1}   Patient Profile:       Chief Complaint: *** History of Present Illness:  Meredith [name] is a 73 y.o. female with visit-pertinent history of nonobstructive coronary artery disease, persistent atrial fibrillation, atrial tachycardia, hypertension, hyperlipidemia  Cardiac catheterization 2016 in the setting of abnormal stress test revealed 20% proximal LCx stenosis, 20% OM1 stenosis, 20% RCA stenosis, EF 55 to 60%, mild nonobstructive CAD.  She has a history of persistent atrial fibrillation on Xarelto .  Seen in clinic on 02/13/2024 and stable from a cardiac output.  Blood pressure was elevated and Toprol  was increased to 75 mg daily in the setting of palpitations.  She was seen in the ED on 08/31/2024 in the setting of shortness of breath, dizziness and noted to have recurrent atrial fibrillation.  She underwent DCCV with restoration of normal sinus rhythm.  Troponins were found to be mildly elevated.  This was thought to be in the setting of a demand ischemia from atrial fibrillation with RVR.  She was last seen in clinic on 09/03/2024 for follow-up.  She reported intermittent fleeting palpitations, mild dyspnea on exertion.  EKG showed sinus rhythm with frequent PACs.  She was euvolemic and well compensated on exam.  No medication changes were made she was to follow-up in 2 to 3 months.  Came in for a lab visit with PCP on 09/08/2024.  It was reported her heart rate was in the 40s fluctuated throughout the encounter.  She was to follow-up with cardiology  Discussed the use of AI scribe software for clinical note transcription with the patient, who gave verbal consent to proceed.  History of Present  Illness     Review of systems:  Please see the history of present illness. All other systems are reviewed and otherwise negative. ***      Studies Reviewed:        ***  Risk Assessment/Calculations:   {Does this patient have ATRIAL FIBRILLATION?:[phone]}          Physical Exam:   VS:  There were no vitals taken for this visit.   Wt Readings from Last 3 Encounters:  09/03/24 191 lb (86.6 kg)  09/01/24 190 lb 6.4 oz (86.4 kg)  08/31/24 197 lb (89.4 kg)    GEN: Well nourished, well developed in no acute distress NECK: No JVD; No carotid bruits CARDIAC: ***RRR, no murmurs, rubs, gallops RESPIRATORY:  Clear to auscultation without rales, wheezing or rhonchi  ABDOMEN: Soft, non-tender, non-distended EXTREMITIES:  No edema; No acute deformity ***      Assessment and Plan:    Assessment and Plan Assessment & Plan      {Are you ordering a CV Procedure (e.g. stress test, cath, DCCV, TEE, etc)?   Press F2        :[phone]}  Dispo:  No follow-ups on file.  Signed, Lum LITTIE Louis, NP

## 2024-09-14 NOTE — ED Notes (Signed)
 X-ray at bedside.

## 2024-09-14 NOTE — ED Provider Notes (Signed)
 Received patient in turnover from Dr. Bari.  Please see their note for further details of Hx, PE.  Briefly patient is a 73 y.o. female with a Hypertension .  Hypokalemia.  Hx of same. Plan for repeat trop, K replenishment.  If improving likely will try and sent home with oral K supplementation.  potassium is actually in the normal range on repeat check.  Will discharge home.  I discussed this with the patient and she was asking for refill of her Xarelto  as well.    Emil Share, DO 09/14/24 1021

## 2024-09-14 NOTE — ED Notes (Signed)
 Light green tube sent as BMP hemolyzed

## 2024-09-16 ENCOUNTER — Ambulatory Visit

## 2024-09-16 NOTE — Therapy (Incomplete)
 OUTPATIENT PHYSICAL THERAPY THORACOLUMBAR EVALUATION   Patient Name: Meredith [name] MRN: [id number] DOB:Nov 28, 1951, 73 y.o., female Today's Date: 09/16/2024  END OF SESSION:   Past Medical History:  Diagnosis Date   ANEMIA, IRON DEFICIENCY, UNSPEC. 02/05/2007   Qualifier: Diagnosis of  By: Cleatus MD, Arlyss     ASTHMA, INTERMITTENT 09/07/2010   Qualifier: Diagnosis of  By: Gwenyth MD, [place]     CAD (coronary artery disease), native coronary artery 02/16/2015   Cath with 20% LCx and RCA   Colon polyps    CTS (carpal tunnel syndrome) 06/06/2016   Diabetes mellitus    Diverticulosis 05/17/12   DJD (degenerative joint disease) of lumbar spine    Enteritis    Flu vaccine need 09/19/2022   Gastric ulcer 04/2000   Hyperlipidemia    Hypertension    Irregular heart beat    Obesity    Osteoarthritis    Persistent atrial fibrillation (HCC)    CHADS2VASC score is 5 and on Xarelto    Pruritus 02/21/2021   Rash 02/15/2021   Renal cyst, left 05/17/12   Ventral hernia    Past Surgical History:  Procedure Laterality Date   ABDOMINAL HYSTERECTOMY     LEFT HEART CATHETERIZATION WITH CORONARY ANGIOGRAM N/A 02/16/2015   Procedure: LEFT HEART CATHETERIZATION WITH CORONARY ANGIOGRAM;  Surgeon: Lonni JONETTA Cash, MD;  Location: [facility] CATH LAB;  Service: Cardiovascular;  Laterality: N/A;   OPERATIVE HYSTEROSCOPY     TUBAL LIGATION     UTERINE FIBROID SURGERY     Patient Active Problem List   Diagnosis Date Noted   Unintentional weight loss 09/01/2024   Chronic kidney disease (CKD) 01/23/2024   Trigger finger of left hand 10/31/2023   Dizziness 02/07/2022   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   Posterior neck pain 11/03/2021   Torticollis, acute    Ingrown nail 08/16/2021   Tear of rotator cuff 05/04/2021   Leg pain, lateral, right 12/31/2019   Hypokalemia 12/04/2018   Restless leg syndrome 12/04/2018   Osteopenia 09/15/2017   Pain of both hip joints 06/13/2017   Knee pain, chronic 07/21/2016    Carpal tunnel syndrome 06/06/2016   Numbness of fingers 04/06/2016   Estrogen deficiency 03/29/2016   CAD (coronary artery disease), native coronary artery 10/26/2015   Back pain 08/17/2015   Persistent atrial fibrillation (HCC) 02/23/2015   PAC (premature atrial contraction) 01/19/2015   HERNIA, VENTRAL 04/18/2010   DEGENERATIVE DISC DISEASE, LUMBAR SPINE 04/18/2010   Diabetes mellitus type II, controlled, with no complications (HCC) 02/05/2007   HYPERCHOLESTEROLEMIA 02/05/2007   Obesity 02/05/2007   DEPRESSION, MAJOR, RECURRENT 02/05/2007   HYPERTENSION, BENIGN SYSTEMIC 02/05/2007   GASTROESOPHAGEAL REFLUX, NO ESOPHAGITIS 02/05/2007   OSTEOARTHRITIS, MULTI SITES 02/05/2007    PCP: Damien Pinal, DO  REFERRING PROVIDER: Donald CHRISTELLA Lai, DO  REFERRING DIAG: M54.50,G89.29 (ICD-10-CM) - Chronic bilateral low back pain without sciatica G56.01 (ICD-10-CM) - Carpal tunnel syndrome of right wrist  Rationale for Evaluation and Treatment: Rehabilitation  THERAPY DIAG:  No diagnosis found.  ONSET DATE: ***  SUBJECTIVE:  SUBJECTIVE STATEMENT: ***  PERTINENT HISTORY:  ***  PAIN:  Are you having pain? Yes: NPRS scale: *** Pain location: *** Pain description: *** Aggravating factors: *** Relieving factors: ***  PRECAUTIONS: {Therapy precautions:24002}  RED FLAGS: {PT Red Flags:29287}   WEIGHT BEARING RESTRICTIONS: {Yes ***/No:24003}  FALLS:  Has patient fallen in last 6 months? {fallsyesno:27318}  LIVING ENVIRONMENT: Lives with: {[facility] lives with:25569::lives with their family} Lives in: {Lives in:25570} Stairs: {opstairs:27293} Has following equipment at home: {Assistive devices:23999}  OCCUPATION: ***  PLOF: {PLOF:24004}  PATIENT GOALS: ***  NEXT MD VISIT: ***  OBJECTIVE:   Note: Objective measures were completed at Evaluation unless otherwise noted.  DIAGNOSTIC FINDINGS:  ***  PATIENT SURVEYS:  PSFS: THE PATIENT SPECIFIC FUNCTIONAL SCALE  Place score of 0-10 (0 = unable to perform activity and 10 = able to perform activity at the same level as before injury or problem)  Activity Date: 09/16/2024         2.     3.     4.      Total Score ***      Total Score = Sum of activity scores/number of activities  Minimally Detectable Change: 3 points (for single activity); 2 points (for average score)  [facility] (nd). The Patient Specific Functional Scale . Retrieved from [email or web address]   COGNITION: Overall cognitive status: {cognition:24006}     SENSATION: {sensation:27233}  MUSCLE LENGTH: Hamstrings: Right *** deg; Left *** deg Debby test: Right *** deg; Left *** deg  POSTURE: {posture:25561}  PALPATION: ***  LUMBAR ROM:   AROM Eval 09/16/2024  Flexion   Extension   Right lateral flexion   Left lateral flexion   Right rotation   Left rotation    (Blank rows = not tested)  LOWER EXTREMITY ROM:     {AROM/PROM:27142}  Right Eval 09/16/2024 Left Eval 09/16/2024  Hip flexion    Hip extension    Hip abduction    Hip adduction    Hip internal rotation    Hip external rotation    Knee flexion    Knee extension    Ankle dorsiflexion    Ankle plantarflexion    Ankle inversion    Ankle eversion     (Blank rows = not tested)  LOWER EXTREMITY MMT:    MMT Right Eval 09/16/2024 Left Eval 09/16/2024  Hip flexion    Hip extension    Hip abduction    Hip adduction    Hip internal rotation    Hip external rotation    Knee flexion    Knee extension    Ankle dorsiflexion    Ankle plantarflexion    Ankle inversion    Ankle eversion     (Blank rows = not tested)  LUMBAR SPECIAL TESTS:  {lumbar special test:25242}  FUNCTIONAL TESTS:  {Functional  tests:24029}  GAIT: Distance walked: *** Assistive device utilized: {Assistive devices:23999} Level of assistance: {Levels of assistance:24026} Comments: ***  TREATMENT DATE:  09/16/2024 TherEx:  HEP handout provided with patient performing one set of each activity for appropriate form. Verbal and tactile cues provided.   Self-Care:  POC, etc. ***  PATIENT EDUCATION:  Education details: *** Person educated: {Person educated:25204} Education method: {Education Method:25205} Education comprehension: {Education Comprehension:25206}  HOME EXERCISE PROGRAM: ***  ASSESSMENT:  CLINICAL IMPRESSION: Patient is a 72 y.o. F who was seen today for physical therapy evaluation and treatment for chronic low back pain presenting with ***. Patient will benefit from skilled PT to address above noted deficits.   OBJECTIVE IMPAIRMENTS: {opptimpairments:25111}.   ACTIVITY LIMITATIONS: {activitylimitations:27494}  PARTICIPATION LIMITATIONS: {participationrestrictions:25113}  PERSONAL FACTORS: {Personal factors:25162} are also affecting patient's functional outcome.   REHAB POTENTIAL: {rehabpotential:25112}  CLINICAL DECISION MAKING: {clinical decision making:25114}  EVALUATION COMPLEXITY: {Evaluation complexity:25115}   GOALS: Goals reviewed with patient? Yes  SHORT TERM GOALS: Target date: ***  *** Baseline: Goal status: INITIAL  2.  *** Baseline:  Goal status: INITIAL  3.  *** Baseline:  Goal status: INITIAL  4.  *** Baseline:  Goal status: INITIAL  5.  *** Baseline:  Goal status: INITIAL  6.  *** Baseline:  Goal status: INITIAL  LONG TERM GOALS: Target date: ***  *** Baseline:  Goal status: INITIAL  2.  *** Baseline:  Goal status: INITIAL  3.  *** Baseline:  Goal status: INITIAL  4.  *** Baseline:  Goal status:  INITIAL  5.  *** Baseline:  Goal status: INITIAL  6.  *** Baseline:  Goal status: INITIAL  PLAN:  PT FREQUENCY: {rehab frequency:25116}  PT DURATION: {rehab duration:25117}  PLANNED INTERVENTIONS: 97164- PT Re-evaluation, 97750- Physical Performance Testing, 97110-Therapeutic exercises, 97530- Therapeutic activity, [id number]- Neuromuscular re-education, 97535- Self Care, 02859- Manual therapy, [id number]- Gait training, [id number]- Orthotic Initial, [id number]- Orthotic/Prosthetic subsequent, [id number]- Canalith repositioning, [id number]- Aquatic Therapy, [email or web address]- Electrical stimulation (unattended), [phone]- Electrical stimulation (manual), [id number]- Vasopneumatic device, [id number]- Ultrasound, [id number]- Traction (mechanical), [id number]- Ionotophoresis 4mg /ml Dexamethasone, 79439 (1-2 muscles), 20561 (3+ muscles)- Dry Needling, Patient/Family education, Balance training, Stair training, Taping, Joint mobilization, Joint manipulation, Spinal manipulation, Spinal mobilization, Scar mobilization, Vestibular training, DME instructions, Cryotherapy, and Moist heat.  PLAN FOR NEXT SESSION: ***   Susannah Daring, PT, DPT 09/16/24 7:55 AM

## 2024-09-16 NOTE — Progress Notes (Signed)
 Meredith [name]                                          MRN: [id number]   09/16/2024   The [facility] Quality Team Specialist reviewed this patient medical record for the purposes of chart review for care gap closure. The following were reviewed: abstraction for care gap closure-controlling blood pressure.    [facility] Quality Team

## 2024-09-17 ENCOUNTER — Other Ambulatory Visit: Payer: Self-pay

## 2024-09-17 NOTE — Patient Instructions (Signed)
 Visit Information  Thank you for taking time to visit with me today. Please don't hesitate to contact me if I can be of assistance to you before our next scheduled appointment.  Our next appointment is by telephone on 10/07/24 at 11:30: Please call the care guide team at [phone] if you need to cancel or reschedule your appointment.   Following is a copy of your care plan:   Goals Addressed   None     Please call the Suicide and Crisis Lifeline: 988 call the [place]  National Suicide Prevention Lifeline: 540 708 2001 or TTY: (854)239-2256 TTY (415)483-3217) to talk to a trained counselor call 1-800-273-TALK (toll free, 24 hour hotline) go to [facility] [address] [place] [phone]) call 911 if you are experiencing a Mental Health or Behavioral Health Crisis or need someone to talk to.  The patient verbalized understanding of instructions, educational materials, and care plan provided today and DECLINED offer to receive copy of patient instructions, educational materials, and care plan.   Thersia Hoar, HEDWIG, MHA [place]  [facility] Social Worker, Population Health [phone]

## 2024-09-17 NOTE — Patient Outreach (Addendum)
 Complex Care Management   Visit Note  09/17/2024  Name:  Meredith [name] MRN: [id number] DOB: 1951-01-29  Situation: Referral received for Complex Care Management related to Medications I obtained verbal consent from Patient.  Visit completed with Patient  on the phone  Background:   Past Medical History:  Diagnosis Date   ANEMIA, IRON DEFICIENCY, UNSPEC. 02/05/2007   Qualifier: Diagnosis of  By: Cleatus MD, Arlyss     ASTHMA, INTERMITTENT 09/07/2010   Qualifier: Diagnosis of  By: Gwenyth MD, [place]     CAD (coronary artery disease), native coronary artery 02/16/2015   Cath with 20% LCx and RCA   Colon polyps    CTS (carpal tunnel syndrome) 06/06/2016   Diabetes mellitus    Diverticulosis 05/17/12   DJD (degenerative joint disease) of lumbar spine    Enteritis    Flu vaccine need 09/19/2022   Gastric ulcer 04/2000   Hyperlipidemia    Hypertension    Irregular heart beat    Obesity    Osteoarthritis    Persistent atrial fibrillation (HCC)    CHADS2VASC score is 5 and on Xarelto    Pruritus 02/21/2021   Rash 02/15/2021   Renal cyst, left 05/17/12   Ventral hernia     Assessment: SW completed a telephone outreach with patient, she states she needs all of her medications. She did go to [facility] to pick them up but they stated her PCP needed to send them over. SW will message PCP for all medications to be refilled.  SW and patient briefly discussed disability and SW will mail resources for the disability advocacy center. Patient states she does not want to go into assisted living, she is able to take care of herself at home and she lives in her mothers house. Patient states her main concern is getting her medication and did not want to answer any further questions.   SDOH Interventions    Flowsheet Row Clinical Support from 07/11/2023 in [facility] [place] Telephone from 09/09/2022 in [facility] Coordination  SDOH  Interventions    Food Insecurity Interventions Intervention Not Indicated --  Housing Interventions Intervention Not Indicated Intervention Not Indicated  Transportation Interventions Intervention Not Indicated Intervention Not Indicated  Utilities Interventions Intervention Not Indicated --  Alcohol Usage Interventions Intervention Not Indicated (Score <7) --  Financial Strain Interventions Intervention Not Indicated --  Physical Activity Interventions Intervention Not Indicated --  Stress Interventions Intervention Not Indicated --  Social Connections Interventions Intervention Not Indicated --  Health Literacy Interventions Intervention Not Indicated --      Recommendation:   No recommendations at this time  Follow Up Plan:   Telephone follow-up 10/07/24 at 11:30  Thersia Hoar, BSW, MHA [place]  [facility] Social Worker, Population Health [phone]

## 2024-09-21 DIAGNOSIS — E785 Hyperlipidemia, unspecified: Secondary | ICD-10-CM | POA: Diagnosis not present

## 2024-09-21 DIAGNOSIS — E1136 Type 2 diabetes mellitus with diabetic cataract: Secondary | ICD-10-CM | POA: Diagnosis not present

## 2024-09-21 DIAGNOSIS — I129 Hypertensive chronic kidney disease with stage 1 through stage 4 chronic kidney disease, or unspecified chronic kidney disease: Secondary | ICD-10-CM | POA: Diagnosis not present

## 2024-09-21 DIAGNOSIS — E1122 Type 2 diabetes mellitus with diabetic chronic kidney disease: Secondary | ICD-10-CM | POA: Diagnosis not present

## 2024-09-21 DIAGNOSIS — N182 Chronic kidney disease, stage 2 (mild): Secondary | ICD-10-CM | POA: Diagnosis not present

## 2024-09-21 DIAGNOSIS — Z9989 Dependence on other enabling machines and devices: Secondary | ICD-10-CM | POA: Diagnosis not present

## 2024-09-21 DIAGNOSIS — M62838 Other muscle spasm: Secondary | ICD-10-CM | POA: Diagnosis not present

## 2024-09-21 DIAGNOSIS — J45909 Unspecified asthma, uncomplicated: Secondary | ICD-10-CM | POA: Diagnosis not present

## 2024-09-21 DIAGNOSIS — D6869 Other thrombophilia: Secondary | ICD-10-CM | POA: Diagnosis not present

## 2024-09-21 DIAGNOSIS — Z7901 Long term (current) use of anticoagulants: Secondary | ICD-10-CM | POA: Diagnosis not present

## 2024-09-21 DIAGNOSIS — G8929 Other chronic pain: Secondary | ICD-10-CM | POA: Diagnosis not present

## 2024-09-21 DIAGNOSIS — F324 Major depressive disorder, single episode, in partial remission: Secondary | ICD-10-CM | POA: Diagnosis not present

## 2024-09-21 DIAGNOSIS — E669 Obesity, unspecified: Secondary | ICD-10-CM | POA: Diagnosis not present

## 2024-09-21 DIAGNOSIS — F419 Anxiety disorder, unspecified: Secondary | ICD-10-CM | POA: Diagnosis not present

## 2024-09-21 DIAGNOSIS — I4891 Unspecified atrial fibrillation: Secondary | ICD-10-CM | POA: Diagnosis not present

## 2024-09-21 DIAGNOSIS — Z6832 Body mass index (BMI) 32.0-32.9, adult: Secondary | ICD-10-CM | POA: Diagnosis not present

## 2024-09-21 DIAGNOSIS — Z8249 Family history of ischemic heart disease and other diseases of the circulatory system: Secondary | ICD-10-CM | POA: Diagnosis not present

## 2024-09-21 DIAGNOSIS — I2510 Atherosclerotic heart disease of native coronary artery without angina pectoris: Secondary | ICD-10-CM | POA: Diagnosis not present

## 2024-09-22 ENCOUNTER — Other Ambulatory Visit: Payer: Self-pay | Admitting: *Deleted

## 2024-09-22 NOTE — Patient Outreach (Signed)
 Complex Care Management   Visit Note  09/22/2024  Name:  Meredith [name] MRN: [id number] DOB: 1951-03-17  Situation: Referral received for Complex Care Management related to Visit rescheduled to 10/01/24 @ 9:30 am.  Patient has lost her voice and has requested to reschedule appointment.  I obtained verbal consent from Patient.  Visit completed with Patient  on the phone  Background:   Past Medical History:  Diagnosis Date   ANEMIA, IRON DEFICIENCY, UNSPEC. 02/05/2007   Qualifier: Diagnosis of  By: Cleatus MD, Arlyss     ASTHMA, INTERMITTENT 09/07/2010   Qualifier: Diagnosis of  By: Gwenyth MD, [place]     CAD (coronary artery disease), native coronary artery 02/16/2015   Cath with 20% LCx and RCA   Colon polyps    CTS (carpal tunnel syndrome) 06/06/2016   Diabetes mellitus    Diverticulosis 05/17/12   DJD (degenerative joint disease) of lumbar spine    Enteritis    Flu vaccine need 09/19/2022   Gastric ulcer 04/2000   Hyperlipidemia    Hypertension    Irregular heart beat    Obesity    Osteoarthritis    Persistent atrial fibrillation (HCC)    CHADS2VASC score is 5 and on Xarelto    Pruritus 02/21/2021   Rash 02/15/2021   Renal cyst, left 05/17/12   Ventral hernia     Assessment: Patient Reported Symptoms:  Cognitive        Neurological      HEENT        Cardiovascular      Respiratory      Endocrine      Gastrointestinal        Genitourinary      Integumentary      Musculoskeletal          Psychosocial            09/22/2024    PHQ2-9 Depression Screening   Little interest or pleasure in doing things    Feeling down, depressed, or hopeless    PHQ-2 - Total Score    Trouble falling or staying asleep, or sleeping too much    Feeling tired or having little energy    Poor appetite or overeating     Feeling bad about yourself - or that you are a failure or have let yourself or your family down    Trouble concentrating on things, such as reading the newspaper  or watching television    Moving or speaking so slowly that other people could have noticed.  Or the opposite - being so fidgety or restless that you have been moving around a lot more than usual    Thoughts that you would be better off dead, or hurting yourself in some way    PHQ2-9 Total Score    If you checked off any problems, how difficult have these problems made it for you to do your work, take care of things at home, or get along with other people    Depression Interventions/Treatment      There were no vitals filed for this visit.  Medications Reviewed Today   Medications were not reviewed in this encounter     Recommendation:   PCP Follow-up  Follow Up Plan:   Telephone follow-up in 1 week: 10/01/24 @ 9:30 am  Grayson White, RN, BSN, ACM RN Care Manager [employer] [phone]

## 2024-09-24 ENCOUNTER — Other Ambulatory Visit: Payer: Self-pay

## 2024-09-24 ENCOUNTER — Observation Stay (HOSPITAL_COMMUNITY)
Admission: EM | Admit: 2024-09-24 | Discharge: 2024-09-26 | Disposition: A | Discharge status: Home / Self Care | Attending: Family Medicine | Admitting: Family Medicine

## 2024-09-24 ENCOUNTER — Emergency Department (HOSPITAL_COMMUNITY)

## 2024-09-24 ENCOUNTER — Encounter (HOSPITAL_COMMUNITY): Payer: Self-pay

## 2024-09-24 DIAGNOSIS — R2689 Other abnormalities of gait and mobility: Secondary | ICD-10-CM | POA: Insufficient documentation

## 2024-09-24 DIAGNOSIS — E1122 Type 2 diabetes mellitus with diabetic chronic kidney disease: Secondary | ICD-10-CM | POA: Insufficient documentation

## 2024-09-24 DIAGNOSIS — I480 Paroxysmal atrial fibrillation: Secondary | ICD-10-CM | POA: Diagnosis not present

## 2024-09-24 DIAGNOSIS — E785 Hyperlipidemia, unspecified: Secondary | ICD-10-CM | POA: Insufficient documentation

## 2024-09-24 DIAGNOSIS — Z794 Long term (current) use of insulin: Secondary | ICD-10-CM | POA: Insufficient documentation

## 2024-09-24 DIAGNOSIS — R918 Other nonspecific abnormal finding of lung field: Secondary | ICD-10-CM | POA: Diagnosis not present

## 2024-09-24 DIAGNOSIS — E876 Hypokalemia: Principal | ICD-10-CM | POA: Diagnosis present

## 2024-09-24 DIAGNOSIS — R531 Weakness: Secondary | ICD-10-CM | POA: Diagnosis not present

## 2024-09-24 DIAGNOSIS — R110 Nausea: Secondary | ICD-10-CM | POA: Diagnosis not present

## 2024-09-24 DIAGNOSIS — R0602 Shortness of breath: Secondary | ICD-10-CM | POA: Diagnosis not present

## 2024-09-24 DIAGNOSIS — N179 Acute kidney failure, unspecified: Secondary | ICD-10-CM

## 2024-09-24 DIAGNOSIS — Z79899 Other long term (current) drug therapy: Secondary | ICD-10-CM | POA: Diagnosis not present

## 2024-09-24 DIAGNOSIS — N189 Chronic kidney disease, unspecified: Secondary | ICD-10-CM | POA: Insufficient documentation

## 2024-09-24 DIAGNOSIS — I129 Hypertensive chronic kidney disease with stage 1 through stage 4 chronic kidney disease, or unspecified chronic kidney disease: Secondary | ICD-10-CM | POA: Diagnosis not present

## 2024-09-24 DIAGNOSIS — I959 Hypotension, unspecified: Secondary | ICD-10-CM | POA: Diagnosis not present

## 2024-09-24 DIAGNOSIS — R42 Dizziness and giddiness: Secondary | ICD-10-CM

## 2024-09-24 DIAGNOSIS — I4891 Unspecified atrial fibrillation: Principal | ICD-10-CM | POA: Insufficient documentation

## 2024-09-24 DIAGNOSIS — Z789 Other specified health status: Secondary | ICD-10-CM

## 2024-09-24 LAB — COMPREHENSIVE METABOLIC PANEL WITH GFR
ALT: 17 U/L (ref 0–44)
AST: 20 U/L (ref 15–41)
Albumin: 3 g/dL — ABNORMAL LOW (ref 3.5–5.0)
Alkaline Phosphatase: 31 U/L — ABNORMAL LOW (ref 38–126)
Anion gap: 15 (ref 5–15)
BUN: 13 mg/dL (ref 8–23)
CO2: 21 mmol/L — ABNORMAL LOW (ref 22–32)
Calcium: 8.6 mg/dL — ABNORMAL LOW (ref 8.9–10.3)
Chloride: 105 mmol/L (ref 98–111)
Creatinine, Ser: 1.21 mg/dL — ABNORMAL HIGH (ref 0.44–1.00)
GFR, Estimated: 47 mL/min — ABNORMAL LOW (ref >60)
Glucose, Bld: 126 mg/dL — ABNORMAL HIGH (ref 70–99)
Potassium: 2.7 mmol/L — CL (ref 3.5–5.1)
Sodium: 141 mmol/L (ref 135–145)
Total Bilirubin: 1.2 mg/dL (ref 0.0–1.2)
Total Protein: 5.8 g/dL — ABNORMAL LOW (ref 6.5–8.1)

## 2024-09-24 LAB — I-STAT CHEM 8, ED
BUN: 15 mg/dL (ref 8–23)
Calcium, Ion: 1.07 mmol/L — ABNORMAL LOW (ref 1.15–1.40)
Chloride: 105 mmol/L (ref 98–111)
Creatinine, Ser: 1.2 mg/dL — ABNORMAL HIGH (ref 0.44–1.00)
Glucose, Bld: 118 mg/dL — ABNORMAL HIGH (ref 70–99)
HCT: 32 % — ABNORMAL LOW (ref 36.0–46.0)
Hemoglobin: 10.9 g/dL — ABNORMAL LOW (ref 12.0–15.0)
Potassium: 2.7 mmol/L — CL (ref 3.5–5.1)
Sodium: 143 mmol/L (ref 135–145)
TCO2: 20 mmol/L — ABNORMAL LOW (ref 22–32)

## 2024-09-24 LAB — I-STAT VENOUS BLOOD GAS, ED
Acid-base deficit: 2 mmol/L (ref 0.0–2.0)
Bicarbonate: 22.6 mmol/L (ref 20.0–28.0)
Calcium, Ion: 1.09 mmol/L — ABNORMAL LOW (ref 1.15–1.40)
HCT: 33 % — ABNORMAL LOW (ref 36.0–46.0)
Hemoglobin: 11.2 g/dL — ABNORMAL LOW (ref 12.0–15.0)
O2 Saturation: 74 %
Potassium: 2.7 mmol/L — CL (ref 3.5–5.1)
Sodium: 142 mmol/L (ref 135–145)
TCO2: 24 mmol/L (ref 22–32)
pCO2, Ven: 38.1 mmHg — ABNORMAL LOW (ref 44–60)
pH, Ven: 7.38 (ref 7.25–7.43)
pO2, Ven: 40 mmHg (ref 32–45)

## 2024-09-24 LAB — CBC WITH DIFFERENTIAL/PLATELET
Abs Immature Granulocytes: 0.02 K/uL (ref 0.00–0.07)
Basophils Absolute: 0 K/uL (ref 0.0–0.1)
Basophils Relative: 1 %
Eosinophils Absolute: 0.1 K/uL (ref 0.0–0.5)
Eosinophils Relative: 1 %
HCT: 34.7 % — ABNORMAL LOW (ref 36.0–46.0)
Hemoglobin: 11.4 g/dL — ABNORMAL LOW (ref 12.0–15.0)
Immature Granulocytes: 0 %
Lymphocytes Relative: 24 %
Lymphs Abs: 1.5 K/uL (ref 0.7–4.0)
MCH: 25.9 pg — ABNORMAL LOW (ref 26.0–34.0)
MCHC: 32.9 g/dL (ref 30.0–36.0)
MCV: 78.9 fL — ABNORMAL LOW (ref 80.0–100.0)
Monocytes Absolute: 0.4 K/uL (ref 0.1–1.0)
Monocytes Relative: 7 %
Neutro Abs: 4.1 K/uL (ref 1.7–7.7)
Neutrophils Relative %: 67 %
Platelets: 195 K/uL (ref 150–400)
RBC: 4.4 MIL/uL (ref 3.87–5.11)
RDW: 15.9 % — ABNORMAL HIGH (ref 11.5–15.5)
WBC: 6.1 K/uL (ref 4.0–10.5)
nRBC: 0 % (ref 0.0–0.2)

## 2024-09-24 LAB — I-STAT CG4 LACTIC ACID, ED: Lactic Acid, Venous: 1.7 mmol/L (ref 0.5–1.9)

## 2024-09-24 LAB — TROPONIN I (HIGH SENSITIVITY): Troponin I (High Sensitivity): <2 ng/L (ref <18)

## 2024-09-24 MED ORDER — PROPOFOL 10 MG/ML IV BOLUS: 0.5000 mg/kg | Freq: Once | INTRAVENOUS | Status: DC

## 2024-09-24 MED ORDER — ETOMIDATE 2 MG/ML IV SOLN
INTRAVENOUS | Status: AC
  Filled 2024-09-24: qty 10

## 2024-09-24 MED ORDER — ETOMIDATE 2 MG/ML IV SOLN: 10.0000 mg | Freq: Once | INTRAVENOUS | Status: DC

## 2024-09-24 MED ORDER — POTASSIUM CHLORIDE 10 MEQ/100ML IV SOLN
10.0000 meq | INTRAVENOUS | Status: AC
  Administered 2024-09-24 – 2024-09-25 (×4): 10 meq via INTRAVENOUS
  Filled 2024-09-24 (×4): qty 100

## 2024-09-24 NOTE — ED Provider Notes (Incomplete)
 [place] EMERGENCY DEPARTMENT AT [facility] Provider Note   CSN: [id number] Arrival date & time: 09/24/24  2137     Patient presents with: Hypotension   Meredith [name] is a 73 y.o. female.  Patient with past medical history significant for unintentional weight loss, type II DM, persistent atrial fibrillation on Xarelto , CAD, dizziness, CKD presents to the emergency room complaining of nausea and dizziness since last night.  EMS transported the patient and reports that patient was in A-fib RVR with an initial pressure of 74/40.  EMS administered 1200 mL of fluids.  Upon arrival blood pressure is still 74/58.  She was administered Zofran  during transport.  Patient endorses nausea without emesis, denies chest pain, shortness of breath, abdominal pain.  Continues to endorse a lightheaded/dizzy feeling which began last night.  The patient feels that she is overmedicated.  Patient is also taking muscle relaxers and is unsure as to why she is prescribed the muscle relaxers.  {Add pertinent medical, surgical, social history, OB history to HPI:32947} HPI     Prior to Admission medications   Medication Sig Start Date End Date Taking? Authorizing Provider  amLODipine -valsartan  (EXFORGE ) 5-160 MG tablet TAKE 1 TABLET BY MOUTH EVERY DAY 06/01/24   Cleotilde Perkins, DO  Bempedoic Acid-Ezetimibe  (NEXLIZET ) 180-10 MG TABS Take 1 tablet by mouth daily. 02/13/24   Shlomo Wilbert SAUNDERS, MD  Blood Glucose Monitoring Suppl (ACCU-CHEK AVIVA PLUS) w/Device KIT 1 kit by Does not apply route as directed. 09/08/24   Gomes, Adriana, DO  diclofenac  sodium (VOLTAREN ) 1 % GEL Apply 2 g topically 4 (four) times daily. 08/27/19   Benjamine Hamilton, DO  hydrOXYzine  (ATARAX ) 10 MG tablet Take 10 mg by mouth once. 09/01/24   [provider]  INVOKANA  100 MG TABS tablet TAKE 1 TABLET BY MOUTH DAILY BEFORE BREAKFAST. 02/11/24   Cleotilde Perkins, DO  metFORMIN  (GLUCOPHAGE ) 1000 MG tablet Take 1 tablet (1,000 mg total) by mouth  2 (two) times daily with a meal. 09/08/24   Cleotilde Perkins, DO  metoprolol  succinate (TOPROL -XL) 25 MG 24 hr tablet Take 75 mg by mouth daily. Take with or immediately following a meal    [provider]  olopatadine  (PATADAY ) 0.1 % ophthalmic solution Place 1 drop into both eyes 2 (two) times daily. 06/17/24   McDiarmid, Krystal BIRCH, MD  rivaroxaban  (XARELTO ) 20 MG TABS tablet Take 1 tablet (20 mg total) by mouth daily. 09/14/24   Emil Share, DO  rosuvastatin  (CRESTOR ) 40 MG tablet Take 1 tablet (40 mg total) by mouth daily. 02/13/24   Shlomo Wilbert SAUNDERS, MD  spironolactone  (ALDACTONE ) 25 MG tablet Take 25 mg by mouth daily. 08/29/24   [provider]  tiZANidine  (ZANAFLEX ) 4 MG tablet Take 1 tablet (4 mg total) by mouth at bedtime. 08/19/24   Howell Lunger, DO    Allergies: Lisinopril , Doxycycline , Flexeril [cyclobenzaprine hcl], Lipitor [atorvastatin calcium ], and Metaxalone    Review of Systems  Updated Vital Signs BP (!) 78/58 (BP Location: Right Arm)   Pulse (!) 125   Temp 97.6 F (36.4 C)   Resp 15   Ht 5' 3 (1.6 m)   Wt 86.6 kg   SpO2 100%   BMI 33.82 kg/m   Physical Exam Vitals and nursing note reviewed.  Constitutional:      General: She is not in acute distress.    Appearance: She is well-developed.  HENT:     Head: Normocephalic and atraumatic.  Eyes:     Conjunctiva/sclera:  Conjunctivae normal.  Cardiovascular:     Rate and Rhythm: Tachycardia present. Rhythm irregular.  Pulmonary:     Effort: Pulmonary effort is normal. No respiratory distress.     Breath sounds: Normal breath sounds.  Abdominal:     Palpations: Abdomen is soft.     Tenderness: There is no abdominal tenderness.  Musculoskeletal:        General: No swelling.     Cervical back: Neck supple.     Comments: Palpable pedal pulses  Skin:    General: Skin is warm and dry.     Capillary Refill: Capillary refill takes less than 2 seconds.  Neurological:     General: No focal deficit present.      Mental Status: She is alert and oriented to person, place, and time.  Psychiatric:        Mood and Affect: Mood normal.     (all labs ordered are listed, but only abnormal results are displayed) Labs Reviewed  CBC WITH DIFFERENTIAL/PLATELET - Abnormal; Notable for the following components:      Result Value   Hemoglobin 11.4 (*)    HCT 34.7 (*)    MCV 78.9 (*)    MCH 25.9 (*)    RDW 15.9 (*)    All other components within normal limits  I-STAT CHEM 8, ED - Abnormal; Notable for the following components:   Potassium 2.7 (*)    Creatinine, Ser 1.20 (*)    Glucose, Bld 118 (*)    Calcium , Ion 1.07 (*)    TCO2 20 (*)    Hemoglobin 10.9 (*)    HCT 32.0 (*)    All other components within normal limits  I-STAT VENOUS BLOOD GAS, ED - Abnormal; Notable for the following components:   pCO2, Ven 38.1 (*)    Potassium 2.7 (*)    Calcium , Ion 1.09 (*)    HCT 33.0 (*)    Hemoglobin 11.2 (*)    All other components within normal limits  COMPREHENSIVE METABOLIC PANEL WITH GFR  I-STAT CG4 LACTIC ACID, ED  TROPONIN I (HIGH SENSITIVITY)    EKG: None  Radiology: DG Chest Port 1 View Result Date: 09/24/2024 EXAM: 1 VIEW(S) XRAY OF THE CHEST 09/24/2024 10:41:15 PM COMPARISON: Chest x-ray 09/14/2024 and CT c-spine 10/31/2021. CLINICAL HISTORY: Shortness of breath, hypotension. FINDINGS: LUNGS AND PLEURA: Chronic coarsened interstitial markings. No focal pulmonary opacity. No pulmonary edema. No pleural effusion. No pneumothorax. HEART AND MEDIASTINUM: Unchanged cardiomediastinal silhouette. BONES AND SOFT TISSUES: Patient is rotated. No acute osseous abnormality. IMPRESSION: 1. No acute cardiopulmonary process. 2. Chronic coarsened interstitial markings with question underlying emphysema. Electronically signed by: Morgane Naveau MD 09/24/2024 11:10 PM EDT RP Workstation: [id number]    {Document cardiac monitor, telemetry assessment procedure when appropriate:32947} .Critical  Care  Performed by: Logan Ubaldo NOVAK, PA-C Authorized by: Logan Ubaldo NOVAK, PA-C   Critical care provider statement:    Critical care time (minutes):  30   Critical care time was exclusive of:  Separately billable procedures and treating other patients   Critical care was necessary to treat or prevent imminent or life-threatening deterioration of the following conditions:  Cardiac failure (A-fib RVR requiring cardioversion due to profound hypotension)   Critical care was time spent personally by me on the following activities:  Development of treatment plan with patient or surrogate, discussions with consultants, evaluation of patient's response to treatment, examination of patient, ordering and review of laboratory studies, ordering and review of radiographic studies, ordering and  performing treatments and interventions, pulse oximetry, re-evaluation of patient's condition and review of old charts    Medications Ordered in the ED  potassium chloride  10 mEq in 100 mL IVPB (10 mEq Intravenous New Bag/Given 09/24/24 2319)  propofol (DIPRIVAN) 10 mg/mL bolus/IV push 43.3 mg (has no administration in time range)  etomidate  (AMIDATE ) injection 10 mg (has no administration in time range)  etomidate  (AMIDATE ) 2 MG/ML injection (has no administration in time range)      {Click here for ABCD2, HEART and other calculators REFRESH Note before signing:1}                              Medical Decision Making Amount and/or Complexity of Data Reviewed Labs: ordered. Radiology: ordered.  Risk Prescription drug management.   This patient presents to the ED for concern of weakness/dizziness with nausea, this involves an extensive number of treatment options, and is a complaint that carries with it a high risk of complications and morbidity.  The differential diagnosis includes dysrhythmia, infection, electrolyte abnormality, sepsis, intracranial normality, others   Co morbidities / Chronic conditions  that complicate the patient evaluation  As noted in HPI   Additional history obtained:  Additional history obtained from EMR External records from outside source obtained and reviewed including family medicine notes   Lab Tests:  I Ordered, and personally interpreted labs.  The pertinent results include:  ***   Imaging Studies ordered:  I ordered imaging studies including ***  I independently visualized and interpreted imaging which showed *** I agree with the radiologist interpretation   Cardiac Monitoring: / EKG:  The patient was maintained on a cardiac monitor.  I personally viewed and interpreted the cardiac monitored which showed an underlying rhythm of: ***   Problem List / ED Course / Critical interventions / Medication management  *** I ordered medication including ***   Reevaluation of the patient after these medicines showed that the patient *** I have reviewed the patients home medicines and have made adjustments as needed   Consultations Obtained:  I requested consultation with the ***,  and discussed lab and imaging findings as well as pertinent plan - they recommend: ***   Social Determinants of Health:  ***   Test / Admission - Considered:  ***   {Document critical care time when appropriate  Document review of labs and clinical decision tools ie CHADS2VASC2, etc  Document your independent review of radiology images and any outside records  Document your discussion with family members, caretakers and with consultants  Document social determinants of health affecting pt's care  Document your decision making why or why not admission, treatments were needed:32947:::1}   Final diagnoses:  None    ED Discharge Orders     None

## 2024-09-24 NOTE — ED Provider Notes (Signed)
 [place] EMERGENCY DEPARTMENT AT [facility] Provider Note   CSN: [id number] Arrival date & time: 09/24/24  2137     Patient presents with: Hypotension   Meredith [name] is a 73 y.o. female.  Patient with past medical history significant for unintentional weight loss, type II DM, persistent atrial fibrillation on Xarelto , CAD, dizziness, CKD presents to the emergency room complaining of nausea and dizziness since last night.  EMS transported the patient and reports that patient was in A-fib RVR with an initial pressure of 74/40.  EMS administered 1200 mL of fluids.  Upon arrival blood pressure is still 74/58.  She was administered Zofran  during transport.  Patient endorses nausea without emesis, denies chest pain, shortness of breath, abdominal pain.  Continues to endorse a lightheaded/dizzy feeling which began last night.  The patient feels that she is overmedicated.  Patient is also taking muscle relaxers and is unsure as to why she is prescribed the muscle relaxers.   HPI     Prior to Admission medications   Medication Sig Start Date End Date Taking? Authorizing Provider  amLODipine -valsartan  (EXFORGE ) 5-160 MG tablet TAKE 1 TABLET BY MOUTH EVERY DAY 06/01/24   Cleotilde Perkins, DO  Bempedoic Acid-Ezetimibe  (NEXLIZET ) 180-10 MG TABS Take 1 tablet by mouth daily. 02/13/24   Shlomo Wilbert SAUNDERS, MD  Blood Glucose Monitoring Suppl (ACCU-CHEK AVIVA PLUS) w/Device KIT 1 kit by Does not apply route as directed. 09/08/24   Gomes, Adriana, DO  diclofenac  sodium (VOLTAREN ) 1 % GEL Apply 2 g topically 4 (four) times daily. 08/27/19   Benjamine Hamilton, DO  hydrOXYzine  (ATARAX ) 10 MG tablet Take 10 mg by mouth once. 09/01/24   [provider]  INVOKANA  100 MG TABS tablet TAKE 1 TABLET BY MOUTH DAILY BEFORE BREAKFAST. 02/11/24   Cleotilde Perkins, DO  metFORMIN  (GLUCOPHAGE ) 1000 MG tablet Take 1 tablet (1,000 mg total) by mouth 2 (two) times daily with a meal. 09/08/24   Cleotilde Perkins, DO  metoprolol   succinate (TOPROL -XL) 25 MG 24 hr tablet Take 75 mg by mouth daily. Take with or immediately following a meal    [provider]  olopatadine  (PATADAY ) 0.1 % ophthalmic solution Place 1 drop into both eyes 2 (two) times daily. 06/17/24   McDiarmid, Krystal BIRCH, MD  rivaroxaban  (XARELTO ) 20 MG TABS tablet Take 1 tablet (20 mg total) by mouth daily. 09/14/24   Emil Share, DO  rosuvastatin  (CRESTOR ) 40 MG tablet Take 1 tablet (40 mg total) by mouth daily. 02/13/24   Shlomo Wilbert SAUNDERS, MD  spironolactone  (ALDACTONE ) 25 MG tablet Take 25 mg by mouth daily. 08/29/24   [provider]  tiZANidine  (ZANAFLEX ) 4 MG tablet Take 1 tablet (4 mg total) by mouth at bedtime. 08/19/24   Howell Lunger, DO    Allergies: Lisinopril , Doxycycline , Flexeril [cyclobenzaprine hcl], Lipitor [atorvastatin calcium ], and Metaxalone    Review of Systems  Updated Vital Signs BP (!) 94/55   Pulse 71   Temp (!) 97.3 F (36.3 C)   Resp 16   Ht 5' 3 (1.6 m)   Wt 86.6 kg   SpO2 100%   BMI 33.82 kg/m   Physical Exam Vitals and nursing note reviewed.  Constitutional:      General: She is not in acute distress.    Appearance: She is well-developed.  HENT:     Head: Normocephalic and atraumatic.  Eyes:     Conjunctiva/sclera: Conjunctivae normal.  Cardiovascular:     Rate and Rhythm: Tachycardia present.  Rhythm irregular.  Pulmonary:     Effort: Pulmonary effort is normal. No respiratory distress.     Breath sounds: Normal breath sounds.  Abdominal:     Palpations: Abdomen is soft.     Tenderness: There is no abdominal tenderness.  Musculoskeletal:        General: No swelling.     Cervical back: Neck supple.     Comments: Palpable pedal pulses  Skin:    General: Skin is warm and dry.     Capillary Refill: Capillary refill takes less than 2 seconds.  Neurological:     General: No focal deficit present.     Mental Status: She is alert and oriented to person, place, and time.  Psychiatric:         Mood and Affect: Mood normal.     (all labs ordered are listed, but only abnormal results are displayed) Labs Reviewed  COMPREHENSIVE METABOLIC PANEL WITH GFR - Abnormal; Notable for the following components:      Result Value   Potassium 2.7 (*)    CO2 21 (*)    Glucose, Bld 126 (*)    Creatinine, Ser 1.21 (*)    Calcium  8.6 (*)    Total Protein 5.8 (*)    Albumin 3.0 (*)    Alkaline Phosphatase 31 (*)    GFR, Estimated 47 (*)    All other components within normal limits  CBC WITH DIFFERENTIAL/PLATELET - Abnormal; Notable for the following components:   Hemoglobin 11.4 (*)    HCT 34.7 (*)    MCV 78.9 (*)    MCH 25.9 (*)    RDW 15.9 (*)    All other components within normal limits  MAGNESIUM  - Abnormal; Notable for the following components:   Magnesium  1.3 (*)    All other components within normal limits  I-STAT CHEM 8, ED - Abnormal; Notable for the following components:   Potassium 2.7 (*)    Creatinine, Ser 1.20 (*)    Glucose, Bld 118 (*)    Calcium , Ion 1.07 (*)    TCO2 20 (*)    Hemoglobin 10.9 (*)    HCT 32.0 (*)    All other components within normal limits  I-STAT VENOUS BLOOD GAS, ED - Abnormal; Notable for the following components:   pCO2, Ven 38.1 (*)    Potassium 2.7 (*)    Calcium , Ion 1.09 (*)    HCT 33.0 (*)    Hemoglobin 11.2 (*)    All other components within normal limits  I-STAT CG4 LACTIC ACID, ED  I-STAT CG4 LACTIC ACID, ED  TROPONIN I (HIGH SENSITIVITY)  TROPONIN I (HIGH SENSITIVITY)    EKG: None  Radiology: DG Chest Port 1 View Result Date: 09/24/2024 EXAM: 1 VIEW(S) XRAY OF THE CHEST 09/24/2024 10:41:15 PM COMPARISON: Chest x-ray 09/14/2024 and CT c-spine 10/31/2021. CLINICAL HISTORY: Shortness of breath, hypotension. FINDINGS: LUNGS AND PLEURA: Chronic coarsened interstitial markings. No focal pulmonary opacity. No pulmonary edema. No pleural effusion. No pneumothorax. HEART AND MEDIASTINUM: Unchanged cardiomediastinal silhouette. BONES  AND SOFT TISSUES: Patient is rotated. No acute osseous abnormality. IMPRESSION: 1. No acute cardiopulmonary process. 2. Chronic coarsened interstitial markings with question underlying emphysema. Electronically signed by: Morgane Naveau MD 09/24/2024 11:10 PM EDT RP Workstation: [id number]     .Critical Care  Performed by: Logan Ubaldo NOVAK, PA-C Authorized by: Logan Ubaldo NOVAK DEVONNA   Critical care provider statement:    Critical care time (minutes):  80   Critical care time was  exclusive of:  Separately billable procedures and treating other patients   Critical care was necessary to treat or prevent imminent or life-threatening deterioration of the following conditions:  Cardiac failure (A-fib RVR requiring cardioversion due to profound hypotension)   Critical care was time spent personally by me on the following activities:  Development of treatment plan with patient or surrogate, discussions with consultants, evaluation of patient's response to treatment, examination of patient, ordering and review of laboratory studies, ordering and review of radiographic studies, ordering and performing treatments and interventions, pulse oximetry, re-evaluation of patient's condition and review of old charts .Cardioversion  Date/Time: 09/25/2024 12:56 AM  Performed by: Logan Ubaldo NOVAK, PA-C Authorized by: Logan Ubaldo NOVAK DEVONNA   Consent:    Consent obtained:  Verbal and written   Consent given by:  Patient Pre-procedure details:    Cardioversion basis:  Emergent   Rhythm:  Atrial fibrillation   Electrode placement:  Anterior-posterior Patient sedated: Yes. Refer to sedation procedure documentation for details of sedation.  Attempt one:    Cardioversion mode:  Synchronous   Waveform:  Biphasic   Shock (Joules):  150   Shock outcome:  Conversion to normal sinus rhythm Post-procedure details:    Patient status:  Awake   Patient tolerance of procedure:  Tolerated well, no immediate  complications    Medications Ordered in the ED  potassium chloride  10 mEq in 100 mL IVPB (10 mEq Intravenous New Bag/Given 09/25/24 0201)  propofol (DIPRIVAN) 10 mg/mL bolus/IV push 43.3 mg (43.3 mg Intravenous Not Given 09/25/24 0145)  etomidate  (AMIDATE ) injection 10 mg (has no administration in time range)  etomidate  (AMIDATE ) 2 MG/ML injection (has no administration in time range)  etomidate  (AMIDATE ) injection (10 mg Intravenous Given 09/24/24 2347)                                    Medical Decision Making Amount and/or Complexity of Data Reviewed Labs: ordered. Radiology: ordered.  Risk Prescription drug management. Decision regarding hospitalization.   This patient presents to the ED for concern of weakness/dizziness with nausea, this involves an extensive number of treatment options, and is a complaint that carries with it a high risk of complications and morbidity.  The differential diagnosis includes dysrhythmia, infection, electrolyte abnormality, sepsis, intracranial normality, others   Co morbidities / Chronic conditions that complicate the patient evaluation  As noted in HPI   Additional history obtained:  Additional history obtained from EMR External records from outside source obtained and reviewed including family medicine notes   Lab Tests:  I Ordered, and personally interpreted labs.  The pertinent results include: Potassium 2.7, negative troponins, no lactic acidosis   Imaging Studies ordered:  I ordered imaging studies including chest x-ray I independently visualized and interpreted imaging which showed  1. No acute cardiopulmonary process.  2. Chronic coarsened interstitial markings with question underlying emphysema.   I agree with the radiologist interpretation   Cardiac Monitoring: / EKG:  The patient was maintained on a cardiac monitor.  I personally viewed and interpreted the cardiac monitored which showed an underlying rhythm of:  A-fib RVR repeat shows a sinus rhythm after cardioversion   Problem List / ED Course / Critical interventions / Medication management   I ordered medication including potassium, saline bolus.  Etomidate  was ordered for sedation Reevaluation of the patient after these medicines showed that the patient improved I have reviewed the patients  home medicines and have made adjustments as needed   Consultations Obtained:  I requested consultation with the family medicine service,  and discussed lab and imaging findings as well as pertinent plan - they recommend: Admission   Social Determinants of Health:  Patient is a former smoker   Test / Admission - Considered:  Patient with presentation initially consistent with symptomatic atrial fibrillation with significant hypotension.  Cardioversion was performed and hypotension improved.  Heart rate also improved.  Patient is feeling somewhat better at this time.  Her most recent MAP was above 65.  She continues to have significantly low potassium.  At this time I feel she would benefit from observation for continued potassium repletion and observation.      Final diagnoses:  Atrial fibrillation with RVR [facility])  Hypokalemia  Dizziness    ED Discharge Orders     None          Logan Ubaldo KATHEE DEVONNA 09/25/24 9766    Carita Senior, MD 09/25/24 [phone]

## 2024-09-24 NOTE — ED Notes (Signed)
 Called and placed PT on monitor with CCMD

## 2024-09-24 NOTE — ED Triage Notes (Signed)
 PT BIB [facility] from home after becoming weak, nauseated and experiencing dizziness since taking night meds at 7pm. PT also reports recen weight loss of 30-40 pds in the last month. PT was hypotensive at 74/40 then given 1200 of fluid from EMS , BP at 74/58 now. PT has afib baseline and is running 90-100. PT given 4mg  of zofran .

## 2024-09-25 DIAGNOSIS — I4891 Unspecified atrial fibrillation: Secondary | ICD-10-CM | POA: Insufficient documentation

## 2024-09-25 DIAGNOSIS — E876 Hypokalemia: Secondary | ICD-10-CM | POA: Diagnosis not present

## 2024-09-25 DIAGNOSIS — N179 Acute kidney failure, unspecified: Secondary | ICD-10-CM

## 2024-09-25 DIAGNOSIS — I959 Hypotension, unspecified: Secondary | ICD-10-CM | POA: Insufficient documentation

## 2024-09-25 DIAGNOSIS — Z789 Other specified health status: Secondary | ICD-10-CM

## 2024-09-25 LAB — GLUCOSE, CAPILLARY
Glucose-Capillary: 100 mg/dL — ABNORMAL HIGH (ref 70–99)
Glucose-Capillary: 100 mg/dL — ABNORMAL HIGH (ref 70–99)

## 2024-09-25 LAB — BASIC METABOLIC PANEL WITH GFR
Anion gap: 12 (ref 5–15)
Anion gap: 11 (ref 5–15)
BUN: 10 mg/dL (ref 8–23)
BUN: 10 mg/dL (ref 8–23)
CO2: 24 mmol/L (ref 22–32)
CO2: 23 mmol/L (ref 22–32)
Calcium: 8.7 mg/dL — ABNORMAL LOW (ref 8.9–10.3)
Calcium: 8.9 mg/dL (ref 8.9–10.3)
Chloride: 106 mmol/L (ref 98–111)
Chloride: 108 mmol/L (ref 98–111)
Creatinine, Ser: 0.98 mg/dL (ref 0.44–1.00)
Creatinine, Ser: 1.01 mg/dL — ABNORMAL HIGH (ref 0.44–1.00)
GFR, Estimated: >60 mL/min (ref >60)
GFR, Estimated: 58 mL/min — ABNORMAL LOW (ref >60)
Glucose, Bld: 115 mg/dL — ABNORMAL HIGH (ref 70–99)
Glucose, Bld: 113 mg/dL — ABNORMAL HIGH (ref 70–99)
Potassium: 3 mmol/L — ABNORMAL LOW (ref 3.5–5.1)
Potassium: 3.7 mmol/L (ref 3.5–5.1)
Sodium: 142 mmol/L (ref 135–145)
Sodium: 142 mmol/L (ref 135–145)

## 2024-09-25 LAB — TSH: TSH: 1.276 u[IU]/mL (ref 0.350–4.500)

## 2024-09-25 LAB — MAGNESIUM
Magnesium: 1.3 mg/dL — ABNORMAL LOW (ref 1.7–2.4)
Magnesium: 1.5 mg/dL — ABNORMAL LOW (ref 1.7–2.4)
Magnesium: 1.8 mg/dL (ref 1.7–2.4)

## 2024-09-25 LAB — HEMOGLOBIN A1C
Hgb A1c MFr Bld: 6 % — ABNORMAL HIGH (ref 4.8–5.6)
Mean Plasma Glucose: 125.5 mg/dL

## 2024-09-25 LAB — TROPONIN I (HIGH SENSITIVITY): Troponin I (High Sensitivity): <2 ng/L (ref <18)

## 2024-09-25 LAB — CBG MONITORING, ED: Glucose-Capillary: 82 mg/dL (ref 70–99)

## 2024-09-25 MED ORDER — ETOMIDATE 2 MG/ML IV SOLN
INTRAVENOUS | Status: AC | PRN
  Administered 2024-09-24: 10 mg via INTRAVENOUS

## 2024-09-25 MED ORDER — RIVAROXABAN 10 MG PO TABS
20.0000 mg | ORAL_TABLET | Freq: Every evening | ORAL | Status: DC
  Administered 2024-09-25: 20 mg via ORAL
  Filled 2024-09-25: qty 2

## 2024-09-25 MED ORDER — MAGNESIUM SULFATE 2 GM/50ML IV SOLN
2.0000 g | Freq: Once | INTRAVENOUS | Status: AC
  Administered 2024-09-25: 2 g via INTRAVENOUS
  Filled 2024-09-25: qty 50

## 2024-09-25 MED ORDER — POTASSIUM CHLORIDE CRYS ER 20 MEQ PO TBCR
40.0000 meq | EXTENDED_RELEASE_TABLET | Freq: Four times a day (QID) | ORAL | Status: AC
  Administered 2024-09-25 (×2): 40 meq via ORAL
  Filled 2024-09-25 (×2): qty 2

## 2024-09-25 MED ORDER — METOPROLOL SUCCINATE ER 50 MG PO TB24
75.0000 mg | ORAL_TABLET | Freq: Every day | ORAL | Status: DC
  Administered 2024-09-25 – 2024-09-26 (×2): 75 mg via ORAL
  Filled 2024-09-25 (×2): qty 1

## 2024-09-25 MED ORDER — INSULIN ASPART 100 UNIT/ML IJ SOLN
0.0000 [IU] | Freq: Three times a day (TID) | INTRAMUSCULAR | Status: DC
  Administered 2024-09-26: 2 [IU] via SUBCUTANEOUS

## 2024-09-25 MED ORDER — POTASSIUM CHLORIDE 10 MEQ/100ML IV SOLN
10.0000 meq | INTRAVENOUS | Status: AC
  Administered 2024-09-25 (×4): 10 meq via INTRAVENOUS
  Filled 2024-09-25 (×4): qty 100

## 2024-09-25 MED ORDER — POTASSIUM CHLORIDE CRYS ER 20 MEQ PO TBCR
40.0000 meq | EXTENDED_RELEASE_TABLET | Freq: Once | ORAL | Status: AC
  Administered 2024-09-25: 40 meq via ORAL
  Filled 2024-09-25: qty 2

## 2024-09-25 MED ADMIN — Potassium Chloride Powder Packet 20 mEq: 40 meq | ORAL | NDC 00245036089

## 2024-09-25 MED FILL — Potassium Chloride Powder Packet 20 mEq: 40.0000 meq | ORAL | Qty: 2 | Status: AC

## 2024-09-25 NOTE — Assessment & Plan Note (Addendum)
 HLD  - continue bempedoic acid-ezetimibe  180-10 mg daily, Crestor  40 mg daily T2DM  - hold home metformin  1000 mg twice daily and Invokana  100 mg daily, - ACHS sensitive sliding scale insulin  with last A1c 6.1 6 months ago, - will recheck A1c  - consider stopping diabetic medication as patient's A1c has historically been well controlled and she does not like taking medications  Restless legs  - hold tizanidine  4 mg nightly, patient would like to have this Dc'd at discharge

## 2024-09-25 NOTE — ED Provider Notes (Signed)
.  Sedation  Date/Time: 09/25/2024 12:00 AM  Performed by: Carita Senior, MD Authorized by: Carita Senior, MD   Consent:    Consent obtained:  Written   Consent given by:  Patient   Risks discussed:  Allergic reaction, dysrhythmia, nausea, vomiting, respiratory compromise necessitating ventilatory assistance and intubation, prolonged hypoxia resulting in organ damage and inadequate sedation   Alternatives discussed:  Analgesia without sedation Universal protocol:    Procedure explained and questions answered to patient or proxy's satisfaction: yes     Relevant documents present and verified: yes     Test results available: yes     Imaging studies available: yes     Immediately prior to procedure, a time out was called: yes     Patient identity confirmed:  Provided demographic data Indications:    Procedure performed:  Cardioversion   Procedure necessitating sedation performed by:  Physician performing sedation Pre-sedation assessment:    Time since last food or drink:  6   NPO status caution: unable to specify NPO status     ASA classification: class 2 - patient with mild systemic disease     Mouth opening:  3 or more finger widths   Thyromental distance:  4 finger widths   Mallampati score:  I - soft palate, uvula, fauces, pillars visible   Neck mobility: normal     Pre-sedation assessments completed and reviewed: airway patency, cardiovascular function, hydration status, mental status, nausea/vomiting, pain level, respiratory function and temperature   A pre-sedation assessment was completed prior to the start of the procedure Immediate pre-procedure details:    Reassessment: Patient reassessed immediately prior to procedure     Reviewed: vital signs, relevant labs/tests and NPO status     Verified: bag valve mask available, emergency equipment available, intubation equipment available, IV patency confirmed, oxygen available and reversal medications available   Procedure  details (see MAR for exact dosages):    Preoxygenation:  Nasal cannula   Sedation:  Etomidate    Intended level of sedation: moderate (conscious sedation)   Intra-procedure monitoring:  Blood pressure monitoring, cardiac monitor, continuous pulse oximetry, continuous capnometry, frequent LOC assessments and frequent vital sign checks   Intra-procedure events: none     Intra-procedure management:  Airway repositioning   Total Provider sedation time (minutes):  15 Post-procedure details:   A post-sedation assessment was completed following the completion of the procedure.   Attendance: Constant attendance by certified staff until patient recovered     Recovery: Patient returned to pre-procedure baseline     Post-sedation assessments completed and reviewed: airway patency, cardiovascular function, hydration status, mental status, nausea/vomiting, pain level, respiratory function and temperature     Patient is stable for discharge or admission: yes     Procedure completion:  Tolerated well, no immediate complications     Carita Senior, MD 09/25/24 0002

## 2024-09-25 NOTE — Assessment & Plan Note (Addendum)
 Initial K2.7, mag 1.3. -Admit to FM TS, [facility], observation on med/tele -Repletion in progress, total of 80 mill equivalents potassium ordered and 2 g mag ordered - May need chronic repletion at time of discharge. Looks like pt has had similar low potassium at recent 9/23 and 10/7 ED visits which improved with repletion.  -Will recheck BMP and Mg at noon

## 2024-09-25 NOTE — Plan of Care (Signed)
  Problem: Education: Goal: Ability to describe self-care measures that may prevent or decrease complications (Diabetes Survival Skills Education) will improve Outcome: Progressing   Problem: Health Behavior/Discharge Planning: Goal: Ability to identify and utilize available resources and services will improve Outcome: Progressing   Problem: Nutritional: Goal: Maintenance of adequate nutrition will improve Outcome: Progressing

## 2024-09-25 NOTE — Assessment & Plan Note (Signed)
 Baseline Cr <1. Most likely prerenal in the setting of poor PO intake  - Hold spironolactone  25 mg daily - daily BMP

## 2024-09-25 NOTE — Assessment & Plan Note (Signed)
 Pressures continue to be soft most likely 2/2 to volume depletion  - Hold amlodipine -valsartan  5-160 mg - encourage PO intake - hold fluids until echo results

## 2024-09-25 NOTE — Progress Notes (Signed)
 RT assisted in conscious sedation.

## 2024-09-25 NOTE — Hospital Course (Addendum)
 This is a 73 year old female with a past medical history of HLD, HTN, T2DM, restless legs, CKD, A-fib who was admitted to the family medicine teaching service for hypokalemia after successful cardioversion for atrial fibrillation with RVR.  Her hospital course is detailed below:  Hypokalemia This is a chronic problem for which she was previously on daily supplementation.  Potassium was 2.7 in the ED.  40 mill equivalents IV ordered in the ED.  40 mill equivalents p.o. ordered after.  Magnesium  notably low at 1.3.  2 g repleted.  Subsequent BMP revealed electrolytes within normal limits.***  A-fib with RVR Patient presented to the ED tachycardic and hypotensive, found to be in A-fib with RVR.  2 L IV fluids was administered. Cardioversion in the ED was successful, and patient's initial dizziness and chest pain resolved.  MAP improved and EKG revealing normal sinus rhythm.  Patient reported that she feels her medications are causing her medical problems.  PCP recommendations: Counseling regarding medication uses and adherence as patient feels she is on too many medications and her medications are causing her problems Education on the patient's cardiology team and cardiology medication recommendations for prevention of RVR

## 2024-09-25 NOTE — Assessment & Plan Note (Addendum)
 Now resolved s/p cardioversion in ED. NSR on telemetry and BP now wnl. Euvolemic on exam. Saw Cardiology outpt recently for similar afib RVR symptoms, was recommended to continue metop and xarelto .  - 12 hours of telemetry - Continue Xarelto  20 mg daily - Continue metoprolol  succinate 25 mg daily - Repeat EKG - Echo - Needs outpt f/u w/ Cardiology

## 2024-09-25 NOTE — Assessment & Plan Note (Addendum)
 HLD -continue bempedoic acid-ezetimibe  180-10 mg daily, Crestor  40 mg daily HTN -Will hold amlodipine -valsartan  5-160 mg, indapamide  daily given presentation with hypotension T2DM -hold home metformin  1000 mg twice daily and Invokana  100 mg daily, ACHS moderate sliding scale insulin , will order A1c Depression/Anxiety -continue Atarax  10 mg daily Restless legs -continue tizanidine  4 mg nightly CKD -hold spironolactone  25 mg daily

## 2024-09-25 NOTE — Assessment & Plan Note (Signed)
 S/P cardioversion 10/18 now NSR  - Echo pending  - Xarelto  20 mg daily  - Metoprolol  succinate 25 mg daily

## 2024-09-25 NOTE — Assessment & Plan Note (Signed)
 S/P repletion - Recheck BMP and Mg at 10 AM  - Goal Mag >2, K >4

## 2024-09-25 NOTE — Care Management Obs Status (Signed)
 MEDICARE OBSERVATION STATUS NOTIFICATION   Patient Details  Name: Meredith [name] MRN: [id number] Date of Birth: 05/04/1951   Medicare Observation Status Notification Given:  Yes    Corean JAYSON Canary, RN 09/25/2024, 3:19 PM

## 2024-09-25 NOTE — H&P (Addendum)
 Hospital Admission History and Physical Service Pager: [phone]  Patient name: Meredith [name] Medical record number: [id number] Date of Birth: February 17, 1951 Age: 73 y.o. Gender: female  Primary Care Provider: Cleotilde Perkins, DO Consultants: none Code Status: Full Preferred Emergency Contact: Darin Raynald Simpler [phone] Contact Information     Name Relation Home Work Mobile   Smith,Rochelle Daughter [phone]  [phone]      Other Contacts     Name Relation Home Work Mobile   [place] Sister [phone]     Rudd,[place]  Sister [phone]         Chief Complaint: nausea, chest pain, lightheadedness, feeling scared  Assessment and Plan: Meredith [name] is a 73 y.o. female presenting with hypokalemia and afib RVR. Differential for hypokalemia includes poor PO, medication side effects, GI loss. Poor PO is most likely given concurrent workup for decreased appetite and unintentional weight loss. From outpt notes, it appears there is concern that pt is having difficulty taking care of herself at home and has been given a social work referral. Medication side effects such as not taking her spironolactone  or over-taking ex-forge. Less likely GI loss as pt does not report recent vomiting or diarrhea.  Assessment & Plan Hypokalemia Hypomagnesemia Initial K2.7, mag 1.3. -Admit to FM TS, [facility], observation on med/tele -Repletion in progress, total of 80 mill equivalents potassium ordered and 2 g mag ordered - May need chronic repletion at time of discharge. Looks like pt has had similar low potassium at recent 9/23 and 10/7 ED visits which improved with repletion.  -Will recheck BMP and Mg at noon Atrial fibrillation with RVR (HCC) (Resolved: 09/25/2024) Now resolved s/p cardioversion in ED. NSR on telemetry and BP now wnl. Euvolemic on exam. Saw Cardiology outpt recently for similar afib RVR symptoms, was recommended to continue metop and xarelto .  - 12 hours  of telemetry - Continue Xarelto  20 mg daily - Continue metoprolol  succinate 25 mg daily - Repeat EKG - Echo - Needs outpt f/u w/ Cardiology Chronic health problem HLD -continue bempedoic acid-ezetimibe  180-10 mg daily, Crestor  40 mg daily HTN -Will hold amlodipine -valsartan  5-160 mg, indapamide  daily given presentation with hypotension T2DM -hold home metformin  1000 mg twice daily and Invokana  100 mg daily, ACHS moderate sliding scale insulin , will order A1c Depression/Anxiety -continue Atarax  10 mg daily Restless legs -continue tizanidine  4 mg nightly CKD -hold spironolactone  25 mg daily  FEN/GI: Regular VTE Prophylaxis: xarelto   Disposition: med-tele  History of Present Illness:  Meredith [name] is a 73 y.o. female presenting with nausea, chest pain, and lightheadedness.   Yesterday, she started feeling nauseous, chest pain, lightheaded. No vomiting. She says she is taking too much medication, and she feels she does not need all of those medications. She does not use oxygen at home. Currently no chest pain.   She was seen in the ED on 08/31/2024 for the same presentation and followed up with cardiology on 09/03/24.  At that time her cardiologist did not have any additional recommendations for preventing recurrence of A-fib with RVR.  In the ED, she was noted to be significantly hypotensive with SBP 69, status post 2.2 L IV fluid.  A-fib with RVR and cardioverted excessively in the ED with heart rate now in the 80s.  Maps 71-72 now status post cardioversion and fluid resuscitation.  Hypokalemia initially 2.7, repletion protocol ordered and ongoing.  Notably dizziness is now resolved after ED interventions.  EKG now with sinus rhythm  Review Of Systems: Per  HPI with the following additions: no vomiting, no diarrhea, does endorse unintentional weight loss due to no appetite  Pertinent Past Medical History: HLD HTN Persistent A-fib T2DM Depression GERD CAD CKD  Remainder reviewed  in history tab.   Pertinent Past Surgical History: Remainder reviewed in history tab.   Pertinent Social History: Tobacco use: No Alcohol use: No Other Substance use: No Lives alone, has support from her son, sister, and daughter on occasion Manages her own medications,  Sister assists with grocery shopping and finances Patient does not drive, sisters provide transport  Pertinent Family History: Remainder reviewed in history tab.   Important Outpatient Medications: Reports that she is taking all of her medications; however, she is only able to remember metformin , Xarelto , Crestor , tizanidine , metoprolol , and 1 other heart medication  Amlodipine -valsartan  5-160 mg daily Bempedoic acid-ezetimibe  180-10 mg daily Atarax  10 mg daily Invokana  100 mg daily Metformin  1000 mg twice daily Xarelto  20 mg daily Crestor  40 mg daily Spironolactone  25 mg daily Tizanidine  4 mg nightly Metoprolol  succinate 25 mg daily Indapamide  1.25 mg daily  Remainder reviewed in medication history.   Objective: BP (!) 94/55   Pulse 71   Temp (!) 97.3 F (36.3 C)   Resp 16   Ht 5' 3 (1.6 m)   Wt 86.6 kg   SpO2 100%   BMI 33.82 kg/m  Exam: General: Alert, non-toxic appearing woman speaking comfortably on RA. NAD. HEENT: NCAT. MMM. CV: RRR, no murmurs. Cap refill <2. Resp: CTAB, no wheezing or crackles. Normal WOB on RA.  Abm: Soft, nontender, nondistended. BS present. Ext: Moves all ext spontaneously. Trace edema in BL LE.  Skin: Warm, well perfused   Labs:  CBC BMET  Recent Labs  Lab 09/24/24 2228 09/24/24 2240  WBC 6.1  --   HGB 11.4* 11.2*  10.9*  HCT 34.7* 33.0*  32.0*  PLT 195  --    Recent Labs  Lab 09/24/24 2228 09/24/24 2240  NA 141 142  143  K 2.7* 2.7*  2.7*  CL 105 105  CO2 21*  --   BUN 13 15  CREATININE 1.21* 1.20*  GLUCOSE 126* 118*  CALCIUM  8.6*  --     Mag 1.3 Troponin less than 2 x 2 Lactic acid 1.7  EKG: Normal sinus rhythm per telemetry  review   Imaging Studies Performed:  DG Chest Port 1 View Result Date: 09/24/2024 EXAM: 1 VIEW(S) XRAY OF THE CHEST 09/24/2024 10:41:15 PM COMPARISON: Chest x-ray 09/14/2024 and CT c-spine 10/31/2021. CLINICAL HISTORY: Shortness of breath, hypotension. FINDINGS: LUNGS AND PLEURA: Chronic coarsened interstitial markings. No focal pulmonary opacity. No pulmonary edema. No pleural effusion. No pneumothorax. HEART AND MEDIASTINUM: Unchanged cardiomediastinal silhouette. BONES AND SOFT TISSUES: Patient is rotated. No acute osseous abnormality. IMPRESSION: 1. No acute cardiopulmonary process. 2. Chronic coarsened interstitial markings with question underlying emphysema. Electronically signed by: Morgane Naveau MD 09/24/2024 11:10 PM EDT RP Workstation: [id number]    Alena Morrison, Elio, MD 09/25/2024, 2:25 AM PGY-1, [facility] Family Medicine  FPTS Intern pager: [phone], text pages welcome Secure chat group [facility] Teaching Service

## 2024-09-25 NOTE — Evaluation (Signed)
 Physical Therapy Evaluation/ discharge Patient Details Name: Meredith [name] MRN: [id number] DOB: Oct 21, 1951 Today's Date: 09/25/2024  History of Present Illness  73 yo F adm 09/24/24 with Nausea, chest pain, hypokalemia, hypomagnesemia, Afib with RVR s/p DCCV in ED. PMhx: T2DM, HLD, obesity, depression, HTN, DDD, Afib, CAD, RLS, CKD  Clinical Impression  Pt pleasant, independent and driving at baseline. Pt with VSS throughout session with HR 70-78 and SPO2 100% on RA. Pt mobilizing with AD and no significant balance or gait deficits. Pt at baseline function without further needs at this time, will sign off.         If plan is discharge home, recommend the following:     Can travel by private vehicle        Equipment Recommendations None recommended by PT  Recommendations for Other Services       Functional Status Assessment Patient has not had a recent decline in their functional status     Precautions / Restrictions Precautions Precautions: None      Mobility  Bed Mobility Overal bed mobility: Modified Independent                  Transfers Overall transfer level: Independent                      Ambulation/Gait Ambulation/Gait assistance: Modified independent (Device/Increase time) Gait Distance (Feet): 300 Feet Assistive device: None Gait Pattern/deviations: Step-through pattern, Decreased stride length   Gait velocity interpretation: 1.31 - 2.62 ft/sec, indicative of limited community ambulator   General Gait Details: pt with slow, steady gait without use of DME  Stairs            Wheelchair Mobility     Tilt Bed    Modified Rankin (Stroke Patients Only)       Balance Overall balance assessment: Mild deficits observed, not formally tested                                           Pertinent Vitals/Pain Pain Assessment Pain Assessment: No/denies pain    Home Living Family/patient expects to be discharged  to:: Private residence Living Arrangements: Children Available Help at Discharge: Family;Available PRN/intermittently Type of Home: House Home Access: Stairs to enter   [employer] of Steps: 4   Home Layout: One level Home Equipment: Cane - single point      Prior Function Prior Level of Function : Independent/Modified Independent;Driving             Mobility Comments: walks independently, cane at times ADLs Comments: retired from [employer] a few months ago, looking for another job     Extremity/Trunk Assessment   Upper Extremity Assessment Upper Extremity Assessment: Overall WFL for tasks assessed    Lower Extremity Assessment Lower Extremity Assessment: Overall WFL for tasks assessed    Cervical / Trunk Assessment Cervical / Trunk Assessment: Normal  Communication   Communication Communication: No apparent difficulties    Cognition Arousal: Alert Behavior During Therapy: WFL for tasks assessed/performed   PT - Cognitive impairments: No apparent impairments                         Following commands: Intact       Cueing Cueing Techniques: Verbal cues     General Comments      Exercises  Assessment/Plan    PT Assessment Patient does not need any further PT services  PT Problem List         PT Treatment Interventions      PT Goals (Current goals can be found in the Care Plan section)  Acute Rehab PT Goals PT Goal Formulation: All assessment and education complete, DC therapy    Frequency       Co-evaluation               AM-PAC PT 6 Clicks Mobility  Outcome Measure Help needed turning from your back to your side while in a flat bed without using bedrails?: None Help needed moving from lying on your back to sitting on the side of a flat bed without using bedrails?: None Help needed moving to and from a bed to a chair (including a wheelchair)?: None Help needed standing up from a chair using your arms (e.g.,  wheelchair or bedside chair)?: None Help needed to walk in hospital room?: None Help needed climbing 3-5 steps with a railing? : None 6 Click Score: 24    End of Session   Activity Tolerance: Patient tolerated treatment well Patient left: in bed;with call bell/phone within reach Nurse Communication: Mobility status PT Visit Diagnosis: Other abnormalities of gait and mobility (R26.89)    Time: 8771-8757 PT Time Calculation (min) (ACUTE ONLY): 14 min   Charges:   PT Evaluation $PT Eval Low Complexity: 1 Low   PT General Charges $$ ACUTE PT VISIT: 1 Visit         Lenoard SQUIBB, PT Acute Rehabilitation Services Office: [phone]   Lenoard NOVAK Kalvin Buss 09/25/2024, 1:09 PM

## 2024-09-25 NOTE — ED Notes (Signed)
 Paper Consent was signed by PT and verified by physician

## 2024-09-25 NOTE — Progress Notes (Signed)
 FMTS Interim Progress Note  Patient feeling well this morning. Ate breakfast. Frustrated by seeing multiple doctors and started on new medication. She feels like she is taking too many medications.   O: BP (!) 114/57   Pulse 71   Temp 98.3 F (36.8 C) (Oral)   Resp 13   Ht 5' 3 (1.6 m)   Wt 86.6 kg   SpO2 99%   BMI 33.82 kg/m   General: Chronically ill-appearing, no acute distress Cardio: RRR, no murmur on exam Pulm: clear, No increased work of breathing Abdomen: Soft, bowel sounds present, nontender Extremity: +1 peripheral edema bilaterally    A/P:  Doing well this morning. Planning for echo today. If creatinine does not improve with PO intake and echo comes back WNL can consider IVF. Holding HTN meds until Cr improved.  Assessment & Plan Hypokalemia Hypomagnesemia S/P repletion - Recheck BMP and Mg at 10 AM  - Goal Mag >2, K >4  Hypotension Pressures continue to be soft most likely 2/2 to volume depletion  - Hold amlodipine -valsartan  5-160 mg - encourage PO intake - hold fluids until echo results  Atrial fibrillation (HCC) S/P cardioversion 10/18 now NSR  - Echo pending  - Xarelto  20 mg daily  - Metoprolol  succinate 25 mg daily  Acute kidney injury superimposed on stage 3a chronic kidney disease (HCC) Baseline Cr <1. Most likely prerenal in the setting of poor PO intake  - Hold spironolactone  25 mg daily - daily BMP Chronic health problem HLD  - continue bempedoic acid-ezetimibe  180-10 mg daily, Crestor  40 mg daily T2DM  - hold home metformin  1000 mg twice daily and Invokana  100 mg daily, - ACHS sensitive sliding scale insulin  with last A1c 6.1 6 months ago, - will recheck A1c  - consider stopping diabetic medication as patient's A1c has historically been well controlled and she does not like taking medications  Restless legs  - hold tizanidine  4 mg nightly, patient would like to have this Dc'd at discharge     Cleotilde Perkins, DO 09/25/2024, 8:01 AM PGY-3,  [facility] Health Family Medicine Service pager [phone]

## 2024-09-26 ENCOUNTER — Observation Stay (HOSPITAL_COMMUNITY)

## 2024-09-26 DIAGNOSIS — I4891 Unspecified atrial fibrillation: Secondary | ICD-10-CM | POA: Diagnosis not present

## 2024-09-26 LAB — GLUCOSE, CAPILLARY
Glucose-Capillary: 65 mg/dL — ABNORMAL LOW (ref 70–99)
Glucose-Capillary: 69 mg/dL — ABNORMAL LOW (ref 70–99)
Glucose-Capillary: 97 mg/dL (ref 70–99)
Glucose-Capillary: 173 mg/dL — ABNORMAL HIGH (ref 70–99)

## 2024-09-26 LAB — BASIC METABOLIC PANEL WITH GFR
Anion gap: 11 (ref 5–15)
BUN: 6 mg/dL — ABNORMAL LOW (ref 8–23)
CO2: 20 mmol/L — ABNORMAL LOW (ref 22–32)
Calcium: 8.9 mg/dL (ref 8.9–10.3)
Chloride: 111 mmol/L (ref 98–111)
Creatinine, Ser: 0.84 mg/dL (ref 0.44–1.00)
GFR, Estimated: >60 mL/min (ref >60)
Glucose, Bld: 76 mg/dL (ref 70–99)
Potassium: 4.3 mmol/L (ref 3.5–5.1)
Sodium: 142 mmol/L (ref 135–145)

## 2024-09-26 LAB — ECHOCARDIOGRAM COMPLETE
Area-P 1/2: 3.6 cm2
Height: 63 in
S' Lateral: 2.2 cm
Weight: 3054.69 [oz_av]

## 2024-09-26 LAB — MAGNESIUM: Magnesium: 1.9 mg/dL (ref 1.7–2.4)

## 2024-09-26 NOTE — Progress Notes (Signed)
 Discharge Nurse Summary: DC order noted per MD. DC RN at bedside with patient appearing anxious for discharge stating she needs more help understanding how to take her meds at home. Patient agreeable with discharge plan, states family outside for pickup. AVS in discharge packet given to the patient prior to dc RN arrival to the bedside reviewed with the patient. Patient required extensive time and reinforcement reviewing the med schedule. After the patient was able to complete multiple teach-back accounts, the patient was ready to go.  PIV not present on assessment, skin intact. No DME needs. No home meds/TOC meds. CP/Edu resolved. Telemonitor not present on assessment. All belongings accounted for. Patient wheeled downstairs for discharge. While waiting for sister to arrive, patient required additional time with teach-back education for reinforcement. Same per discussion with the patient's sister also lacking intact memory/comprehension. After more time and reinforcement, the patient/sister verbalized understanding and were recommended to discuss any further concerns with their family medicine provider at the next appt this Tuesday, verbalized understanding. Patient discharged via private auto.   Rosario EMERSON Lund, RN

## 2024-09-26 NOTE — Discharge Summary (Signed)
 Family Medicine Teaching [facility] Discharge Summary  Patient name: Meredith [name] Medical record number: [id number] Date of birth: [birth year] Age: 73 y.o. Gender: female Date of Admission: 09/24/2024  Date of Discharge: 09/26/2024 Admitting Physician: Meredith JINNY Legions, MD  Primary Care Provider: Cleotilde Perkins, DO Consultants: None  Indication for Hospitalization: afib rvr with hypokalemia  Discharge Diagnoses/Problem List:  Principal Problem for Admission: afib rvr with hypokalemia Other Problems addressed during stay:  Principal Problem:   Hypokalemia Active Problems:   Hypomagnesemia   Atrial fibrillation [facility])   Hypotension  Brief Hospital Course:  This is a 73 year old female with a past medical history of HLD, HTN, T2DM, restless legs, CKD, A-fib who was admitted to the family medicine teaching service for hypokalemia after successful cardioversion for atrial fibrillation with RVR.  Her hospital course is detailed below:  Hypokalemia This is a chronic problem for which she was previously on daily supplementation.  Potassium was 2.7 in the ED.  40 mill equivalents IV ordered in the ED.  40 mill equivalents p.o. ordered after.  Magnesium  notably low at 1.3.  2 g repleted.  Subsequent BMP revealed electrolytes within normal limits.  A-fib with RVR Patient presented to the ED tachycardic and hypotensive, found to be in A-fib with RVR.  2 L IV fluids was administered. Cardioversion in the ED was successful, and patient's initial dizziness and chest pain resolved.  MAP improved and EKG revealing normal sinus rhythm. Echocardiogram reassuring though with mild PAH.  She remained in NSR over admission.  PCP recommendations: Counseling regarding medication uses and adherence as patient feels she is on too many medications and her medications are causing her problems Education on the patient's cardiology team and cardiology medication recommendations for prevention of  RVR Follow up with cardiology about mild PAH Follow up need to resume Exforge  and spironolactone  (held over admission and at discharge due to lower BP)  Results/Tests Pending at Time of Discharge:  Unresulted Labs (From admission, onward)     Start     Ordered   09/27/24 0500  Basic metabolic panel with GFR  Tomorrow morning,   R       Question:  Specimen collection method  Answer:  Lab=Lab collect   09/26/24 0801   09/27/24 0500  Magnesium   Tomorrow morning,   R       Question:  Specimen collection method  Answer:  Lab=Lab collect   09/26/24 0801   09/27/24 0500  CBC with Differential/Platelet  Tomorrow morning,   R       Question:  Specimen collection method  Answer:  Lab=Lab collect   09/26/24 1404            Disposition: home  Discharge Condition: stable  Discharge Exam per Dr. Toma:  Vitals:   09/26/24 0828 09/26/24 1122  BP: (!) 140/89 118/64  Pulse: 67 77  Resp:    Temp: 98.1 F (36.7 C)   SpO2: 100% 99%   General: Age-appropriate, resting comfortably in bed, NAD, alert and at baseline. Cardiovascular: Regular rate and rhythm. Normal S1/S2. No murmurs, rubs, or gallops appreciated. 2+ radial pulses. Pulmonary: Clear bilaterally to auscultation; no wheezes, crackles, or rhonchi. Normal WOB on room air, no accessory muscle usage. Abdominal: Normoactive bowel sounds, nondistended.  Soft.  No tenderness to deep or light palpation. No rebound or guarding. No HSM. Skin: Warm and dry. Extremities: Trace peripheral edema bilaterally. Capillary refill 2 seconds.  Significant Labs and Imaging:  Recent Labs  Lab  09/24/24 2228 09/24/24 2240  WBC 6.1  --   HGB 11.4* 11.2*  10.9*  HCT 34.7* 33.0*  32.0*  PLT 195  --    Recent Labs  Lab 09/24/24 2228 09/24/24 2240 09/25/24 0029 09/25/24 1024 09/25/24 1825 09/26/24 0610  NA 141 142  143  --  142 142 142  K 2.7* 2.7*  2.7*  --  3.0* 3.7 4.3  CL 105 105  --  106 108 111  CO2 21*  --   --  24 23 20*   GLUCOSE 126* 118*  --  115* 113* 76  BUN 13 15  --  10 10 6*  CREATININE 1.21* 1.20*  --  0.98 1.01* 0.84  CALCIUM  8.6*  --   --  8.7* 8.9 8.9  MG  --   --  1.3* 1.5* 1.8 1.9  ALKPHOS 31*  --   --   --   --   --   AST 20  --   --   --   --   --   ALT 17  --   --   --   --   --   ALBUMIN 3.0*  --   --   --   --   --    ECHOCARDIOGRAM COMPLETE Result Date: 09/26/2024    ECHOCARDIOGRAM REPORT   Patient Name:   Meredith [name] Date of Exam: 09/26/2024 Medical Rec #:  [id number]       Height:       63.0 in Accession #:    [id number]      Weight:       190.9 lb Date of Birth:  09-04-1951       BSA:          1.896 m Patient Age:    73 years        BP:           118/64 mmHg Patient Gender: F               HR:           76 bpm. Exam Location:  Inpatient Procedure: 2D Echo (Both Spectral and Color Flow Doppler were utilized during            procedure). Indications:    atrial fibrillation  History:        Patient has prior history of Echocardiogram examinations, most                 recent 11/29/2014. CAD, chronic kidney disease; Risk                 Factors:Hypertension, Diabetes and Dyslipidemia.  Sonographer:    Meredith [name] RDCS Referring Phys: [phone] Meredith [name] IMPRESSIONS  1. Left ventricular ejection fraction, by estimation, is 60 to 65%. The left ventricle has normal function. The left ventricle has no regional wall motion abnormalities. There is mild left ventricular hypertrophy. Left ventricular diastolic parameters were normal.  2. Right ventricular systolic function is normal. The right ventricular size is normal. There is mildly elevated pulmonary artery systolic pressure.  3. Left atrial size was mildly dilated.  4. The mitral valve is abnormal. No evidence of mitral valve regurgitation. No evidence of mitral stenosis.  5. The aortic valve is tricuspid. There is mild calcification of the aortic valve. Aortic valve regurgitation is trivial. Aortic valve sclerosis is present, with no  evidence of aortic valve stenosis.  6. The inferior vena cava is  normal in size with greater than 50% respiratory variability, suggesting right atrial pressure of 3 mmHg. FINDINGS  Left Ventricle: Left ventricular ejection fraction, by estimation, is 60 to 65%. The left ventricle has normal function. The left ventricle has no regional wall motion abnormalities. Strain was performed and the global longitudinal strain is indeterminate. The left ventricular internal cavity size was normal in size. There is mild left ventricular hypertrophy. Left ventricular diastolic parameters were normal. Right Ventricle: The right ventricular size is normal. No increase in right ventricular wall thickness. Right ventricular systolic function is normal. There is mildly elevated pulmonary artery systolic pressure. The tricuspid regurgitant velocity is 3.08  m/s, and with an assumed right atrial pressure of 3 mmHg, the estimated right ventricular systolic pressure is 40.9 mmHg. Left Atrium: Left atrial size was mildly dilated. Right Atrium: Right atrial size was normal in size. Pericardium: There is no evidence of pericardial effusion. Mitral Valve: The mitral valve is abnormal. There is mild thickening of the mitral valve leaflet(s). There is mild calcification of the mitral valve leaflet(s). Mild mitral annular calcification. No evidence of mitral valve regurgitation. No evidence of mitral valve stenosis. Tricuspid Valve: The tricuspid valve is normal in structure. Tricuspid valve regurgitation is mild . No evidence of tricuspid stenosis. Aortic Valve: The aortic valve is tricuspid. There is mild calcification of the aortic valve. Aortic valve regurgitation is trivial. Aortic valve sclerosis is present, with no evidence of aortic valve stenosis. Pulmonic Valve: The pulmonic valve was normal in structure. Pulmonic valve regurgitation is not visualized. No evidence of pulmonic stenosis. Aorta: The aortic root is normal in size and  structure. Venous: The inferior vena cava is normal in size with greater than 50% respiratory variability, suggesting right atrial pressure of 3 mmHg. IAS/Shunts: No atrial level shunt detected by color flow Doppler. Additional Comments: 3D was performed not requiring image post processing on an independent workstation and was indeterminate.  LEFT VENTRICLE PLAX 2D LVIDd:         3.70 cm   Diastology LVIDs:         2.20 cm   LV e' medial:    8.16 cm/s LV PW:         0.90 cm   LV E/e' medial:  7.8 LV IVS:        1.20 cm   LV e' lateral:   9.90 cm/s LVOT diam:     1.90 cm   LV E/e' lateral: 6.4 LV SV:         71 LV SV Index:   37 LVOT Area:     2.84 cm  RIGHT VENTRICLE            IVC RV Basal diam:  2.60 cm    IVC diam: 1.50 cm RV S prime:     8.38 cm/s TAPSE (M-mode): 1.9 cm LEFT ATRIUM             Index        RIGHT ATRIUM           Index LA diam:        3.80 cm 2.00 cm/m   RA Area:     16.50 cm LA Vol (A2C):   68.1 ml 35.92 ml/m  RA Volume:   41.70 ml  22.00 ml/m LA Vol (A4C):   51.3 ml 27.06 ml/m LA Biplane Vol: 62.2 ml 32.81 ml/m  AORTIC VALVE LVOT Vmax:   120.00 cm/s LVOT Vmean:  78.500 cm/s LVOT VTI:  0.250 m  AORTA Ao Root diam: 3.30 cm Ao Asc diam:  3.30 cm MITRAL VALVE               TRICUSPID VALVE MV Area (PHT): 3.60 cm    TR Peak grad:   37.9 mmHg MV Decel Time: 211 msec    TR Vmax:        308.00 cm/s MV E velocity: 63.60 cm/s MV A velocity: 65.60 cm/s  SHUNTS MV E/A ratio:  0.97        Systemic VTI:  0.25 m                            Systemic Diam: 1.90 cm Maude Emmer MD Electronically signed by Maude Emmer MD Signature Date/Time: 09/26/2024/2:16:16 PM    Final    DG Chest Port 1 View Result Date: 09/24/2024 EXAM: 1 VIEW(S) XRAY OF THE CHEST 09/24/2024 10:41:15 PM COMPARISON: Chest x-ray 09/14/2024 and CT c-spine 10/31/2021. CLINICAL HISTORY: Shortness of breath, hypotension. FINDINGS: LUNGS AND PLEURA: Chronic coarsened interstitial markings. No focal pulmonary opacity. No pulmonary  edema. No pleural effusion. No pneumothorax. HEART AND MEDIASTINUM: Unchanged cardiomediastinal silhouette. BONES AND SOFT TISSUES: Patient is rotated. No acute osseous abnormality. IMPRESSION: 1. No acute cardiopulmonary process. 2. Chronic coarsened interstitial markings with question underlying emphysema. Electronically signed by: Morgane Naveau MD 09/24/2024 11:10 PM EDT RP Workstation: [id number]     Discharge Medications:  Allergies as of 09/26/2024       Reactions   Lisinopril  Anaphylaxis, Shortness Of Breath, Swelling   Mouth and throat became swollen   Doxycycline  Swelling   Possibly caused swelling of lips and hives in May 2019, timeline unclear   Flexeril [cyclobenzaprine Hcl] Hives, Itching   Lipitor [atorvastatin Calcium ] Other (See Comments)   Myalgia   Metaxalone Hives, Itching           Medication List     PAUSE taking these medications    amLODipine -valsartan  5-160 MG tablet Wait to take this until your doctor or other care provider tells you to start again. Commonly known as: EXFORGE  TAKE 1 TABLET BY MOUTH EVERY DAY   spironolactone  25 MG tablet Wait to take this until your doctor or other care provider tells you to start again. Commonly known as: ALDACTONE  Take 25 mg by mouth daily.       STOP taking these medications    tiZANidine  4 MG tablet Commonly known as: Zanaflex        TAKE these medications    Invokana  100 MG Tabs tablet Generic drug: canagliflozin  TAKE 1 TABLET BY MOUTH DAILY BEFORE BREAKFAST.   metFORMIN  1000 MG tablet Commonly known as: GLUCOPHAGE  Take 1 tablet (1,000 mg total) by mouth 2 (two) times daily with a meal.   metoprolol  succinate 25 MG 24 hr tablet Commonly known as: TOPROL -XL Take 75 mg by mouth daily. Take with or immediately following a meal   Nexlizet  180-10 MG Tabs Generic drug: Bempedoic Acid-Ezetimibe  Take 1 tablet by mouth daily.   rivaroxaban  20 MG Tabs tablet Commonly known as: Xarelto  Take 1 tablet  (20 mg total) by mouth daily.   rosuvastatin  40 MG tablet Commonly known as: CRESTOR  Take 1 tablet (40 mg total) by mouth daily.        Discharge Instructions: Please refer to Patient Instructions section of EMR for full details.  Patient was counseled important signs and symptoms that should prompt return to medical care, changes in medications, dietary  instructions, activity restrictions, and follow up appointments.   Follow-Up Appointments:  Follow-up Information     Meredith Perkins, DO Follow up on 09/28/2024.   Specialty: Family Medicine Why: at 10:10 AM for hospital follow up Contact information: [address] [place] [address] [phone]                 Tharon Lung, MD 09/26/2024, 3:44 PM PGY-3, [facility]

## 2024-09-26 NOTE — Discharge Instructions (Signed)
 Dear [name],  Thank you for letting us  participate in your care. You were hospitalized for atrial fibrillation with rapid ventricular response and diagnosed with Hypokalemia. You were treated with cardioversion and medications.   POST-HOSPITAL & CARE INSTRUCTIONS Be sure to take all your medications as listed in this packet. Go to your follow up appointments (listed below)  DOCTOR'S APPOINTMENT   Future Appointments  Date Time Provider Department Center  09/28/2024 10:10 AM Cleotilde Perkins, DO [facility]  10/04/2024 10:30 AM McLaurin, Nichole LABOR, RN [facility] None  10/07/2024 11:30 AM Munoz-Lopez, [place] [facility] None  11/11/2024  9:10 AM [facility] ANNUAL WELLNESS VISIT [facility]     Take care and be well!  Family Medicine Teaching Service Inpatient Team   [facility]  [address] [place], [place] [address] [phone]

## 2024-09-26 NOTE — Assessment & Plan Note (Addendum)
 Replenishing daily. - AM BMP and Mag pending, will likely not need PM check - Goal Mag >2, K >4 given A-fib

## 2024-09-26 NOTE — Progress Notes (Signed)
 Daily Progress Note Intern Pager: [phone]  Patient name: Meredith [name] Medical record number: [id number] Date of birth: Aug 11, 1951 Age: 73 y.o. Gender: female  Primary Care Provider: Cleotilde Perkins, DO Consultants: None Code Status: Full  Pt Overview and Major Events to Date:  10/18 - Admitted, cardioverted in the ED to NSR from A-fib RVR   Medical Decision Making Meredith [name] is 73 y.o. who presented with nausea, chest pain and lightheadedness; she was admitted for A-fib RVR and hypokalemia.  Pertinent PMH/PSH includes persistent A-fib, HTN, T2DM, CKD 3A, and HLD.  Awaiting echo.  Patient states she is drinking fluids consistently.  RN unable to place PIV, including with assistance from PIV team.  Given that patient has no significant IV meds at this time, will hold. Assessment & Plan Hypokalemia Hypomagnesemia Replenishing daily. - AM BMP and Mag pending, will likely not need PM check - Goal Mag >2, K >4 given A-fib Hypotension BP remains normotensive on metoprolol . - Hold amlodipine -valsartan  5-160 mg and spironolactone  - Encourage PO intake Atrial fibrillation (HCC) S/P cardioversion 10/18, remains in NSR. - Echo pending - Xarelto  20 mg daily  - Metoprolol  succinate 25 mg daily  Acute kidney injury superimposed on stage 3a chronic kidney disease (HCC) (Resolved: 09/26/2024) Baseline Cr ~1. Most likely prerenal in the setting of poor PO intake.  Resolved as of 10/18. - Daily BMP Chronic health problem HLD  - continue bempedoic acid-ezetimibe  180-10 mg daily, Crestor  40 mg daily T2DM  - hold home metformin  1000 mg twice daily and Invokana  100 mg daily - ACHS sensitive sliding scale insulin  with last A1c 6.1 6 months ago - will recheck A1c  - consider stopping diabetic medication as patient's A1c has historically been well controlled and she does not like taking medications  Restless legs  - hold tizanidine  4 mg nightly, patient would like to have this Dc'd at  discharge   FEN/GI: Regular diet PPx: Xarelto  Dispo: Home pending clinical improvement . Barriers include hypokalemia, poor oral intake.  Subjective:  This AM, patient states that she is feeling well.  She is resting in bed comfortably.  She states that she does not feel any palpitations or chest pain.  Reports that she has been making an effort to drink water frequently.  States she does not like the food in the hospital and does not feel like eating much.  Objective: Temp:  [98.1 F (36.7 C)-99 F (37.2 C)] 98.1 F (36.7 C) (10/19 0412) Pulse Rate:  [69-78] 72 (10/19 0412) Resp:  [13-19] 18 (10/19 0412) BP: (101-132)/(52-113) 130/74 (10/19 0412) SpO2:  [97 %-100 %] 100 % (10/19 0412)  Physical Exam: General: Age-appropriate, resting comfortably in bed, NAD, alert and at baseline. Cardiovascular: Regular rate and rhythm. Normal S1/S2. No murmurs, rubs, or gallops appreciated. 2+ radial pulses. Pulmonary: Clear bilaterally to auscultation; no wheezes, crackles, or rhonchi. Normal WOB on room air, no accessory muscle usage. Abdominal: Normoactive bowel sounds, nondistended.  Soft.  No tenderness to deep or light palpation. No rebound or guarding. No HSM. Skin: Warm and dry. Extremities: Trace peripheral edema bilaterally. Capillary refill 2 seconds.  Laboratory: Most recent CBC Lab Results  Component Value Date   WBC 6.1 09/24/2024   HGB 10.9 (L) 09/24/2024   HGB 11.2 (L) 09/24/2024   HCT 32.0 (L) 09/24/2024   HCT 33.0 (L) 09/24/2024   MCV 78.9 (L) 09/24/2024   PLT 195 09/24/2024   Most recent BMP    Latest Ref Rng &  Units 09/25/2024    6:25 PM  BMP  Glucose 70 - 99 mg/dL 886   BUN 8 - 23 mg/dL 10   Creatinine 9.55 - 1.00 mg/dL 8.98   Sodium 864 - 854 mmol/L 142   Potassium 3.5 - 5.1 mmol/L 3.7   Chloride 98 - 111 mmol/L 108   CO2 22 - 32 mmol/L 23   Calcium  8.9 - 10.3 mg/dL 8.9     Other pertinent labs: - BMP and mag pending  New Imaging/Diagnostic Tests: -  Echo not yet obtained  Daronte Shostak Toma, MD 09/26/2024, 4:34 AM  PGY-2, [place] [facility] FPTS Intern pager: [phone], text pages welcome Secure chat group [name] Teaching Service

## 2024-09-26 NOTE — Progress Notes (Signed)
  Echocardiogram 2D Echocardiogram has been performed.  LAMON MAXWELL 09/26/2024, 2:03 PM

## 2024-09-26 NOTE — Progress Notes (Signed)
 Order received to leave IV out per Claudetta Silence, MD.

## 2024-09-26 NOTE — Assessment & Plan Note (Addendum)
 Baseline Cr ~1. Most likely prerenal in the setting of poor PO intake.  Resolved as of 10/18. - Daily BMP

## 2024-09-26 NOTE — Assessment & Plan Note (Addendum)
 HLD  - continue bempedoic acid-ezetimibe  180-10 mg daily, Crestor  40 mg daily T2DM  - hold home metformin  1000 mg twice daily and Invokana  100 mg daily - ACHS sensitive sliding scale insulin  with last A1c 6.1 6 months ago - will recheck A1c  - consider stopping diabetic medication as patient's A1c has historically been well controlled and she does not like taking medications  Restless legs  - hold tizanidine  4 mg nightly, patient would like to have this Dc'd at discharge

## 2024-09-26 NOTE — Assessment & Plan Note (Addendum)
 BP remains normotensive on metoprolol . - Hold amlodipine -valsartan  5-160 mg and spironolactone  - Encourage PO intake

## 2024-09-26 NOTE — Assessment & Plan Note (Addendum)
 S/P cardioversion 10/18, remains in NSR. - Echo pending - Xarelto  20 mg daily  - Metoprolol  succinate 25 mg daily

## 2024-09-26 NOTE — Plan of Care (Signed)

## 2024-09-27 ENCOUNTER — Telehealth: Payer: Self-pay

## 2024-09-27 NOTE — Transitions of Care (Post Inpatient/ED Visit) (Signed)
   09/27/2024  Name: Meredith [name] MRN: [id number] DOB: [birth year]  Today's TOC FU Call Status: Today's TOC FU Call Status:: Successful TOC FU Call Completed TOC FU Call Complete Date: 09/27/24 Patient's Name and Date of Birth confirmed.  Transition Care Management Follow-up Telephone Call Date of Discharge: 09/26/24 Discharge Facility: [facility]) Type of Discharge: Inpatient Admission Primary Inpatient Discharge Diagnosis:: dizziness How have you been since you were released from the hospital?: Better Any questions or concerns?: No  Items Reviewed: Did you receive and understand the discharge instructions provided?: Yes Medications obtained,verified, and reconciled?: Yes (Medications Reviewed) Any new allergies since your discharge?: No Dietary orders reviewed?: Yes Do you have support at home?: Yes People in Home [RPT]: child(ren), adult  Medications Reviewed Today: Medications Reviewed Today     Reviewed by Emmitt Pan, LPN (Licensed Practical Nurse) on 09/27/24 at 1348  Med List Status: <None>   Medication Order Taking? Sig Documenting Provider Last Dose Status Informant  amLODipine -valsartan  (EXFORGE ) 5-160 MG tablet [id number]  TAKE 1 TABLET BY MOUTH EVERY DAY  Patient not taking: Reported on 09/27/2024   Cleotilde Perkins, DO  Active Self, Pharmacy Records  Bempedoic Acid-Ezetimibe  (NEXLIZET ) 180-10 MG TABS [id number] Yes Take 1 tablet by mouth daily. Shlomo Wilbert SAUNDERS, MD  Active Self, Pharmacy Records  INVOKANA  100 MG TABS tablet [id number] Yes TAKE 1 TABLET BY MOUTH DAILY BEFORE BREAKFAST. Cleotilde Perkins, DO  Active Self, Pharmacy Records  metFORMIN  (GLUCOPHAGE ) 1000 MG tablet [id number] Yes Take 1 tablet (1,000 mg total) by mouth 2 (two) times daily with a meal. Cleotilde Perkins, DO  Active Self, Pharmacy Records  metoprolol  succinate (TOPROL -XL) 25 MG 24 hr tablet [id number] Yes Take 75 mg by mouth daily. Take with or immediately following a meal [provider]   Active Self, Pharmacy Records  rivaroxaban  (XARELTO ) 20 MG TABS tablet [id number] Yes Take 1 tablet (20 mg total) by mouth daily. Emil Share, DO  Active Self, Pharmacy Records  rosuvastatin  (CRESTOR ) 40 MG tablet [id number] Yes Take 1 tablet (40 mg total) by mouth daily. Shlomo Wilbert SAUNDERS, MD  Active Self, Pharmacy Records  spironolactone  (ALDACTONE ) 25 MG tablet [id number]  Take 25 mg by mouth daily.  Patient not taking: Reported on 09/27/2024   [provider]  Active Self, Pharmacy Records            Home Care and Equipment/Supplies: Were Home Health Services Ordered?: NA Any new equipment or medical supplies ordered?: NA  Functional Questionnaire: Do you need assistance with bathing/showering or dressing?: No Do you need assistance with meal preparation?: No Do you need assistance with eating?: No Do you have difficulty maintaining continence: No Do you need assistance with getting out of bed/getting out of a chair/moving?: No Do you have difficulty managing or taking your medications?: No  Follow up appointments reviewed: PCP Follow-up appointment confirmed?: Yes Date of PCP follow-up appointment?: 09/28/24 Follow-up Provider: [facility] Follow-up appointment confirmed?: NA Do you need transportation to your follow-up appointment?: No Do you understand care options if your condition(s) worsen?: Yes-patient verbalized understanding    SIGNATURE Pan Emmitt, LPN [facility] Nurse Health Advisor Direct Dial [phone]

## 2024-09-28 ENCOUNTER — Other Ambulatory Visit: Payer: Self-pay | Admitting: Student

## 2024-09-28 ENCOUNTER — Other Ambulatory Visit (HOSPITAL_COMMUNITY): Payer: Self-pay

## 2024-09-28 ENCOUNTER — Encounter: Payer: Self-pay | Admitting: Student

## 2024-09-28 ENCOUNTER — Ambulatory Visit (INDEPENDENT_AMBULATORY_CARE_PROVIDER_SITE_OTHER): Admitting: Student

## 2024-09-28 VITALS — BP 151/91 | HR 82 | Ht 63.0 in | Wt 181.2 lb

## 2024-09-28 DIAGNOSIS — I10 Essential (primary) hypertension: Secondary | ICD-10-CM

## 2024-09-28 DIAGNOSIS — I4819 Other persistent atrial fibrillation: Secondary | ICD-10-CM | POA: Diagnosis not present

## 2024-09-28 DIAGNOSIS — E7800 Pure hypercholesterolemia, unspecified: Secondary | ICD-10-CM | POA: Diagnosis not present

## 2024-09-28 DIAGNOSIS — E119 Type 2 diabetes mellitus without complications: Secondary | ICD-10-CM | POA: Diagnosis not present

## 2024-09-28 DIAGNOSIS — I491 Atrial premature depolarization: Secondary | ICD-10-CM | POA: Diagnosis not present

## 2024-09-28 DIAGNOSIS — R000 Tachycardia, unspecified: Secondary | ICD-10-CM | POA: Diagnosis not present

## 2024-09-28 MED ORDER — INVOKANA 100 MG PO TABS
100.0000 mg | ORAL_TABLET | Freq: Every day | ORAL | 3 refills | Status: DC
  Filled 2024-09-28: qty 90, 90d supply, fill #0

## 2024-09-28 MED ORDER — METOPROLOL SUCCINATE ER 25 MG PO TB24
75.0000 mg | ORAL_TABLET | Freq: Every day | ORAL | 3 refills | Status: DC
Start: 2024-09-28 — End: 2024-10-13
  Filled 2024-09-28: qty 90, 30d supply, fill #0

## 2024-09-28 MED ORDER — ROSUVASTATIN CALCIUM 40 MG PO TABS
40.0000 mg | ORAL_TABLET | Freq: Every day | ORAL | 0 refills | Status: DC
Start: 2024-09-28 — End: 2024-09-28
  Filled 2024-09-28: qty 30, 30d supply, fill #0

## 2024-09-28 MED ORDER — ROSUVASTATIN CALCIUM 40 MG PO TABS
40.0000 mg | ORAL_TABLET | Freq: Every day | ORAL | 0 refills | Status: AC
  Filled 2024-09-28 (×2): qty 90, 90d supply, fill #0

## 2024-09-28 MED ORDER — ROSUVASTATIN CALCIUM 40 MG PO TABS: 40.0000 mg | ORAL_TABLET | Freq: Every day | ORAL | 0 refills | Status: DC

## 2024-09-28 MED ORDER — ROSUVASTATIN CALCIUM 40 MG PO TABS
40.0000 mg | ORAL_TABLET | Freq: Every day | ORAL | 3 refills | Status: DC
Start: 2024-09-28 — End: 2024-09-28
  Filled 2024-09-28: qty 90, 90d supply, fill #0

## 2024-09-28 MED ORDER — VALSARTAN 160 MG PO TABS
160.0000 mg | ORAL_TABLET | Freq: Every day | ORAL | 3 refills | Status: DC
  Filled 2024-09-28: qty 90, 90d supply, fill #0

## 2024-09-28 MED ORDER — NEXLIZET 180-10 MG PO TABS
1.0000 | ORAL_TABLET | Freq: Every day | ORAL | 3 refills | Status: DC
  Filled 2024-09-28: qty 90, 90d supply, fill #0

## 2024-09-28 MED ORDER — RIVAROXABAN 20 MG PO TABS
20.0000 mg | ORAL_TABLET | Freq: Every day | ORAL | 0 refills | Status: DC
  Filled 2024-09-28 – 2024-10-13 (×2): qty 90, 90d supply, fill #0

## 2024-09-28 NOTE — Progress Notes (Signed)
    SUBJECTIVE:   CHIEF COMPLAINT / HPI:   Atrial fibrillation and hospitalization - Hospitalized from October 17 to September 26, 2024, for atrial fibrillation with rapid ventricular response - Converted to normal sinus rhythm during hospitalization - Currently taking Xarelto  and metoprolol  as part of her regimen  Electrolyte disturbance: hypokalemia - Experienced hypokalemia during hospitalization with a potassium level of 2.7 mmol/L, requiring repletion  Medication management and adverse effects - Current medications include Xarelto , Crestor , ezetimibe , metoprolol , Invokana , and valsartan  - Blood pressure medications (Exforge  and spironolactone ) were held during hospitalization due to hypotension - Finds medication regimen confusing and difficult to manage - Experiences nausea and stomach upset with some medications - Interested in medication delivery and pill pack organization to reduce confusion  PERTINENT  PMH / PSH: reviewed and updated.  OBJECTIVE:   BP (!) 151/91   Pulse 82   Ht 5' 3 (1.6 m)   Wt 181 lb 3.2 oz (82.2 kg)   SpO2 100%   BMI 32.10 kg/m   Well-appearing, no acute distress Cardio: Regular rate, regularly irregular rhythm, no murmurs on exam. Pulm: Clear, no wheezing, no crackles. No increased work of breathing Abdominal: bowel sounds present, soft, non-tender, non-distended Extremities: no peripheral edema   ASSESSMENT/PLAN:   Assessment & Plan HYPERTENSION, BENIGN SYSTEMIC Blood pressure elevated; valsartan  selected for blood pressure control and potassium improvement. - Discontinue amlodipine . - Start valsartan . - Monitor blood pressure and electrolytes in 2-3 weeks. Controlled type 2 diabetes mellitus without complication, without long-term current use of insulin  (HCC) Diabetes well controlled; metformin  discontinued due to side effects, Invokana  continued for additional benefits. A1c 6.0 in the hospital  - Discontinue metformin . - Continue  Invokana . Persistent atrial fibrillation (HCC) - Continue Xarelto  for anticoagulation. - Continue metoprolol  for rate control. HYPERCHOLESTEROLEMIA - Continue rosuvastatin  and ezetimibe .   Medication management and adherence Confusion with regimen; transition to pill pack system to improve adherence. - Transition to pill pack system from [facility].  Damien Pinal, DO [facility]

## 2024-09-28 NOTE — Patient Instructions (Signed)
 It was great to see you today!   Come back in 1-2 weeks   Future Appointments  Date Time Provider Department Center  09/28/2024 10:10 AM Cleotilde Perkins, DO [facility]  10/04/2024 10:30 AM McLaurin, Nichole LABOR, RN [facility] None  10/07/2024 11:30 AM Munoz-Lopez, [place] [facility] None  11/11/2024  9:10 AM [facility] ANNUAL WELLNESS VISIT [facility]    Please arrive 15 minutes before your appointment to ensure smooth check in process.  If you are more than 15 minutes late, you may be asked to reschedule.   Please bring a list of your medications with you to all appointments.   Please call the clinic at [phone] if your symptoms worsen or you have any concerns.  Thank you for allowing me to participate in your care, Dr. Perkins Cleotilde [facility]

## 2024-09-28 NOTE — Assessment & Plan Note (Signed)
 Blood pressure elevated; valsartan  selected for blood pressure control and potassium improvement. - Discontinue amlodipine . - Start valsartan . - Monitor blood pressure and electrolytes in 2-3 weeks.

## 2024-09-28 NOTE — Assessment & Plan Note (Signed)
-   Continue Xarelto  for anticoagulation. - Continue metoprolol  for rate control.

## 2024-09-28 NOTE — Assessment & Plan Note (Signed)
Continue rosuvastatin and ezetimibe.

## 2024-09-28 NOTE — Addendum Note (Signed)
 Addended by: CLEOTILDE PERKINS on: 09/28/2024 12:24 PM   Modules accepted: Orders

## 2024-09-28 NOTE — Addendum Note (Signed)
 Addended by: Brock Mokry on: 09/28/2024 12:49 PM   Modules accepted: Orders

## 2024-09-29 ENCOUNTER — Other Ambulatory Visit (HOSPITAL_COMMUNITY): Payer: Self-pay

## 2024-09-29 ENCOUNTER — Other Ambulatory Visit: Payer: Self-pay

## 2024-09-29 ENCOUNTER — Emergency Department (HOSPITAL_COMMUNITY)

## 2024-09-29 ENCOUNTER — Encounter (HOSPITAL_COMMUNITY): Payer: Self-pay

## 2024-09-29 ENCOUNTER — Emergency Department (HOSPITAL_COMMUNITY)
Admission: EM | Admit: 2024-09-29 | Discharge: 2024-09-29 | Disposition: A | Discharge status: Home / Self Care | Attending: Emergency Medicine | Admitting: Emergency Medicine

## 2024-09-29 DIAGNOSIS — R7989 Other specified abnormal findings of blood chemistry: Secondary | ICD-10-CM | POA: Diagnosis not present

## 2024-09-29 DIAGNOSIS — I4891 Unspecified atrial fibrillation: Secondary | ICD-10-CM | POA: Diagnosis not present

## 2024-09-29 DIAGNOSIS — I119 Hypertensive heart disease without heart failure: Secondary | ICD-10-CM | POA: Diagnosis not present

## 2024-09-29 DIAGNOSIS — R0789 Other chest pain: Secondary | ICD-10-CM | POA: Diagnosis present

## 2024-09-29 DIAGNOSIS — I4510 Unspecified right bundle-branch block: Secondary | ICD-10-CM | POA: Diagnosis not present

## 2024-09-29 DIAGNOSIS — I491 Atrial premature depolarization: Secondary | ICD-10-CM | POA: Diagnosis not present

## 2024-09-29 DIAGNOSIS — E119 Type 2 diabetes mellitus without complications: Secondary | ICD-10-CM | POA: Diagnosis not present

## 2024-09-29 DIAGNOSIS — R140 Abdominal distension (gaseous): Secondary | ICD-10-CM | POA: Diagnosis not present

## 2024-09-29 DIAGNOSIS — R9431 Abnormal electrocardiogram [ECG] [EKG]: Secondary | ICD-10-CM | POA: Diagnosis not present

## 2024-09-29 DIAGNOSIS — I480 Paroxysmal atrial fibrillation: Secondary | ICD-10-CM | POA: Diagnosis not present

## 2024-09-29 DIAGNOSIS — R079 Chest pain, unspecified: Secondary | ICD-10-CM | POA: Diagnosis not present

## 2024-09-29 DIAGNOSIS — R002 Palpitations: Secondary | ICD-10-CM | POA: Diagnosis not present

## 2024-09-29 DIAGNOSIS — Z7982 Long term (current) use of aspirin: Secondary | ICD-10-CM | POA: Diagnosis not present

## 2024-09-29 DIAGNOSIS — R000 Tachycardia, unspecified: Secondary | ICD-10-CM | POA: Diagnosis not present

## 2024-09-29 LAB — CBC
HCT: 36.9 % (ref 36.0–46.0)
Hemoglobin: 12.4 g/dL (ref 12.0–15.0)
MCH: 26.2 pg (ref 26.0–34.0)
MCHC: 33.6 g/dL (ref 30.0–36.0)
MCV: 77.8 fL — ABNORMAL LOW (ref 80.0–100.0)
Platelets: 193 K/uL (ref 150–400)
RBC: 4.74 MIL/uL (ref 3.87–5.11)
RDW: 16.2 % — ABNORMAL HIGH (ref 11.5–15.5)
WBC: 4.9 K/uL (ref 4.0–10.5)
nRBC: 0 % (ref 0.0–0.2)

## 2024-09-29 LAB — BASIC METABOLIC PANEL WITH GFR
Anion gap: 11 (ref 5–15)
BUN: 8 mg/dL (ref 8–23)
CO2: 25 mmol/L (ref 22–32)
Calcium: 9.7 mg/dL (ref 8.9–10.3)
Chloride: 105 mmol/L (ref 98–111)
Creatinine, Ser: 1.02 mg/dL — ABNORMAL HIGH (ref 0.44–1.00)
GFR, Estimated: 58 mL/min — ABNORMAL LOW (ref >60)
Glucose, Bld: 122 mg/dL — ABNORMAL HIGH (ref 70–99)
Potassium: 3.6 mmol/L (ref 3.5–5.1)
Sodium: 141 mmol/L (ref 135–145)

## 2024-09-29 LAB — TROPONIN I (HIGH SENSITIVITY)
Troponin I (High Sensitivity): 27 ng/L — ABNORMAL HIGH (ref <18)
Troponin I (High Sensitivity): 23 ng/L — ABNORMAL HIGH (ref <18)

## 2024-09-29 LAB — MAGNESIUM: Magnesium: 1.4 mg/dL — ABNORMAL LOW (ref 1.7–2.4)

## 2024-09-29 NOTE — ED Provider Notes (Signed)
 [place] EMERGENCY DEPARTMENT AT [facility] Provider Note   CSN: [id number] Arrival date & time: 09/29/24  9984     Patient presents with: Tachycardia   Meredith [name] is a 73 y.o. female.   presenting with a chief complaint of burning chest pain that woke them up. The patient reports a recent similar episode involving their heart, for which they sought medical attention. The onset of the current episode was sudden upon waking. The patient does not provide additional details regarding the duration, character, or any aggravating or alleviating factors of the chest pain. There is no mention of radiation, timing, or severity.  No associated symptoms besides palpitations and feel like her heart is skipping a beat or beating too fast.  The history was obtained from the patient.        Prior to Admission medications   Medication Sig Start Date End Date Taking? Authorizing Provider  ACCU-CHEK AVIVA PLUS test strip USE AS INSTRUCTED TEST BLOOD GLUCOSE ONCE DAILY. ICD-10 CODE: E11.9 09/28/24   Cleotilde Perkins, DO  Bempedoic Acid-Ezetimibe  (NEXLIZET ) 180-10 MG TABS Take 1 tablet by mouth daily. 09/28/24   Cleotilde Perkins, DO  canagliflozin  (INVOKANA ) 100 MG TABS tablet Take 1 tablet (100 mg total) by mouth daily before breakfast. 09/28/24   Cleotilde Perkins, DO  metoprolol  succinate (TOPROL -XL) 25 MG 24 hr tablet Take 3 tablets (75 mg total) by mouth daily. Take with or immediately following a meal 09/28/24   Cleotilde Perkins, DO  rivaroxaban  (XARELTO ) 20 MG TABS tablet Take 1 tablet (20 mg total) by mouth daily. 09/28/24   Cleotilde Perkins, DO  rosuvastatin  (CRESTOR ) 40 MG tablet Take 1 tablet (40 mg total) by mouth daily. 09/28/24   Cleotilde Perkins, DO  valsartan  (DIOVAN ) 160 MG tablet Take 1 tablet (160 mg total) by mouth at bedtime. 09/28/24   Cleotilde Perkins, DO    Allergies: Lisinopril , Doxycycline , Flexeril [cyclobenzaprine hcl], Lipitor [atorvastatin calcium ], and Metaxalone    Review  of Systems  Updated Vital Signs BP 119/65   Pulse 66   Temp (!) 97.5 F (36.4 C) (Temporal)   Resp 15   Ht 5' 3 (1.6 m)   Wt 81.6 kg   SpO2 98%   BMI 31.89 kg/m   Physical Exam Vitals and nursing note reviewed.  Constitutional:      Appearance: She is well-developed.  HENT:     Head: Normocephalic and atraumatic.  Eyes:     Pupils: Pupils are equal, round, and reactive to light.  Cardiovascular:     Rate and Rhythm: Normal rate and regular rhythm.  Pulmonary:     Effort: No respiratory distress.     Breath sounds: No stridor.  Abdominal:     General: There is no distension.  Musculoskeletal:     Cervical back: Normal range of motion.  Skin:    General: Skin is warm and dry.  Neurological:     General: No focal deficit present.     Mental Status: She is alert.     (all labs ordered are listed, but only abnormal results are displayed) Labs Reviewed  BASIC METABOLIC PANEL WITH GFR - Abnormal; Notable for the following components:      Result Value   Glucose, Bld 122 (*)    Creatinine, Ser 1.02 (*)    GFR, Estimated 58 (*)    All other components within normal limits  CBC - Abnormal; Notable for the following components:   MCV 77.8 (*)  RDW 16.2 (*)    All other components within normal limits  MAGNESIUM  - Abnormal; Notable for the following components:   Magnesium  1.4 (*)    All other components within normal limits  TROPONIN I (HIGH SENSITIVITY) - Abnormal; Notable for the following components:   Troponin I (High Sensitivity) 27 (*)    All other components within normal limits  TROPONIN I (HIGH SENSITIVITY) - Abnormal; Notable for the following components:   Troponin I (High Sensitivity) 23 (*)    All other components within normal limits    EKG: None  Radiology: DG Chest Portable 1 View Result Date: 09/29/2024 EXAM: 1 VIEW XRAY OF THE CHEST 09/29/2024 12:29:00 AM COMPARISON: 10 / 17 / 25 CLINICAL HISTORY: Cardiac symptoms. Table formatting from  the original note was not included. Images from the original note were not included. Patient BIB [facility] from home. Patient woke up out of sleep with feelings of chest burning and heart racing. Prior to this patient had been feeling good per patient. Upon EMS arrival patient was in a-fib with runs of bigeminy. En route patient converted on own into normal sinus and symptoms resolved. A\T\Ox4 VSS en route. FINDINGS: LUNGS AND PLEURA: No focal pulmonary opacity. No pulmonary edema. No pleural effusion. No pneumothorax. HEART AND MEDIASTINUM: No acute abnormality of the cardiac and mediastinal silhouettes. BONES AND SOFT TISSUES: No acute osseous abnormality. IMPRESSION: 1. No acute process. Electronically signed by: Norman Gatlin MD 09/29/2024 12:45 AM EDT RP Workstation: [id number]     Procedures   Medications Ordered in the ED - No data to display                                  Medical Decision Making Amount and/or Complexity of Data Reviewed Labs: ordered. Radiology: ordered.   Patient's are normal troponins and they are flat.  Electrolytes are normal.  Rest of workup is reassuring.  She was observed for a couple hours here on the monitor without any deadly arrhythmias.  She did have episodes of being in A-fib but is mostly in sinus.  She had no complaints while she is here.  Her chest burning could have been from indigestion/heartburn although since associated with the palpitations possibly she had some faster A-fib but she was not ever rapid here.  She is on appropriate medications.  Will have her follow-up with A-fib clinic for further management.  No indication for hospitalization at this time.   Final diagnoses:  Atrial fibrillation, unspecified type [facility])    ED Discharge Orders     None          Tenessa Marsee, Selinda, MD 09/29/24 [phone]

## 2024-09-29 NOTE — ED Triage Notes (Signed)
 Patient BIB [facility] from home. Patient woke up out of sleep with feelings of chest burning and heart racing. Prior to this patient had been feeling good per patient. Upon EMS arrival patient was in a-fib with runs of bigeminy. En route patient converted on own into normal sinus and symptoms resolved. A&Ox4 VSS en route.

## 2024-10-01 ENCOUNTER — Telehealth: Admitting: *Deleted

## 2024-10-04 ENCOUNTER — Telehealth: Payer: Self-pay | Admitting: *Deleted

## 2024-10-07 ENCOUNTER — Other Ambulatory Visit: Payer: Self-pay

## 2024-10-07 NOTE — Patient Instructions (Signed)
 Visit Information  Thank you for taking time to visit with me today. Please don't hesitate to contact me if I can be of assistance to you before our next scheduled appointment.  Your next care management appointment is by telephone on 10/21/2024 at 11AM  Telephone follow up appointment date/time:  10/21/2024 at 11AM.  Please call the care guide team at [phone] if you need to cancel, schedule, or reschedule an appointment.   Please call the Suicide and Crisis Lifeline: 988 go to [facility] [address], [place] [phone]) call 911 if you are experiencing a Mental Health or Behavioral Health Crisis or need someone to talk to.  Laymon Doll, BSW [place]/VBCI - [employer] Social Worker [phone]

## 2024-10-07 NOTE — Patient Outreach (Signed)
 Social Drivers of Health  Community Resource and Care Coordination Visit Note   10/07/2024  Name: Meredith [name] MRN: [id number] DOB:1951-06-28  Situation: Referral received for [facility] needs assessment and assistance related to Housing  Food Insecurity  Disability application assistance. I obtained verbal consent from Patient.  Visit completed with Patient on the phone.   Background:   SDOH Interventions Today    Flowsheet Row Most Recent Value  SDOH Interventions   Food Insecurity Interventions Community Resources Provided  [provided food pantry resources and out of the garden distribution schedules.]  Housing Interventions Community Resources Provided  [provided patient with contact info to [facility] for home repair with mold situaiton.]  Transportation Interventions Patient Resources (Friends/Family)  Utilities Interventions Intervention Not Indicated  Financial Strain Interventions Intervention Not Indicated     Assessment:   Goals Addressed             This Visit's Progress    BSW Goal   On track    Current SDOH Barriers:  Housing barriers Food insecurity SSDI application assistance  Interventions: Patient interviewed and appropriate screenings performed Referred patient to community resources  Provided patient with information about out of the garden food project distribution schedules, food banks, and [employer] (provided phone #). Provided patient with [facility] phone number and encouraged her to call to set up appt for SSDI application assistance.  Provided pt with [facility] phone number for possible home repair and provided brochure via mail.         Recommendation:   attend all scheduled provider appointments follow up with community housing solutions regarding housing needs call and/or follow up with food pantries and out of the garden food project for food assistance  Follow Up Plan:   Telephone follow up appointment date/time:  10/21/2024 at 11AM.    Laymon Doll, BSW [place]/VBCI - [facility] Social Worker [phone]

## 2024-10-08 ENCOUNTER — Other Ambulatory Visit (HOSPITAL_COMMUNITY): Payer: Self-pay

## 2024-10-12 ENCOUNTER — Other Ambulatory Visit (HOSPITAL_COMMUNITY): Payer: Self-pay

## 2024-10-13 ENCOUNTER — Ambulatory Visit (HOSPITAL_COMMUNITY)
Admission: RE | Admit: 2024-10-13 | Discharge: 2024-10-13 | Disposition: A | Discharge status: Home / Self Care | Source: Ambulatory Visit | Attending: Physician Assistant | Admitting: Physician Assistant

## 2024-10-13 ENCOUNTER — Encounter (HOSPITAL_COMMUNITY): Payer: Self-pay | Admitting: Physician Assistant

## 2024-10-13 ENCOUNTER — Other Ambulatory Visit (HOSPITAL_COMMUNITY): Payer: Self-pay

## 2024-10-13 VITALS — BP 112/68 | HR 67 | Ht 63.0 in | Wt 170.8 lb

## 2024-10-13 DIAGNOSIS — I2510 Atherosclerotic heart disease of native coronary artery without angina pectoris: Secondary | ICD-10-CM

## 2024-10-13 DIAGNOSIS — I480 Paroxysmal atrial fibrillation: Secondary | ICD-10-CM | POA: Diagnosis not present

## 2024-10-13 DIAGNOSIS — I4891 Unspecified atrial fibrillation: Secondary | ICD-10-CM

## 2024-10-13 DIAGNOSIS — D6869 Other thrombophilia: Secondary | ICD-10-CM | POA: Diagnosis not present

## 2024-10-13 DIAGNOSIS — I10 Essential (primary) hypertension: Secondary | ICD-10-CM | POA: Diagnosis not present

## 2024-10-13 NOTE — Progress Notes (Signed)
 Primary Care Physician: Cleotilde Perkins, DO Primary Cardiologist: Wilbert Bihari, MD Electrophysiologist: None  Referring Physician: ED   Meredith [name] is a 73 y.o. female with a history of paroxysmal AF (on Xarelto ), HTN, HLD, nonobstructive CAD, DM type II,  who presents for follow up in the [facility].  The patient was initially diagnosed with atrial fibrillation in 2016 after presenting to ED with symptoms of palpitations. Patient is on Xarelto  for stroke prevention. She is currently followed by Dr. Bihari for management of CAD and hypertension. She underwent a LHC in 2016 in the setting of abnormal stress test that revealed mild nonobstructive CAD.  She was seen in the ED on 08/31/2024 in the setting of shortness of breath and found to be in atrial fibrillation. She underwent a DCCV with restoration of sinus rhythm.  She was last seen on 09/03/2024 by Perkins Braver, NP and was maintaining sinus rhythm with frequent PACs and bigeminy.  She was continued on metoprolol  and Xarelto  at that time. She was seen in the ED 09/24/24 and was found to be hypotensive with rapid afib. She underwent another DCCV at that time. This was in the setting of hypokalemia.   She presented to the ED again on 09/29/2024 with complaint of atrial fibrillation.  Reported episodes began after PCP reduced her BP medication. During visit she was in sinus rhythm with no evidence of A-fib at that time.  She was discharged in stable condition and advised to follow-up in the AF clinic.   Patient presents today for follow up and reports no episodes of tachycardia or palpitations since her ED visit.  Her blood pressure today was stable at 112/68 and patient was in sinus rhythm with rate of 67 bpm.  She reports compliance with her current medications but does note some confusion and had multiple questions regarding changes in her recent therapy.  She was instructed to follow-up with her PCP and to contact our  office with correct medication dosages.  During today's visit we discussed treatment for managing A-fib moving forward such as medications and referral to EP to discuss ablation or implantable loop recorder. She has elected to continue her current therapy with rate control to reduce pill burden and further medication confusion.  She will contact our office if she elects to move forward with further interventions with medication or referral in the future.  Today, she denies symptoms of palpitations, chest pain, shortness of breath, orthopnea, PND, lower extremity edema, dizziness, presyncope, syncope, snoring, daytime somnolence, bleeding, or neurologic sequela. The patient is tolerating medications without difficulties and is otherwise without complaint today.    Atrial Fibrillation Risk Factors:  she does not have symptoms or diagnosis of sleep apnea. she does not have a history of rheumatic fever. she does not have a history of alcohol use. The patient does have a history of early familial atrial fibrillation or other arrhythmias.  Atrial Fibrillation Management history:  Previous antiarrhythmic drugs: None Previous cardioversions: 08/31/24, 09/24/24 Previous ablations: None Anticoagulation history: Xarelto   ROS- All systems are reviewed and negative except as per the HPI above.  Past Medical History:  Diagnosis Date   ANEMIA, IRON DEFICIENCY, UNSPEC. 02/05/2007   Qualifier: Diagnosis of  By: Cleatus MD, Arlyss     ASTHMA, INTERMITTENT 09/07/2010   Qualifier: Diagnosis of  By: Gwenyth MD, [place]     CAD (coronary artery disease), native coronary artery 02/16/2015   Cath with 20% LCx and RCA   Colon  polyps    CTS (carpal tunnel syndrome) 06/06/2016   Diabetes mellitus    Diverticulosis 05/17/12   DJD (degenerative joint disease) of lumbar spine    Enteritis    Flu vaccine need 09/19/2022   Gastric ulcer 04/2000   Hyperlipidemia    Hypertension    Irregular heart beat    Obesity     Osteoarthritis    Persistent atrial fibrillation (HCC)    CHADS2VASC score is 5 and on Xarelto    Pruritus 02/21/2021   Rash 02/15/2021   Renal cyst, left 05/17/12   Ventral hernia     Current Outpatient Medications  Medication Sig Dispense Refill   ACCU-CHEK AVIVA PLUS test strip USE AS INSTRUCTED TEST BLOOD GLUCOSE ONCE DAILY. ICD-10 CODE: E11.9 100 strip 12   Bempedoic Acid-Ezetimibe  (NEXLIZET ) 180-10 MG TABS Take 1 tablet by mouth daily. 90 tablet 3   canagliflozin  (INVOKANA ) 100 MG TABS tablet Take 1 tablet (100 mg total) by mouth daily before breakfast. 90 tablet 3   fexofenadine  (ALLEGRA ) 180 MG tablet Take 180 mg by mouth daily as needed. (Patient taking differently: Take 180 mg by mouth as needed.)     metoprolol  succinate (TOPROL -XL) 50 MG 24 hr tablet Take 50 mg by mouth daily.     olopatadine  (PATANOL) 0.1 % ophthalmic solution Place 1 drop into both eyes 2 (two) times daily.     rivaroxaban  (XARELTO ) 20 MG TABS tablet Take 1 tablet (20 mg total) by mouth daily. 90 tablet 0   rosuvastatin  (CRESTOR ) 40 MG tablet Take 1 tablet (40 mg total) by mouth daily. 90 tablet 0   valsartan  (DIOVAN ) 160 MG tablet Take 1 tablet (160 mg total) by mouth at bedtime. (Patient not taking: Reported on 10/13/2024) 90 tablet 3   No current facility-administered medications for this encounter.    Physical Exam: BP 112/68   Pulse 67   Ht 5' 3 (1.6 m)   Wt 77.5 kg   BMI 30.26 kg/m   GEN: Well nourished, well developed in no acute distress NECK: No JVD; No carotid bruits CARDIAC: Regular rate and rhythm, no murmurs, rubs, gallops RESPIRATORY:  Clear to auscultation without rales, wheezing or rhonchi  ABDOMEN: Soft, non-tender, non-distended EXTREMITIES:  No edema; No deformity   Wt Readings from Last 3 Encounters:  10/13/24 77.5 kg  09/29/24 81.6 kg  09/28/24 82.2 kg     EKG today demonstrates  Sinus rhythm with PACs and rate of 67 bpm with no acute changes consistent with previous  EKG.  Echo 09/26/2024 demonstrated  1. Left ventricular ejection fraction, by estimation, is 60 to 65%. The  left ventricle has normal function. The left ventricle has no regional  wall motion abnormalities. There is mild left ventricular hypertrophy.  Left ventricular diastolic parameters  were normal.   2. Right ventricular systolic function is normal. The right ventricular  size is normal. There is mildly elevated pulmonary artery systolic  pressure.   3. Left atrial size was mildly dilated.   4. The mitral valve is abnormal. No evidence of mitral valve  regurgitation. No evidence of mitral stenosis.   5. The aortic valve is tricuspid. There is mild calcification of the  aortic valve. Aortic valve regurgitation is trivial. Aortic valve  sclerosis is present, with no evidence of aortic valve stenosis.   6. The inferior vena cava is normal in size with greater than 50%  respiratory variability, suggesting right atrial pressure of 3 mmHg.    CHA2DS2-VASc Score = 4  The patient's score is based upon: CHF History: 0 HTN History: 1 Diabetes History: 1 Stroke History: 0 Vascular Disease History: 0 Age Score: 1 Gender Score: 1       ASSESSMENT AND PLAN: Paroxysmal Atrial Fibrillation (ICD10:  I48.0) The patient's CHA2DS2-VASc score is 4, indicating a 4.8% annual risk of stroke.   - Patient was in sinus rhythm maintaining stable heart rate with metoprolol  50 mg daily. - We discussed adding medical therapy such as amiodarone as well as referral to EP to discuss loop recorder as well as ablation. - She elects to continue rhythm control at this time and will contact our office if she is interested in moving forward with medication or referral in the future. -Creatinine clearance is 73 mL a minute - Continue Xarelto  20 mg daily  Essential hypertension: - Patient's blood pressure today was stable at 112/68 - Continue metoprolol  50 mg daily and Diovan  160 mg daily  Coronary artery  disease: - LHC 2016 due to abnormal stress test with mild nonobstructive CAD - Patient reports no chest pain or angina since prior follow-up.  Secondary Hypercoagulable State (ICD10:  D68.69) The patient is at significant risk for stroke/thromboembolism based upon her CHA2DS2-VASc Score of 4.  Continue Rivaroxaban  (Xarelto ).    Follow up in 3 months with A-fib clinic    [facility] [address] [place], [place] [address] [phone]

## 2024-10-15 ENCOUNTER — Other Ambulatory Visit: Payer: Self-pay | Admitting: *Deleted

## 2024-10-15 NOTE — Patient Outreach (Signed)
 Complex Care Management   Visit Note  10/15/2024  Name:  Meredith [name] MRN: [id number] DOB: 29-Dec-1950  Situation: Referral received for Complex Care Management related to  I obtained verbal consent from Patient.  Visit completed with Patient  on the phone  Background:  I outreached to Mrs. Meredith [name] today.  I was unable to complete outreach.  Mrs. Meredith [name] informs me that her phone was messing up and the phone died.  She reports that she is having trouble keeping a charge on her phone.  I call Mrs. Meredith [name] back and left a voice message to left her know that I will reschedule appointment.     Past Medical History:  Diagnosis Date   ANEMIA, IRON DEFICIENCY, UNSPEC. 02/05/2007   Qualifier: Diagnosis of  By: Cleatus MD, Arlyss     ASTHMA, INTERMITTENT 09/07/2010   Qualifier: Diagnosis of  By: Gwenyth MD, [place]     CAD (coronary artery disease), native coronary artery 02/16/2015   Cath with 20% LCx and RCA   Colon polyps    CTS (carpal tunnel syndrome) 06/06/2016   Diabetes mellitus    Diverticulosis 05/17/12   DJD (degenerative joint disease) of lumbar spine    Enteritis    Flu vaccine need 09/19/2022   Gastric ulcer 04/2000   Hyperlipidemia    Hypertension    Irregular heart beat    Obesity    Osteoarthritis    Persistent atrial fibrillation (HCC)    CHADS2VASC score is 5 and on Xarelto    Pruritus 02/21/2021   Rash 02/15/2021   Renal cyst, left 05/17/12   Ventral hernia     Assessment: Patient Reported Symptoms:  Cognitive        Neurological      HEENT        Cardiovascular      Respiratory      Endocrine      Gastrointestinal        Genitourinary      Integumentary      Musculoskeletal          Psychosocial       Quality of Family Relationships: helpful, involved, supportive Do you feel physically threatened by others?: No    10/15/2024    PHQ2-9 Depression Screening   Little interest or pleasure in doing things    Feeling down, depressed, or  hopeless    PHQ-2 - Total Score    Trouble falling or staying asleep, or sleeping too much    Feeling tired or having little energy    Poor appetite or overeating     Feeling bad about yourself - or that you are a failure or have let yourself or your family down    Trouble concentrating on things, such as reading the newspaper or watching television    Moving or speaking so slowly that other people could have noticed.  Or the opposite - being so fidgety or restless that you have been moving around a lot more than usual    Thoughts that you would be better off dead, or hurting yourself in some way    PHQ2-9 Total Score    If you checked off any problems, how difficult have these problems made it for you to do your work, take care of things at home, or get along with other people    Depression Interventions/Treatment      There were no vitals filed for this visit.  Medications Reviewed Today   Medications were not reviewed in  this encounter     Recommendation:     Follow Up Plan:    Keyuna Cuthrell, RN, BSN, ACM RN Care Manager [facility] [phone]

## 2024-10-15 NOTE — Patient Instructions (Signed)
 Meredith [name] - I am sorry I was unable to  complete outreach with you today for our scheduled appointment.  I have rescheduled your appointment for 11/08/24 @ 9:30 am.  I work with Cleotilde Perkins, DO and am calling to support your healthcare needs. Please contact me at [phone] at your earliest convenience. I look forward to speaking with you soon.   Thank you,  Kayona Foor, RN, BSN, ACM RN Care Manager [employer] [phone]

## 2024-10-16 ENCOUNTER — Encounter (HOSPITAL_COMMUNITY): Payer: Self-pay | Admitting: Pharmacy Technician

## 2024-10-16 ENCOUNTER — Emergency Department (HOSPITAL_COMMUNITY)
Admission: EM | Admit: 2024-10-16 | Discharge: 2024-10-16 | Disposition: A | Discharge status: Home / Self Care | Attending: Emergency Medicine | Admitting: Emergency Medicine

## 2024-10-16 ENCOUNTER — Other Ambulatory Visit: Payer: Self-pay

## 2024-10-16 DIAGNOSIS — I4891 Unspecified atrial fibrillation: Secondary | ICD-10-CM | POA: Diagnosis not present

## 2024-10-16 DIAGNOSIS — Z79899 Other long term (current) drug therapy: Secondary | ICD-10-CM | POA: Diagnosis not present

## 2024-10-16 DIAGNOSIS — N3001 Acute cystitis with hematuria: Secondary | ICD-10-CM | POA: Diagnosis not present

## 2024-10-16 DIAGNOSIS — J45909 Unspecified asthma, uncomplicated: Secondary | ICD-10-CM | POA: Insufficient documentation

## 2024-10-16 DIAGNOSIS — Z7901 Long term (current) use of anticoagulants: Secondary | ICD-10-CM | POA: Insufficient documentation

## 2024-10-16 DIAGNOSIS — E119 Type 2 diabetes mellitus without complications: Secondary | ICD-10-CM | POA: Diagnosis not present

## 2024-10-16 DIAGNOSIS — I10 Essential (primary) hypertension: Secondary | ICD-10-CM | POA: Insufficient documentation

## 2024-10-16 DIAGNOSIS — R309 Painful micturition, unspecified: Secondary | ICD-10-CM | POA: Diagnosis present

## 2024-10-16 DIAGNOSIS — I2510 Atherosclerotic heart disease of native coronary artery without angina pectoris: Secondary | ICD-10-CM | POA: Insufficient documentation

## 2024-10-16 LAB — URINALYSIS, ROUTINE W REFLEX MICROSCOPIC
Bilirubin Urine: NEGATIVE
Glucose, UA: >=500 mg/dL — AB
Ketones, ur: NEGATIVE mg/dL
Leukocytes,Ua: NEGATIVE
Nitrite: NEGATIVE
Protein, ur: 30 mg/dL — AB
RBC / HPF: >50 RBC/hpf (ref 0–5)
Specific Gravity, Urine: 1.022 (ref 1.005–1.030)
pH: 6 (ref 5.0–8.0)

## 2024-10-16 LAB — URINALYSIS, W/ REFLEX TO CULTURE (INFECTION SUSPECTED)
Bilirubin Urine: NEGATIVE
Glucose, UA: >=500 mg/dL — AB
Ketones, ur: 5 mg/dL — AB
Leukocytes,Ua: NEGATIVE
Nitrite: POSITIVE — AB
Protein, ur: 30 mg/dL — AB
RBC / HPF: >50 RBC/hpf (ref 0–5)
Specific Gravity, Urine: 1.026 (ref 1.005–1.030)
pH: 5 (ref 5.0–8.0)

## 2024-10-16 MED ORDER — CEPHALEXIN 500 MG PO CAPS: 500.0000 mg | ORAL_CAPSULE | Freq: Two times a day (BID) | ORAL | 0 refills | Status: DC

## 2024-10-16 NOTE — ED Provider Notes (Signed)
 [place] EMERGENCY DEPARTMENT AT [facility] Provider Note   CSN: [id number] Arrival date & time: 10/16/24  9173     Patient presents with: No chief complaint on file.   Meredith [name] is a 73 y.o. female.   Patient is a 73 year old who presents with blood when she wipes.  She is not sure if it is from her urine or vagina.  She had an episode 2 days ago and then this morning she had a second episode.  She denies any noticeable blood in her urine when she urinates.  She denies any blood in her stool.  She denies any abdominal pain.  She has a little bit of burning when she urinates.  No frequency.  No fevers.  No nausea or vomiting.  She does report some intermittent constipation and asked me advice on what she can take for this.  She has a history of atrial fibrillation and is on Xarelto  for this.       Prior to Admission medications   Medication Sig Start Date End Date Taking? Authorizing Provider  cephALEXin  (KEFLEX ) 500 MG capsule Take 1 capsule (500 mg total) by mouth 2 (two) times daily. 10/16/24  Yes Lenor Hollering, MD  ACCU-CHEK AVIVA PLUS test strip USE AS INSTRUCTED TEST BLOOD GLUCOSE ONCE DAILY. ICD-10 CODE: E11.9 09/28/24   Cleotilde Perkins, DO  Bempedoic Acid-Ezetimibe  (NEXLIZET ) 180-10 MG TABS Take 1 tablet by mouth daily. 09/28/24   Cleotilde Perkins, DO  canagliflozin  (INVOKANA ) 100 MG TABS tablet Take 1 tablet (100 mg total) by mouth daily before breakfast. 09/28/24   Cleotilde Perkins, DO  fexofenadine  (ALLEGRA ) 180 MG tablet Take 180 mg by mouth daily as needed. Patient taking differently: Take 180 mg by mouth as needed. 09/28/24   [provider]  metoprolol  succinate (TOPROL -XL) 50 MG 24 hr tablet Take 50 mg by mouth daily. 09/30/24   [provider]  olopatadine  (PATANOL) 0.1 % ophthalmic solution Place 1 drop into both eyes 2 (two) times daily. 09/28/24   [provider]  rivaroxaban  (XARELTO ) 20 MG TABS tablet Take 1 tablet (20 mg  total) by mouth daily. 09/28/24   Cleotilde Perkins, DO  rosuvastatin  (CRESTOR ) 40 MG tablet Take 1 tablet (40 mg total) by mouth daily. 09/28/24   Cleotilde Perkins, DO  valsartan  (DIOVAN ) 160 MG tablet Take 1 tablet (160 mg total) by mouth at bedtime. Patient not taking: Reported on 10/13/2024 09/28/24   Cleotilde Perkins, DO    Allergies: Lisinopril , Doxycycline , Flexeril [cyclobenzaprine hcl], Lipitor [atorvastatin calcium ], and Metaxalone    Review of Systems  Constitutional:  Negative for chills, diaphoresis, fatigue and fever.  HENT:  Negative for congestion, rhinorrhea and sneezing.   Eyes: Negative.   Respiratory:  Negative for cough, chest tightness and shortness of breath.   Cardiovascular:  Negative for chest pain and leg swelling.  Gastrointestinal:  Positive for constipation. Negative for abdominal pain, blood in stool, diarrhea, nausea and vomiting.  Genitourinary:  Positive for dysuria. Negative for difficulty urinating, flank pain, frequency and hematuria.  Musculoskeletal:  Negative for arthralgias and back pain.  Skin:  Negative for rash.  Neurological:  Negative for dizziness, speech difficulty, weakness, numbness and headaches.    Updated Vital Signs BP 128/75   Pulse 78   Temp 98 F (36.7 C) (Oral)   Resp 18   SpO2 100%   Physical Exam Constitutional:      Appearance: She is well-developed.  HENT:     Head: Normocephalic and  atraumatic.  Eyes:     Pupils: Pupils are equal, round, and reactive to light.  Cardiovascular:     Rate and Rhythm: Normal rate and regular rhythm.     Heart sounds: Normal heart sounds.  Pulmonary:     Effort: Pulmonary effort is normal. No respiratory distress.     Breath sounds: Normal breath sounds. No wheezing or rales.  Chest:     Chest wall: No tenderness.  Abdominal:     General: Bowel sounds are normal.     Palpations: Abdomen is soft.     Tenderness: There is no abdominal tenderness. There is no guarding or rebound.   Genitourinary:    Comments: No bleeding noted in the vaginal vault Musculoskeletal:        General: Normal range of motion.     Cervical back: Normal range of motion and neck supple.  Lymphadenopathy:     Cervical: No cervical adenopathy.  Skin:    General: Skin is warm and dry.     Findings: No rash.  Neurological:     Mental Status: She is alert and oriented to person, place, and time.     (all labs ordered are listed, but only abnormal results are displayed) Labs Reviewed  URINALYSIS, ROUTINE W REFLEX MICROSCOPIC - Abnormal; Notable for the following components:      Result Value   APPearance HAZY (*)    Glucose, UA >=500 (*)    Hgb urine dipstick LARGE (*)    Protein, ur 30 (*)    Bacteria, UA MANY (*)    All other components within normal limits  URINALYSIS, W/ REFLEX TO CULTURE (INFECTION SUSPECTED) - Abnormal; Notable for the following components:   APPearance HAZY (*)    Glucose, UA >=500 (*)    Hgb urine dipstick LARGE (*)    Ketones, ur 5 (*)    Protein, ur 30 (*)    Nitrite POSITIVE (*)    Bacteria, UA MANY (*)    All other components within normal limits  URINE CULTURE    EKG: None  Radiology: No results found.   Procedures   Medications Ordered in the ED - No data to display                                  Medical Decision Making Amount and/or Complexity of Data Reviewed Labs: ordered.  Risk Prescription drug management.   This patient presents to the ED for concern of hematuria versus vaginal bleeding, this involves an extensive number of treatment options, and is a complaint that carries with it a high risk of complications and morbidity.  I considered the following differential and admission for this acute, potentially life threatening condition.  The differential diagnosis includes vaginal tear, uterine mass, UTI, kidney stone, renal mass  MDM:    Patient is a 73 year old who presents with blood when she wipes after she urinates.  She  does not report any blood in her stool or when she has a bowel movement.  Pelvic exam does not reveal any vaginal source of the bleeding.  In-N-Out cath was performed which does show hematuria with concerns for infection.  I suspect the infection is likely the etiology of her hematuria.  She does not have any abdominal pain or flank pain that would be more concerning for renal colic.  Will treat her with antibiotics.  Advised her to have close follow-up with her  primary care doctor to ensure improvement and resolution of the hematuria.  Return precautions were given.  (Labs, imaging, consults)  Labs: I Ordered, and personally interpreted labs.  The pertinent results include: UTI, hematuria  Imaging Studies ordered: I ordered imaging studies including none I independently visualized and interpreted imaging. I agree with the radiologist interpretation  Additional history obtained from sister at bedside.  External records from outside source obtained and reviewed including history  Cardiac Monitoring: The patient was not maintained on a cardiac monitor.  If on the cardiac monitor, I personally viewed and interpreted the cardiac monitored which showed an underlying rhythm of:    Reevaluation: After the interventions noted above, I reevaluated the patient and found that they have :improved  Social Determinants of Health:    Disposition: Discharged to home  Co morbidities that complicate the patient evaluation  Past Medical History:  Diagnosis Date   ANEMIA, IRON DEFICIENCY, UNSPEC. 02/05/2007   Qualifier: Diagnosis of  By: Cleatus MD, Arlyss     ASTHMA, INTERMITTENT 09/07/2010   Qualifier: Diagnosis of  By: Gwenyth MD, Erik     CAD (coronary artery disease), native coronary artery 02/16/2015   Cath with 20% LCx and RCA   Colon polyps    CTS (carpal tunnel syndrome) 06/06/2016   Diabetes mellitus    Diverticulosis 05/17/12   DJD (degenerative joint disease) of lumbar spine    Enteritis     Flu vaccine need 09/19/2022   Gastric ulcer 04/2000   Hyperlipidemia    Hypertension    Irregular heart beat    Obesity    Osteoarthritis    Persistent atrial fibrillation (HCC)    CHADS2VASC score is 5 and on Xarelto    Pruritus 02/21/2021   Rash 02/15/2021   Renal cyst, left 05/17/12   Ventral hernia      Medicines Meds ordered this encounter  Medications   cephALEXin  (KEFLEX ) 500 MG capsule    Sig: Take 1 capsule (500 mg total) by mouth 2 (two) times daily.    Dispense:  14 capsule    Refill:  0    I have reviewed the patients home medicines and have made adjustments as needed  Problem List / ED Course: Problem List Items Addressed This Visit   None Visit Diagnoses       Acute cystitis with hematuria    -  Primary                Final diagnoses:  Acute cystitis with hematuria    ED Discharge Orders          Ordered    cephALEXin  (KEFLEX ) 500 MG capsule  2 times daily        10/16/24 1345               Lenor Hollering, MD 10/16/24 1349

## 2024-10-16 NOTE — ED Triage Notes (Signed)
 Pt here POV with reports of noticing blood on the toilet paper after wiping when she urinated. Initially started on Thursday. Pt on xarelto .

## 2024-10-16 NOTE — Discharge Instructions (Addendum)
 Start taking the antibiotics today.  Take them twice a day for 7 days.  Call on Monday to make an appointment to have follow-up with your primary care doctor.  For constipation, I would start taking a fiber supplement daily such as Metamucil.  You can also use MiraLAX  to help stimulate a bowel movement.  Return to the emergency room if you have any worsening symptoms.

## 2024-10-18 LAB — URINE CULTURE: Culture: >=100000 — AB

## 2024-10-19 ENCOUNTER — Telehealth (HOSPITAL_BASED_OUTPATIENT_CLINIC_OR_DEPARTMENT_OTHER): Payer: Self-pay | Admitting: *Deleted

## 2024-10-19 NOTE — Telephone Encounter (Signed)
 Post ED Visit - Positive Culture Follow-up  Culture report reviewed by antimicrobial stewardship pharmacist: [facility] [x]  Maurilio Patten, Pharm.D. []  Venetia Gully, Pharm.D., BCPS AQ-ID []  Garrel Crews, Pharm.D., BCPS []  Almarie Lunger, [address].D., BCPS []  Pulcifer, [address].D., BCPS, AAHIVP []  Rosaline Bihari, Pharm.D., BCPS, AAHIVP []  Vernell Meier, PharmD, BCPS []  Latanya Hint, PharmD, BCPS []  Donald Medley, PharmD, BCPS []  Rocky Bold, PharmD []  Dorothyann Alert, PharmD, BCPS []  Morene Babe, PharmD  [facility] []  Rosaline Edison, PharmD []  Romona Bliss, PharmD []  Dolphus Roller, PharmD []  Veva Seip, Rph []  Vernell Daunt) Leonce, PharmD []  Eva Allis, PharmD []  Rosaline Millet, PharmD []  Iantha Batch, PharmD []  Arvin Gauss, PharmD []  Wanda Hasting, PharmD []  Ronal Rav, PharmD []  Rocky Slade, PharmD []  Bard Jeans, PharmD   Positive urine culture Treated with cephalexin , organism sensitive to the same and no further patient follow-up is required at this time.  Lorita Barnie Pereyra 10/19/2024, 10:09 AM

## 2024-10-21 ENCOUNTER — Other Ambulatory Visit: Payer: Self-pay

## 2024-10-21 NOTE — Patient Instructions (Signed)
 [name] Meredith Burr - I am sorry I was unable to reach you today for our scheduled appointment. I work with Cleotilde Perkins, DO and am calling to support your healthcare needs. Please contact me at [phone] at your earliest convenience. I look forward to speaking with you soon.   Thank you,   Laymon Doll, BSW [place]/VBCI - [facility] Social Worker [phone]

## 2024-10-25 ENCOUNTER — Telehealth: Payer: Self-pay

## 2024-10-25 NOTE — Telephone Encounter (Signed)
 Patient Meredith [name] on nurse line requesting that Dr. Cleotilde send her in a medication for constipation.   She reports that she has not had a bowel movement in two weeks. She denies abdominal pain, vomiting, abdominal bloating or distention. She states that she is passing gas and tolerating fluids.   States that abdomen is soft.   She has used OTC remedies, however, is not able to get much more that a few very small bowel movements.   Scheduled patient appointment for tomorrow morning with Dr. Rosendo.   ED precautions discussed.   Chiquita JAYSON English, RN

## 2024-10-26 ENCOUNTER — Ambulatory Visit (INDEPENDENT_AMBULATORY_CARE_PROVIDER_SITE_OTHER): Admitting: Student

## 2024-10-26 VITALS — BP 136/62 | HR 76 | Ht 63.0 in | Wt 171.2 lb

## 2024-10-26 DIAGNOSIS — K5900 Constipation, unspecified: Secondary | ICD-10-CM | POA: Diagnosis not present

## 2024-10-26 MED ORDER — SENNA 8.6 MG PO TABS: 1.0000 | ORAL_TABLET | Freq: Every day | ORAL | 0 refills | Status: DC

## 2024-10-26 MED ORDER — POLYETHYLENE GLYCOL 3350 17 GM/SCOOP PO POWD: 17.0000 g | Freq: Two times a day (BID) | ORAL | 1 refills | Status: DC | PRN

## 2024-10-26 NOTE — Patient Instructions (Signed)
 Pleasure to meet you today.  For your constipation I have sent in prescription of MiraLAX  which you will take 2 times daily until you start having bowel movements then you can switch to once daily or as needed.  I have also sent in prescription for senna which you take once daily to help with your bowel movements.  You are currently due for colonoscopy I have sent in referral to gastroenterologist to schedule your colonoscopy.  They will call you to schedule an appointment for your colonoscopy.

## 2024-10-26 NOTE — Progress Notes (Signed)
    SUBJECTIVE:   CHIEF COMPLAINT / HPI:   Meredith [name] is a 73 year old female who presents with constipation.  She has not had a bowel movement for an unspecified duration and cannot recall the last occurrence. She attempted to relieve the constipation with a rectal enema, resulting in passing a small amount of stool. Subsequent attempts with a pill and a Fleets enema were unsuccessful. She is able to pass gas, with the last occurrence being five minutes prior to the visit. There is no abdominal pain, nausea, vomiting, or blood in her stool. Her past medical history includes a urinary tract infection with hematuria, which was treated and resolved. She recalls having hemorrhoids a long time ago but denies any recent issues with them. She has a history of constipation, although she initially stated that she used to be regular and this issue is new for her.  PERTINENT  PMH / PSH: Reviewed  OBJECTIVE:   BP 136/62   Pulse 76   Ht 5' 3 (1.6 m)   Wt 171 lb 3.2 oz (77.7 kg)   SpO2 97%   BMI 30.33 kg/m    Physical Exam General: Alert, well appearing, NAD Cardiovascular: RRR, No Murmurs, Normal S2/S2 Respiratory: CTAB, No wheezing or Rales Abdomen: Normal bowel sound, no distention, no tenderness or guarding Skin: Warm and dry  ASSESSMENT/PLAN:   No problem-specific Assessment & Plan notes found for this encounter.   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 No relief from rectal enema. Normal electrolytes and thyroid  function a month ago. Diagnosis of functional constipation based on bowel habits. - Prescribed Miralax  powder twice daily in water. - Prescribed Senna once daily. - Advised to reduce Miralax  to once daily and discontinue Senna once regular bowel movements resume. - Encourage adequate hydration with water - Instructed to return for evaluation if no bowel movement in the next few days - Placed GI referral for  colonoscopy  Norleen April, MD [facility]

## 2024-10-28 ENCOUNTER — Other Ambulatory Visit: Payer: Self-pay

## 2024-10-28 NOTE — Patient Instructions (Signed)
 Visit Information  Thank you for taking time to visit with me today. Please don't hesitate to contact me if I can be of assistance to you before our next scheduled appointment.  Your next care management appointment is no further scheduled appointments.   Patient stated she was ok and did not have a need for further follow ups. Patient was provided with BSW phone number should her needs change. RNCM Nichole will also be notified.   Please call the care guide team at [phone] if you need to cancel, schedule, or reschedule an appointment.   Please call the Suicide and Crisis Lifeline: 988 go to [facility] [address], [place] [phone]) call 911 if you are experiencing a Mental Health or Behavioral Health Crisis or need someone to talk to.  Laymon Doll, BSW [place]/VBCI - [employer] Social Worker [phone]

## 2024-10-28 NOTE — Patient Outreach (Signed)
 Social Drivers of Health  Community Resource and Care Coordination Visit Note   10/28/2024  Name: Meredith [name] MRN: [id number] DOB:1951-11-20  Situation: Referral received for [facility] needs assessment and assistance related to Food Insecurity . I obtained verbal consent from Patient.  Visit completed with Patient on the phone.   Background:   SDOH Interventions Today    Flowsheet Row Most Recent Value  SDOH Interventions   Food Insecurity Interventions Community Resources Provided  Housing Interventions Community Resources Provided  [provided patient with contact info to [facility] for home repair with mold situaiton.]  Transportation Interventions Patient Resources (Friends/Family)  Utilities Interventions Intervention Not Indicated     Assessment:   Goals Addressed             This Visit's Progress    COMPLETED: BSW Goal   On track    Current SDOH Barriers:  Housing barriers Food insecurity SSDI application assistance  Interventions: Patient interviewed and appropriate screenings performed Referred patient to community resources  Provided patient with information about out of the garden food project distribution schedules, food banks, and [employer] (provided phone #). Provided patient with [facility] phone number and encouraged her to call to set up appt for SSDI application assistance.  Provided pt with [facility] phone number for possible home repair and provided brochure via mail.  10/28/2024 Patient stated she does not have any needs at this time and is ok. BSW will remove himself from care team and notify [facility]. Pt was provided with BSW direct # should needs change.         Recommendation:   attend all scheduled provider appointments ask for help if you don't understand your health insurance benefits  Follow Up Plan:   Patient declines further calls or assistance. [employer] will be closed. Patient has been provided contact  information should new needs arise.   Laymon Doll, BSW [place]/VBCI - [employer] Social Worker [phone]

## 2024-10-30 ENCOUNTER — Ambulatory Visit (INDEPENDENT_AMBULATORY_CARE_PROVIDER_SITE_OTHER)

## 2024-10-30 ENCOUNTER — Encounter (HOSPITAL_COMMUNITY): Payer: Self-pay | Admitting: Emergency Medicine

## 2024-10-30 ENCOUNTER — Ambulatory Visit (HOSPITAL_COMMUNITY)
Admission: EM | Admit: 2024-10-30 | Discharge: 2024-10-30 | Disposition: A | Discharge status: Home / Self Care | Attending: Nurse Practitioner | Admitting: Nurse Practitioner

## 2024-10-30 DIAGNOSIS — K5900 Constipation, unspecified: Secondary | ICD-10-CM

## 2024-10-30 MED ORDER — SENNOSIDES-DOCUSATE SODIUM 8.6-50 MG PO TABS: 1.0000 | ORAL_TABLET | Freq: Two times a day (BID) | ORAL | 0 refills | Status: AC

## 2024-10-30 NOTE — ED Provider Notes (Signed)
 [facility]    CSN: [id number] Arrival date & time: 10/30/24  1438      History   Chief Complaint Chief Complaint  Patient presents with   Constipation    HPI Meredith [name] is a 73 y.o. female.   Discussed the use of AI scribe software for clinical note transcription with the patient, who gave verbal consent to proceed.   The patient presents with ongoing constipation. She was evaluated by her family doctor on November 18th after home treatments provided only minimal relief. She reports passing gas but has not had a satisfactory bowel movement. Her appetite is fair, and she is drinking fluids well. She denies abdominal pain, nausea, vomiting, or fevers. Her family doctor prescribed MiraLAX  twice daily along with daily senna, with instructions to decrease MiraLAX  to once daily or as needed once bowel movements become regular. A referral to gastroenterology was also placed, as she is overdue for a colonoscopy. The patient states that her MiraLAX  came in a box of 20 packets but she has only used 3 packets so far, and she has not taken any of the senna.  The following sections of the patient's history were reviewed and updated as appropriate: allergies, current medications, past family history, past medical history, past social history, past surgical history, and problem list.     Past Medical History:  Diagnosis Date   ANEMIA, IRON DEFICIENCY, UNSPEC. 02/05/2007   Qualifier: Diagnosis of  By: Cleatus MD, Arlyss     ASTHMA, INTERMITTENT 09/07/2010   Qualifier: Diagnosis of  By: Gwenyth MD, [place]     CAD (coronary artery disease), native coronary artery 02/16/2015   Cath with 20% LCx and RCA   Colon polyps    CTS (carpal tunnel syndrome) 06/06/2016   Diabetes mellitus    Diverticulosis 05/17/12   DJD (degenerative joint disease) of lumbar spine    Enteritis    Flu vaccine need 09/19/2022   Gastric ulcer 04/2000   Hyperlipidemia    Hypertension    Irregular heart beat     Obesity    Osteoarthritis    Persistent atrial fibrillation (HCC)    CHADS2VASC score is 5 and on Xarelto    Pruritus 02/21/2021   Rash 02/15/2021   Renal cyst, left 05/17/12   Ventral hernia     Patient Active Problem List   Diagnosis Date Noted   Hypomagnesemia 09/25/2024   Atrial fibrillation (HCC) 09/25/2024   Hypotension 09/25/2024   Unintentional weight loss 09/01/2024   Chronic kidney disease (CKD) 01/23/2024   Trigger finger of left hand 10/31/2023   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   Torticollis, acute    Ingrown nail 08/16/2021   Tear of rotator cuff 05/04/2021   Hypokalemia 12/04/2018   Osteopenia 09/15/2017   Pain of both hip joints 06/13/2017   Carpal tunnel syndrome 06/06/2016   Estrogen deficiency 03/29/2016   CAD (coronary artery disease), native coronary artery 10/26/2015   Persistent atrial fibrillation (HCC) 02/23/2015   PAC (premature atrial contraction) 01/19/2015   HERNIA, VENTRAL 04/18/2010   DEGENERATIVE DISC DISEASE, LUMBAR SPINE 04/18/2010   Diabetes mellitus type II, controlled, with no complications (HCC) 02/05/2007   HYPERCHOLESTEROLEMIA 02/05/2007   Obesity 02/05/2007   DEPRESSION, MAJOR, RECURRENT 02/05/2007   HYPERTENSION, BENIGN SYSTEMIC 02/05/2007   GASTROESOPHAGEAL REFLUX, NO ESOPHAGITIS 02/05/2007   OSTEOARTHRITIS, MULTI SITES 02/05/2007    Past Surgical History:  Procedure Laterality Date   ABDOMINAL HYSTERECTOMY     LEFT HEART CATHETERIZATION WITH CORONARY ANGIOGRAM N/A 02/16/2015  Procedure: LEFT HEART CATHETERIZATION WITH CORONARY ANGIOGRAM;  Surgeon: Lonni JONETTA Cash, MD;  Location: [facility] CATH LAB;  Service: Cardiovascular;  Laterality: N/A;   OPERATIVE HYSTEROSCOPY     TUBAL LIGATION     UTERINE FIBROID SURGERY      OB History   No obstetric history on file.      Home Medications    Prior to Admission medications   Medication Sig Start Date End Date Taking? Authorizing Provider  senna-docusate (SENOKOT-S) 8.6-50 MG  tablet Take 1 tablet by mouth 2 (two) times daily for 3 days. Take with Miralax  at 10 AM & 4 PM 10/30/24 11/02/24 Yes Iola Lukes, FNP  ACCU-CHEK AVIVA PLUS test strip USE AS INSTRUCTED TEST BLOOD GLUCOSE ONCE DAILY. ICD-10 CODE: E11.9 09/28/24   Cleotilde Perkins, DO  Bempedoic Acid -Ezetimibe  (NEXLIZET ) 180-10 MG TABS Take 1 tablet by mouth daily. 09/28/24   Cleotilde Perkins, DO  canagliflozin  (INVOKANA ) 100 MG TABS tablet Take 1 tablet (100 mg total) by mouth daily before breakfast. 09/28/24   Cleotilde Perkins, DO  cephALEXin  (KEFLEX ) 500 MG capsule Take 1 capsule (500 mg total) by mouth 2 (two) times daily. 10/16/24   Lenor Hollering, MD  fexofenadine  (ALLEGRA ) 180 MG tablet Take 180 mg by mouth daily as needed. Patient taking differently: Take 180 mg by mouth as needed. 09/28/24   [provider]  metoprolol  succinate (TOPROL -XL) 50 MG 24 hr tablet Take 50 mg by mouth daily. 09/30/24   [provider]  olopatadine  (PATANOL) 0.1 % ophthalmic solution Place 1 drop into both eyes 2 (two) times daily. 09/28/24   [provider]  polyethylene glycol powder (GLYCOLAX /MIRALAX ) 17 GM/SCOOP powder Take 17 g by mouth 2 (two) times daily as needed. Dissolve 1 capful (17g) in 4-8 ounces of liquid and take by mouth daily. 10/26/24   Rosendo Rush, MD  rivaroxaban  (XARELTO ) 20 MG TABS tablet Take 1 tablet (20 mg total) by mouth daily. 09/28/24   Cleotilde Perkins, DO  rosuvastatin  (CRESTOR ) 40 MG tablet Take 1 tablet (40 mg total) by mouth daily. 09/28/24   Cleotilde Perkins, DO  valsartan  (DIOVAN ) 160 MG tablet Take 1 tablet (160 mg total) by mouth at bedtime. Patient not taking: Reported on 10/13/2024 09/28/24   Cleotilde Perkins, DO    Family History Family History  Problem Relation Age of Onset   Diabetes Mother    Kidney disease Mother    Heart disease Mother    Lung cancer Father    Heart disease Father    Stomach cancer Sister    Clotting disorder Other        neice   Diabetes Sister     Diabetes Sister    Diabetes Sister    Diabetes Sister    Colon cancer Neg Hx     Social History Social History   Tobacco Use   Smoking status: Former    Current packs/day: 0.00    Average packs/day: 1 pack/day for 5.0 years (5.0 ttl pk-yrs)    Types: Cigarettes    Start date: 12/09/1973    Quit date: 12/09/1978    Years since quitting: 45.9   Smokeless tobacco: Never  Vaping Use   Vaping status: Never Used  Substance Use Topics   Alcohol use: No   Drug use: No     Allergies   Lisinopril , Doxycycline , Flexeril [cyclobenzaprine hcl], Lipitor [atorvastatin calcium ], and Metaxalone   Review of Systems Review of Systems  Constitutional:  Negative for appetite change and fever.  Gastrointestinal:  Positive for constipation. Negative for abdominal pain, nausea, rectal pain and vomiting.  All other systems reviewed and are negative.    Physical Exam Triage Vital Signs ED Triage Vitals [10/30/24 1631]  Encounter Vitals Group     BP      Girls Systolic BP Percentile      Girls Diastolic BP Percentile      Boys Systolic BP Percentile      Boys Diastolic BP Percentile      Pulse      Resp      Temp      Temp src      SpO2      Weight      Height      Head Circumference      Peak Flow      Pain Score 0     Pain Loc      Pain Education      Exclude from Growth Chart    No data found.  Updated Vital Signs There were no vitals taken for this visit.  Visual Acuity Right Eye Distance:   Left Eye Distance:   Bilateral Distance:    Right Eye Near:   Left Eye Near:    Bilateral Near:     Physical Exam Vitals reviewed.  Constitutional:      General: She is awake. She is not in acute distress.    Appearance: Normal appearance. She is well-developed. She is not ill-appearing, toxic-appearing or diaphoretic.  HENT:     Head: Normocephalic.     Right Ear: Hearing normal.     Left Ear: Hearing normal.     Nose: Nose normal.     Mouth/Throat:     Mouth: Mucous  membranes are moist.  Eyes:     General: Vision grossly intact.     Conjunctiva/sclera: Conjunctivae normal.  Cardiovascular:     Rate and Rhythm: Normal rate and regular rhythm.     Heart sounds: Normal heart sounds.  Pulmonary:     Effort: Pulmonary effort is normal.     Breath sounds: Normal breath sounds and air entry.  Abdominal:     General: Bowel sounds are normal. There is no distension.     Palpations: Abdomen is soft.     Tenderness: There is no abdominal tenderness.  Musculoskeletal:        General: Normal range of motion.     Cervical back: Normal range of motion and neck supple.  Skin:    General: Skin is warm and dry.  Neurological:     General: No focal deficit present.     Mental Status: She is alert and oriented to person, place, and time.  Psychiatric:        Speech: Speech normal.        Behavior: Behavior is cooperative.      UC Treatments / Results  Labs (all labs ordered are listed, but only abnormal results are displayed) Labs Reviewed - No data to display  EKG   Radiology DG Abd 1 View Result Date: 10/30/2024 CLINICAL DATA:  Constipation. EXAM: ABDOMEN - 1 VIEW COMPARISON:  Abdominal radiograph dated 08/09/2017 FINDINGS: Large amount of stool throughout the colon. No bowel dilatation or evidence of obstruction. No free air or radiopaque calculi. No acute osseous pathology. IMPRESSION: Constipation. No bowel obstruction. Electronically Signed   By: Vanetta Chou M.D.   On: 10/30/2024 16:59    Procedures Procedures (including critical care time)  Medications Ordered in UC Medications -  No data to display  Initial Impression / Assessment and Plan / UC Course  I have reviewed the triage vital signs and the nursing notes.  Pertinent labs & imaging results that were available during my care of the patient were reviewed by me and considered in my medical decision making (see chart for details).    The patient presents with persistent  constipation despite prior treatment initiated by her family doctor. Although she was prescribed MiraLAX  twice daily and senna daily, she reports using only three packets of MiraLAX  and has not taken any senna. She continues to pass gas but has not had adequate bowel movements. Abdominal X-ray obtained today shows a large amount of stool throughout the colon without bowel dilation, obstruction, or free air. Findings are consistent with significant constipation without evidence of a more serious abdominal process. A bowel clean-out regimen was prescribed consisting of MiraLAX  17 grams (two packets) mixed in six ounces of water or clear liquid twice daily at 10 a.m. and 4 p.m. for three days, along with Senokot-S twice daily for the same period. She was instructed to continue her usual diabetes and other medications and to follow a clear liquid and bland diet during this regimen. Follow-up with her primary care provider is recommended for a repeat abdominal X-ray to confirm improvement. Extensive verbal and written education was provided to the patient and her sister regarding correct MiraLAX  preparation and dosing. She was advised to seek emergency care for severe abdominal pain, worsening bloating, vomiting, fever, or inability to pass gas.  Today's evaluation has revealed no signs of a dangerous process. Discussed diagnosis with patient and/or guardian. Patient and/or guardian aware of their diagnosis, possible red flag symptoms to watch out for and need for close follow up. Patient and/or guardian understands verbal and written discharge instructions. Patient and/or guardian comfortable with plan and disposition.  Patient and/or guardian has a clear mental status at this time, good insight into illness (after discussion and teaching) and has clear judgment to make decisions regarding their care  Documentation was completed with the aid of voice recognition software. Transcription may contain typographical  errors.   Final Clinical Impressions(s) / UC Diagnoses   Final diagnoses:  Constipation, unspecified constipation type     Discharge Instructions      You were seen today for constipation. An X-ray showed a lot of stool in your colon, but nothing else to worry about.  I have given you medicine to help with the constipation and make it easier for you to go to the bathroom. It is very important to take your medicine exactly as I told you.  Use the Miralax  packets you already have. Mix 2 packets of Miralax  in 6 ounces of water or any clear drink, and drink it. Do this twice a day, at 10 AM and 4 PM, for 3 days (Sunday, Monday, Tuesday).  You have also been given a pill to take twice a day for 3 days along with the Miralax .  While doing this, drink plenty of clear liquids and eat a bland diet. Bland foods include bananas, plain rice, applesauce, and dry toast. Avoid dairy, thick fruit juices, juices with pulp, and fried or greasy foods until your bowel movements return to normal. Once you have normal bowel movements, make sure to drink plenty of fluids and eat foods with fiber to keep your bowels working well.  If you have severe stomach pain, bloating, vomiting, fever, or any other worries, go to the emergency room.  Call on Monday to make an appointment with your primary care provider after you finish this medicine.     ED Prescriptions     Medication Sig Dispense Auth. Provider   senna-docusate (SENOKOT-S) 8.6-50 MG tablet Take 1 tablet by mouth 2 (two) times daily for 3 days. Take with Miralax  at 10 AM & 4 PM 6 tablet Iola Lukes, FNP      PDMP not reviewed this encounter.   Iola Lukes, FNP 10/30/24 1800

## 2024-10-30 NOTE — Discharge Instructions (Addendum)
 You were seen today for constipation. An X-ray showed a lot of stool in your colon, but nothing else to worry about.  I have given you medicine to help with the constipation and make it easier for you to go to the bathroom. It is very important to take your medicine exactly as I told you.  Use the Miralax  packets you already have. Mix 2 packets of Miralax  in 6 ounces of water or any clear drink, and drink it. Do this twice a day, at 10 AM and 4 PM, for 3 days (Sunday, Monday, Tuesday).  You have also been given a pill to take twice a day for 3 days along with the Miralax .  While doing this, drink plenty of clear liquids and eat a bland diet. Bland foods include bananas, plain rice, applesauce, and dry toast. Avoid dairy, thick fruit juices, juices with pulp, and fried or greasy foods until your bowel movements return to normal. Once you have normal bowel movements, make sure to drink plenty of fluids and eat foods with fiber to keep your bowels working well.  If you have severe stomach pain, bloating, vomiting, fever, or any other worries, go to the emergency room.  Call on Monday to make an appointment with your primary care provider after you finish this medicine.

## 2024-10-30 NOTE — ED Triage Notes (Signed)
 Pt reports she had constipation for 2 weeks. Reports that had an enema that didn't help. Then saw doctor who told her to take Miralax  and Senna and that hasn't helped.

## 2024-11-08 ENCOUNTER — Telehealth: Payer: Self-pay | Admitting: *Deleted

## 2024-11-10 ENCOUNTER — Encounter: Payer: Self-pay | Admitting: Family Medicine

## 2024-11-10 ENCOUNTER — Ambulatory Visit: Admitting: Family Medicine

## 2024-11-10 VITALS — BP 122/70 | HR 67 | Temp 97.9°F | Ht 63.0 in | Wt 173.0 lb

## 2024-11-10 DIAGNOSIS — Z1211 Encounter for screening for malignant neoplasm of colon: Secondary | ICD-10-CM

## 2024-11-10 DIAGNOSIS — K219 Gastro-esophageal reflux disease without esophagitis: Secondary | ICD-10-CM

## 2024-11-10 DIAGNOSIS — I2510 Atherosclerotic heart disease of native coronary artery without angina pectoris: Secondary | ICD-10-CM

## 2024-11-10 DIAGNOSIS — N1831 Chronic kidney disease, stage 3a: Secondary | ICD-10-CM

## 2024-11-10 DIAGNOSIS — Z0000 Encounter for general adult medical examination without abnormal findings: Secondary | ICD-10-CM | POA: Insufficient documentation

## 2024-11-10 DIAGNOSIS — E7800 Pure hypercholesterolemia, unspecified: Secondary | ICD-10-CM

## 2024-11-10 DIAGNOSIS — I10 Essential (primary) hypertension: Secondary | ICD-10-CM

## 2024-11-10 DIAGNOSIS — E119 Type 2 diabetes mellitus without complications: Secondary | ICD-10-CM | POA: Insufficient documentation

## 2024-11-10 DIAGNOSIS — Z1321 Encounter for screening for nutritional disorder: Secondary | ICD-10-CM

## 2024-11-10 DIAGNOSIS — M8580 Other specified disorders of bone density and structure, unspecified site: Secondary | ICD-10-CM

## 2024-11-10 DIAGNOSIS — I4819 Other persistent atrial fibrillation: Secondary | ICD-10-CM

## 2024-11-10 DIAGNOSIS — F330 Major depressive disorder, recurrent, mild: Secondary | ICD-10-CM

## 2024-11-10 DIAGNOSIS — Z13820 Encounter for screening for osteoporosis: Secondary | ICD-10-CM

## 2024-11-10 NOTE — Progress Notes (Signed)
 New Patient Office Visit  Patient ID: Meredith [name], Female   DOB: October 27, 1951 73 y.o. MRN: [id number]  Chief Complaint  Patient presents with   Establish Care   Subjective:     Meredith [name] presents to establish care. Oriented to practice routines and expectations. PMH includes atrial fibrillation, HTN, HLD, DM2, CKD 3a, anxiety and depression, renal cyst, CAD Followed by Cardiology Dr. Shlomo  Atrial fibrillation: on xarelto  and metoprolol , s/p DCCV, afib clinic in January Denies chest pain, palpitations, recurrent headaches, vision changes, lightheadedness, dizziness, dyspnea on exertion, or swelling of extremities.  Mild nonobstructive CAD: on LHC 2016 due to abnormal stress test  HTN: on Metoprolol -XL 50mg  daily Denies chest pain, palpitations, recurrent headaches, vision changes, lightheadedness, dizziness, dyspnea on exertion, or swelling of extremities.   HLD: on Nexlizet  and Rosuastatin Lipid Panel     Component Value Date/Time   CHOL 134 03/14/2022 0808   TRIG 81 03/14/2022 0808   HDL 49 03/14/2022 0808   CHOLHDL 2.7 03/14/2022 0808   CHOLHDL 2.3 05/16/2016 0739   VLDL 17 05/16/2016 0739   LDLCALC 69 03/14/2022 0808   LDLDIRECT 130 (H) 05/13/2012 0917   LABVLDL 16 03/14/2022 0808   DM2: on Invokana  100mg  daily Lab Results  Component Value Date   HGBA1C 6.0 (H) 09/25/2024   HGBA1C 6.2 03/19/2024   HGBA1C 6.7 10/31/2023     MRI 09/13/2024: IMPRESSION: 1. There is a proteinaceous/hemorrhagic cyst in the right kidney upper pole, anteromedially corresponding to the structure described on the recent CT scan abdomen and pelvis. No suspicious renal lesion. 2. 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 Follow-up examination with MRI abdomen with MRCP protocol is recommended in 2 year. 3. Multiple other nonacute  observations, as described above.    HPI  Discussed the use of AI scribe software for clinical note transcription with the patient, who gave verbal consent to proceed.  History of Present Illness Meredith [name] is a 73 year old female with hypertension, high cholesterol, diabetes, chronic kidney disease and atrial fibrillation who presents to establish care  She has a history of hypertension, high cholesterol, diabetes, and atrial fibrillation, with the latter discovered during a hospitalization in October. She is currently taking Invokana , metoprolol , Xarelto , Nexlizant, rosuvastatin , and a stool softener. She is not taking valsartan , which was prescribed at her last visit, as she did not pick it up from the pharmacy. She monitors her blood pressure at home, which has been low at times.  Her diabetes is managed with Invokana . She was previously on metformin  but discontinued it due to adverse effects. Her last A1c was 6.0 in October, indicating good control. She eats three meals a day but in small portions.  She has a history of a lesion on her left kidney, evaluated with a CT scan and MRI. The MRI showed cysts. She is not aware of any current kidney issues.  She experiences chronic constipation and uses a stool softener regularly. She was previously on Miralax  for three days, which helped alleviate her symptoms. Denies abdominal pain, nausea, vomiting, melena, hematochezia  She has a history of polyps removed during a colonoscopy and is due for another colonoscopy. She has not had a mammogram recently and is due for one. She has received COVID vaccinations but is hesitant about other vaccines, such as pneumonia and tetanus, due to concerns about getting sick.  She lives alone and manages her medications independently. She reports some anxiety and depression following a job hospital doctor after thirty years but states she is 'okay' now.   Outpatient Encounter Medications as of 11/10/2024  Medication  Sig   ACCU-CHEK AVIVA PLUS test strip USE AS INSTRUCTED TEST BLOOD GLUCOSE ONCE DAILY. ICD-10 CODE: E11.9   Bempedoic Acid -Ezetimibe  (NEXLIZET ) 180-10 MG TABS Take 1 tablet by mouth daily.   canagliflozin  (INVOKANA ) 100 MG TABS tablet Take 1 tablet (100 mg total) by mouth daily before breakfast.   metoprolol  succinate (TOPROL -XL) 50 MG 24 hr tablet Take 50 mg by mouth daily.   olopatadine  (PATANOL) 0.1 % ophthalmic solution Place 1 drop into both eyes 2 (two) times daily.   rivaroxaban  (XARELTO ) 20 MG TABS tablet Take 1 tablet (20 mg total) by mouth daily.   rosuvastatin  (CRESTOR ) 40 MG tablet Take 1 tablet (40 mg total) by mouth daily.   [DISCONTINUED] cephALEXin  (KEFLEX ) 500 MG capsule Take 1 capsule (500 mg total) by mouth 2 (two) times daily.   [DISCONTINUED] polyethylene glycol powder (GLYCOLAX /MIRALAX ) 17 GM/SCOOP powder Take 17 g by mouth 2 (two) times daily as needed. Dissolve 1 capful (17g) in 4-8 ounces of liquid and take by mouth daily.   [DISCONTINUED] fexofenadine  (ALLEGRA ) 180 MG tablet Take 180 mg by mouth daily as needed. (Patient not taking: Reported on 11/10/2024)   [DISCONTINUED] valsartan  (DIOVAN ) 160 MG tablet Take 1 tablet (160 mg total) by mouth at bedtime. (Patient not taking: Reported on 11/10/2024)   No facility-administered encounter medications on file as of 11/10/2024.    Past Medical History:  Diagnosis Date   ANEMIA, IRON DEFICIENCY, UNSPEC. 02/05/2007   Qualifier: Diagnosis of  By: Cleatus MD, Arlyss     ASTHMA, INTERMITTENT 09/07/2010   Qualifier: Diagnosis of  By: Gwenyth MD, [place]     CAD (coronary artery disease), native coronary artery 02/16/2015   Cath with 20% LCx and RCA   Colon polyps    CTS (carpal tunnel syndrome) 06/06/2016   Diabetes mellitus    Diverticulosis 05/17/12   DJD (degenerative joint disease) of lumbar spine    Enteritis    Flu vaccine need 09/19/2022   Gastric ulcer 04/2000   Hyperlipidemia    Hypertension    Irregular heart beat     Obesity    Osteoarthritis    Persistent atrial fibrillation (HCC)    CHADS2VASC score is 5 and on Xarelto    Pruritus 02/21/2021   Rash 02/15/2021   Renal cyst, left 05/17/12   Ventral hernia     Past Surgical History:  Procedure Laterality Date   ABDOMINAL HYSTERECTOMY     LEFT HEART CATHETERIZATION WITH CORONARY ANGIOGRAM N/A 02/16/2015   Procedure: LEFT HEART CATHETERIZATION WITH CORONARY ANGIOGRAM;  Surgeon: Lonni JONETTA Cash, MD;  Location: [facility] CATH LAB;  Service: Cardiovascular;  Laterality: N/A;   OPERATIVE HYSTEROSCOPY     TUBAL LIGATION     UTERINE FIBROID SURGERY      Family History  Problem Relation Age of Onset   Diabetes Mother    Kidney disease Mother    Heart disease Mother    Lung cancer Father    Heart disease Father    Stomach cancer Sister    Clotting disorder Other        neice   Diabetes Sister    Diabetes Sister    Diabetes Sister    Diabetes Sister    Colon cancer Neg Hx     Social History  Socioeconomic History   Marital status: Divorced    Spouse name: Not on file   Number of children: 4   Years of education: 8   Highest education level: Not on file  Occupational History   Occupation: COOK    Employer: [employer]    Comment: 3 days a week  Tobacco Use   Smoking status: Former    Current packs/day: 0.00    Average packs/day: 1 pack/day for 5.0 years (5.0 ttl pk-yrs)    Types: Cigarettes    Start date: 12/09/1973    Quit date: 12/09/1978    Years since quitting: 45.9   Smokeless tobacco: Never  Vaping Use   Vaping status: Never Used  Substance and Sexual Activity   Alcohol use: No   Drug use: No   Sexual activity: Not Currently    Birth control/protection: Surgical  Other Topics Concern   Not on file  Social History Narrative   Patient lives alone in [place].   Patient still works 3 days a week at [employer] to keep herself busy.   Patient enjoys walking everyday and speaking with various neighbors.    Patient enjoys  watching tv, puzzle books, spending time with her family, and reading her prayers.                                                                                                      Social Drivers of Corporate Investment Banker Strain: Low Risk  (10/07/2024)   Overall Financial Resource Strain (CARDIA)    Difficulty of Paying Living Expenses: Not very hard  Food Insecurity: Food Insecurity Present (10/28/2024)   Hunger Vital Sign    Worried About Running Out of Food in the Last Year: Sometimes true    Ran Out of Food in the Last Year: Sometimes true  Transportation Needs: No Transportation Needs (10/28/2024)   PRAPARE - Administrator, Civil Service (Medical): No    Lack of Transportation (Non-Medical): No  Physical Activity: Insufficiently Active (07/11/2023)   Exercise Vital Sign    Days of Exercise per Week: 3 days    Minutes of Exercise per Session: 30 min  Stress: No Stress Concern Present (07/11/2023)   [employer] of Occupational Health - Occupational Stress Questionnaire    Feeling of Stress : Not at all  Social Connections: Socially Isolated (09/25/2024)   Social Connection and Isolation Panel    Frequency of Communication with Friends and Family: More than three times a week    Frequency of Social Gatherings with Friends and Family: Three times a week    Attends Religious Services: Never    Active Member of Clubs or Organizations: No    Attends Banker Meetings: Never    Marital Status: Divorced  Catering Manager Violence: Not At Risk (10/28/2024)   Humiliation, Afraid, Rape, and Kick questionnaire    Fear of Current or Ex-Partner: No    Emotionally Abused: No    Physically Abused: No    Sexually Abused: No    Review of Systems  Constitutional: Negative.  HENT: Negative.    Eyes: Negative.   Respiratory: Negative.    Cardiovascular: Negative.   Gastrointestinal: Negative.   Genitourinary: Negative.   Musculoskeletal: Negative.    Skin: Negative.   Neurological: Negative.   Endo/Heme/Allergies: Negative.   Psychiatric/Behavioral:  Positive for depression. The patient is nervous/anxious.   All other systems reviewed and are negative.     Objective:    BP 122/70   Pulse 67   Temp 97.9 F (36.6 C)   Ht 5' 3 (1.6 m)   Wt 173 lb (78.5 kg)   SpO2 98%   BMI 30.65 kg/m   Physical Exam Vitals and nursing note reviewed.  Constitutional:      Appearance: Normal appearance. She is obese.  HENT:     Head: Normocephalic and atraumatic.     Right Ear: Tympanic membrane, ear canal and external ear normal.     Left Ear: Tympanic membrane, ear canal and external ear normal.     Nose: Nose normal.     Mouth/Throat:     Mouth: Mucous membranes are moist.     Pharynx: Oropharynx is clear.  Eyes:     Extraocular Movements: Extraocular movements intact.     Conjunctiva/sclera: Conjunctivae normal.     Pupils: Pupils are equal, round, and reactive to light.  Cardiovascular:     Rate and Rhythm: Normal rate and regular rhythm.     Pulses: Normal pulses.     Heart sounds: Normal heart sounds.  Pulmonary:     Effort: Pulmonary effort is normal.     Breath sounds: Normal breath sounds.  Abdominal:     General: Bowel sounds are normal.     Palpations: Abdomen is soft.  Musculoskeletal:        General: Normal range of motion.     Cervical back: Normal range of motion and neck supple.  Skin:    General: Skin is warm and dry.     Capillary Refill: Capillary refill takes less than 2 seconds.  Neurological:     General: No focal deficit present.     Mental Status: She is alert and oriented to person, place, and time. Mental status is at baseline.  Psychiatric:        Mood and Affect: Mood normal.        Behavior: Behavior normal.        Thought Content: Thought content normal.        Judgment: Judgment normal.          Assessment & Plan:   Problem List Items Addressed This Visit     HYPERCHOLESTEROLEMIA  (Chronic)   Relevant Orders   Lipid panel   DEPRESSION, MAJOR, RECURRENT   HYPERTENSION, BENIGN SYSTEMIC (Chronic)   Relevant Orders   CBC with Differential/Platelet   Comprehensive metabolic panel with GFR   Lipid panel   GASTROESOPHAGEAL REFLUX, NO ESOPHAGITIS   Persistent atrial fibrillation (HCC) (Chronic)   Relevant Orders   CBC with Differential/Platelet   CAD (coronary artery disease), native coronary artery   Osteopenia   Relevant Orders   DG Bone Density   Chronic kidney disease (CKD)   Relevant Orders   Comprehensive metabolic panel with GFR   Physical exam, annual - Primary   Relevant Orders   CBC with Differential/Platelet   Comprehensive metabolic panel with GFR   Lipid panel   VITAMIN D 25 Hydroxy (Vit-D Deficiency, Fractures)   Ambulatory referral to Gastroenterology   Microalbumin / creatinine urine ratio  DG Bone Density   Controlled type 2 diabetes mellitus without complication, without long-term current use of insulin  (HCC)   Relevant Orders   CBC with Differential/Platelet   Comprehensive metabolic panel with GFR   Lipid panel   Microalbumin / creatinine urine ratio   Other Visit Diagnoses       Colon cancer screening       Relevant Orders   Ambulatory referral to Gastroenterology     Screening for osteoporosis       Relevant Orders   DG Bone Density     Encounter for vitamin deficiency screening       Relevant Orders   VITAMIN D 25 Hydroxy (Vit-D Deficiency, Fractures)       Assessment and Plan Assessment & Plan Adult Wellness Visit Routine wellness visit to review medical history, current medications, and health maintenance. Discussed the importance of vaccinations and screenings. - Ordered blood work. - Ordered mammogram. - Discussed colonoscopy referral. - Discussed pneumonia and tetanus vaccines. - Fasting labs today  Persistent atrial fibrillation Diagnosed in October. Managed with Xarelto  and Metoprolol . No symptoms. Cardiology  follow-up in January to evaluate anticoagulation need. - Continue Xarelto . - Follow up with cardiology in January.  Type 2 diabetes mellitus Previously managed with metformin , discontinued due to side effects. Last A1c 6.0 in October, indicating good control. - Continue Invokana   Essential hypertension Blood pressure well-controlled. Valsartan  not started due to normal readings. - Continue Metoprolol   Pure hypercholesterolemia Cholesterol levels not recently assessed. On rosuvastatin  and Nexlizet . - Ordered blood work to assess cholesterol levels.  Stage 3a chronic kidney disease - CMP today, would benefit from ACE or ARB - No offending medications - Renal cyst on CT  Osteopenia Low bone density. No recent DEXA scan. - Ordered DEXA scan.  GERD - Diet controlled    Return in about 3 months (around 02/08/2025) for chronic follow-up with labs 1 week prior.   Jeoffrey GORMAN Barrio, FNP [facility]

## 2024-11-10 NOTE — Patient Instructions (Signed)
 It was great to meet you today and I'm excited to have you join the [facility] practice. I hope you had a positive experience today! If you feel so inclined, please feel free to recommend our practice to friends and family. Jeoffrey Barrio, FNP-C

## 2024-11-11 ENCOUNTER — Ambulatory Visit

## 2024-11-11 LAB — COMPREHENSIVE METABOLIC PANEL WITH GFR
AG Ratio: 1.3 (calc) (ref 1.0–2.5)
ALT: 12 U/L (ref 6–29)
AST: 17 U/L (ref 10–35)
Albumin: 3.7 g/dL (ref 3.6–5.1)
Alkaline phosphatase (APISO): 36 U/L — ABNORMAL LOW (ref 37–153)
BUN: 11 mg/dL (ref 7–25)
CO2: 29 mmol/L (ref 20–32)
Calcium: 9.9 mg/dL (ref 8.6–10.4)
Chloride: 105 mmol/L (ref 98–110)
Creat: 0.93 mg/dL (ref 0.60–1.00)
Globulin: 2.8 g/dL (ref 1.9–3.7)
Glucose, Bld: 175 mg/dL — ABNORMAL HIGH (ref 65–99)
Potassium: 3.4 mmol/L — ABNORMAL LOW (ref 3.5–5.3)
Sodium: 142 mmol/L (ref 135–146)
Total Bilirubin: 0.6 mg/dL (ref 0.2–1.2)
Total Protein: 6.5 g/dL (ref 6.1–8.1)
eGFR: 64 mL/min/1.73m2 (ref >=60)

## 2024-11-11 LAB — CBC WITH DIFFERENTIAL/PLATELET
Absolute Lymphocytes: 1274 {cells}/uL (ref 850–3900)
Absolute Monocytes: 459 {cells}/uL (ref 200–950)
Basophils Absolute: 38 {cells}/uL (ref 0–200)
Basophils Relative: 0.7 %
Eosinophils Absolute: 92 {cells}/uL (ref 15–500)
Eosinophils Relative: 1.7 %
HCT: 36.6 % (ref 35.9–46.0)
Hemoglobin: 11.7 g/dL (ref 11.7–15.5)
MCH: 26.2 pg — ABNORMAL LOW (ref 27.0–33.0)
MCHC: 32 g/dL (ref 31.6–35.4)
MCV: 81.9 fL (ref 81.4–101.7)
MPV: 11.9 fL (ref 7.5–12.5)
Monocytes Relative: 8.5 %
Neutro Abs: 3537 {cells}/uL (ref 1500–7800)
Neutrophils Relative %: 65.5 %
Platelets: 226 Thousand/uL (ref 140–400)
RBC: 4.47 Million/uL (ref 3.80–5.10)
RDW: 16.5 % — ABNORMAL HIGH (ref 11.0–15.0)
Total Lymphocyte: 23.6 %
WBC: 5.4 Thousand/uL (ref 3.8–10.8)

## 2024-11-11 LAB — LIPID PANEL
Cholesterol: 129 mg/dL (ref <200)
HDL: 49 mg/dL — ABNORMAL LOW (ref >=50)
LDL Cholesterol (Calc): 63 mg/dL
Non-HDL Cholesterol (Calc): 80 mg/dL (ref <130)
Total CHOL/HDL Ratio: 2.6 (calc) (ref <5.0)
Triglycerides: 89 mg/dL (ref <150)

## 2024-11-11 LAB — MICROALBUMIN / CREATININE URINE RATIO
Creatinine, Urine: 65 mg/dL (ref 20–275)
Microalb Creat Ratio: 20 mg/g{creat} (ref <30)
Microalb, Ur: 1.3 mg/dL

## 2024-11-11 LAB — VITAMIN D 25 HYDROXY (VIT D DEFICIENCY, FRACTURES): Vit D, 25-Hydroxy: 25 ng/mL — ABNORMAL LOW (ref 30–100)

## 2024-11-15 ENCOUNTER — Ambulatory Visit: Payer: Self-pay | Admitting: Family Medicine

## 2024-11-15 DIAGNOSIS — E876 Hypokalemia: Secondary | ICD-10-CM

## 2024-11-15 MED ORDER — POTASSIUM CHLORIDE CRYS ER 10 MEQ PO TBCR: 10.0000 meq | EXTENDED_RELEASE_TABLET | Freq: Every day | ORAL | 0 refills | Status: AC

## 2024-11-19 ENCOUNTER — Telehealth: Payer: Self-pay

## 2024-11-19 NOTE — Telephone Encounter (Unsigned)
 Copied from CRM #[id number]. Topic: Clinical - Prescription Issue >> Nov 19, 2024 12:12 PM Wess RAMAN wrote: Reason for CRM: Patient stated she is unable to find a ride to get Vitamin D  1000 units daily and potassium chloride  (KLOR-CON  M) 10 MEQ tablet  Callback #: [phone]

## 2024-11-19 NOTE — Telephone Encounter (Signed)
 Copied from CRM #[id number]. Topic: General - Other >> Nov 19, 2024  1:25 PM Delon T wrote: Reason for CRM: received message about potassium and vitamin D , trying to get a ride to get the vitamin D , already has potassium at home.

## 2024-12-03 ENCOUNTER — Other Ambulatory Visit: Payer: Self-pay | Admitting: *Deleted

## 2024-12-03 ENCOUNTER — Other Ambulatory Visit: Payer: Self-pay

## 2024-12-03 NOTE — Patient Outreach (Addendum)
 Complex Care Management   Visit Note  12/03/2024  Name:  Meredith [name] MRN: [id number] DOB: 21-Oct-1951  Situation: Referral received for Complex Care Management related to HTN I obtained verbal consent from Patient.  Visit completed with Patient  on the phone  Background:   Past Medical History:  Diagnosis Date   ANEMIA, IRON DEFICIENCY, UNSPEC. 02/05/2007   Qualifier: Diagnosis of  By: Cleatus MD, Arlyss     ASTHMA, INTERMITTENT 09/07/2010   Qualifier: Diagnosis of  By: Gwenyth MD, [place]     CAD (coronary artery disease), native coronary artery 02/16/2015   Cath with 20% LCx and RCA   Colon polyps    CTS (carpal tunnel syndrome) 06/06/2016   Diabetes mellitus    Diverticulosis 05/17/12   DJD (degenerative joint disease) of lumbar spine    Enteritis    Flu vaccine need 09/19/2022   Gastric ulcer 04/2000   Hyperlipidemia    Hypertension    Irregular heart beat    Obesity    Osteoarthritis    Persistent atrial fibrillation (HCC)    CHADS2VASC score is 5 and on Xarelto    Pruritus 02/21/2021   Rash 02/15/2021   Renal cyst, left 05/17/12   Ventral hernia     Assessment: Patient Reported Symptoms:  Cognitive Cognitive Status: No symptoms reported, Able to follow simple commands, Alert and oriented to person, place, and time, Insightful and able to interpret abstract concepts, Normal speech and language skills Cognitive/Intellectual Conditions Management [RPT]: None reported or documented in medical history or problem list   Health Maintenance Behaviors: None Healing Pattern: Average Health Facilitated by: Healthy diet, Prayer/meditation, Rest, Stress management  Neurological   Neurological Management Strategies: Coping strategies  HEENT HEENT Symptoms Reported: No symptoms reported HEENT Management Strategies: Adequate rest, Coping strategies, Routine screening HEENT Self-Management Outcome: 4 (good)    Cardiovascular Cardiovascular Symptoms Reported: No symptoms reported Does  patient have uncontrolled Hypertension?: Yes Is patient checking Blood Pressure at home?: No Patient's Recent BP reading at home: Patient reports that she has arthritis in her hands and shake real bad.  She reports that she is not able to take her blood pressure. Cardiovascular Management Strategies: Adequate rest, Routine screening Cardiovascular Self-Management Outcome: 3 (uncertain)  Respiratory Respiratory Symptoms Reported: No symptoms reported Respiratory Self-Management Outcome: 4 (good)  Endocrine Endocrine Symptoms Reported: No symptoms reported Is patient diabetic?: Yes Is patient checking blood sugars at home?: No Endocrine Self-Management Outcome: 3 (uncertain) Endocrine Comment: Patient reports that she has arthritis in her hands and her hands shake. She reports that she takes her blood sugar about once a week.  She was not able to provider any readings today.  She reports that she does not take her blood sugar daily because her hands shake and she does not want to help herself.  Gastrointestinal Gastrointestinal Symptoms Reported: Constipation Additional Gastrointestinal Details: Patient reports that she has constipation.  She informs me that she takes Senna 2 tablets daily to help with constipation. Gastrointestinal Self-Management Outcome: 3 (uncertain)    Genitourinary Genitourinary Symptoms Reported: No symptoms reported Genitourinary Self-Management Outcome: 4 (good)  Integumentary Integumentary Symptoms Reported: Other Other Integumentary Symptoms: Reports dry skin.  Patient reports that she uses a moisturizer to help with dry skin. Skin Management Strategies: Adequate rest, Coping strategies Skin Self-Management Outcome: 3 (uncertain)  Musculoskeletal Musculoskelatal Symptoms Reviewed: Other Other Musculoskeletal Symptoms: Patient reports that she uses a cane to get around with. Musculoskeletal Management Strategies: Adequate rest, Coping strategies Musculoskeletal  Self-Management Outcome:  4 (good) Falls in the past year?: No Number of falls in past year: 1 or less Patient at Risk for Falls Due to: No Fall Risks Fall risk Follow up: Falls evaluation completed  Psychosocial Psychosocial Symptoms Reported: No symptoms reported Behavioral Management Strategies: Adequate rest Major Change/Loss/Stressor/Fears (CP): Denies Quality of Family Relationships: helpful, involved, supportive Do you feel physically threatened by others?: No    12/03/2024    PHQ2-9 Depression Screening   Little interest or pleasure in doing things Not at all  Feeling down, depressed, or hopeless Not at all  PHQ-2 - Total Score 0  Trouble falling or staying asleep, or sleeping too much    Feeling tired or having little energy    Poor appetite or overeating     Feeling bad about yourself - or that you are a failure or have let yourself or your family down    Trouble concentrating on things, such as reading the newspaper or watching television    Moving or speaking so slowly that other people could have noticed.  Or the opposite - being so fidgety or restless that you have been moving around a lot more than usual    Thoughts that you would be better off dead, or hurting yourself in some way    PHQ2-9 Total Score    If you checked off any problems, how difficult have these problems made it for you to do your work, take care of things at home, or get along with other people    Depression Interventions/Treatment      There were no vitals filed for this visit. Pain Scale: 0-10 Pain Score: 0-No pain  Medications Reviewed Today     Reviewed by Jahmez Bily A, RN (Case Manager) on 12/03/24 at 1139  Med List Status: <None>   Medication Order Taking? Sig Documenting Provider Last Dose Status Informant  ACCU-CHEK AVIVA PLUS test strip [id number] Yes USE AS INSTRUCTED TEST BLOOD GLUCOSE ONCE DAILY. ICD-10 CODE: E11.9 Cleotilde Perkins, DO  Active   Bempedoic Acid -Ezetimibe  (NEXLIZET )  180-10 MG TABS [id number] Yes Take 1 tablet by mouth daily. Cleotilde Perkins, DO  Active   canagliflozin  (INVOKANA ) 100 MG TABS tablet [id number] Yes Take 1 tablet (100 mg total) by mouth daily before breakfast. Cleotilde Perkins, DO  Active   metoprolol  succinate (TOPROL -XL) 50 MG 24 hr tablet [id number] Yes Take 50 mg by mouth daily. [provider]  Active   olopatadine  (PATANOL) 0.1 % ophthalmic solution [id number] Yes Place 1 drop into both eyes 2 (two) times daily. [provider]  Active   potassium chloride  (KLOR-CON  M) 10 MEQ tablet [id number] Yes Take 1 tablet (10 mEq total) by mouth daily. Kayla Jeoffrey RAMAN, FNP  Active   rivaroxaban  (XARELTO ) 20 MG TABS tablet [id number] Yes Take 1 tablet (20 mg total) by mouth daily. Cleotilde Perkins, DO  Active   rosuvastatin  (CRESTOR ) 40 MG tablet [id number] Yes Take 1 tablet (40 mg total) by mouth daily. Cleotilde Perkins, DO  Active             Recommendation:   PCP Follow-up Specialty provider follow-up : Cardiology- 12/16/24  Continue Current Plan of Care  Follow Up Plan:   Telephone follow-up in 1 month: 12/27/24 @ 10:30 am.  Dinita Migliaccio, RN, BSN, ACM RN Care Manager [facility] [phone]

## 2024-12-03 NOTE — Patient Instructions (Signed)
 Visit Information  Thank you for taking time to visit with me today. Please don't hesitate to contact me if I can be of assistance to you before our next scheduled appointment.  Our next appointment is by telephone on 12/27/24  at 10:30 am. Please call the care guide team at [phone] if you need to cancel or reschedule your appointment.   Following is a copy of your care plan:   Goals Addressed             This Visit's Progress    VBCI RN Care Plan       Problems:  Chronic Disease Management support and education needs related to HTN  Goal: Over the next 90 days the Patient will attend all scheduled medical appointments: PCP and Specialist as evidenced by keeping all schedule appointments.          continue to work with Medical Illustrator and/or Social Worker to address care management and care coordination needs related to HTN as evidenced by adherence to care management team scheduled appointments     take all medications exactly as prescribed and will call provider for medication related questions as evidenced by compliance.     verbalize basic understanding of HTN disease process and self health management plan as evidenced by verbal explanation, recognize, monitor symptoms and life style changes.    Interventions:   Hypertension Interventions: Last practice recorded BP readings:  BP Readings from Last 3 Encounters:  11/10/24 122/70  10/26/24 136/62  10/16/24 128/75   Most recent eGFR/CrCl:  Lab Results  Component Value Date   EGFR 64 11/10/2024    No components found for: CRCL  Evaluation of current treatment plan related to hypertension self management and patient's adherence to plan as established by provider Provided education to patient re: stroke prevention, s/s of heart attack and stroke Reviewed medications with patient and discussed importance of compliance Discussed plans with patient for ongoing care management follow up and provided patient with direct  contact information for care management team Advised patient, providing education and rationale, to monitor blood pressure daily and record, calling PCP for findings outside established parameters Discussed complications of poorly controlled blood pressure such as heart disease, stroke, circulatory complications, vision complications, kidney impairment, sexual dysfunction Screening for signs and symptoms of depression related to chronic disease state  Assessed social determinant of health barriers  Patient Self-Care Activities:  Attend all scheduled provider appointments Attend church or other social activities Call pharmacy for medication refills 3-7 days in advance of running out of medications Call provider office for new concerns or questions  Take medications as prescribed   choose a place to take my blood pressure (home, clinic or office, retail store) write blood pressure results in a log or diary learn about high blood pressure keep a blood pressure log take blood pressure log to all doctor appointments call doctor for signs and symptoms of high blood pressure keep all doctor appointments take medications for blood pressure exactly as prescribed report new symptoms to your doctor eat more whole grains, fruits and vegetables, lean meats and healthy fats limit salt intake to 1500 mg/day  Plan:  Telephone follow up appointment with care management team member scheduled for:  12/27/24 @ 10:30 am.             Please call the Suicide and Crisis Lifeline: 988 call the [place]  National Suicide Prevention Lifeline: 787-825-4939 or TTY: 562-843-7608 TTY 308-798-3559) to talk to a trained counselor call 1-800-273-TALK (  toll free, 24 hour hotline) go to [facility] Urgent [facility] [address] [place] [phone]) call the [facility] Crisis Line: 224-795-9542 call 911 if you are experiencing a Mental Health or Behavioral Health Crisis or need  someone to talk to.  Patient verbalized understanding of Care plan and visit instructions communicated this visit  Victorian Gunn, RN, BSN, ACM RN Care Manager [employer] [phone]

## 2024-12-16 ENCOUNTER — Ambulatory Visit (HOSPITAL_COMMUNITY)
Admission: RE | Admit: 2024-12-16 | Discharge: 2024-12-16 | Disposition: A | Discharge status: Home / Self Care | Source: Ambulatory Visit | Attending: Physician Assistant | Admitting: Physician Assistant

## 2024-12-16 VITALS — BP 144/84 | HR 59 | Ht 63.0 in | Wt 179.5 lb

## 2024-12-16 DIAGNOSIS — I480 Paroxysmal atrial fibrillation: Secondary | ICD-10-CM

## 2024-12-16 DIAGNOSIS — I4891 Unspecified atrial fibrillation: Secondary | ICD-10-CM

## 2024-12-16 DIAGNOSIS — I4819 Other persistent atrial fibrillation: Secondary | ICD-10-CM

## 2024-12-16 DIAGNOSIS — D6869 Other thrombophilia: Secondary | ICD-10-CM

## 2024-12-16 MED ORDER — RIVAROXABAN 20 MG PO TABS: 20.0000 mg | ORAL_TABLET | Freq: Every day | ORAL | 2 refills | Status: AC

## 2024-12-16 MED ORDER — METOPROLOL SUCCINATE ER 50 MG PO TB24: 50.0000 mg | ORAL_TABLET | Freq: Every day | ORAL | 2 refills | Status: AC

## 2024-12-16 NOTE — Progress Notes (Signed)
 "   Primary Care Physician: Kayla Jeoffrey RAMAN, FNP Primary Cardiologist: Wilbert Bihari, MD Electrophysiologist: None  Referring Physician: ED   Meredith [name] is a 74 y.o. female with a history of paroxysmal AF (on Xarelto ), HTN, HLD, nonobstructive CAD, DM type II,  who presents for follow up in the [facility].  The patient was initially diagnosed with atrial fibrillation in 2016 after presenting to ED with symptoms of palpitations. Patient is on Xarelto  for stroke prevention. She is currently followed by Dr. Bihari for management of CAD and hypertension. She underwent a LHC in 2016 in the setting of abnormal stress test that revealed mild nonobstructive CAD.  She was seen in the ED on 08/31/2024 in the setting of shortness of breath and found to be in atrial fibrillation. She underwent a DCCV with restoration of sinus rhythm.  She was last seen on 09/03/2024 by Damien Braver, NP and was maintaining sinus rhythm with frequent PACs and bigeminy.  She was continued on metoprolol  and Xarelto  at that time. She was seen in the ED 09/24/24 and was found to be hypotensive with rapid afib. She underwent another DCCV at that time. This was in the setting of hypokalemia.   She presented to the ED again on 09/29/2024 with complaint of atrial fibrillation.  Reported episodes began after PCP reduced her BP medication. During visit she was in sinus rhythm with no evidence of A-fib at that time.  She was discharged in stable condition and advised to follow-up in the AF clinic.   Patient returns for follow up for atrial fibrillation. She remains in SR today and feels well. She denies any interim symptoms of afib. No bleeding issues on anticoagulation.   Today, she  denies symptoms of palpitations, chest pain, shortness of breath, orthopnea, PND, lower extremity edema, dizziness, presyncope, syncope, snoring, daytime somnolence, bleeding, or neurologic sequela. The patient is tolerating  medications without difficulties and is otherwise without complaint today.    Atrial Fibrillation Risk Factors:  she does not have symptoms or diagnosis of sleep apnea. she does not have a history of rheumatic fever. she does not have a history of alcohol use. The patient does have a history of early familial atrial fibrillation or other arrhythmias.  Atrial Fibrillation Management history:  Previous antiarrhythmic drugs: None Previous cardioversions: 08/31/24, 09/24/24 Previous ablations: None Anticoagulation history: Xarelto   ROS- All systems are reviewed and negative except as per the HPI above.  Past Medical History:  Diagnosis Date   ANEMIA, IRON DEFICIENCY, UNSPEC. 02/05/2007   Qualifier: Diagnosis of  By: Cleatus MD, Arlyss     ASTHMA, INTERMITTENT 09/07/2010   Qualifier: Diagnosis of  By: Gwenyth MD, [place]     CAD (coronary artery disease), native coronary artery 02/16/2015   Cath with 20% LCx and RCA   Colon polyps    CTS (carpal tunnel syndrome) 06/06/2016   Diabetes mellitus    Diverticulosis 05/17/12   DJD (degenerative joint disease) of lumbar spine    Enteritis    Flu vaccine need 09/19/2022   Gastric ulcer 04/2000   Hyperlipidemia    Hypertension    Irregular heart beat    Obesity    Osteoarthritis    Persistent atrial fibrillation (HCC)    CHADS2VASC score is 5 and on Xarelto    Pruritus 02/21/2021   Rash 02/15/2021   Renal cyst, left 05/17/12   Ventral hernia     Current Outpatient Medications  Medication Sig Dispense Refill   ACCU-CHEK  AVIVA PLUS test strip USE AS INSTRUCTED TEST BLOOD GLUCOSE ONCE DAILY. ICD-10 CODE: E11.9 100 strip 12   Bempedoic Acid -Ezetimibe  (NEXLIZET ) 180-10 MG TABS Take 1 tablet by mouth daily. 90 tablet 3   canagliflozin  (INVOKANA ) 100 MG TABS tablet Take 1 tablet (100 mg total) by mouth daily before breakfast. 90 tablet 3   olopatadine  (PATANOL) 0.1 % ophthalmic solution Place 1 drop into both eyes 2 (two) times daily.     potassium  chloride (KLOR-CON  M) 10 MEQ tablet Take 1 tablet (10 mEq total) by mouth daily. 7 tablet 0   rosuvastatin  (CRESTOR ) 40 MG tablet Take 1 tablet (40 mg total) by mouth daily. 90 tablet 0   metoprolol  succinate (TOPROL -XL) 50 MG 24 hr tablet Take 1 tablet (50 mg total) by mouth daily. 90 tablet 2   rivaroxaban  (XARELTO ) 20 MG TABS tablet Take 1 tablet (20 mg total) by mouth daily. 90 tablet 2   No current facility-administered medications for this encounter.    Physical Exam: BP (!) 144/84   Pulse (!) 59   Ht 5' 3 (1.6 m)   Wt 81.4 kg   BMI 31.80 kg/m   GEN: Well nourished, well developed in no acute distress CARDIAC: Regular rate and rhythm, no murmurs, rubs, gallops RESPIRATORY:  Clear to auscultation without rales, wheezing or rhonchi  ABDOMEN: Soft, non-tender, non-distended EXTREMITIES:  No edema; No deformity    Wt Readings from Last 3 Encounters:  12/16/24 81.4 kg  11/10/24 78.5 kg  10/26/24 77.7 kg     EKG Interpretation Date/Time:  Thursday December 16 2024 10:30:49 EST Ventricular Rate:  59 PR Interval:  190 QRS Duration:  112 QT Interval:  412 QTC Calculation: 407 R Axis:   3  Text Interpretation: Sinus bradycardia Septal infarct (cited on or before 16-Dec-2024) Abnormal ECG When compared with ECG of 13-Oct-2024 11:29, No significant change was found Confirmed by Mizuki Hoel ([id number]) on 12/16/2024 11:00:24 AM   Echo 09/26/2024 demonstrated  1. Left ventricular ejection fraction, by estimation, is 60 to 65%. The  left ventricle has normal function. The left ventricle has no regional  wall motion abnormalities. There is mild left ventricular hypertrophy.  Left ventricular diastolic parameters  were normal.   2. Right ventricular systolic function is normal. The right ventricular  size is normal. There is mildly elevated pulmonary artery systolic  pressure.   3. Left atrial size was mildly dilated.   4. The mitral valve is abnormal. No evidence of mitral valve   regurgitation. No evidence of mitral stenosis.   5. The aortic valve is tricuspid. There is mild calcification of the  aortic valve. Aortic valve regurgitation is trivial. Aortic valve  sclerosis is present, with no evidence of aortic valve stenosis.   6. The inferior vena cava is normal in size with greater than 50%  respiratory variability, suggesting right atrial pressure of 3 mmHg.    CHA2DS2-VASc Score = 4  The patient's score is based upon: CHF History: 0 HTN History: 1 Diabetes History: 1 Stroke History: 0 Vascular Disease History: 0 Age Score: 1 Gender Score: 1       ASSESSMENT AND PLAN: Paroxysmal Atrial Fibrillation (ICD10:  I48.0) The patient's CHA2DS2-VASc score is 4, indicating a 4.8% annual risk of stroke.   Patient appears to be maintaining SR. We discussed rhythm control options moving forward. She would like to continue her present therapy for now but would have low threshold to refer to EP for ablation consideration. Patient in  agreement with plan.  Continue Toprol  50 mg daily Continue Xarelto  20 mg daily  Secondary Hypercoagulable State (ICD10:  D68.69) The patient is at significant risk for stroke/thromboembolism based upon her CHA2DS2-VASc Score of 4.  Continue Rivaroxaban  (Xarelto ). No bleeding issues.   HTN Stable on current regimen  CAD No anginal symptoms Followed by Dr Shlomo.     Follow up with Dr Shlomo or APP in 3 months. AF clinic in 6 months.    [facility] [address] [place], [place] [address] [phone] "

## 2024-12-16 NOTE — Patient Instructions (Signed)
 Follow up with Dr Shlomo in 3 months - office will contact you.

## 2024-12-27 ENCOUNTER — Other Ambulatory Visit: Payer: Self-pay

## 2024-12-27 VITALS — BP 156/97 | HR 69

## 2024-12-27 DIAGNOSIS — I10 Essential (primary) hypertension: Secondary | ICD-10-CM

## 2024-12-27 DIAGNOSIS — E119 Type 2 diabetes mellitus without complications: Secondary | ICD-10-CM

## 2024-12-27 DIAGNOSIS — I4891 Unspecified atrial fibrillation: Secondary | ICD-10-CM

## 2024-12-27 NOTE — Patient Outreach (Signed)
 Complex Care Management   Visit Note  12/27/2024  Name:  Meredith [name] MRN: [id number] DOB: 1951-03-12  Situation: Referral received for Complex Care Management related to Atrial Fibrillation and type 2 Diabetes without complications, Benign Hypertension.  I obtained verbal consent from Patient.  Visit completed with Patient on the phone.  Background:   Past Medical History:  Diagnosis Date   ANEMIA, IRON DEFICIENCY, UNSPEC. 02/05/2007   Qualifier: Diagnosis of  By: Cleatus MD, Arlyss     ASTHMA, INTERMITTENT 09/07/2010   Qualifier: Diagnosis of  By: Gwenyth MD, [place]     CAD (coronary artery disease), native coronary artery 02/16/2015   Cath with 20% LCx and RCA   Colon polyps    CTS (carpal tunnel syndrome) 06/06/2016   Diabetes mellitus    Diverticulosis 05/17/12   DJD (degenerative joint disease) of lumbar spine    Enteritis    Flu vaccine need 09/19/2022   Gastric ulcer 04/2000   Hyperlipidemia    Hypertension    Irregular heart beat    Obesity    Osteoarthritis    Persistent atrial fibrillation (HCC)    CHADS2VASC score is 5 and on Xarelto    Pruritus 02/21/2021   Rash 02/15/2021   Renal cyst, left 05/17/12   Ventral hernia     Assessment: Patient Reported Symptoms:  Cognitive Cognitive Status: Alert and oriented to person, place, and time, Normal speech and language skills Cognitive/Intellectual Conditions Management [RPT]: None reported or documented in medical history or problem list      Neurological Neurological Review of Symptoms: No symptoms reported    HEENT HEENT Symptoms Reported: Other:, Frequent sneezing (laryngitis) HEENT Management Strategies: Routine screening HEENT Self-Management Outcome: 3 (uncertain)    Cardiovascular Cardiovascular Symptoms Reported: No symptoms reported Does patient have uncontrolled Hypertension?: Yes Is patient checking Blood Pressure at home?: Yes Patient's Recent BP reading at home: 156/97 Cardiovascular Management Strategies:  Medication therapy, Routine screening, Adequate rest Cardiovascular Self-Management Outcome: 3 (uncertain) Cardiovascular Comment: scheduled 2 week nurse follow up to re-evaluate home BP readings  Respiratory Respiratory Symptoms Reported: No symptoms reported    Endocrine  Endocrine Symptoms Reported: No symptoms reported    Gastrointestinal Gastrointestinal Symptoms Reported: Constipation Gastrointestinal Management Strategies: Medication therapy Gastrointestinal Self-Management Outcome: 4 (good)    Genitourinary Genitourinary Symptoms Reported: No symptoms reported    Integumentary  Integumentary Symptoms Reported: unable to assess     Musculoskeletal Musculoskelatal Symptoms Reviewed: Limited mobility Additional Musculoskeletal Details: patient has 4 steps within her home with rail, she can climb the stairs independently, uses a cane for ambulation   Falls in the past year?: Yes Number of falls in past year: 1 or less Was there an injury with Fall?: No Fall Risk Category Calculator: 1 Patient Fall Risk Level: Low Fall Risk Patient at Risk for Falls Due to: History of fall(s), Impaired mobility Fall risk Follow up: Education provided, Falls evaluation completed  Psychosocial Psychosocial Symptoms Reported: Alteration in eating habits   Major Change/Loss/Stressor/Fears (CP): Denies Quality of Family Relationships: helpful, involved, supportive Do you feel physically threatened by others?: No    12/27/2024    PHQ2-9 Depression Screening   [name] interest or pleasure in doing things    Feeling down, depressed, or hopeless    PHQ-2 - Total Score    Trouble falling or staying asleep, or sleeping too much    Feeling tired or having [name] energy    Poor appetite or overeating     Feeling bad about yourself -  or that you are a failure or have let yourself or your family down    Trouble concentrating on things, such as reading the newspaper or watching television    Moving or  speaking so slowly that other people could have noticed.  Or the opposite - being so fidgety or restless that you have been moving around a lot more than usual    Thoughts that you would be better off dead, or hurting yourself in some way    PHQ2-9 Total Score    If you checked off any problems, how difficult have these problems made it for you to do your work, take care of things at home, or get along with other people    Depression Interventions/Treatment      Today's Vitals   12/27/24 1103  BP: (!) 156/97  Pulse: 69   Pain Scale: Not given for pain  Medications Reviewed Today     Reviewed by Morgan Clayborne CROME, RN (Registered Nurse) on 12/27/24 at 1118  Med List Status: <None>   Medication Order Taking? Sig Documenting Provider Last Dose Status Informant  ACCU-CHEK AVIVA PLUS test strip [id number]  USE AS INSTRUCTED TEST BLOOD GLUCOSE ONCE DAILY. ICD-10 CODE: E11.9 Cleotilde Perkins, DO  Active   Bempedoic Acid -Ezetimibe  (NEXLIZET ) 180-10 MG TABS [id number] Yes Take 1 tablet by mouth daily. Cleotilde Perkins, DO  Active   canagliflozin  (INVOKANA ) 100 MG TABS tablet [id number] Yes Take 1 tablet (100 mg total) by mouth daily before breakfast. Cleotilde Perkins, DO  Active   metoprolol  succinate (TOPROL -XL) 50 MG 24 hr tablet [id number] Yes Take 1 tablet (50 mg total) by mouth daily. Fenton, Clint R, PA  Active   olopatadine  (PATANOL) 0.1 % ophthalmic solution [id number] Yes Place 1 drop into both eyes 2 (two) times daily. [provider]  Active   potassium chloride  (KLOR-CON  M) 10 MEQ tablet [id number]  Take 1 tablet (10 mEq total) by mouth daily.  Patient not taking: Reported on 12/27/2024   Kayla Jeoffrey RAMAN, FNP  Active   rivaroxaban  (XARELTO ) 20 MG TABS tablet [id number] Yes Take 1 tablet (20 mg total) by mouth daily. Fenton, Clint R, PA  Active   rosuvastatin  (CRESTOR ) 40 MG tablet [id number] Yes Take 1 tablet (40 mg total) by mouth daily. Cleotilde Perkins, DO  Active   senna (SENOKOT) 8.6 MG TABS  tablet [id number] Yes Take 1 tablet by mouth in the morning and at bedtime. [provider]  Active             Recommendation:   Continue Current Plan of Care  Follow Up Plan:   Telephone follow up appointment date/time    01/10/2025 Status: Sch   Time: 10:30 AM Length: 30  Visit Type: VBCI TELEPHONE CALL 30 [2502] Copay: $0.00  Provider: Morgan Clayborne CROME, RN Department: [facility]   Clayborne Morgan RN BSN CCM [place]  [facility] Nurse Care Coordinator  Direct Dial: [phone] Website: [email or web address]

## 2024-12-27 NOTE — Patient Instructions (Signed)
 Visit Information  Thank you for taking time to visit with me today. Please don't hesitate to contact me if I can be of assistance to you before our next scheduled appointment.  Your next care management appointment is by telephone on Monday, February 2 at 10:30 AM  Please call the care guide team at [phone] if you need to cancel, schedule, or reschedule an appointment.   Please call 1-800-273-TALK (toll free, 24 hour hotline) if you are experiencing a Mental Health or Behavioral Health Crisis or need someone to talk to.  Clayborne Ly RN BSN CCM [place]  [facility] Nurse Care Coordinator  Direct Dial: [phone] Website: [email or web address]

## 2024-12-28 ENCOUNTER — Telehealth: Payer: Self-pay

## 2024-12-28 NOTE — Progress Notes (Signed)
 Complex Care Management Note Care Guide Note  12/28/2024 Name: Meredith [name] MRN: [id number] DOB: February 26, 1951   Complex Care Management Outreach Attempts: An unsuccessful telephone outreach was attempted today to offer the patient information about available complex care management services.  Follow Up Plan:  Additional outreach attempts will be made to offer the patient complex care management information and services.   Encounter Outcome:  No Answer  Desean Heemstra [facility]  [facility] Guide Direct Dial: [phone]  Fax: [phone] Website: [email or web address]

## 2024-12-30 ENCOUNTER — Telehealth: Payer: Self-pay

## 2024-12-30 NOTE — Progress Notes (Signed)
 Complex Care Management Note Care Guide Note  12/30/2024 Name: [name] MRN: [id number] DOB: 1951-08-17  Meredith [name] is a 74 y.o. year old female who is a primary care patient of Kayla Jeoffrey RAMAN, FNP . The community resource team was consulted for assistance with Financial Difficulties related to utilities.  SDOH screenings and interventions completed:  Yes  Social Drivers of Health From This Encounter   Food Insecurity: No Food Insecurity (12/30/2024)   Epic    Worried About Programme Researcher, Broadcasting/film/video in the Last Year: Never true    Ran Out of Food in the Last Year: Never true  Housing: Low Risk (12/30/2024)   Epic    Unable to Pay for Housing in the Last Year: No    Number of Times Moved in the Last Year: 0    Homeless in the Last Year: No  Financial Resource Strain: Medium Risk (12/30/2024)   Overall Financial Resource Strain (CARDIA)    Difficulty of Paying Living Expenses: Somewhat hard  Transportation Needs: No Transportation Needs (12/30/2024)   Epic    Lack of Transportation (Medical): No    Lack of Transportation (Non-Medical): No  Utilities: At Risk (12/30/2024)   Epic    Threatened with loss of utilities: Yes    SDOH Interventions Today    Flowsheet Row Most Recent Value  SDOH Interventions   Utilities Interventions Other (Comment)  [Patient stated her sister helped pay her bill and she is now up to date. Verified mailing address to send information if needed in the future for [employer], [employer] application and [facility] Emergency Assistance.]     Care guide performed the following interventions: Patient provided with information about care guide support team and interviewed to confirm resource needs.  Follow Up Plan:  No further follow up planned at this time. The patient has been provided with needed resources.  Encounter Outcome:  Patient Visit Completed  Girolamo Lortie [facility]  [facility]  Guide Direct Dial: [phone]  Fax: [phone] Website: [email or web address]

## 2024-12-31 ENCOUNTER — Encounter: Payer: Self-pay | Admitting: Physician Assistant

## 2025-01-10 ENCOUNTER — Other Ambulatory Visit: Payer: Self-pay

## 2025-01-10 ENCOUNTER — Emergency Department (HOSPITAL_COMMUNITY)
Admission: EM | Admit: 2025-01-10 | Discharge: 2025-01-10 | Disposition: A | Discharge status: Home / Self Care | Attending: Emergency Medicine | Admitting: Emergency Medicine

## 2025-01-10 ENCOUNTER — Encounter (HOSPITAL_COMMUNITY): Payer: Self-pay

## 2025-01-10 ENCOUNTER — Ambulatory Visit: Payer: Self-pay

## 2025-01-10 DIAGNOSIS — Z7901 Long term (current) use of anticoagulants: Secondary | ICD-10-CM | POA: Insufficient documentation

## 2025-01-10 DIAGNOSIS — I10 Essential (primary) hypertension: Secondary | ICD-10-CM | POA: Insufficient documentation

## 2025-01-10 DIAGNOSIS — Z79899 Other long term (current) drug therapy: Secondary | ICD-10-CM | POA: Insufficient documentation

## 2025-01-10 NOTE — ED Provider Notes (Signed)
" °  [facility] Provider Note   CSN: [id number] Arrival date & time: 01/10/25  1642     Patient presents with: No chief complaint on file.   Meredith [name] is a 74 y.o. female.  {Add pertinent medical, surgical, social history, OB history to HPI:32947} HPI     Prior to Admission medications  Medication Sig Start Date End Date Taking? Authorizing Provider  ACCU-CHEK AVIVA PLUS test strip USE AS INSTRUCTED TEST BLOOD GLUCOSE ONCE DAILY. ICD-10 CODE: E11.9 09/28/24   Cleotilde Perkins, DO  Bempedoic Acid -Ezetimibe  (NEXLIZET ) 180-10 MG TABS Take 1 tablet by mouth daily. 09/28/24   Cleotilde Perkins, DO  canagliflozin  (INVOKANA ) 100 MG TABS tablet Take 1 tablet (100 mg total) by mouth daily before breakfast. 09/28/24   Cleotilde Perkins, DO  metoprolol  succinate (TOPROL -XL) 50 MG 24 hr tablet Take 1 tablet (50 mg total) by mouth daily. 12/16/24   Fenton, Clint R, PA  olopatadine  (PATANOL) 0.1 % ophthalmic solution Place 1 drop into both eyes 2 (two) times daily. 09/28/24   [provider]  potassium chloride  (KLOR-CON  M) 10 MEQ tablet Take 1 tablet (10 mEq total) by mouth daily. Patient not taking: Reported on 12/27/2024 11/15/24   Kayla Jeoffrey RAMAN, FNP  rivaroxaban  (XARELTO ) 20 MG TABS tablet Take 1 tablet (20 mg total) by mouth daily. 12/16/24   Fenton, Clint R, PA  rosuvastatin  (CRESTOR ) 40 MG tablet Take 1 tablet (40 mg total) by mouth daily. 09/28/24   Cleotilde Perkins, DO  senna (SENOKOT) 8.6 MG TABS tablet Take 1 tablet by mouth in the morning and at bedtime.    [provider]    Allergies: Lisinopril , Doxycycline , Flexeril [cyclobenzaprine hcl], Lipitor [atorvastatin calcium ], and Metaxalone    Review of Systems  Updated Vital Signs BP (!) 164/83 (BP Location: Right Arm)   Pulse 62   Temp 97.8 F (36.6 C)   Resp 17   SpO2 100%   Physical Exam  (all labs ordered are listed, but only abnormal results are displayed) Labs Reviewed -  No data to display  EKG: None  Radiology: No results found.  {Document cardiac monitor, telemetry assessment procedure when appropriate:32947} Procedures   Medications Ordered in the ED - No data to display    {Click here for ABCD2, HEART and other calculators REFRESH Note before signing:1}                              Medical Decision Making  ***  {Document critical care time when appropriate  Document review of labs and clinical decision tools ie CHADS2VASC2, etc  Document your independent review of radiology images and any outside records  Document your discussion with family members, caretakers and with consultants  Document social determinants of health affecting pt's care  Document your decision making why or why not admission, treatments were needed:32947:::1}   Final diagnoses:  None    ED Discharge Orders     None        "

## 2025-01-10 NOTE — Telephone Encounter (Signed)
 RN Care Manager, Clayborne Ly, on the phone. She states she just spoke with Jeoffrey Barrio, PCP, about patient's blood pressure readings at 176/115 and 189/111. Clayborne states she was instructed by [facility] to call and get the patient scheduled for soonest available video visit with her to address medication discrepancies. Scheduled paitent for 2:30pm tomorrow, she does not have a mychart so unable to provide instructions and she has not attempted a video visit in the past. Will need clinic to call and advise.  Message from [place] R sent at 01/10/2025  3:36 PM EST  Reason for Triage: Patients BP is 189/111 pulse 71.

## 2025-01-10 NOTE — Patient Instructions (Signed)
 Visit Information  Thank you for taking time to visit with me today. Please don't hesitate to contact me if I can be of assistance to you before our next scheduled appointment.  Your next care management appointment is by telephone on Friday, February  at 11:00 AM  Please call the care guide team at [phone] if you need to cancel, schedule, or reschedule an appointment.   Please call 1-800-273-TALK (toll free, 24 hour hotline) if you are experiencing a Mental Health or Behavioral Health Crisis or need someone to talk to.  Clayborne Ly RN BSN CCM [place]  [facility] Nurse Care Coordinator  Direct Dial: [phone] Website: [email or web address]

## 2025-01-10 NOTE — Telephone Encounter (Signed)
 This encounter was created in error - please disregard.

## 2025-01-10 NOTE — ED Triage Notes (Signed)
 Patient states that a nurse called her and told her that she needed to come to the ED because her blood pressure was to high. She states that she feels fine. Denies headache.

## 2025-01-11 ENCOUNTER — Other Ambulatory Visit: Payer: Self-pay | Admitting: Family Medicine

## 2025-01-11 ENCOUNTER — Telehealth: Admitting: Family Medicine

## 2025-01-11 ENCOUNTER — Encounter: Payer: Self-pay | Admitting: Family Medicine

## 2025-01-11 DIAGNOSIS — E119 Type 2 diabetes mellitus without complications: Secondary | ICD-10-CM

## 2025-01-11 DIAGNOSIS — I10 Essential (primary) hypertension: Secondary | ICD-10-CM

## 2025-01-11 DIAGNOSIS — Z7984 Long term (current) use of oral hypoglycemic drugs: Secondary | ICD-10-CM | POA: Diagnosis not present

## 2025-01-11 MED ORDER — INVOKANA 100 MG PO TABS: 100.0000 mg | ORAL_TABLET | Freq: Every day | ORAL | 0 refills | Status: DC

## 2025-01-11 MED ORDER — NEXLIZET 180-10 MG PO TABS: 1.0000 | ORAL_TABLET | Freq: Every day | ORAL | 0 refills | Status: AC

## 2025-01-11 NOTE — Progress Notes (Signed)
 "                    MyChart Video Visit    Virtual Visit via Video Note   This format is felt to be most appropriate for this patient at this time. Physical exam was limited by quality of the video and audio technology used for the visit.    Patient location: home Provider location: [place] [facility] Persons involved in the visit: patient, provider Unable to connect via video due to technology limitations on patients side, connected via telephone.  I discussed the limitations of evaluation and management by telemedicine and the availability of in person appointments. The patient expressed understanding and agreed to proceed.  Patient: Meredith [name]   DOB: 1951-03-25   74 y.o. Female  MRN: [id number] Visit Date: 01/11/2025  Today's healthcare provider: Jeoffrey GORMAN Barrio, FNP   No chief complaint on file.   Subjective:    HPI  Discussed the use of AI scribe software for clinical note transcription with the patient, who gave verbal consent to proceed.  History of Present Illness Meredith [name] is a 74 year old female with hypertension who presents for a review of her blood pressure medications and uncontrolled hypertension.  She is currently taking metoprolol  50 mg per day for her blood pressure. She is unsure if she is on any other blood pressure medications and does not recall taking valsartan , which she was previously prescribed.  She recently visited the hospital after her blood pressure was recorded at 189/111 mmHg. There is was recorded at 150/76. No blood work was done, and her medication regimen was not adjusted.  She is out of her medications Invokana  and Nexlizet  but did not specify the dosages she was taking.   Review of Systems  Respiratory: Negative.    Cardiovascular: Negative.   All other systems reviewed and are negative.        Objective:    There were no vitals taken for this visit.  BP Readings from Last 3 Encounters:   01/10/25 (!) 150/76  01/10/25 (!) 189/111  12/27/24 (!) 156/97   Wt Readings from Last 3 Encounters:  12/16/24 179 lb 8 oz (81.4 kg)  11/10/24 173 lb (78.5 kg)  10/26/24 171 lb 3.2 oz (77.7 kg)        Physical Exam Neurological:     Mental Status: She is alert and oriented to person, place, and time.         Assessment & Plan:    Problem List Items Addressed This Visit   None   Assessment and Plan Assessment & Plan Essential hypertension Blood pressure poorly controlled at 150/76 mmHg most recently. Currently on metoprolol  50 mg daily. Valsartan  not taken. No changes to regimen during recent hospital visit. - Refills provided for Invokana  and Nexlizet . - Will likely need to restart Valsartan  however I have no recent labs nor is she able to provide a list of current medications. - Scheduled office visit for medication review and blood work tomorrow. - Seek immediate medical care for chest pain, palpitations, recurrent headaches, vision changes, lightheadedness, dizziness, dyspnea on exertion, or swelling of extremities.  Type 2 diabetes mellitus Out of canagliflozin  (Invokana ). - Resent prescription for canagliflozin  (Invokana ) to pharmacy.    No orders of the defined types were placed in this encounter.    No follow-ups on file.     I discussed the assessment and treatment plan with the patient. The patient  was provided an opportunity to ask questions and all were answered. The patient agreed with the plan and demonstrated an understanding of the instructions.   The patient was advised to call back or seek an in-person evaluation if the symptoms worsen or if the condition fails to improve as anticipated.  I provided 11 minutes of non-face-to-face time during this encounter.  Jeoffrey GORMAN Barrio, FNP  [facility]    "

## 2025-01-12 ENCOUNTER — Ambulatory Visit: Admitting: Family Medicine

## 2025-01-12 ENCOUNTER — Telehealth: Payer: Self-pay

## 2025-01-12 ENCOUNTER — Encounter: Payer: Self-pay | Admitting: Family Medicine

## 2025-01-12 VITALS — BP 160/80 | HR 62 | Ht 63.0 in | Wt 178.4 lb

## 2025-01-12 DIAGNOSIS — I10 Essential (primary) hypertension: Secondary | ICD-10-CM

## 2025-01-12 DIAGNOSIS — E559 Vitamin D deficiency, unspecified: Secondary | ICD-10-CM

## 2025-01-12 DIAGNOSIS — Z5982 Transportation insecurity: Secondary | ICD-10-CM

## 2025-01-12 DIAGNOSIS — E119 Type 2 diabetes mellitus without complications: Secondary | ICD-10-CM

## 2025-01-12 DIAGNOSIS — Z9189 Other specified personal risk factors, not elsewhere classified: Secondary | ICD-10-CM

## 2025-01-12 MED ORDER — VALSARTAN 80 MG PO TABS: 80.0000 mg | ORAL_TABLET | Freq: Every day | ORAL | 0 refills | Status: AC

## 2025-01-12 NOTE — Progress Notes (Signed)
 "  Acute Office Visit  Patient ID: Meredith [name], female    DOB: [birth year], 74 y.o.   MRN: [id number]  PCP: Meredith Meredith RAMAN, FNP  Chief Complaint  Patient presents with   Medical Management of Chronic Issues    Blood pressure      Subjective:     HPI  Discussed the use of AI scribe software for clinical note transcription with the patient, who gave verbal consent to proceed.  History of Present Illness Meredith [name] is a 74 year old female with hypertension who presents with elevated blood pressure readings.  She has been monitoring her blood pressure at home and noted elevated readings, particularly on January 19th during a nurse call. Readings were reported 189/111 She subsequently visited the emergency room at which time her readings were 164/83. No changes were made to her medication regimen.   She is currently taking her medications daily without assistance and lives alone. She was previously on valsartan , which was discontinued for an unknown reason. She also takes Xarelto  and Nexilant.  She has difficulty attending appointments due to transportation issues, as her sister, who used to assist her, is no longer available. She drives herself short distances but avoids driving when her leg is shaking. She is concerned about the weather affecting her ability to attend future appointments.  She denies chest pain, palpitations, recurrent headaches, vision changes, lightheadedness, dizziness, dyspnea on exertion, or swelling of extremities.    Review of Systems  All other systems reviewed and are negative.   Past Medical History:  Diagnosis Date   ANEMIA, IRON DEFICIENCY, UNSPEC. 02/05/2007   Qualifier: Diagnosis of  By: Cleatus MD, Arlyss     ASTHMA, INTERMITTENT 09/07/2010   Qualifier: Diagnosis of  By: Gwenyth MD, [place]     CAD (coronary artery disease), native coronary artery 02/16/2015   Cath with 20% LCx and RCA   Colon polyps    CTS (carpal tunnel syndrome) 06/06/2016    Diabetes mellitus    Diverticulosis 05/17/12   DJD (degenerative joint disease) of lumbar spine    Enteritis    Flu vaccine need 09/19/2022   Gastric ulcer 04/2000   Hyperlipidemia    Hypertension    Irregular heart beat    Obesity    Osteoarthritis    Persistent atrial fibrillation (HCC)    CHADS2VASC score is 5 and on Xarelto    Pruritus 02/21/2021   Rash 02/15/2021   Renal cyst, left 05/17/12   Ventral hernia     Past Surgical History:  Procedure Laterality Date   ABDOMINAL HYSTERECTOMY     LEFT HEART CATHETERIZATION WITH CORONARY ANGIOGRAM N/A 02/16/2015   Procedure: LEFT HEART CATHETERIZATION WITH CORONARY ANGIOGRAM;  Surgeon: Lonni JONETTA Cash, MD;  Location[facility] C[facility] LAB;  Service: Cardiovascular;  Laterality: N/A;   OPERATIVE HYSTEROSCOPY     TUBAL LIGATION     UTERINE FIBROID SURGERY      Outpatient Medications Prior to Visit  Medication Sig Dispense Refill   ACCU-CHEK AVIVA PLUS test strip USE AS INSTRUCTED TEST BLOOD GLUCOSE ONCE DAILY. ICD-10 CODE: E11.9 100 strip 12   Bempedoic Acid -Ezetimibe  (NEXLIZET ) 180-10 MG TABS Take 1 tablet by mouth daily. 30 tablet 0   canagliflozin  (INVOKANA ) 100 MG TABS tablet Take 1 tablet (100 mg total) by mouth daily before breakfast. 30 tablet 0   Cholecalciferol (VITAMIN D3) 1000 units CAPS Take 1 capsule by mouth daily.     metoprolol  succinate (TOPROL -XL) 50 MG 24 hr tablet Take  1 tablet (50 mg total) by mouth daily. 90 tablet 2   olopatadine  (PATANOL) 0.1 % ophthalmic solution Place 1 drop into both eyes 2 (two) times daily.     rivaroxaban  (XARELTO ) 20 MG TABS tablet Take 1 tablet (20 mg total) by mouth daily. 90 tablet 2   rosuvastatin  (CRESTOR ) 40 MG tablet Take 1 tablet (40 mg total) by mouth daily. 90 tablet 0   senna (SENOKOT) 8.6 MG TABS tablet Take 1 tablet by mouth in the morning and at bedtime.     potassium chloride  (KLOR-CON  M) 10 MEQ tablet Take 1 tablet (10 mEq total) by mouth daily. (Patient not taking: Reported on  01/12/2025) 7 tablet 0   No facility-administered medications prior to visit.    Allergies[1]     Objective:    BP (!) 160/80 (BP Location: Left Arm, Patient Position: Sitting, Cuff Size: Normal)   Pulse 62   Ht 5' 3 (1.6 m)   Wt 178 lb 6.4 oz (80.9 kg)   SpO2 99%   BMI 31.60 kg/m  BP Readings from Last 3 Encounters:  01/12/25 (!) 160/80  01/10/25 (!) 150/76  01/10/25 (!) 189/111   Wt Readings from Last 3 Encounters:  01/12/25 178 lb 6.4 oz (80.9 kg)  12/16/24 179 lb 8 oz (81.4 kg)  11/10/24 173 lb (78.5 kg)      Physical Exam Vitals and nursing note reviewed.  Constitutional:      Appearance: Normal appearance. She is normal weight.  HENT:     Head: Normocephalic and atraumatic.  Cardiovascular:     Rate and Rhythm: Normal rate and regular rhythm.     Pulses: Normal pulses.     Heart sounds: Normal heart sounds.  Pulmonary:     Effort: Pulmonary effort is normal.     Breath sounds: Normal breath sounds.  Skin:    General: Skin is warm and dry.  Neurological:     General: No focal deficit present.     Mental Status: She is alert and oriented to person, place, and time. Mental status is at baseline.  Psychiatric:        Mood and Affect: Mood normal.        Behavior: Behavior normal.        Thought Content: Thought content normal.        Judgment: Judgment normal.       No results found for any visits on 01/12/25.    Latest Ref Rng & Units 11/10/2024   12:39 PM 09/29/2024   12:25 AM 09/26/2024    6:10 AM  CMP  Glucose 65 - 99 mg/dL 824  877  76   BUN 7 - 25 mg/dL 11  8  6    Creatinine 0.60 - 1.00 mg/dL 9.06  8.97  9.15   Sodium 135 - 146 mmol/L 142  141  142   Potassium 3.5 - 5.3 mmol/L 3.4  3.6  4.3   Chloride 98 - 110 mmol/L 105  105  111   CO2 20 - 32 mmol/L 29  25  20    Calcium  8.6 - 10.4 mg/dL 9.9  9.7  8.9   Total Protein 6.1 - 8.1 g/dL 6.5     Total Bilirubin 0.2 - 1.2 mg/dL 0.6     AST 10 - 35 U/L 17     ALT 6 - 29 U/L 12           Assessment & Plan:   Problem List Items Addressed This  Visit       Cardiovascular and Mediastinum   HYPERTENSION, BENIGN SYSTEMIC - Primary (Chronic)   Relevant Medications   valsartan  (DIOVAN ) 80 MG tablet   Other Relevant Orders   Comprehensive metabolic panel with GFR     Endocrine   Controlled type 2 diabetes mellitus without complication, without long-term current use of insulin  (HCC)   Relevant Medications   valsartan  (DIOVAN ) 80 MG tablet   Other Relevant Orders   Hemoglobin A1c   Other Visit Diagnoses       Vitamin D  deficiency       Relevant Orders   VITAMIN D  25 Hydroxy (Vit-D Deficiency, Fractures)     At risk for medication noncompliance       Relevant Orders   AMB Referral VBCI Care Management     Transportation insecurity       Relevant Orders   AMB Referral VBCI Care Management       Assessment and Plan Assessment & Plan Essential hypertension Hypertension with recent elevated readings. Current management inadequate, requiring medication adjustment. - Restarted valsartan  80 mg daily. - Ordered CMP, A1c, Vitamin D . - Continue Metoprolol  50mg  daily. - Advised to avoid high-salt foods. - Recommend heart healthy diet such as Mediterranean diet with whole grains, fruits, vegetable, fish, lean meats, nuts, and olive oil. Limit salt. Encouraged moderate walking, 3-5 times/week for 30-50 minutes each session. Aim for at least 150 minutes.week. Avoid tobacco products. Avoid excess alcohol. Take medications as prescribed and bring medications and blood pressure log with cuff to each office visit. Seek medical care for chest pain, palpitations, shortness of breath with exertion, dizziness/lightheadedness, vision changes, recurrent headaches, or swelling of extremities. - Follow up in 4 weeks with repeat labs week prior.     Meds ordered this encounter  Medications   valsartan  (DIOVAN ) 80 MG tablet    Sig: Take 1 tablet (80 mg total) by mouth daily.     Dispense:  30 tablet    Refill:  0    Supervising Provider:   DUANNE LOWERS T [[id number]]    Return in about 4 weeks (around 02/09/2025) for chronic follow-up with labs 1 week prior.  Meredith GORMAN Barrio, FNP [facility]       [1]  Allergies Allergen Reactions   Lisinopril  Anaphylaxis, Shortness Of Breath and Swelling    Mouth and throat became swollen   Doxycycline  Swelling    Possibly caused swelling of lips and hives in May 2019, timeline unclear   Flexeril [Cyclobenzaprine Hcl] Hives and Itching   Lipitor [Atorvastatin Calcium ] Other (See Comments)    Myalgia   Metaxalone Hives and Itching        "

## 2025-01-12 NOTE — Progress Notes (Signed)
 Care Guide Pharmacy Note  01/12/2025 Name: [name] MRN: [id number] DOB: February 08, 1951  Referred By: Kayla Jeoffrey RAMAN, FNP Reason for referral: Complex Care Management (Outreach to schedule initial referral with PharmD)   Meredith [name] is a 74 y.o. year old female who is a primary care patient of Kayla Jeoffrey RAMAN, FNP.  MARLEAH BEEVER was referred to the pharmacist for assistance related to: medication noncompliance  Successful contact was made with the patient to discuss pharmacy services including being ready for the pharmacist to call at least 5 minutes before the scheduled appointment time and to have medication bottles and any blood pressure readings ready for review. The patient agreed to meet with the pharmacist via telephone visit on (date/time). 01/14/25   Debbe [facility]  [facility] Guide  Direct Dial: [phone]  Fax [phone]

## 2025-01-13 ENCOUNTER — Telehealth: Payer: Self-pay | Admitting: *Deleted

## 2025-01-13 ENCOUNTER — Telehealth: Payer: Self-pay

## 2025-01-13 ENCOUNTER — Other Ambulatory Visit: Payer: Self-pay

## 2025-01-13 ENCOUNTER — Ambulatory Visit

## 2025-01-13 DIAGNOSIS — E119 Type 2 diabetes mellitus without complications: Secondary | ICD-10-CM

## 2025-01-13 MED ORDER — INVOKANA 100 MG PO TABS: 100.0000 mg | ORAL_TABLET | Freq: Every day | ORAL | 1 refills | Status: DC

## 2025-01-13 NOTE — Telephone Encounter (Signed)
 Patient would like to know if she should take her valsartan  in the morning or at night. Pt unable to check her blood pressure d/t being out of batteries. States she does not have anyone that can help her get batteries until after the snow.  Copied from CRM [id number]. Topic: Clinical - Medication Question >> Jan 13, 2025  8:24 AM Macario HERO wrote: Reason for CRM: Patient called asking when she should take valsartan  (DIOVAN ) 80 MG tablet [[id number]] -- want's to know if she should take it this morning.

## 2025-01-13 NOTE — Progress Notes (Signed)
 Complex Care Management Note Care Guide Note  01/13/2025 Name: Meredith [name] MRN: [id number] DOB: 1951/08/04  Meredith [name] is a 74 y.o. year old female who is a primary care patient of Kayla Jeoffrey RAMAN, FNP . The community resource team was consulted for assistance with Transportation Needs , Food Insecurity, and Financial Difficulties related to many things not sure what is paid   SDOH screenings and interventions completed:  Yes  Social Drivers of Health From This Encounter   Food Insecurity: Food Insecurity Present (01/13/2025)   Epic    Worried About Programme Researcher, Broadcasting/film/video in the Last Year: Often true    Barista in the Last Year: Often true  Financial Resource Strain: High Risk (01/13/2025)   Overall Financial Resource Strain (CARDIA)    Difficulty of Paying Living Expenses: Very hard  Transportation Needs: Unmet Transportation Needs (01/13/2025)   Epic    Lack of Transportation (Medical): No    Lack of Transportation (Non-Medical): Yes    SDOH Interventions Today    Flowsheet Row Most Recent Value  SDOH Interventions   Food Insecurity Interventions Community Resources Provided  [Told patient i could provide food banks but patient has no way to get to thte food banks stated she had spoiled milk one egg and porkchops , will mail her information after advised by LCSW]  Transportation Interventions [employer] (Specialized Community [employer]), [employer] Provided  [patient says car not working culd possibly advise community transportation but would need to ascertain her county and services could possibly include medical only]  Financial Strain Interventions Other (Comment)  [Patient has no family health and safety inspector and is in need of support in finances and also food and security , no treansportation to get food . Also pill worrys about being able to take blood pressure pill .]  Stress Interventions Other (Comment)  [Patient was cery stressed and tearful , needing support in many  areas , including food and money not sure how much she gets or if she could qualify for food stamps offered to help said someone told her she got food stamps but she says she does not .]  Health Literacy Interventions Intervention Not Indicated    e is your narrative-style note, clear, professional, and ready for charting:  Narrative Note - SDOH Interventions Today, the patient appeared very tearful, overwhelmed, and somewhat confused during our conversation. She disconnected from the call unexpectedly. After this occurred, I notified Care Guide leadership, who attempted additional outreach. I also informed the patients PCP office and my Care Guide Manager to update them on the situation and ensure appropriate follow-up. During the discussion, the patient expressed significant difficulty meeting basic needs. She shared that she currently has very limited food at home--spoiled milk, one egg, and pork chops--and stated she has no means of transportation to access local food banks. I offered to provide her with information on community food resources, and per guidance from the LCSW, I will mail this information to her since she cannot travel to these locations. The patient reported that her car is not working. I discussed potential community transportation options, including [employer] or other county-based medical transportation services, though additional assessment will be needed to determine her eligibility and available programs based on her location. She also reported having no family assistance and significant financial strain. She is struggling with obtaining food, transportation, and maintaining overall security. She expressed concern about taking her blood pressure medication, given her current circumstances and she had reached  out to pcp for clarification of that medication . The patient was extremely stressed and tearful throughout the call. She stated she is unsure of her monthly income and uncertain  whether she receives food stamps. She mentioned someone previously told her she had SNAP benefits, but she does not believe she is receiving them. I offered support with exploring eligibility and assisting with the application process if needed.  Care guide performed the following interventions: Patient provided with information about care guide support team and interviewed to confirm resource needs.  Follow Up Plan:  Care guide will outreach resources to assist patient with SDOH after patient being assessed by other providers   Encounter Outcome:  Patient Visit Completed  Castulo Scarpelli [facility]  [facility] HealthPopulation Health Care Guide  Direct Dial:[phone] Fax:[phone] Website: [email or web address]

## 2025-01-13 NOTE — Telephone Encounter (Signed)
 Received word from Resource Care Guide, Jeanine Greenauer-Moran, Patient SDOH referral that one of our Resource Care Guides reached out to this morning who was very upset and disoriented. States she doesn't have much food at her house, then got really upset and confused about new blood pressure medication. States she doesn't have a working vehicle to get to food. Didnt want to get help with transportation. Was upset that PCP hasn't called back about meds. States she has no family or friends because 'you cant trust anyone because they will take things from you'. Our CRCG states patient seemed very agitated and upset and then patient hung up. Staci Ashworth, CMA has called patient to reach out and patient will pick up the phone and then hang up. Concerned about patients wellbeing. Patient was crying and hyperventilating on the phone. Called non-emergent police to do a wellness check on patient. They are going to call back once they have connected with patient.

## 2025-01-13 NOTE — Progress Notes (Signed)
 Complex Care Management Note Care Guide Note  01/13/2025 Name: JIRAH RIDER MRN: [id number] DOB: 22-Mar-1951  Meredith [name] is a 74 y.o. year old female who is a primary care patient of Kayla Jeoffrey RAMAN, FNP . The community resource team was consulted for assistance with Transportation Needs  and Food Insecurity  SDOH screenings and interventions completed:  Yes    The patient reported that the police completed a wellness check at her home, and she stated that she is feeling much better at this time. She also shared that her primary care provider contacted her and gave clear instructions on how and when to take her blood pressure medication. During our discussion, we further reviewed her ongoing needs related to food insecurity, housing concerns, and lack of transportation. The patient also reported that she is expecting a significantly high utility bill, which is adding to her stress. I informed the patient that I will mail her a packet containing community resource information, including programs that may assist with food access, utilities, transportation, and housing support. I will also include my contact information so she can reach out if additional needs arise. Social Work has been notified and will be reaching out to the patient for continued support and follow-up.     Care guide performed the following interventions: Patient provided with information about care guide support team and interviewed to confirm resource needs.  Follow Up Plan:  No further follow up planned at this time. The patient has been provided with needed resources.  Encounter Outcome:  Patient Visit Completed  Wells Gerdeman Greenauer-Moran  [facility] HealthPopulation Health Care Guide  Direct Dial:[phone] Fax:[phone] Website: [email or web address]

## 2025-01-13 NOTE — Addendum Note (Signed)
 Addended by: ULLA KARNA LABOR on: 01/13/2025 08:35 AM   Modules accepted: Orders

## 2025-01-13 NOTE — Telephone Encounter (Signed)
 Deputy Mannie called back and said she was safe at home and safe to self.   I called patient and got her on the phone and she was calmed down-but was very scared. She states I dont want to be a bother and dont want nobody upset with me I talked to her about the Valsartan  and she is going to take it once daily in the morning. We talked about SDOH needs and after some convincing (insuring her we didnt want anything from her but we wanted to help her) she was agreeable to talk to Jeanine again. Call was transferred to Jeanine for SDOH needs.

## 2025-01-14 ENCOUNTER — Other Ambulatory Visit: Payer: Self-pay

## 2025-01-14 ENCOUNTER — Telehealth: Payer: Self-pay

## 2025-01-14 NOTE — Progress Notes (Signed)
" °  01/14/2025 Name: Meredith [name] MRN: [id number] DOB: [birth year]  Chief Complaint  Patient presents with   Medication Management    Patient showed up in office for scheduled telephone visit. Called patient to RS next Monday. Understands it will be a telephone call.   Future Appointments  Date Time Provider Department Center  01/17/2025  2:00 PM Pandora Cadet, [facility] None  01/24/2025 11:00 AM Sherren Olam BRAVO, LCSW [facility] None  01/27/2025 11:00 AM Craig Alan JONELLE DEVONNA [facility]  01/28/2025 11:00 AM Little, Clayborne CROME, RN [facility] None  02/02/2025 10:00 AM [facility]  02/09/2025 11:30 AM [facility]-ANNUAL WELLNESS VISIT [facility]  02/09/2025 11:30 AM Kayla Jeoffrey RAMAN, FNP [facility]     Cadet Pandora, PharmD, BCGP Clinical Pharmacist  [phone]  "

## 2025-01-17 ENCOUNTER — Other Ambulatory Visit: Payer: Self-pay

## 2025-01-21 ENCOUNTER — Telehealth: Admitting: *Deleted

## 2025-01-24 ENCOUNTER — Telehealth

## 2025-01-27 ENCOUNTER — Ambulatory Visit: Admitting: Physician Assistant

## 2025-01-28 ENCOUNTER — Telehealth

## 2025-02-02 ENCOUNTER — Other Ambulatory Visit

## 2025-02-09 ENCOUNTER — Ambulatory Visit

## 2025-02-09 ENCOUNTER — Ambulatory Visit: Admitting: Family Medicine
